# Patient Record
Sex: Female | Born: 1970
Health system: Southern US, Community
[De-identification: ages and names within clinical notes are randomized; demographics above are authoritative.]

## PROBLEM LIST (undated history)

## (undated) DIAGNOSIS — D649 Anemia, unspecified: Secondary | ICD-10-CM

## (undated) DIAGNOSIS — R51 Headache: Secondary | ICD-10-CM

## (undated) DIAGNOSIS — J309 Allergic rhinitis, unspecified: Secondary | ICD-10-CM

## (undated) DIAGNOSIS — I2699 Other pulmonary embolism without acute cor pulmonale: Secondary | ICD-10-CM

## (undated) DIAGNOSIS — L910 Hypertrophic scar: Secondary | ICD-10-CM

## (undated) DIAGNOSIS — J45909 Unspecified asthma, uncomplicated: Secondary | ICD-10-CM

## (undated) DIAGNOSIS — E538 Deficiency of other specified B group vitamins: Secondary | ICD-10-CM

## (undated) DIAGNOSIS — L259 Unspecified contact dermatitis, unspecified cause: Secondary | ICD-10-CM

## (undated) DIAGNOSIS — I82409 Acute embolism and thrombosis of unspecified deep veins of unspecified lower extremity: Secondary | ICD-10-CM

## (undated) DIAGNOSIS — E785 Hyperlipidemia, unspecified: Secondary | ICD-10-CM

## (undated) HISTORY — DX: Hyperlipidemia, unspecified: E78.5

## (undated) HISTORY — DX: Hypertrophic scar: L91.0

## (undated) HISTORY — DX: Deficiency of other specified B group vitamins: E53.8

## (undated) HISTORY — DX: Unspecified contact dermatitis, unspecified cause: L25.9

## (undated) HISTORY — DX: Allergic rhinitis, unspecified: J30.9

## (undated) HISTORY — PX: KELOID EXCISION: SHX1856

## (undated) HISTORY — DX: Unspecified asthma, uncomplicated: J45.909

## (undated) HISTORY — DX: Headache: R51

---
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
## 1998-05-06 ENCOUNTER — Inpatient Hospital Stay (HOSPITAL_COMMUNITY): Admission: AD | Admit: 1998-05-06 | Discharge: 1998-05-06 | Payer: Self-pay | Admitting: Obstetrics & Gynecology

## 1998-06-10 ENCOUNTER — Encounter: Payer: Self-pay | Admitting: Internal Medicine

## 1998-06-10 ENCOUNTER — Emergency Department (HOSPITAL_COMMUNITY): Admission: EM | Admit: 1998-06-10 | Discharge: 1998-06-10 | Payer: Self-pay | Admitting: Internal Medicine

## 1998-06-24 ENCOUNTER — Encounter: Admission: RE | Admit: 1998-06-24 | Discharge: 1998-07-20 | Payer: Self-pay | Admitting: *Deleted

## 1998-11-17 ENCOUNTER — Inpatient Hospital Stay (HOSPITAL_COMMUNITY): Admission: AD | Admit: 1998-11-17 | Discharge: 1998-11-17 | Payer: Self-pay | Admitting: Obstetrics

## 1998-11-17 ENCOUNTER — Encounter: Payer: Self-pay | Admitting: Obstetrics

## 1998-12-31 ENCOUNTER — Inpatient Hospital Stay (HOSPITAL_COMMUNITY): Admission: AD | Admit: 1998-12-31 | Discharge: 1998-12-31 | Payer: Self-pay | Admitting: Obstetrics and Gynecology

## 1999-01-03 DIAGNOSIS — R87810 Cervical high risk human papillomavirus (HPV) DNA test positive: Secondary | ICD-10-CM | POA: Insufficient documentation

## 1999-01-17 ENCOUNTER — Other Ambulatory Visit: Admission: RE | Admit: 1999-01-17 | Discharge: 1999-01-17 | Payer: Self-pay | Admitting: Obstetrics and Gynecology

## 1999-02-14 ENCOUNTER — Encounter: Payer: Self-pay | Admitting: Obstetrics and Gynecology

## 1999-02-14 ENCOUNTER — Ambulatory Visit (HOSPITAL_COMMUNITY): Admission: RE | Admit: 1999-02-14 | Discharge: 1999-02-14 | Payer: Self-pay | Admitting: Obstetrics and Gynecology

## 1999-04-25 ENCOUNTER — Other Ambulatory Visit: Admission: RE | Admit: 1999-04-25 | Discharge: 1999-04-25 | Payer: Self-pay | Admitting: Obstetrics and Gynecology

## 1999-05-13 ENCOUNTER — Inpatient Hospital Stay (HOSPITAL_COMMUNITY): Admission: AD | Admit: 1999-05-13 | Discharge: 1999-05-13 | Payer: Self-pay | Admitting: Obstetrics and Gynecology

## 1999-06-15 ENCOUNTER — Inpatient Hospital Stay (HOSPITAL_COMMUNITY): Admission: AD | Admit: 1999-06-15 | Discharge: 1999-06-15 | Payer: Self-pay | Admitting: Obstetrics and Gynecology

## 1999-06-23 ENCOUNTER — Inpatient Hospital Stay (HOSPITAL_COMMUNITY): Admission: AD | Admit: 1999-06-23 | Discharge: 1999-06-26 | Payer: Self-pay | Admitting: Obstetrics and Gynecology

## 1999-08-08 ENCOUNTER — Other Ambulatory Visit: Admission: RE | Admit: 1999-08-08 | Discharge: 1999-08-08 | Payer: Self-pay | Admitting: Obstetrics and Gynecology

## 2000-07-12 ENCOUNTER — Encounter: Payer: Self-pay | Admitting: Obstetrics & Gynecology

## 2000-07-12 ENCOUNTER — Inpatient Hospital Stay (HOSPITAL_COMMUNITY): Admission: AD | Admit: 2000-07-12 | Discharge: 2000-07-12 | Payer: Self-pay | Admitting: Obstetrics and Gynecology

## 2000-09-12 ENCOUNTER — Other Ambulatory Visit: Admission: RE | Admit: 2000-09-12 | Discharge: 2000-09-12 | Payer: Self-pay | Admitting: Obstetrics and Gynecology

## 2000-09-20 ENCOUNTER — Encounter: Payer: Self-pay | Admitting: Obstetrics and Gynecology

## 2000-09-20 ENCOUNTER — Ambulatory Visit (HOSPITAL_COMMUNITY): Admission: RE | Admit: 2000-09-20 | Discharge: 2000-09-20 | Payer: Self-pay | Admitting: Obstetrics and Gynecology

## 2001-01-25 ENCOUNTER — Inpatient Hospital Stay (HOSPITAL_COMMUNITY): Admission: AD | Admit: 2001-01-25 | Discharge: 2001-01-28 | Payer: Self-pay | Admitting: Obstetrics and Gynecology

## 2001-04-28 ENCOUNTER — Ambulatory Visit (HOSPITAL_BASED_OUTPATIENT_CLINIC_OR_DEPARTMENT_OTHER): Admission: RE | Admit: 2001-04-28 | Discharge: 2001-04-28 | Payer: Self-pay | Admitting: Internal Medicine

## 2001-10-03 ENCOUNTER — Emergency Department (HOSPITAL_COMMUNITY): Admission: EM | Admit: 2001-10-03 | Discharge: 2001-10-04 | Payer: Self-pay | Admitting: Emergency Medicine

## 2001-10-03 ENCOUNTER — Encounter: Payer: Self-pay | Admitting: Emergency Medicine

## 2002-03-08 ENCOUNTER — Emergency Department (HOSPITAL_COMMUNITY): Admission: EM | Admit: 2002-03-08 | Discharge: 2002-03-08 | Payer: Self-pay | Admitting: Emergency Medicine

## 2002-03-08 ENCOUNTER — Encounter: Payer: Self-pay | Admitting: Emergency Medicine

## 2002-03-09 ENCOUNTER — Emergency Department (HOSPITAL_COMMUNITY): Admission: EM | Admit: 2002-03-09 | Discharge: 2002-03-09 | Payer: Self-pay | Admitting: Emergency Medicine

## 2002-03-12 ENCOUNTER — Encounter (HOSPITAL_BASED_OUTPATIENT_CLINIC_OR_DEPARTMENT_OTHER): Admission: RE | Admit: 2002-03-12 | Discharge: 2002-03-28 | Payer: Self-pay | Admitting: Orthopedic Surgery

## 2002-07-27 ENCOUNTER — Emergency Department (HOSPITAL_COMMUNITY): Admission: EM | Admit: 2002-07-27 | Discharge: 2002-07-27 | Payer: Self-pay | Admitting: Emergency Medicine

## 2003-02-07 ENCOUNTER — Emergency Department (HOSPITAL_COMMUNITY): Admission: EM | Admit: 2003-02-07 | Discharge: 2003-02-07 | Payer: Self-pay | Admitting: Emergency Medicine

## 2003-07-27 ENCOUNTER — Emergency Department (HOSPITAL_COMMUNITY): Admission: EM | Admit: 2003-07-27 | Discharge: 2003-07-27 | Payer: Self-pay | Admitting: Emergency Medicine

## 2003-07-31 ENCOUNTER — Ambulatory Visit (HOSPITAL_COMMUNITY): Admission: RE | Admit: 2003-07-31 | Discharge: 2003-07-31 | Payer: Self-pay | Admitting: Internal Medicine

## 2003-11-03 ENCOUNTER — Ambulatory Visit: Payer: Self-pay | Admitting: Internal Medicine

## 2004-11-18 ENCOUNTER — Emergency Department (HOSPITAL_COMMUNITY): Admission: EM | Admit: 2004-11-18 | Discharge: 2004-11-18 | Payer: Self-pay | Admitting: Emergency Medicine

## 2004-11-22 ENCOUNTER — Ambulatory Visit: Payer: Self-pay | Admitting: Internal Medicine

## 2005-01-18 ENCOUNTER — Ambulatory Visit: Payer: Self-pay | Admitting: Internal Medicine

## 2007-09-24 ENCOUNTER — Inpatient Hospital Stay (HOSPITAL_COMMUNITY): Admission: AD | Admit: 2007-09-24 | Discharge: 2007-09-24 | Payer: Self-pay | Admitting: Obstetrics and Gynecology

## 2007-10-30 ENCOUNTER — Ambulatory Visit: Payer: Self-pay | Admitting: Obstetrics and Gynecology

## 2007-10-30 ENCOUNTER — Encounter: Payer: Self-pay | Admitting: Obstetrics and Gynecology

## 2007-10-30 LAB — CONVERTED CEMR LAB: Hep B S Ab: NEGATIVE

## 2007-11-04 ENCOUNTER — Ambulatory Visit (HOSPITAL_COMMUNITY): Admission: RE | Admit: 2007-11-04 | Discharge: 2007-11-04 | Payer: Self-pay | Admitting: Obstetrics and Gynecology

## 2008-07-11 ENCOUNTER — Inpatient Hospital Stay (HOSPITAL_COMMUNITY): Admission: AD | Admit: 2008-07-11 | Discharge: 2008-07-12 | Payer: Self-pay | Admitting: Obstetrics and Gynecology

## 2008-11-23 ENCOUNTER — Inpatient Hospital Stay (HOSPITAL_COMMUNITY): Admission: AD | Admit: 2008-11-23 | Discharge: 2008-11-25 | Payer: Self-pay | Admitting: Obstetrics and Gynecology

## 2008-12-02 ENCOUNTER — Ambulatory Visit: Payer: Self-pay | Admitting: Vascular Surgery

## 2009-03-17 ENCOUNTER — Ambulatory Visit: Payer: Self-pay | Admitting: Internal Medicine

## 2009-03-17 DIAGNOSIS — R5381 Other malaise: Secondary | ICD-10-CM | POA: Insufficient documentation

## 2009-03-17 DIAGNOSIS — L259 Unspecified contact dermatitis, unspecified cause: Secondary | ICD-10-CM

## 2009-03-17 DIAGNOSIS — M5137 Other intervertebral disc degeneration, lumbosacral region: Secondary | ICD-10-CM | POA: Insufficient documentation

## 2009-03-17 DIAGNOSIS — R5383 Other fatigue: Secondary | ICD-10-CM

## 2009-03-17 DIAGNOSIS — E785 Hyperlipidemia, unspecified: Secondary | ICD-10-CM

## 2009-03-17 DIAGNOSIS — J3089 Other allergic rhinitis: Secondary | ICD-10-CM | POA: Insufficient documentation

## 2009-03-17 DIAGNOSIS — J45909 Unspecified asthma, uncomplicated: Secondary | ICD-10-CM | POA: Insufficient documentation

## 2009-03-17 DIAGNOSIS — D649 Anemia, unspecified: Secondary | ICD-10-CM | POA: Insufficient documentation

## 2009-03-17 DIAGNOSIS — R030 Elevated blood-pressure reading, without diagnosis of hypertension: Secondary | ICD-10-CM | POA: Insufficient documentation

## 2009-03-17 DIAGNOSIS — J309 Allergic rhinitis, unspecified: Secondary | ICD-10-CM

## 2009-03-17 DIAGNOSIS — R42 Dizziness and giddiness: Secondary | ICD-10-CM | POA: Insufficient documentation

## 2009-03-17 HISTORY — DX: Hyperlipidemia, unspecified: E78.5

## 2009-03-17 HISTORY — DX: Unspecified contact dermatitis, unspecified cause: L25.9

## 2009-03-17 HISTORY — DX: Unspecified asthma, uncomplicated: J45.909

## 2009-03-17 HISTORY — DX: Allergic rhinitis, unspecified: J30.9

## 2009-03-18 DIAGNOSIS — E538 Deficiency of other specified B group vitamins: Secondary | ICD-10-CM | POA: Insufficient documentation

## 2009-03-18 HISTORY — DX: Deficiency of other specified B group vitamins: E53.8

## 2009-03-18 LAB — CONVERTED CEMR LAB
ALT: 13 units/L (ref 0–35)
Alkaline Phosphatase: 57 units/L (ref 39–117)
Basophils Absolute: 0 10*3/uL (ref 0.0–0.1)
Basophils Relative: 0.8 % (ref 0.0–3.0)
Bilirubin Urine: NEGATIVE
Chloride: 107 meq/L (ref 96–112)
Creatinine, Ser: 0.8 mg/dL (ref 0.4–1.2)
Eosinophils Absolute: 0.2 10*3/uL (ref 0.0–0.7)
GFR calc non Af Amer: 103.14 mL/min (ref >60)
HCT: 36.7 % (ref 36.0–46.0)
Hemoglobin, Urine: NEGATIVE
Hemoglobin: 11.9 g/dL — ABNORMAL LOW (ref 12.0–15.0)
Iron: 51 ug/dL (ref 42–145)
Ketones, ur: NEGATIVE mg/dL
Lymphs Abs: 2.3 10*3/uL (ref 0.7–4.0)
MCHC: 32.5 g/dL (ref 30.0–36.0)
MCV: 90.4 fL (ref 78.0–100.0)
Neutro Abs: 2.1 10*3/uL (ref 1.4–7.7)
Platelets: 177 10*3/uL (ref 150.0–400.0)
Potassium: 3.9 meq/L (ref 3.5–5.1)
RBC: 4.06 M/uL (ref 3.87–5.11)
Saturation Ratios: 17.2 % — ABNORMAL LOW (ref 20.0–50.0)
Sodium: 140 meq/L (ref 135–145)
TSH: 1.38 microintl units/mL (ref 0.35–5.50)
Transferrin: 212.4 mg/dL (ref 212.0–360.0)
Urine Glucose: NEGATIVE mg/dL

## 2009-03-22 ENCOUNTER — Ambulatory Visit: Payer: Self-pay | Admitting: Internal Medicine

## 2009-04-05 ENCOUNTER — Ambulatory Visit: Payer: Self-pay | Admitting: Internal Medicine

## 2009-04-19 ENCOUNTER — Ambulatory Visit: Payer: Self-pay | Admitting: Internal Medicine

## 2009-06-02 ENCOUNTER — Ambulatory Visit: Payer: Self-pay | Admitting: Internal Medicine

## 2009-06-16 ENCOUNTER — Ambulatory Visit: Payer: Self-pay | Admitting: Internal Medicine

## 2009-06-21 ENCOUNTER — Ambulatory Visit: Payer: Self-pay | Admitting: Internal Medicine

## 2009-06-21 DIAGNOSIS — R51 Headache: Secondary | ICD-10-CM

## 2009-06-21 DIAGNOSIS — R519 Headache, unspecified: Secondary | ICD-10-CM | POA: Insufficient documentation

## 2009-06-21 DIAGNOSIS — L989 Disorder of the skin and subcutaneous tissue, unspecified: Secondary | ICD-10-CM | POA: Insufficient documentation

## 2009-06-21 DIAGNOSIS — L910 Hypertrophic scar: Secondary | ICD-10-CM

## 2009-06-21 DIAGNOSIS — J069 Acute upper respiratory infection, unspecified: Secondary | ICD-10-CM | POA: Insufficient documentation

## 2009-06-21 HISTORY — DX: Headache: R51

## 2009-06-21 HISTORY — DX: Hypertrophic scar: L91.0

## 2009-06-24 ENCOUNTER — Encounter (INDEPENDENT_AMBULATORY_CARE_PROVIDER_SITE_OTHER): Payer: Self-pay | Admitting: *Deleted

## 2009-09-01 ENCOUNTER — Encounter: Payer: Self-pay | Admitting: Internal Medicine

## 2009-10-06 ENCOUNTER — Encounter: Payer: Self-pay | Admitting: Internal Medicine

## 2009-10-06 ENCOUNTER — Ambulatory Visit: Payer: Self-pay | Admitting: Internal Medicine

## 2009-10-18 ENCOUNTER — Ambulatory Visit: Payer: Self-pay | Admitting: Internal Medicine

## 2009-10-22 ENCOUNTER — Telehealth: Payer: Self-pay | Admitting: Internal Medicine

## 2009-11-15 ENCOUNTER — Telehealth: Payer: Self-pay | Admitting: Internal Medicine

## 2010-01-22 ENCOUNTER — Encounter: Payer: Self-pay | Admitting: Internal Medicine

## 2010-01-23 ENCOUNTER — Encounter: Payer: Self-pay | Admitting: *Deleted

## 2010-02-01 NOTE — Progress Notes (Signed)
  Phone Note Call from Patient Call back at Work Phone [phone]   Caller: Patient Call For: Corwin Levins MD Summary of Call: Pt stated that her insurance denied the MRI and she was wondering what is the next step or what else Dr. Jonny Ruiz can do about this? Please give pt a call.  Initial call taken by: Livingston Diones,  November 15, 2009 3:38 PM  Follow-up for Phone Call        I can refer to neurology if she wants? Follow-up by: Corwin Levins MD,  November 15, 2009 3:44 PM  Additional Follow-up for Phone Call Additional follow up Details #1::        Pt states that she is still experiencing head pain and would like referral to Neuro. Pt informed she will be contacted by [facility] with appt info Additional Follow-up by: Margaret Pyle, CMA,  November 16, 2009 8:35 AM

## 2010-02-01 NOTE — Assessment & Plan Note (Signed)
Summary: sharp pains in front of head-lb   Vital Signs:  Patient profile:   40 year old female Height:      67 inches Weight:      252.75 pounds BMI:     39.73 O2 Sat:      98 % on Room air Temp:     97.6 degrees F oral Pulse rate:   79 / minute BP sitting:   120 / 80  (left arm) Cuff size:   large  Vitals Entered By: Zella Ball Ewing CMA (AAMA) (October 06, 2009 10:29 AM)  O2 Flow:  Room air CC: Sharp head pain/RE   CC:  Sharp head pain/RE.  History of Present Illness: here to f/u - unfort has gained abou 16 lbs since last visit;  c/o 3 wks, mod to severe intermittent left forehead pain, happens about twice per day, lasts few seconds but really bothers her;  no bluurred vision but does have some dizziness with it,  no photophobia, n/v, phonophobia, ;  seems to not be related to exertion;  no syncope or falls , or injury. Pt denies new neuro symptoms such as other headache, facial or extremity weakness .  Pt denies CP, worsening sob, doe, wheezing, orthopnea, pnd, worsening LE edema, palps, dizziness or syncope  No ST , cough.  No fever, wt loss, night sweats, loss of appetite or other constitutional symptoms  Does also have signficant fatigue for approx 4 wks, but denies daytime somnolence.  Denies depressivve symtpoms, or suicidal  ideiaton. no panic.  Due for b12 shot today. No vert bleeding or bruising, no menorrhagia,  has IUD - should not be pregnant.  Only 2 menses since partuition - 10 mo.  Does have GYN but not seen since 6 wk checkup, except for the IUD.  Problems Prior to Update: 1)  Headache  (ICD-784.0) 2)  Skin Lesion  (ICD-709.9) 3)  Keloid  (ICD-701.4) 4)  Dizziness  (ICD-780.4) 5)  Uri  (ICD-465.9) 6)  Vitamin B12 Deficiency  (ICD-266.2) 7)  Elevated Blood Pressure Without Diagnosis of Hypertension  (ICD-796.2) 8)  Disc Disease, Lumbar  (ICD-722.52) 9)  Hyperlipidemia  (ICD-272.4) 10)  Eczema  (ICD-692.9) 11)  Asthma  (ICD-493.90) 12)  Allergic Rhinitis   (ICD-477.9) 13)  Fatigue  (ICD-780.79) 14)  Dizziness  (ICD-780.4) 15)  Anemia-nos  (ICD-285.9)  Medications Prior to Update: 1)  Cyanocobalamin 1000 Mcg/ml Soln (Cyanocobalamin) .Marland Kitchen.. 1 Cc Im Q Month 2)  Triamcinolone Acetonide 0.1 % Crea (Triamcinolone Acetonide) .... Use Asd Once Daily As Needed 3)  Azithromycin 250 Mg Tabs (Azithromycin) .... 2po Qd For 1 Day, Then 1po Qd For 4days, Then Stop 4)  Meclizine Hcl 12.5 Mg Tabs (Meclizine Hcl) .Marland Kitchen.. 1po Q 6 Hrs As Needed Dizziness  Current Medications (verified): 1)  Cyanocobalamin 1000 Mcg/ml Soln (Cyanocobalamin) .Marland Kitchen.. 1 Cc Im Q Month 2)  Fexofenadine Hcl 180 Mg Tabs (Fexofenadine Hcl) .Marland Kitchen.. 1po Once Daily As Needed Allergies  Allergies (verified): 1)  ! 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   Head:  normocephalic and atraumatic.   Eyes:  vision grossly intact, pupils equal, and pupils round.   Ears:  bilat tm's with mild erythema., non bulging, canals clear Nose:  nasal dischargemucosal pallor and mucosal edema.   Mouth:  pharyngeal erythema and fair dentition.   Neck:  supple and no masses.   Lungs:  normal respiratory effort and normal breath sounds.   Heart:  normal rate and regular rhythm.   Abdomen:  soft, non-tender, and normal bowel sounds.   Msk:  no joint tenderness and no joint swelling.   Extremities:  no edema, no erythema  Neurologic:  cranial nerves II-XII  intact, strength normal in all extremities, sensation intact to light touch, and gait normal.   Skin:  color normal and no rashes.   Psych:  ? mild dysphoric, mild anxious   Impression & Recommendations:  Problem # 1:  HEADACHE (ICD-784.0) hx not overly concerning given the very limited symptom duration, exam o/w benign, prob tension type HA/MSK  - ok for tylenol as needed   Problem # 2:  ALLERGIC RHINITIS (ICD-477.9)  Her updated medication list for this problem includes:    Fexofenadine Hcl 180 Mg Tabs (Fexofenadine hcl) .Marland Kitchen... 1po once daily as needed allergies treat as above, f/u any worsening signs or symptoms   Problem # 3:  FATIGUE (ICD-780.79)  for ecg today, labs from last visit reveiwed;  exam benign, and no symtpoms or OSA or depression  - ok to follow for now  Orders: EKG w/ Interpretation (93000)  Problem # 4:  VITAMIN B12 DEFICIENCY (ICD-266.2)  for  b12 IM today per routine  Orders: Vit B12 1000 mcg (J3420) Admin of Therapeutic Inj  intramuscular or subcutaneous (78295)  Complete Medication List: 1)  Cyanocobalamin 1000 Mcg/ml Soln (Cyanocobalamin) .Marland Kitchen.. 1 cc im q month 2)  Fexofenadine Hcl 180 Mg Tabs (Fexofenadine hcl) .Marland Kitchen.. 1po once daily as needed allergies  Patient Instructions: 1)  please tylenol for pain 2)  Please take all new medications as prescribed - the generic allegra for the allergies 3)  Your EKG was OK today 4)  You had the B12 shot today 5)  Continue all previous medications as before this visit  6)  Please schedule a follow-up appointment in 6 months, or sooner if needed Prescriptions: FEXOFENADINE HCL 180 MG TABS (FEXOFENADINE HCL) 1po once daily as needed allergies  #30 x 11   Entered and Authorized by:   Corwin Levins MD   Signed by:   Corwin Levins MD on 10/06/2009   Method used:   Print then Give to Patient   RxID:   [id number]    Medication Administration  Injection # 1:    Medication: Vit B12 1000 mcg    Diagnosis:  VITAMIN B12 DEFICIENCY (ICD-266.2)    Route: IM    Site: R deltoid    Exp Date: 07/2011    Lot #: [id number]    Mfr: American Regent    Patient tolerated injection without complications    Given by: Zella Ball Ewing CMA (AAMA) (October 06, 2009 11:02 AM)  Orders Added: 1)  EKG w/ Interpretation [93000] 2)  Vit B12 1000 mcg [J3420] 3)  Admin of Therapeutic Inj  intramuscular or subcutaneous [96372] 4)  Est. Patient Level IV [52841]

## 2010-02-01 NOTE — Consult Note (Signed)
Summary: [facility]   Imported By: Sherian Rein 09/23/2009 09:56:33  _____________________________________________________________________  External Attachment:    Type:   Image     Comment:   External Document

## 2010-02-01 NOTE — Assessment & Plan Note (Signed)
Summary: SHARP PAIN IN HEAD STILL/NWS   Vital Signs:  Patient profile:   40 year old female Height:      67 inches Weight:      249.38 pounds BMI:     39.20 O2 Sat:      98 % on Room air Temp:     98.3 degrees F oral Pulse rate:   56 / minute BP sitting:   100 / 68  (left arm) Cuff size:   large  Vitals Entered By: Zella Ball Ewing CMA Duncan Dull) (October 18, 2009 3:39 PM)  O2 Flow:  Room air CC: Sharp pain in head/RE   CC:  Sharp pain in head/RE.  History of Present Illness: here with persistent pain to left frontal head area as per last visit, but now more freq, more severe, now debilitating, makes her stop functioning well for minutes, severe episode 3 days ago, but still mod to severe last 3 days;  with some blurred vision ; now feels like a "lightening bolt" when flares hit her;  had hydrocodone 10's that did not help in the last 3 days that she had left over from a dental procedure.;  no fever, fall, injury or other new specific neuro symtpoms such as facial or extremity weakness.  No dizziness or syncope.  Pt denies CP, worsening sob, doe, wheezing, orthopnea, pnd, worsening LE edema, palps, dizziness or syncope No fever, wt loss, night sweats, loss of appetite or other constitutional symptoms .  No aggrev or alleviating facotr except ? worse to jerk the head to the left?  Problems Prior to Update: 1)  Headache  (ICD-784.0) 2)  Skin Lesion  (ICD-709.9) 3)  Keloid  (ICD-701.4) 4)  Dizziness  (ICD-780.4) 5)  Uri  (ICD-465.9) 6)  Vitamin B12 Deficiency  (ICD-266.2) 7)  Elevated Blood Pressure Without Diagnosis of Hypertension  (ICD-796.2) 8)  Disc Disease, Lumbar  (ICD-722.52) 9)  Hyperlipidemia  (ICD-272.4) 10)  Eczema  (ICD-692.9) 11)  Asthma  (ICD-493.90) 12)  Allergic Rhinitis  (ICD-477.9) 13)  Fatigue  (ICD-780.79) 14)  Dizziness  (ICD-780.4) 15)  Anemia-nos  (ICD-285.9)  Medications Prior to Update: 1)  Cyanocobalamin 1000 Mcg/ml Soln (Cyanocobalamin) .Marland Kitchen.. 1 Cc Im Q  Month 2)  Fexofenadine Hcl 180 Mg Tabs (Fexofenadine Hcl) .Marland Kitchen.. 1po Once Daily As Needed Allergies  Current Medications (verified): 1)  Cyanocobalamin 1000 Mcg/ml Soln (Cyanocobalamin) .Marland Kitchen.. 1 Cc Im Q Month 2)  Fexofenadine Hcl 180 Mg Tabs (Fexofenadine Hcl) .Marland Kitchen.. 1po Once Daily As Needed Allergies 3)  Flexeril 5 Mg Tabs (Cyclobenzaprine Hcl) .Marland Kitchen.. 1 By Mouth Three Times A Day As Needed  Allergies (verified): 1)  ! 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   Head:  normocephalic and atraumatic.  , nontender scalp, forehead and temple Eyes:  vision grossly intact, pupils equal, and pupils round.   Ears:  R ear normal and L ear normal.   Nose:  no external deformity and no nasal discharge.   Mouth:  no gingival  abnormalities and pharynx pink and moist.   Neck:  supple and no masses.   Lungs:  normal respiratory effort and normal breath sounds.   Heart:  normal rate and regular rhythm.   Extremities:  no edema, no erythema  Neurologic:  alert & oriented X3, cranial nerves II-XII intact, strength normal in all extremities, sensation intact to light touch, and gait normal.     Impression & Recommendations:  Problem # 1:  HEADACHE (ICD-784.0)  overall much more freq and severe, left head/forehead area, now debilitating when pain occurs, with some blurred vision;  different from her usual migraine and not responsive to the hydrocodone 10  high dose - ? sentinel headache with aneurysm - for MRI/MRA, also r/o tumor  Orders: Radiology Referral (Radiology)  Problem # 2:  URI (ICD-465.9)  Her updated medication list for this problem includes:    Fexofenadine Hcl 180 Mg Tabs (Fexofenadine hcl) .Marland Kitchen... 1po once daily as needed allergies resolved, reassured  Problem # 3:  ALLERGIC RHINITIS (ICD-477.9)  Her updated medication list for this problem includes:    Fexofenadine Hcl 180 Mg Tabs (Fexofenadine hcl) .Marland Kitchen... 1po once daily as needed allergies treat as above, f/u any worsening signs or symptoms , doubt related to current symptoms  Complete Medication List: 1)  Cyanocobalamin 1000 Mcg/ml Soln (Cyanocobalamin) .Marland Kitchen.. 1 cc im q month 2)  Fexofenadine Hcl 180 Mg Tabs (Fexofenadine hcl) .Marland Kitchen.. 1po once daily as needed allergies 3)  Flexeril 5 Mg Tabs (Cyclobenzaprine hcl) .Marland Kitchen.. 1 by mouth three times a day as needed  Patient Instructions: 1)  Please take all new medications as prescribed 2)  /Continue all previous medications as before this visit 3)  You will be contacted about the referral(s) to: MRI and MRA of the head/brain 4)  Please schedule a follow-up appointment as needed. Prescriptions: FLEXERIL 5 MG TABS (CYCLOBENZAPRINE HCL) 1 by mouth three times a day as needed  #60 x 1   Entered and Authorized by:   Corwin Levins MD   Signed by:   Corwin Levins MD on 10/18/2009   Method used:   Electronically to        [facility]  [address] [phone]* (retail)       [address]       [place], [place]  [id number]       Ph: [phone] or [phone]       Fax: [phone]   RxID:   [id number]    Orders Added: 1)  Radiology Referral [Radiology] 2)  Est. Patient Level IV [62376]

## 2010-02-01 NOTE — Assessment & Plan Note (Signed)
Summary: 1ST B-12 / Sammuel Cooper Natale Milch   Nurse Visit   Allergies: 1)  ! Sulfa  Medication Administration  Injection # 1:    Medication: Vit B12 1000 mcg    Diagnosis: VITAMIN B12 DEFICIENCY (ICD-266.2)    Route: IM    Site: L deltoid    Exp Date: 10/03/2010    Lot #: [id number]    Mfr: American Regent    Patient tolerated injection without complications    Given by: Lucious Groves (March 22, 2009 2:54 PM)  Orders Added: 1)  Vit B12 1000 mcg [J3420] 2)  Admin of Therapeutic Inj  intramuscular or subcutaneous [19147]

## 2010-02-01 NOTE — Assessment & Plan Note (Signed)
Summary: b12 inject-lb   Nurse Visit   Allergies: 1)  ! Sulfa  Medication Administration  Injection # 1:    Medication: Vit B12 1000 mcg    Diagnosis: VITAMIN B12 DEFICIENCY (ICD-266.2)    Route: IM    Site: R deltoid    Exp Date: 10/03/2010    Lot #: [id number]    Mfr: American Regent    Patient tolerated injection without complications    Given by: Sydell Axon, SMA (April 05, 2009 10:36 AM)  Orders Added: 1)  Admin of Therapeutic Inj  intramuscular or subcutaneous [96372] 2)  Vit B12 1000 mcg [J3420]   Medication Administration  Injection # 1:    Medication: Vit B12 1000 mcg    Diagnosis: VITAMIN B12 DEFICIENCY (ICD-266.2)    Route: IM    Site: R deltoid    Exp Date: 10/03/2010    Lot #: [id number]    Mfr: American Regent    Patient tolerated injection without complications    Given by: Sydell Axon, SMA (April 05, 2009 10:36 AM)  Orders Added: 1)  Admin of Therapeutic Inj  intramuscular or subcutaneous [96372] 2)  Vit B12 1000 mcg [J3420]  Appended Document: b12 inject-lb     Nurse Visit   Vital Signs:  Patient profile:   40 year old female BP sitting:   124 / 82  (left arm) Cuff size:   regular  Allergies: 1)  ! Sulfa  Orders Added: 1)  Est. Patient Level I [60109]

## 2010-02-01 NOTE — Assessment & Plan Note (Signed)
Summary: B12Marland Kitchen Kerri Perches Natale Milch   Nurse Visit   Allergies: 1)  ! Sulfa  Medication Administration  Injection # 1:    Medication: Vit B12 1000 mcg    Diagnosis: VITAMIN B12 DEFICIENCY (ICD-266.2)    Route: IM    Site: L deltoid    Exp Date: 11/03/2010    Lot #: [id number]    Mfr: American Regent    Patient tolerated injection without complications    Given by: Margaret Pyle, CMA (June 02, 2009 11:39 AM)  Orders Added: 1)  Admin of Therapeutic Inj  intramuscular or subcutaneous [96372] 2)  Vit B12 1000 mcg [J3420]

## 2010-02-01 NOTE — Letter (Signed)
Summary: [facility] Consult Scheduled Letter  [facility]  [address] [place], [place] [address]   Phone: [phone]  Fax: [phone]      06/24/2009 MRN: [id number]  GUADALUPE KEREKES [address] [place], [place]  [address]    Dear Ms. Andrey Campanile,      We have scheduled an appointment for you.  At the recommendation of Dr.James, we have scheduled you a consult with Dr Jonny Ruiz on 09/01/09 at 9:30am.  Their phone number is [phone].  If this appointment day and time is not convenient for you, please feel free to call the office of the doctor you are being referred to at the number listed above and reschedule the appointment.     [facility] [address] [place], [place] [address]    Thank you,  Patient Care Coordinator [facility]

## 2010-02-01 NOTE — Progress Notes (Signed)
  Phone Note From Other Clinic   Caller: Referral Coordinator [facility] [phone] Call For: Dr Jonny Ruiz Details for Reason: Pt test was denied Summary of Call: Pt Mri/MRA was deniied by insurance so [facility] will cancel her Appt . pls advise  Initial call taken by: Shelbie Proctor,  October 22, 2009 8:38 AM  Follow-up for Phone Call        noted Follow-up by: Corwin Levins MD,  October 22, 2009 9:23 AM

## 2010-02-01 NOTE — Assessment & Plan Note (Signed)
Summary: NEW TO RE-EST/ LOV 2007/ REG MEDICAID/ OK'D BY DR / ELEV ...   Vital Signs:  Patient profile:   40 year old female Height:      67 inches Weight:      236.13 pounds BMI:     37.12 O2 Sat:      99 % on Room air Temp:     98.4 degrees F oral Pulse rate:   59 / minute BP sitting:   120 / 76  (left arm) Cuff size:   large  Vitals Entered ByZella Ball Ewing (March 17, 2009 3:28 PM)  O2 Flow:  Room air CC: New pt, re-establish, BP problems/RE   CC:  New pt, re-establish, and BP problems/RE.  History of Present Illness: here post partum 10 wks , had documented elev BP several times since then,  also with recent dizziness every few days described both as vertigo and lightheadedness,  and  BP 138/103 yesterday twice at the drug store near her house; Pt denies CP, sob, doe, wheezing, orthopnea, pnd, worsening LE edema, palps, or syncope..  no obvious fever, bleeding, n/v, diarrhea.  Had IUD done 8 wks ago per GYN- BP at that time still elevated.  Had URI last wk with pain, congestion resolving.    Preventive Screening-Counseling & Management  Alcohol-Tobacco     Smoking Status: quit      Drug Use:  no.    Problems Prior to Update: 1)  Disc Disease, Lumbar  (ICD-722.52) 2)  Hyperlipidemia  (ICD-272.4) 3)  Eczema  (ICD-692.9) 4)  Asthma  (ICD-493.90) 5)  Allergic Rhinitis  (ICD-477.9) 6)  Fatigue  (ICD-780.79) 7)  Dizziness  (ICD-780.4) 8)  Anemia-nos  (ICD-285.9)  Medications Prior to Update: 1)  None  Current Medications (verified): 1)  None  Allergies (verified): 1)  ! 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 father with prostate cancer, HTN grandparent with lung cancer, heart disease, DM several both sides famly with ETOH - 3 uncles p-aunt with ovary cancer sister with allergies  Social History: Reviewed history and no changes required. 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   Head:  normocephalic and atraumatic.   Eyes:  vision grossly intact, pupils equal, and pupils round.   Ears:  bilat tm's mild erythema, sinus nontender Nose:  no external deformity and no nasal discharge.   Mouth:  no gingival abnormalities and pharynx pink and moist.   Neck:  supple and no masses.   Lungs:  normal respiratory effort and normal breath sounds.   Heart:  normal rate and regular rhythm.   Abdomen:  soft, non-tender, and normal bowel sounds.   Msk:  no joint tenderness and  no joint swelling.   Extremities:  no edema, no erythema  Neurologic:  alert & oriented X3.     Impression & Recommendations:  Problem # 1:  DIZZINESS (ICD-780.4) ? etiology; ? inner ear related with recetn URI, and/or related to persistent anemia or other;  exam o/w benign;  hold mecllziine due to breast feeding for now; can use mucinex otc as needed;  also check labs; declines ecg  Problem # 2:  ANEMIA-NOS (ICD-285.9) last hgb nov 2010 8.7 on echart review - to re-check today  Orders: TLB-IBC Pnl (Iron/FE;Transferrin) (83550-IBC) TLB-B12 + Folate Pnl (82746_82607-B12/FOL)  Problem # 3:  FATIGUE  (ICD-780.79) exam benign, to check labs below; follow with expectant management  Orders: TLB-BMP (Basic Metabolic Panel-BMET) (80048-METABOL) TLB-CBC Platelet - w/Differential (85025-CBCD) TLB-Hepatic/Liver Function Pnl (80076-HEPATIC) TLB-TSH (Thyroid Stimulating Hormone) (84443-TSH) TLB-Udip ONLY (81003-UDIP) TLB-Sedimentation Rate (ESR) (85652-ESR)  Problem # 4:  ELEVATED BLOOD PRESSURE WITHOUT DIAGNOSIS OF HYPERTENSION (ICD-796.2) although she does have significant overwt, BP today is quite normal;  suspect she has had some lessening BP as is common post partum, as well possibly mild reactive due to recent URI and/or dizziness;  ok to follow for now, to cont to monitor for evidence sustained BP elev   Patient Instructions: 1)  You can also use Mucinex OTC or it's generic for congestion  2)  Please go to the Lab in the basement for your blood and/or urine tests today  3)  please call the number on the blue card for results 4)  please return for further testing is the symtpoms keep going on

## 2010-02-01 NOTE — Assessment & Plan Note (Signed)
Summary: FU--DIZZINESS--KELOID/FACE--STC   Vital Signs:  Patient profile:   40 year old female Height:      67 inches Weight:      238 pounds BMI:     37.41 O2 Sat:      98 % on Room air Temp:     98.8 degrees F oral Pulse rate:   56 / minute BP sitting:   100 / 62  (left arm) Cuff size:   large  Vitals Entered ByMarland Kitchen Zella Ball Ewing (June 21, 2009 2:24 PM)  O2 Flow:  Room air CC: followup, dizziness/RE   CC:  followup and dizziness/RE.  History of Present Illness: here with recurrent dizzy spells for 7 mo since  post partum;  mild, not light headed but more off balance, seems random, non positional, not usually assoc with HA although has had several headaches in the past 2 wks - bifrontal, pressure like, no photophobia or phonophobia; no fever, ST, sinus pain or pressure, cough;  no Pt denies CP, sob, doe, wheezing, orthopnea, pnd, worsening LE edema, palps, or syncope .  Pt denies new neuro symptoms such as facial or extremity weakness . Mar 2011 labs reviewed with pt.  Also c/o pain to the right facial keloid ongoing and reqeusts referral.  Also a small bump area to the left upper lip is nontender and not hard but increased in size the past few wks as well.    Problems Prior to Update: 1)  Skin Lesion  (ICD-709.9) 2)  Keloid  (ICD-701.4) 3)  Dizziness  (ICD-780.4) 4)  Uri  (ICD-465.9) 5)  Vitamin B12 Deficiency  (ICD-266.2) 6)  Elevated Blood Pressure Without Diagnosis of Hypertension  (ICD-796.2) 7)  Disc Disease, Lumbar  (ICD-722.52) 8)  Hyperlipidemia  (ICD-272.4) 9)  Eczema  (ICD-692.9) 10)  Asthma  (ICD-493.90) 11)  Allergic Rhinitis  (ICD-477.9) 12)  Fatigue  (ICD-780.79) 13)  Dizziness  (ICD-780.4) 14)  Anemia-nos  (ICD-285.9)  Medications Prior to Update: 1)  Cyanocobalamin 1000 Mcg/ml Soln (Cyanocobalamin) .Marland Kitchen.. 1 Cc Im Q Month 2)  Triamcinolone Acetonide 0.1 % Crea (Triamcinolone Acetonide) .... Use Asd Once Daily As Needed  Current Medications (verified): 1)   Cyanocobalamin 1000 Mcg/ml Soln (Cyanocobalamin) .Marland Kitchen.. 1 Cc Im Q Month 2)  Triamcinolone Acetonide 0.1 % Crea (Triamcinolone Acetonide) .... Use Asd Once Daily As Needed 3)  Azithromycin 250 Mg Tabs (Azithromycin) .... 2po Qd For 1 Day, Then 1po Qd For 4days, Then Stop 4)  Meclizine Hcl 12.5 Mg Tabs (Meclizine Hcl) .Marland Kitchen.. 1po Q 6 Hrs As Needed Dizziness  Allergies (verified): 1)  ! 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 , mild ill  Head:  normocephalic and atraumatic.   Eyes:  vision grossly intact, pupils equal, and pupils round.   Ears:  bilat tm's red, r> left Nose:  nasal dischargemucosal pallor and mucosal edema.   Mouth:  pharyngeal erythema and fair dentition.   Neck:  supple and cervical lymphadenopathy.   Lungs:  normal respiratory effort  and normal breath sounds.   Heart:  normal rate and regular rhythm.   Msk:  spine nontender Extremities:  no edema, no erythema  Neurologic:  cranial nerves II-XII intact and strength normal in all extremities.   Skin:  tender keloid to right cheek;  also small nondiscrete swollen area to left area lip nontender - ? small lipoma like approx 10 mm area Psych:  not depressed appearing and slightly anxious.     Impression & Recommendations:  Problem # 1:  URI (ICD-465.9) for antibx  - f/u any worsening s/s  Problem # 2:  DIZZINESS (ICD-780.4)  Her updated medication list for this  problem includes:    Meclizine Hcl 12.5 Mg Tabs (Meclizine hcl) .Marland Kitchen... 1po q 6 hrs as needed dizziness treat as above, f/u any worsening signs or symptoms   Problem # 3:  KELOID (ICD-701.4)  right cheek - tender it seems - to derm for ? steroid tx  Orders: Dermatology Referral (Derma)  Problem # 4:  SKIN LESION (ICD-709.9) left upper lip - ? cystic - most likely it seems - benign appearing, ok to follow, although she could ask dermatology opinion as well   Problem # 5:  HEADACHE (ICD-784.0) likely relate to URI vs tension type ; for tylenl as needed   Complete Medication List: 1)  Cyanocobalamin 1000 Mcg/ml Soln (Cyanocobalamin) .Marland Kitchen.. 1 cc im q month 2)  Triamcinolone Acetonide 0.1 % Crea (Triamcinolone acetonide) .... Use asd once daily as needed 3)  Azithromycin 250 Mg Tabs (Azithromycin) .... 2po qd for 1 day, then 1po qd for 4days, then stop 4)  Meclizine Hcl 12.5 Mg Tabs (Meclizine hcl) .Marland Kitchen.. 1po q 6 hrs as needed dizziness  Patient Instructions: 1)  Please take all new medications as prescribed 2)  Continue all previous medications as before this visit  3)  you can also use tylenol as needed for pain  4)  Please schedule a follow-up appointment as needed. Prescriptions: MECLIZINE HCL 12.5 MG TABS (MECLIZINE HCL) 1po q 6 hrs as needed dizziness  #50 x 1   Entered and Authorized by:   Corwin Levins MD   Signed by:   Corwin Levins MD on 06/21/2009   Method used:   Print then Give to Patient   RxID:   [id number] AZITHROMYCIN 250 MG TABS (AZITHROMYCIN) 2po qd for 1 day, then 1po qd for 4days, then stop  #6 x 1   Entered and Authorized by:   Corwin Levins MD   Signed by:   Corwin Levins MD on 06/21/2009   Method used:   Print then Give to Patient   RxID:   [id number]

## 2010-04-06 LAB — CBC
HCT: 25.9 % — ABNORMAL LOW (ref 36.0–46.0)
Hemoglobin: 8.7 g/dL — ABNORMAL LOW (ref 12.0–15.0)
Platelets: 142 10*3/uL — ABNORMAL LOW (ref 150–400)
RBC: 2.91 MIL/uL — ABNORMAL LOW (ref 3.87–5.11)
RBC: 3.25 MIL/uL — ABNORMAL LOW (ref 3.87–5.11)
RDW: 14.6 % (ref 11.5–15.5)
WBC: 5.8 10*3/uL (ref 4.0–10.5)
WBC: 7.9 10*3/uL (ref 4.0–10.5)

## 2010-04-06 LAB — RPR: RPR Ser Ql: NONREACTIVE

## 2010-05-17 NOTE — Group Therapy Note (Signed)
NAMEMarland Kitchen  ZOELLE, MARKUS                ACCOUNT NO.:  [id number]   MEDICAL RECORD NO.:  [id number]          PATIENT TYPE:  WOC   LOCATION:  [facility]                   FACILITY:  [facility]   PHYSICIAN:  Argentina Donovan, MD        DATE OF BIRTH:  [birth year]   DATE OF SERVICE:  10/30/2007                                  CLINIC NOTE   REASON FOR VISIT:  Complete annual exam with Pap smear.   HISTORY OF PRESENT ILLNESS:  Ms. Apsey is a 40 year old gravida 5, para  2-0-3-2, who was seen in the maternity admissions unit on September 24, 2007, for lower abdominal pain.  She was found to have a right ovarian  cyst.  She had not had a complete physical and Pap smear since her last  child was born in 2003, before she was referred to this clinic for a  complete physical exam and Pap and followup of her right ovarian cyst.  Today, she states that the pain she was experiencing one month ago is  now resolved.  She states she has never had an abnormal Pap smear  before.  She states her last menstrual period was sometime in August and  she has been spacing out for every two to three months for the last  several years.  She thinks she has only had four to five periods in the  last 12 months.  She is sexually active, does not use protection or  contraception, and, while not necessarily interested in becoming  pregnant, she is not particularly interested in starting any birth  control today.  Other than the oligomenorrhea, the patient has no  complaints today.   PAST MEDICAL HISTORY:  Negative.  Patient denies any chronic medical  problems for which she is followed.   MEDICATIONS:  None.   ALLERGIES:  To SULFA and LATEX.   MENSTRUAL HISTORY:  She was 12 at the age of first menses.  She bleeds  approximately three days with each period of medium flow and her periods  have been approximately every two to three months.   OBSTETRICAL HISTORY:  She has been pregnant five times, has two vaginal  deliveries and  three terminations.  Her last Pap smear was 2003 and she  denies any abnormal Pap smear.   SURGICAL HISTORY:  She has never had any surgeries and no blood  transfusions.   FAMILY HISTORY:  Significant for diabetes in her father and maternal  grandparents, heart attack in her maternal grandmother, high blood  pressure in her father and maternal grandparents, cancer in her maternal  grandfather and father, who had prostate and lung cancer, respectively,  and blood clots in her sister.   SOCIAL HISTORY:  She does endorse social smoking and drinking, only when  she goes out, which is approximately one time a month.  She does drink  about 40 ounces of caffeinated beverages a day.   PHYSICAL EXAM:  VITAL SIGNS:  Temperature today is 99.1, pulse is 64,  blood pressure 108/77, weight is 240.6 pounds and height is 67 inches.  IN GENERAL:  This is  an alert and cooperative, obese female, in no acute  distress.  HEENT EXAM:  Reveals extraocular movements intact.  Pupils equal and  round.  Oropharynx is moist and pink.  NECK:  The neck is supple without mass or tenderness, no thyroid mass  appreciated.  CV:  Regular rate and rhythm without murmur, rub or gallop and 2+ radial  and DP pulses bilaterally.  LUNGS:  Clear to auscultation bilaterally with normal work of breathing,  no wheezes or crackles.  ABDOMEN:  Soft, nontender, nondistended, with normal bowel sounds.  GU EXAM:  She had normal external genitalia without lesion, normal  introitus.  Vagina pink and moist with normal rugae.  Cervix was  visualized, is multiparous.  A Pap smear was obtained without friability  or hemorrhage.  Bimanual exam was performed, which showed a normal-sized  uterus without tenderness.  Adnexa could not be appreciated, secondary  to body habitus.  EXTREMITIES:  Showed no cyanosis, clubbing or edema.  SKIN:  Showed no suspicious lesions.   ASSESSMENT AND PLAN:  Ms. Kleier is a 40 year old G5, P2-0-3-2, who   comes today for complete physical exam.   PLAN:  1. Complete physical exam is normal today and Pap smear was obtained      and patient will be informed of these results.  Due to unprotected      sex, patient did desire to have STD testing.  Gonorrhea and      Chlamydia will be done, as well as HIV and RPR.  2. For oligomenorrhea, the patient will come back in one month to see      if she has had her period by then.  We will also check a TSH today.      She did have an ultrasound in September, which did not show any      polycystic ovaries.  3. For her right ovarian cyst, her pain has resolved.  We will follow      up with a repeat ultrasound to ensure this is no longer present.     ______________________________  Ardeen Garland, MD    ______________________________  Argentina Donovan, MD    LM/MEDQ  D:  10/30/2007  T:  10/30/2007  Job:  [id number]

## 2010-05-17 NOTE — Consult Note (Signed)
NEW PATIENT CONSULTATION   Hannah Thompson, Hannah Thompson  DOB:  Dec 22, 1970                                       12/02/2008  ZOXWR#:[id number]   The patient presents today for her vulvar varicosities and also right  lower extremity varicosities.  She is a very pleasant 40 year old black  female who is approximately 2 weeks status post delivery of her son.  She reports that she had had vulvar varicosities with her first  pregnancy 7 years ago and that these had mostly resolved following this.  With her most recent pregnancy, she reports these have become much more  extensive.  She has had significant resolution of these following her  delivery.  She does have varicose veins on her right leg and reports  these also were much worse during her pregnancy and have improved post  delivery as well.   PAST MEDICAL HISTORY:  Negative for diabetes, hypertension, cardiac  disease or other major medical difficulties.   FAMILY HISTORY:  She reports that her maternal grandmother did have  varicosities.  She did also have arterial insufficiency, diabetes and  had eventual amputation.   SOCIAL HISTORY:  She is single.  She does have three children.  She  works as a Social worker.  She does not smoke or drink alcohol.Marland Kitchen   REVIEW OF SYSTEMS:  Significant for leg discomfort with walking,  otherwise negative for GENERAL, CARDIAC, PULMONARY, GI, GU, NEUROLOGIC,  MUSCULOSKELETAL, PSYCHIATRIC, ENT, HEMATOLOGIC OR SKIN.   PHYSICAL EXAM:  General:  Well-developed, well-nourished black female  appearing stated age of 60.  She is in no acute distress.  Vital signs:  Her blood pressure is 128/79, pulse 52, respirations 16.  HEENT:  Her  pupils are equal and reactive.  Extraocular movements are intact.  Her  conjunctivae are normal bilaterally.  Abdomen:  She has no masses or  tenderness.  Lower extremities are noted for no deformities, cyanosis or  edema.  She does have palpable dorsalis pedis pulses  bilaterally.  She  is grossly intact neurologically.  She has no ulcerations or rashes.  She does have varicosities mostly on her right vulvar region and in  these are approximately 4 mm in diameter.  Her right leg does show  varicose veins in the medial calf.   She underwent a screening duplex by myself and this did show enlarged  saphenous vein on the right with reflux.  I had a long discussion with  the patient.  I feel that she hopefully will have continued resolution  since she no longer has a gravid uterus pressing on her pelvic venous  return.  She has already had marked improvement since her delivery.  I  explained that with the size of her vulvar varicosities this may be  possible to do sclerotherapy, but may require stab phlebectomy as well.  She understands and will notify us should she continue to have  difficulty after waiting approximately 3-6 months for better resolution.  Also discussed the significance of her venous hypertension in her right  leg and explained the importance of elevation and compression for  symptomatic treatment of her venous hypertension.  She will notify us  should she develop any progression in the future.   Larina Earthly, M.D.  Electronically Signed   TFE/MEDQ  D:  12/02/2008  T:  12/03/2008  Job:  [id number]   cc:   Malachi Pro. Ambrose Mantle, M.D.

## 2010-05-20 NOTE — Discharge Summary (Signed)
[facility]  Patient:    Hannah Thompson, Hannah Thompson Visit Number: [id number] MRN: [id number]          Service Type: OBS Location: 910A 9114 [id number] Attending Physician:  Malon Kindle Dictated by:   Malachi Pro. Ambrose Mantle, M.D. Admit Date:  01/25/2001 Disc. Date: 01/28/01                             Discharge Summary  HISTORY OF PRESENT ILLNESS:   A 40 year old black single female, para 0-1-3-1, gravida 5, [facility] February 12, 2001, by ultrasound admitted in early labor. Blood group and type A positive, negative antibody, sickle cell negative, RPR nonreactive, rubella immune, hepatitis B surface antigen negative, HIV negative, GC and Chlamydia negative.  Triple screen borderline increased alpha fetoprotein, 2.4 multiples of the median.  One-hour Glucola 109, Group B Strep positive in prior pregnancy.  Abdominal ultrasound on August 23, 2000, showed an average gestational age of [redacted] weeks 2 days, [facility] February 12, 2001.  Repeat ultrasound on September 20, 2000, average gestational age [redacted] weeks 0 days, [facility] February 14, 2001.  No abnormalities were seen to explain the borderline elevated alpha fetoprotein.  Prenatal course was complicated by massive right labial varicosities, but otherwise was essentially uncomplicated.  The patient began contracting on the day of admission and came to maternity admissions unit for evaluation.  There her cervix was 3 cm, 50%, and progressed to 4 cm.  ALLERGIES:                    SULFA caused a rash.  PAST SURGICAL HISTORY:        1992 laser of the right ear for a keloid.  1997 laser of the right arm.  PAST MEDICAL HISTORY:         1991 Chlamydia, history of low grade squamous intraepithelial lesion on Pap.  Alcohol, tobacco, and drugs none.  FAMILY HISTORY:               Parents with high blood pressure.  Sister with some type of anemia.  PAST OBSTETRIC HISTORY:       In 1992 the patient had spontaneous abortion. In 1997 and  1998 she had early abortions.  In June of 2001 she delivered a 6 pound 11 ounce female vaginally after preterm premature rupture of membranes and she also had an elevated maternal serum alpha fetoprotein.  PHYSICAL EXAMINATION:         On admission her vital signs were normal. ABDOMEN: Soft.  Fundal height 37 cm.  On January 15, 2001, fetal heart tones were normal.  Contractions were every two to three minutes.  PELVIC: Cervix was 4 cm, 60 to 70%, vertex at -2 to -3 station.  IMPRESSION:                   1. Intrauterine pregnancy at 37+ weeks.                               2. Early labor.                               3. Positive Group B Strep in prior pregnancy.  HOSPITAL COURSE:              The patient was placed on IV penicillin.  By 10:35 p.m. the cervix was 4 cm, 60%, vertex at -2 to -3.  Artificial rupture of membranes produced clear fluid.  By 11:25 p.m. the cervix was 4 cm, 60%, the patient had an epidural.  At 1:15 a.m. the contractions could not be traced, but the cervix was 5 cm at 12:07 a.m. and had progressed to 7 to 8 cm. She progressed to full dilatation and pushed well.  She pushed the baby down and delivered spontaneously ROA over an intact perineum by Dr. Ambrose Mantle, a living female infant, 7 pounds 4 ounces, Apgars of 8 at one minute and 9 at five minutes.  Mild shoulder dystocia was treated with McRoberts and woodscrew maneuvers.  The placenta was intact.  The uterus was normal.  No lacerations were apparent.  The patient did have tremendously large right labial and pubic varicosities.  The baby had a supernumerary digit on the right hand. Postpartum, the patient did very well and was discharged on the second postpartum day.  Initial hemoglobin was 9.9, hematocrit 29.6, white count 7600, and platelet count 109,000.  This was repeated and the platelet count was 173,000.  On the first postpartum day, hemoglobin was 9.5, hematocrit 27.5, platelet count 145,000, white count  7200.  RPR was nonreactive.  FINAL DIAGNOSES:              1. Intrauterine pregnancy at 37+ weeks,                                  delivered right occipitoanterior.                               2. Mild shoulder dystocia.                               3. Positive Group B Strep in prior pregnancy.                               4. Large vulvar varicosities.  PROCEDURE:                    Spontaneous delivery, ROA.  CONDITION ON DISCHARGE:       Improved.  DISCHARGE INSTRUCTIONS:       Include our regular discharge instruction booklet.  The patient is advised to return to the office in six weeks for follow-up examination.  DISCHARGE MEDICATIONS:        1. Percocet 5/325 mg 16 tablets, one every four                                  to six hours as needed for pain. Dictated by:   Malachi Pro. Ambrose Mantle, M.D. Attending Physician:  Malon Kindle DD:  01/28/01 TD:  01/28/01 Job: [id number]

## 2010-05-20 NOTE — Discharge Summary (Signed)
[facility] of [facility]  Patient:    Hannah Thompson, Hannah Thompson                       MRN: [id number] Adm. Date:  [id number] Disc. Date: [id number] Attending:  Michaele Offer                           Discharge Summary  This is a 40 year old black female para 0-0-3-0, gravida 4, estimated gestational age 33+ weeks by 13-week ultrasound with [facility] of July 17, 1999, who presented complaining of spontaneous rupture of membranes at 11:30 p.m. on June 23, 1999.  She had had a few contractions, no vaginal bleeding and good fetal movement.  Blood group and type was A+ with a negative antibody, RPR nonreactive, sickle cell negative, rubella nonimmune, hepatitis B surface antigen negative, HIV negative, GC/Chlamydia negative, one-hour Glucola 106 and group B strep positive.  The patient was evaluated in the maternity admission unit with a positive fern test.  Vaginal exam showed the cervix to be a fingertip thick, vertex high. She was admitted and started on Pitocin and penicillin for positive group B strep.  Prenatal course complicated by low-grade SIL on Pap with a aceto/white epithelium at 5 to 7 oclock on colposcopy.  No biopsy done.  A repeat Pap done at 28 weeks, repeat colposcopy done at 29 weeks and ______ was CIN-1. Increased maternal serum alpha fetoprotein was present with a normal ultrasound.  Allergies were treated with Claritin.  Anemia treated with iron.  Vulvar varicosities were noted and the patient had one reactive non-stress test with decreased fetal movement.  OBSTETRIC HISTORY:  She had a spontaneous abortion x1, early abortion x2.  GYNECOLOGIC HISTORY:  Chlamydia treated in 1991.  PAST MEDICAL HISTORY:  History of frequent urinary tract infections.  PAST SURGICAL HISTORY:  Keloid removals in 1992 and 1997.  ALLERGIES:  SULFA.  MEDICATIONS:  Claritin and iron.  SOCIAL HISTORY:  Single.  No tobacco.  PHYSICAL EXAMINATION:  VITAL SIGNS:  On  admission revealed normal vital signs.  ABDOMEN:  Gravid and nontender.  Estimated fetal weight about 7 pounds.  Fetal heart tones were reactive with irregular contractions.  VAGINAL:  Deferred, since the patient had been examined in maternity admissions.  IMPRESSION:  Intrauterine pregnancy at 36+ weeks with preterm premature rupture of membranes, positive group B strep.  The patient was treated with penicillin, placed on Pitocin at 16 mu/minute. The cervix began to change and by 12:05 p.m. the cervix was 2 cm, 60% vertex at a -2 to -3.  Contractions were every two to three minutes.  There were no decelerations.  The patient progressed to 3 cm, received an epidural and then progressed to full dilatation.  She delivered a living female infant, 6 pounds 11 ounces, with Apgars of 9 at 1 and 9 at five minutes over an intact perineum by Dr. Ambrose Mantle under epidural block.  There was one tight loop of nuchal cord. The placenta was intact.  The uterus was normal.  Anterior vaginal laceration just proximal to the urethral meatus was sutured with 3-0 Dexon under local block with a loss of about 400 cc.  Postpartum the patient did well and was discharged on the second postpartum day.  The RPR was non-reactive. Hemoglobin on admission 11.0, hematocrit 34.2, white count 6400, platelet count 142,000.  Followup hemoglobin was 10.3, hematocrit 31.5, white count 8800, platelet count 154,000.  FINAL  DIAGNOSES:  Intrauterine pregnancy at 36+ weeks with preterm premature rupture of membranes, positive group B strep, history of elevated maternal serum alpha fetoprotein, history of low-grade squamous intraepithelial lesion on Pap smear.  OPERATIONS:  Spontaneous delivery LOA, repair of vaginal laceration.  FINAL CONDITION:  Improved.  INSTRUCTIONS:  Include our regular discharge instruction booklet.  The patient declined analgesics at discharge and was asked to return in six weeks for follow up  examination. DD:  06/26/99 TD:  06/27/99 Job: [id number]

## 2010-06-16 ENCOUNTER — Ambulatory Visit (INDEPENDENT_AMBULATORY_CARE_PROVIDER_SITE_OTHER): Payer: Medicaid Other | Admitting: Internal Medicine

## 2010-06-16 ENCOUNTER — Ambulatory Visit (INDEPENDENT_AMBULATORY_CARE_PROVIDER_SITE_OTHER)
Admission: RE | Admit: 2010-06-16 | Discharge: 2010-06-16 | Disposition: A | Discharge status: Home / Self Care | Payer: Medicaid Other | Source: Ambulatory Visit | Attending: Internal Medicine | Admitting: Internal Medicine

## 2010-06-16 ENCOUNTER — Encounter: Payer: Self-pay | Admitting: Internal Medicine

## 2010-06-16 ENCOUNTER — Other Ambulatory Visit (INDEPENDENT_AMBULATORY_CARE_PROVIDER_SITE_OTHER): Payer: Medicaid Other

## 2010-06-16 VITALS — BP 100/70 | HR 63 | Temp 99.3°F | Ht 67.0 in | Wt 257.2 lb

## 2010-06-16 DIAGNOSIS — R5383 Other fatigue: Secondary | ICD-10-CM

## 2010-06-16 DIAGNOSIS — M79609 Pain in unspecified limb: Secondary | ICD-10-CM

## 2010-06-16 DIAGNOSIS — R5381 Other malaise: Secondary | ICD-10-CM

## 2010-06-16 DIAGNOSIS — M79641 Pain in right hand: Secondary | ICD-10-CM

## 2010-06-16 DIAGNOSIS — E785 Hyperlipidemia, unspecified: Secondary | ICD-10-CM

## 2010-06-16 LAB — LIPID PANEL
HDL: 44.5 mg/dL (ref >39.00)
Total CHOL/HDL Ratio: 4

## 2010-06-16 LAB — CBC WITH DIFFERENTIAL/PLATELET
Basophils Absolute: 0 10*3/uL (ref 0.0–0.1)
Hemoglobin: 11.4 g/dL — ABNORMAL LOW (ref 12.0–15.0)
Lymphocytes Relative: 38.9 % (ref 12.0–46.0)
Monocytes Relative: 6.4 % (ref 3.0–12.0)
Platelets: 175 10*3/uL (ref 150.0–400.0)
RDW: 14.7 % — ABNORMAL HIGH (ref 11.5–14.6)

## 2010-06-16 LAB — TSH: TSH: 2.12 u[IU]/mL (ref 0.35–5.50)

## 2010-06-16 LAB — HEPATIC FUNCTION PANEL
AST: 15 U/L (ref 0–37)
Alkaline Phosphatase: 55 U/L (ref 39–117)
Total Bilirubin: 0.3 mg/dL (ref 0.3–1.2)

## 2010-06-16 LAB — BASIC METABOLIC PANEL
CO2: 27 mEq/L (ref 19–32)
Calcium: 8.6 mg/dL (ref 8.4–10.5)
Creatinine, Ser: 0.6 mg/dL (ref 0.4–1.2)
Glucose, Bld: 90 mg/dL (ref 70–99)

## 2010-06-16 MED ORDER — FEXOFENADINE HCL 180 MG PO TABS: 180.0000 mg | ORAL_TABLET | Freq: Every day | ORAL | Status: DC

## 2010-06-16 MED ORDER — HYDROCODONE-ACETAMINOPHEN 5-325 MG PO TABS: 1.0000 | ORAL_TABLET | Freq: Four times a day (QID) | ORAL | Status: AC | PRN

## 2010-06-16 MED ORDER — CYCLOBENZAPRINE HCL 5 MG PO TABS: 5.0000 mg | ORAL_TABLET | Freq: Three times a day (TID) | ORAL | Status: DC | PRN

## 2010-06-16 NOTE — Progress Notes (Signed)
Quick Note:  Voice message left on [employer] system - lab is negative, normal or otherwise stable, pt to continue same tx ______ 

## 2010-06-16 NOTE — Progress Notes (Signed)
  Subjective:    Patient ID: Hannah Thompson, female    DOB: 1970/12/30, 40 y.o.   MRN: [id number]  HPI Here for f/u after last seen mar 2011; overall doing ok; Pt denies chest pain, increased sob or doe, wheezing, orthopnea, PND, increased LE swelling, palpitations, dizziness or syncope.  Pt denies new neurological symptoms such as new headache, or facial or extremity weakness or numbness  Pt denies polydipsia, polyuria  Pt states overall good compliance with meds, trying to follow lower cholesterol diet, wt overall stable but little exercise however.   Overall good compliance with treatment, and good medicine tolerability.  Denies worsening depressive symptoms, suicidal ideation, or panic, though has ongoing anxiety, not increased recently.   Does have sense of ongoing fatigue, but denies signficant hypersomnolence.  Did incidnetly have traumatic injury to the right hand yesterday, details unclear but no assoc with fall;  Most pain is to medial hand and has marked soft tissue swelling today, and mild bruising, no lacerations. Past Medical History  Diagnosis Date  . Headache 06/21/2009    recurrent  . VITAMIN B12 DEFICIENCY 03/18/2009  . ASTHMA 03/17/2009  . ALLERGIC RHINITIS 03/17/2009  . ECZEMA 03/17/2009  . DISC DISEASE, LUMBAR 03/17/2009    mild facet arthropathy by MRI 2005  . HYPERLIPIDEMIA 03/17/2009  . ANEMIA-NOS 03/17/2009  . KELOID 06/21/2009   Past Surgical History  Procedure Date  . Keloid excision mid 1990's    s/p keloid removal-laser     reports that she has quit smoking. She does not have any smokeless tobacco history on file. She reports that she drinks alcohol. She reports that she does not use illicit drugs. family history includes Alcohol abuse in her other; Allergies in her sister; Cancer in her father, other, and paternal aunt; Diabetes in her other; Heart disease in her other; and Hypertension in her father. Allergies  Allergen Reactions  . Sulfonamide Derivatives    REACTION: hives   No current outpatient prescriptions on file prior to visit.   Review of Systems Review of Systems  Constitutional: Negative for diaphoresis and unexpected weight change.  HENT: Negative for drooling and tinnitus.   Eyes: Negative for photophobia and visual disturbance.  Respiratory: Negative for choking and stridor.   Gastrointestinal: Negative for vomiting and blood in stool.  Genitourinary: Negative for hematuria and decreased urine volume.  Musculoskeletal: Negative for gait problem.  Skin: Negative for color change and wound.  Neurological: Negative for tremors and numbness.  Psychiatric/Behavioral: Negative for decreased concentration. The patient is not hyperactive.        Objective:   Physical Exam BP 100/70  Pulse 63  Temp(Src) 99.3 F (37.4 C) (Oral)  Ht 5\' 7"  (1.702 m)  Wt 257 lb 4 oz (116.688 kg)  BMI 40.29 kg/m2  SpO2 97%  LMP 04/03/2010 Physical Exam  VS noted Constitutional: Pt appears well-developed and well-nourished.  HENT: Head: Normocephalic.  Right Ear: External ear normal.  Left Ear: External ear normal.  Eyes: Conjunctivae and EOM are normal. Pupils are equal, round, and reactive to light.  Neck: Normal range of motion. Neck supple.  Cardiovascular: Normal rate and regular rhythm.   Pulmonary/Chest: Effort normal and breath sounds normal.  Abd:  Soft, NT, non-distended, + BS Neurological: Pt is alert. No cranial nerve deficit.  Skin: Skin is warm. No erythema.  Psychiatric: Pt behavior is normal. Thought content normal.         Assessment & Plan:

## 2010-06-16 NOTE — Patient Instructions (Signed)
Take all new medications as prescribed (the pain medication) Continue all other medications as before (your refills were sent in) Please go to XRAY in the Basement for the x-ray test Please call the phone number [phone] (the [employer] System) for results of testing in 2-3 days;  When calling, simply dial the number, and when prompted enter the MRN number above (the Medical Record Number) and the # key, then the message should start. If there is a fracture, we will need to refer you to orthopedic Please return in 1 year for your yearly visit, or sooner if needed

## 2010-06-16 NOTE — Assessment & Plan Note (Signed)
stable overall by hx and exam, most recent data reviewed with pt, and pt to continue medical treatment as before  Lab Results  Component Value Date   WBC 4.8 03/17/2009   HGB 11.9* 03/17/2009   HCT 36.7 03/17/2009   PLT 177.0 03/17/2009   ALT 13 03/17/2009   AST 15 03/17/2009   NA 140 03/17/2009   K 3.9 03/17/2009   CL 107 03/17/2009   CREATININE 0.8 03/17/2009   BUN 5* 03/17/2009   CO2 31 03/17/2009   TSH 1.38 03/17/2009

## 2010-06-16 NOTE — Assessment & Plan Note (Signed)
Etiology unclear, Exam otherwise benign, to check labs as documented, follow with expectant management  

## 2010-06-16 NOTE — Progress Notes (Signed)
Addended by: Corwin Levins on: 06/16/2010 08:38 PM   Modules accepted: Orders

## 2010-06-16 NOTE — Assessment & Plan Note (Signed)
Mod pain with significant swelling, for film today - r/o fx,  Pain meds, and  to f/u any worsening symptoms or concerns

## 2010-06-17 ENCOUNTER — Telehealth: Payer: Self-pay | Admitting: *Deleted

## 2010-06-17 LAB — URINALYSIS, ROUTINE W REFLEX MICROSCOPIC
Bilirubin Urine: NEGATIVE
Nitrite: NEGATIVE
Total Protein, Urine: NEGATIVE
pH: 6.5 (ref 5.0–8.0)

## 2010-06-17 NOTE — Telephone Encounter (Signed)
Not sure what to tell her, except that to protect it for a couple of days by keeping the arm/shoulder in sling (OTC at the drug store) would help keep the hand better protected, but she should not wear the sling more than 2-3 days

## 2010-06-17 NOTE — Telephone Encounter (Signed)
Pt was seen at [facility] yesterday for her hand pain and is aware that hand is not fractured- she wants to know if she should cover her hand or if she needs to put brace on. She states her hand is painful to touch and she keeps hitting hand on objects around her-please advise

## 2010-06-17 NOTE — Progress Notes (Signed)
Quick Note:  Voice message left on [employer] system - lab is negative, normal or otherwise stable, pt to continue same tx ______ 

## 2010-06-17 NOTE — Telephone Encounter (Signed)
Pt informed of MD's advisement. 

## 2010-06-23 ENCOUNTER — Telehealth: Payer: Self-pay

## 2010-06-23 MED ORDER — FEXOFENADINE-PSEUDOEPHED ER 180-240 MG PO TB24: 1.0000 | ORAL_TABLET | Freq: Every day | ORAL | Status: AC

## 2010-06-23 MED ORDER — EPINEPHRINE 0.3 MG/0.3ML IJ DEVI: 0.3000 mg | Freq: Once | INTRAMUSCULAR | Status: DC

## 2010-06-23 NOTE — Telephone Encounter (Signed)
Both done per emr 

## 2010-06-23 NOTE — Telephone Encounter (Signed)
Pt called requesting Rx for Epipen, pt says she has had pen but it as since expired. Pt is also requesting to have Allegra Rx changed and re-prescribed for Allegra D. Please advise.

## 2010-10-03 LAB — URINALYSIS, ROUTINE W REFLEX MICROSCOPIC
Bilirubin Urine: NEGATIVE
Glucose, UA: NEGATIVE
Hgb urine dipstick: NEGATIVE
Ketones, ur: NEGATIVE
Protein, ur: NEGATIVE
Urobilinogen, UA: 1

## 2010-10-03 LAB — WET PREP, GENITAL
Clue Cells Wet Prep HPF POC: NONE SEEN
Trich, Wet Prep: NONE SEEN

## 2010-10-03 LAB — CBC
HCT: 36
MCHC: 32.9
MCV: 87.8
RBC: 4.1
WBC: 4.3

## 2010-10-03 LAB — DIFFERENTIAL
Basophils Relative: 1
Eosinophils Absolute: 0.1
Eosinophils Relative: 3
Lymphs Abs: 1.9
Monocytes Absolute: 0.3
Neutrophils Relative %: 44

## 2010-10-04 LAB — POCT PREGNANCY, URINE: Preg Test, Ur: NEGATIVE

## 2011-03-29 ENCOUNTER — Other Ambulatory Visit: Payer: Self-pay | Admitting: Internal Medicine

## 2011-05-11 ENCOUNTER — Ambulatory Visit (INDEPENDENT_AMBULATORY_CARE_PROVIDER_SITE_OTHER): Payer: Medicaid Other | Admitting: Internal Medicine

## 2011-05-11 ENCOUNTER — Encounter: Payer: Self-pay | Admitting: Internal Medicine

## 2011-05-11 VITALS — BP 108/70 | HR 75 | Temp 97.7°F | Ht 67.0 in | Wt 267.1 lb

## 2011-05-11 DIAGNOSIS — D649 Anemia, unspecified: Secondary | ICD-10-CM

## 2011-05-11 DIAGNOSIS — L910 Hypertrophic scar: Secondary | ICD-10-CM

## 2011-05-11 DIAGNOSIS — E669 Obesity, unspecified: Secondary | ICD-10-CM

## 2011-05-11 DIAGNOSIS — R21 Rash and other nonspecific skin eruption: Secondary | ICD-10-CM

## 2011-05-11 MED ORDER — PHENTERMINE HCL 37.5 MG PO CAPS: 37.5000 mg | ORAL_CAPSULE | ORAL | Status: DC

## 2011-05-11 MED ORDER — CLOTRIMAZOLE-BETAMETHASONE 1-0.05 % EX CREA: TOPICAL_CREAM | CUTANEOUS | Status: AC

## 2011-05-11 NOTE — Progress Notes (Signed)
Subjective:    Patient ID: Hannah Thompson, female    DOB: 25-Sep-1970, 41 y.o.   MRN: [id number]  HPI  Here to f/u with rash to area between mult toes, mild itchy, no pain or drainage , no fever, swelling or red streaks, wears socks with sweaty feet quite a bit.  Pt denies chest pain, increased sob or doe, wheezing, orthopnea, PND, increased LE swelling, palpitations, dizziness or syncope.  Pt denies new neurological symptoms such as new headache, or facial or extremity weakness or numbness   Pt denies polydipsia, polyuria  Pt states overall good compliance with meds,  wt overall stable but little exercise however, cannot seem to lose wt.  Denies worsening depressive symptoms, suicidal ideation, or panic, though has ongoing anxiety, not increased recently.    No overt bleeding or bruising.  Does have a keloid recurrent to right face after a skin lesion removal, better with prior steroid intralesional injections but now worse again, asks for derm referral. Past Medical History  Diagnosis Date  . Headache 06/21/2009    recurrent  . VITAMIN B12 DEFICIENCY 03/18/2009  . ASTHMA 03/17/2009  . ALLERGIC RHINITIS 03/17/2009  . ECZEMA 03/17/2009  . DISC DISEASE, LUMBAR 03/17/2009    mild facet arthropathy by MRI 2005  . HYPERLIPIDEMIA 03/17/2009  . ANEMIA-NOS 03/17/2009  . KELOID 06/21/2009   Past Surgical History  Procedure Date  . Keloid excision mid 1990's    s/p keloid removal-laser     reports that she has quit smoking. She does not have any smokeless tobacco history on file. She reports that she drinks alcohol. She reports that she does not use illicit drugs. family history includes Alcohol abuse in her other; Allergies in her sister; Cancer in her father, other, and paternal aunt; Diabetes in her other; Heart disease in her other; and Hypertension in her father. Allergies  Allergen Reactions  . Sulfonamide Derivatives     REACTION: hives   Current Outpatient Prescriptions on File Prior to Visit    Medication Sig Dispense Refill  . cyanocobalamin (,VITAMIN B-12,) 1000 MCG/ML injection 1 cc IM q month       . cyclobenzaprine (FLEXERIL) 5 MG tablet Take 5 mg by mouth 3 (three) times daily as needed.        . cyclobenzaprine (FLEXERIL) 5 MG tablet TAKE 1 TABLET BY MOUTH 3 TIMES DAILY AS NEEDED FOR MUSCLE SPASM  60 tablet  1  . EPINEPHrine (EPIPEN) 0.3 mg/0.3 mL DEVI Inject 0.3 mLs (0.3 mg total) into the muscle once.  1 Device  1  . fexofenadine-pseudoephedrine (ALLEGRA-D 24) 180-240 MG per 24 hr tablet Take 1 tablet by mouth daily.  30 tablet  2  . phentermine 37.5 MG capsule Take 1 capsule (37.5 mg total) by mouth every morning.  30 capsule  2   Review of Systems Review of Systems  Constitutional: Negative for diaphoresis and unexpected weight change.  Eyes: Negative for photophobia and visual disturbance. or HA Respiratory: Negative for choking and stridor.   Gastrointestinal: Negative for vomiting and blood in stool.  Genitourinary: Negative for hematuria and decreased urine volume.  Musculoskeletal: Negative for gait problem.  Skin: Negative for color change except for the above Neurological: Negative for tremors and numbness.  Psychiatric/Behavioral: Negative for decreased concentration. The patient is not hyperactive.      Objective:   Physical Exam BP 108/70  Pulse 75  Temp(Src) 97.7 F (36.5 C) (Oral)  Ht 5\' 7"  (1.702 m)  Wt 267 lb  2 oz (121.167 kg)  BMI 41.84 kg/m2  SpO2 98% Physical Exam  VS noted Constitutional: Pt appears well-developed and well-nourished.  HENT: Head: Normocephalic.  Right Ear: External ear normal.  Left Ear: External ear normal.  Eyes: Conjunctivae and EOM are normal. Pupils are equal, round, and reactive to light.  Neck: Normal range of motion. Neck supple.  Cardiovascular: Normal rate and regular rhythm.   Pulmonary/Chest: Effort normal and breath sounds normal.  Neurological: Pt is alert. Not confused Skin: Skin is warm. No erythema.  except for whitish rash between all toes, nontender, no swelling or drainage 3 cm keloid noted right jawline/face area Psychiatric: Pt behavior is normal. Thought content normal. 1+ nervous    Assessment & Plan:

## 2011-05-11 NOTE — Patient Instructions (Addendum)
Take all new medications as prescribed Continue all other medications as before You will be contacted regarding the referral for: dermatology No blood work today Please return in 1 year for a yearly visit, or sooner if needed

## 2011-05-13 ENCOUNTER — Encounter: Payer: Self-pay | Admitting: Internal Medicine

## 2011-05-13 DIAGNOSIS — E669 Obesity, unspecified: Secondary | ICD-10-CM | POA: Insufficient documentation

## 2011-05-13 NOTE — Assessment & Plan Note (Signed)
C/w prb fungal rash, for lotrisone asd, consider nizoral if not improved

## 2011-05-13 NOTE — Assessment & Plan Note (Signed)
For derm referral as requested,  to f/u any worsening symptoms or concerns

## 2011-05-13 NOTE — Assessment & Plan Note (Signed)
Ok for phenetermin for limited tx asd,  to f/u any worsening symptoms or concerns

## 2011-05-13 NOTE — Assessment & Plan Note (Signed)
stable overall by hx and exam, most recent data reviewed with pt, and pt to continue medical treatment as before, declines f/u labs today Lab Results  Component Value Date   WBC 5.3 06/16/2010   HGB 11.4* 06/16/2010   HCT 33.8* 06/16/2010   MCV 86.9 06/16/2010   PLT 175.0 06/16/2010

## 2012-07-01 ENCOUNTER — Encounter (HOSPITAL_COMMUNITY): Payer: Self-pay | Admitting: Emergency Medicine

## 2012-07-01 ENCOUNTER — Emergency Department (HOSPITAL_COMMUNITY)
Admission: EM | Admit: 2012-07-01 | Discharge: 2012-07-01 | Disposition: A | Discharge status: Home / Self Care | Payer: Medicaid Other | Attending: Emergency Medicine | Admitting: Emergency Medicine

## 2012-07-01 DIAGNOSIS — L309 Dermatitis, unspecified: Secondary | ICD-10-CM

## 2012-07-01 DIAGNOSIS — B86 Scabies: Secondary | ICD-10-CM | POA: Insufficient documentation

## 2012-07-01 DIAGNOSIS — L299 Pruritus, unspecified: Secondary | ICD-10-CM | POA: Insufficient documentation

## 2012-07-01 DIAGNOSIS — Z87891 Personal history of nicotine dependence: Secondary | ICD-10-CM | POA: Insufficient documentation

## 2012-07-01 DIAGNOSIS — Z8709 Personal history of other diseases of the respiratory system: Secondary | ICD-10-CM | POA: Insufficient documentation

## 2012-07-01 DIAGNOSIS — Z79899 Other long term (current) drug therapy: Secondary | ICD-10-CM | POA: Insufficient documentation

## 2012-07-01 DIAGNOSIS — Z8639 Personal history of other endocrine, nutritional and metabolic disease: Secondary | ICD-10-CM | POA: Insufficient documentation

## 2012-07-01 DIAGNOSIS — Z872 Personal history of diseases of the skin and subcutaneous tissue: Secondary | ICD-10-CM | POA: Insufficient documentation

## 2012-07-01 DIAGNOSIS — Z862 Personal history of diseases of the blood and blood-forming organs and certain disorders involving the immune mechanism: Secondary | ICD-10-CM | POA: Insufficient documentation

## 2012-07-01 DIAGNOSIS — L259 Unspecified contact dermatitis, unspecified cause: Secondary | ICD-10-CM | POA: Insufficient documentation

## 2012-07-01 DIAGNOSIS — Z8739 Personal history of other diseases of the musculoskeletal system and connective tissue: Secondary | ICD-10-CM | POA: Insufficient documentation

## 2012-07-01 DIAGNOSIS — J45909 Unspecified asthma, uncomplicated: Secondary | ICD-10-CM | POA: Insufficient documentation

## 2012-07-01 MED ORDER — PERMETHRIN 5 % EX CREA: TOPICAL_CREAM | CUTANEOUS | Status: DC

## 2012-07-01 NOTE — ED Notes (Signed)
Pt c/o generalized body rash x 1 week that is itchy

## 2012-07-01 NOTE — ED Provider Notes (Signed)
History    CSN: [id number] Arrival date & time 07/01/12  1113  First MD Initiated Contact with Patient 07/01/12 1353     Chief Complaint  Patient presents with  . Rash   (Consider location/radiation/quality/duration/timing/severity/associated sxs/prior Treatment) The history is provided by the patient. No language interpreter was used.  Hannah Thompson is a 42 y/o F with PMHx of asthma, eczema, HLD, keloid presenting to the ED with rash that started approximately 1 week ago - patient reported that the rash started in her hands first and then gradually went to her arms and then chest and abdomen and legs. Patient denied pain, but reported that the rash is extremely itchy - reported that the pruritis is worse at night. Stated that she has used nothing for the discomfort. Reported that nothing makes the rash better or worse. Denied fever, chills, chest pain, shortness of breath, difficulty breathing, gi symptoms, urinary symptoms, changes to soaps/detergents. Denied anyone else in home to have them.  PCP Dr. Excell Seltzer  Past Medical History  Diagnosis Date  . Headache(784.0) 06/21/2009    recurrent  . VITAMIN B12 DEFICIENCY 03/18/2009  . ASTHMA 03/17/2009  . ALLERGIC RHINITIS 03/17/2009  . ECZEMA 03/17/2009  . DISC DISEASE, LUMBAR 03/17/2009    mild facet arthropathy by MRI 2005  . HYPERLIPIDEMIA 03/17/2009  . ANEMIA-NOS 03/17/2009  . KELOID 06/21/2009   Past Surgical History  Procedure Laterality Date  . Keloid excision  mid 1990's    s/p keloid removal-laser    Family History  Problem Relation Age of Onset  . Cancer Father     Prostate Cancer  . Hypertension Father   . Allergies Sister   . Cancer Paternal Aunt     Ovarian Cancer  . Cancer Other     Grandparent-Lung Cancer  . Diabetes Other     Grandparent  . Heart disease Other     Grandparent  . Alcohol abuse Other     Several on both sides of family-3 uncles   History  Substance Use Topics  . Smoking status: Former Games developer   . Smokeless tobacco: Not on file  . Alcohol Use: Yes   OB History   Grav Para Term Preterm Abortions TAB SAB Ect Mult Living                 Review of Systems  Constitutional: Negative for fever and chills.  HENT: Negative for congestion, trouble swallowing and neck pain.   Respiratory: Negative for chest tightness and shortness of breath.   Cardiovascular: Negative for chest pain.  Gastrointestinal: Negative for nausea, vomiting, abdominal pain and diarrhea.  Skin: Positive for rash.  Neurological: Negative for dizziness, weakness, numbness and headaches.  All other systems reviewed and are negative.    Allergies  Sulfonamide derivatives  Home Medications   Current Outpatient Rx  Name  Route  Sig  Dispense  Refill  . EPINEPHrine (EPI-PEN) 0.3 mg/0.3 mL DEVI   Intramuscular   Inject 0.3 mg into the muscle daily as needed (Anaphylaxis).         . permethrin (ELIMITE) 5 % cream      Apply to body, neck to toes, once for 6 hours and remove in shower. Re-apply from neck to toes again one week later.   60 g   0    BP 108/53  Pulse 68  Temp(Src) 98 F (36.7 C) (Oral)  Wt 267 lb (121.11 kg)  BMI 41.81 kg/m2  SpO2 99% Physical Exam  Nursing note and vitals reviewed. Constitutional: She is oriented to person, place, and time. She appears well-developed and well-nourished. No distress.  HENT:  Head: Normocephalic and atraumatic.  Mouth/Throat: Oropharynx is clear and moist. No oropharyngeal exudate.  Eyes: Conjunctivae and EOM are normal. Pupils are equal, round, and reactive to light. Right eye exhibits no discharge. Left eye exhibits no discharge.  Neck: Normal range of motion. Neck supple.  Cardiovascular: Normal rate, regular rhythm and normal heart sounds.  Exam reveals no friction rub.   No murmur heard. Pulmonary/Chest: Effort normal and breath sounds normal. No respiratory distress. She has no wheezes. She has no rales.  Lymphadenopathy:    She has no  cervical adenopathy.  Neurological: She is alert and oriented to person, place, and time. No cranial nerve deficit. She exhibits normal muscle tone. Coordination normal.  Skin: Skin is warm and dry. Rash noted. She is not diaphoretic.  Small, raised papules located all over fingers, hands, webspace, chest, face. Scattered on legs. Negative erythema. Negative oozing.    Psychiatric: She has a normal mood and affect. Her behavior is normal. Thought content normal.    ED Course  Procedures (including critical care time) Labs Reviewed - No data to display No results found.  1. Scabies   2. Dermatitis     MDM  Patient presenting with rash that has been ongoing x 1 week. Located all over - started in hands and worked way up - as per patient report.  Small,raised non-erythematous papules located to the hands, arms, chest, face. Webspaces affected. Extreme pruritis. Suspicion to be scabies with dermatitis reaction associated. Patient stable, afebrile. Discharged patient with permetherin. Recommended benadryl for the itch. Referred to PCP and dermatologist. Discussed hygiene and cleaning. Discussed with patient to continue to monitor symptoms and if symptoms are to worsen or change to report back to the ED - strict return instructions given. Patient agreed to plan of care, understood, all questions answered.   Raymon Mutton, PA-C 07/01/12 1726

## 2012-07-02 NOTE — ED Provider Notes (Signed)
Medical screening examination/treatment/procedure(s) were performed by non-physician practitioner and as supervising physician I was immediately available for consultation/collaboration.  Derwood Kaplan, MD 07/02/12 [phone]

## 2012-07-05 ENCOUNTER — Emergency Department (HOSPITAL_BASED_OUTPATIENT_CLINIC_OR_DEPARTMENT_OTHER)
Admission: EM | Admit: 2012-07-05 | Discharge: 2012-07-05 | Disposition: A | Discharge status: Home / Self Care | Payer: Medicaid Other | Attending: Emergency Medicine | Admitting: Emergency Medicine

## 2012-07-05 ENCOUNTER — Encounter (HOSPITAL_BASED_OUTPATIENT_CLINIC_OR_DEPARTMENT_OTHER): Payer: Self-pay | Admitting: *Deleted

## 2012-07-05 DIAGNOSIS — Z862 Personal history of diseases of the blood and blood-forming organs and certain disorders involving the immune mechanism: Secondary | ICD-10-CM | POA: Insufficient documentation

## 2012-07-05 DIAGNOSIS — Z8639 Personal history of other endocrine, nutritional and metabolic disease: Secondary | ICD-10-CM | POA: Insufficient documentation

## 2012-07-05 DIAGNOSIS — Z87891 Personal history of nicotine dependence: Secondary | ICD-10-CM | POA: Insufficient documentation

## 2012-07-05 DIAGNOSIS — L309 Dermatitis, unspecified: Secondary | ICD-10-CM

## 2012-07-05 DIAGNOSIS — Z8739 Personal history of other diseases of the musculoskeletal system and connective tissue: Secondary | ICD-10-CM | POA: Insufficient documentation

## 2012-07-05 DIAGNOSIS — L539 Erythematous condition, unspecified: Secondary | ICD-10-CM | POA: Insufficient documentation

## 2012-07-05 DIAGNOSIS — J45909 Unspecified asthma, uncomplicated: Secondary | ICD-10-CM | POA: Insufficient documentation

## 2012-07-05 DIAGNOSIS — Z872 Personal history of diseases of the skin and subcutaneous tissue: Secondary | ICD-10-CM | POA: Insufficient documentation

## 2012-07-05 DIAGNOSIS — L259 Unspecified contact dermatitis, unspecified cause: Secondary | ICD-10-CM | POA: Insufficient documentation

## 2012-07-05 MED ORDER — TRIAMCINOLONE ACETONIDE 0.1 % EX CREA: TOPICAL_CREAM | Freq: Two times a day (BID) | CUTANEOUS | Status: DC

## 2012-07-05 MED ORDER — PREDNISONE 10 MG PO TABS: ORAL_TABLET | ORAL | Status: DC

## 2012-07-05 NOTE — ED Notes (Signed)
Rash x 2 weeks. Was seen this week for same and diagnosed with scabies. Used the medication and the rash got worse.

## 2012-07-05 NOTE — ED Provider Notes (Signed)
History    CSN: [id number] Arrival date & time 07/05/12  1600  First MD Initiated Contact with Patient 07/05/12 1642     Chief Complaint  Patient presents with  . Rash   (Consider location/radiation/quality/duration/timing/severity/associated sxs/prior Treatment) Patient is a 42 y.o. female presenting with rash. The history is provided by the patient. No language interpreter was used.  Rash Pain location:  Generalized Pain radiates to:  Does not radiate Pain severity:  Moderate Timing:  Constant Relieved by:  Nothing Worsened by:  Nothing tried Pt reports she has a severe rash.  Pt treated with elemite  Past Medical History  Diagnosis Date  . Headache(784.0) 06/21/2009    recurrent  . VITAMIN B12 DEFICIENCY 03/18/2009  . ASTHMA 03/17/2009  . ALLERGIC RHINITIS 03/17/2009  . ECZEMA 03/17/2009  . DISC DISEASE, LUMBAR 03/17/2009    mild facet arthropathy by MRI 2005  . HYPERLIPIDEMIA 03/17/2009  . ANEMIA-NOS 03/17/2009  . KELOID 06/21/2009   Past Surgical History  Procedure Laterality Date  . Keloid excision  mid 1990's    s/p keloid removal-laser    Family History  Problem Relation Age of Onset  . Cancer Father     Prostate Cancer  . Hypertension Father   . Allergies Sister   . Cancer Paternal Aunt     Ovarian Cancer  . Cancer Other     Grandparent-Lung Cancer  . Diabetes Other     Grandparent  . Heart disease Other     Grandparent  . Alcohol abuse Other     Several on both sides of family-3 uncles   History  Substance Use Topics  . Smoking status: Former Games developer  . Smokeless tobacco: Not on file  . Alcohol Use: Yes   OB History   Grav Para Term Preterm Abortions TAB SAB Ect Mult Living                 Review of Systems  Skin: Positive for rash.  All other systems reviewed and are negative.    Allergies  Sulfonamide derivatives  Home Medications   Current Outpatient Rx  Name  Route  Sig  Dispense  Refill  . EPINEPHrine (EPI-PEN) 0.3 mg/0.3 mL  DEVI   Intramuscular   Inject 0.3 mg into the muscle daily as needed (Anaphylaxis).         . permethrin (ELIMITE) 5 % cream      Apply to body, neck to toes, once for 6 hours and remove in shower. Re-apply from neck to toes again one week later.   60 g   0    BP 134/73  Pulse 78  Temp(Src) 98.4 F (36.9 C) (Oral)  Resp 18  Ht 5\' 7"  (1.702 m)  Wt 267 lb (121.11 kg)  BMI 41.81 kg/m2  SpO2 100% Physical Exam  Nursing note and vitals reviewed. Constitutional: She is oriented to person, place, and time. She appears well-developed and well-nourished.  Cardiovascular: Normal rate.   Pulmonary/Chest: Effort normal.  Musculoskeletal: Normal range of motion.  Neurological: She is alert and oriented to person, place, and time. She has normal reflexes.  Skin: Rash noted. There is erythema.  Dry scaling,  Looks like severe eczema  Psychiatric: She has a normal mood and affect.    ED Course  Procedures (including critical care time) Labs Reviewed - No data to display No results found. 1. Eczema     MDM  Triamcinalone and prednisone taper.   Pt advised to see Dr. Jonny Ruiz  for recheck next week.  Elson Areas, PA-C 07/05/12 1703  Elson Areas, PA-C 07/05/12 1704

## 2012-07-05 NOTE — ED Provider Notes (Signed)
Medical screening examination/treatment/procedure(s) were performed by non-physician practitioner and as supervising physician I was immediately available for consultation/collaboration.  Geoffery Lyons, MD 07/05/12 [phone]

## 2012-09-27 ENCOUNTER — Other Ambulatory Visit: Payer: Self-pay | Admitting: *Deleted

## 2012-09-27 DIAGNOSIS — N6320 Unspecified lump in the left breast, unspecified quadrant: Secondary | ICD-10-CM

## 2012-09-30 ENCOUNTER — Other Ambulatory Visit: Payer: Self-pay | Admitting: *Deleted

## 2012-09-30 DIAGNOSIS — N6320 Unspecified lump in the left breast, unspecified quadrant: Secondary | ICD-10-CM

## 2012-10-01 ENCOUNTER — Ambulatory Visit (HOSPITAL_COMMUNITY)
Admission: RE | Admit: 2012-10-01 | Discharge: 2012-10-01 | Disposition: A | Discharge status: Home / Self Care | Payer: Medicaid Other | Source: Ambulatory Visit | Attending: Obstetrics and Gynecology | Admitting: Obstetrics and Gynecology

## 2012-10-01 ENCOUNTER — Encounter (HOSPITAL_COMMUNITY): Payer: Self-pay

## 2012-10-01 VITALS — BP 118/84 | Temp 98.4°F | Ht 67.0 in | Wt 280.2 lb

## 2012-10-01 DIAGNOSIS — Z01419 Encounter for gynecological examination (general) (routine) without abnormal findings: Secondary | ICD-10-CM

## 2012-10-01 NOTE — Progress Notes (Signed)
Complaints of left breast lump x 2 weeks that patient states has some pain. Patient rates pain at a 2 out of 10.  Pap Smear:    Pap smear completed today. Patients last Pap smear was January 2011 and normal per patient. No history of an abnormal Pap smear per patient. No Pap smear results in EPIC.   Physical exam: Breasts Breasts symmetrical. Rash bilateral breasts and whole body with multiple lumps. Encouraged patient to follow up with physician in regards to skin. No nipple retraction bilateral breasts. No nipple discharge bilateral breasts. No lymphadenopathy. No lumps palpated bilateral breasts. No lump palpated within patients area of concern left breast. Patient complained of tenderness left inner lower breast on exam. Referred patient to the [facility] for diagnostic mammogram. Appointment scheduled for Thursday, October 10, 2012 at 1500.           Pelvic/Bimanual   Ext Genitalia No lesions, no swelling and no discharge observed on external genitalia.         Vagina Vagina pink and normal texture. No lesions or discharge observed in vagina.          Cervix Cervix is present. Cervix pink and of normal texture. Cervix friable. No discharge observed. IUD strings visualized.     Uterus Uterus is present and palpable. Uterus is retroverted and normal size.        Adnexae Bilateral ovaries present and unable to palpate. No tenderness on palpation.          Rectovaginal No rectal exam completed today since patient had no rectal complaints. No skin abnormalities observed on exam.

## 2012-10-01 NOTE — Patient Instructions (Signed)
Taught Hannah Thompson how to perform BSE and gave educational materials to take home. Let her know [facility] will cover Pap smears every 3 years unless has a history of abnormal Pap smears. Referred patient to the [facility] for diagnostic mammogram. Appointment scheduled for Thursday, October 10, 2012 at 1500. Patient aware of appointment and will be there. Let patient know will follow up with her within the next couple weeks with results by phone. [name] verbalized understanding.  , Kathaleen Maser, RN 3:27 PM

## 2012-10-03 ENCOUNTER — Telehealth (HOSPITAL_COMMUNITY): Payer: Self-pay | Admitting: *Deleted

## 2012-10-03 NOTE — Telephone Encounter (Signed)
Telephoned patient at home # and discussed results of negative pap smear. Next pap due in 3 years. Patient voiced understanding.

## 2012-10-10 ENCOUNTER — Ambulatory Visit
Admission: RE | Admit: 2012-10-10 | Discharge: 2012-10-10 | Disposition: A | Discharge status: Home / Self Care | Payer: No Typology Code available for payment source | Source: Ambulatory Visit | Attending: Obstetrics and Gynecology | Admitting: Obstetrics and Gynecology

## 2012-10-10 ENCOUNTER — Telehealth: Payer: Self-pay | Admitting: *Deleted

## 2012-10-10 DIAGNOSIS — N6320 Unspecified lump in the left breast, unspecified quadrant: Secondary | ICD-10-CM

## 2012-10-10 NOTE — Telephone Encounter (Signed)
Sorry, pt last seen here may 2013, please consider OV with me or with Rene Kocher NP

## 2012-10-10 NOTE — Telephone Encounter (Signed)
ptcalled requesting rx for Prednisone for allergic reaction.  Please advise

## 2012-10-11 NOTE — Telephone Encounter (Signed)
Patient informed. 

## 2013-03-12 ENCOUNTER — Ambulatory Visit (INDEPENDENT_AMBULATORY_CARE_PROVIDER_SITE_OTHER): Payer: No Typology Code available for payment source | Admitting: Internal Medicine

## 2013-03-12 ENCOUNTER — Encounter: Payer: Self-pay | Admitting: Internal Medicine

## 2013-03-12 VITALS — BP 118/70 | HR 72 | Temp 98.6°F | Wt 279.1 lb

## 2013-03-12 DIAGNOSIS — L309 Dermatitis, unspecified: Secondary | ICD-10-CM

## 2013-03-12 DIAGNOSIS — L259 Unspecified contact dermatitis, unspecified cause: Secondary | ICD-10-CM

## 2013-03-12 MED ORDER — RANITIDINE HCL 150 MG PO TABS: 150.0000 mg | ORAL_TABLET | Freq: Two times a day (BID) | ORAL | Status: DC

## 2013-03-12 MED ORDER — HYDROXYZINE HCL 10 MG PO TABS: 10.0000 mg | ORAL_TABLET | Freq: Three times a day (TID) | ORAL | Status: DC | PRN

## 2013-03-12 NOTE — Progress Notes (Signed)
   Subjective:    Patient ID: Hannah Thompson, female    DOB: January 14, 1970, 43 y.o.   MRN: [id number]  HPI  Her symptoms began this morning as a pruritic rash in the peri-orbital areas; this was associated with itchy, watery eyes. There was no specific trigger. She has not changed detergents or cosmetics.   She has a long history of allergies. As a child she had hives with sulfa. There's a possible topical reaction to sulfa-containing shampoo.  Last July she reacted to anti bacterial spray ([employer]) used on furniture. This resulted in persistent rash of both arms and the chest. She continues to have some itching in the area of the rash.She has seen [facility].  She does have epinephrine but has not used it recently. She has no history of asthma.  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   Review of Systems  She has had no swelling of her lips or tongue with the present episode.  She did not have shortness of breath or wheezing.     Objective:   Physical Exam General appearance:weight excess; adequately  nourished; no acute distress or increased work of breathing is present.  No  lymphadenopathy about the head, neck, or axilla noted.   Eyes: No conjunctival inflammation or lid edema is present. There is no scleral icterus. Extraocular motion and vision intact. Slightly papular, bland dermatitis facially.  Ears:  External ear exam shows no significant lesions or deformities.  Otoscopic examination reveals clear canals, tympanic membranes are intact bilaterally without bulging, retraction, inflammation or discharge.  Nose:  External nasal examination shows no deformity or inflammation. Nasal mucosa are pink and moist without lesions or exudates. No septal dislocation or deviation.No obstruction to airflow.   Oral exam: Dental hygiene is good; lips and gums are healthy appearing.There is no oropharyngeal erythema or exudate noted. No lip  edema  Neck:  No deformities,  masses, or tenderness noted.      Heart:  Normal rate and regular rhythm. S1 and S2 normal without gallop, murmur, click, rub or other extra sounds.   Lungs:Chest clear to auscultation; no wheezes, rhonchi,rales ,or rubs present.No increased work of breathing.    Extremities:  No cyanosis, edema, or clubbing  noted    Skin: Warm & dry . She has diffuse hyperpigmentation of the upper extremities and upper thorax. There is slight scattered keratotic changes as well. She has a large keloid over the left posterior shoulder area & R mandible.         Assessment & Plan:  #1 pruritic dermatitis See plan

## 2013-03-12 NOTE — Patient Instructions (Signed)
Apply Cort Aid OTC twice a day to the involved tissues. Do not get this topical steroid into eyes. Use hypoallergenic cleansing motions.  Please contact your insurance company to determine which is the center of excellence on your plan concerning treating dermatologic conditions in African Americans.

## 2013-03-12 NOTE — Progress Notes (Signed)
Pre visit review using our clinic review tool, if applicable. No additional management support is needed unless otherwise documented below in the visit note. 

## 2013-03-13 ENCOUNTER — Telehealth: Payer: Self-pay | Admitting: *Deleted

## 2013-03-13 NOTE — Telephone Encounter (Signed)
Patient is at work & couldn't get to OV prior to office closing.  She works for [employer] and stated she'd have no problem going to ED--knew anaphylaxis sxs.

## 2013-03-13 NOTE — Telephone Encounter (Signed)
   If only. face swollen she could be seen this afternoon as a work in. ER immediately  if tongue or back of throat swollen.

## 2013-03-13 NOTE — Telephone Encounter (Signed)
Patient phoned stating that rather than receiving hydroxyzine, pharmacy filled oxaprozine (per research, it is a NSAID)-after two doses, patient's face is swollen.  Phoned pharmacy prior to phoning our office and pharmacist & was immediately advised to return the bottle to the pharmacy.  When patient got to reviewing the bottle, prescriber info is not for her, nor the patient address.  Patient scheduled OV with TJ for tomorrow morning. Advised patient if breathing became difficult to report to nearest ED.

## 2013-03-14 ENCOUNTER — Other Ambulatory Visit (INDEPENDENT_AMBULATORY_CARE_PROVIDER_SITE_OTHER): Payer: No Typology Code available for payment source

## 2013-03-14 ENCOUNTER — Encounter: Payer: Self-pay | Admitting: Internal Medicine

## 2013-03-14 ENCOUNTER — Ambulatory Visit (INDEPENDENT_AMBULATORY_CARE_PROVIDER_SITE_OTHER): Payer: No Typology Code available for payment source | Admitting: Internal Medicine

## 2013-03-14 VITALS — BP 130/90 | HR 61 | Temp 97.8°F | Resp 16 | Ht 67.0 in | Wt 280.1 lb

## 2013-03-14 DIAGNOSIS — L259 Unspecified contact dermatitis, unspecified cause: Secondary | ICD-10-CM

## 2013-03-14 DIAGNOSIS — D649 Anemia, unspecified: Secondary | ICD-10-CM

## 2013-03-14 DIAGNOSIS — T783XXA Angioneurotic edema, initial encounter: Secondary | ICD-10-CM

## 2013-03-14 DIAGNOSIS — E538 Deficiency of other specified B group vitamins: Secondary | ICD-10-CM

## 2013-03-14 DIAGNOSIS — T7840XA Allergy, unspecified, initial encounter: Secondary | ICD-10-CM

## 2013-03-14 LAB — CBC WITH DIFFERENTIAL/PLATELET
BASOS ABS: 0 10*3/uL (ref 0.0–0.1)
BASOS PCT: 0.7 % (ref 0.0–3.0)
EOS ABS: 0.4 10*3/uL (ref 0.0–0.7)
Eosinophils Relative: 7.9 % — ABNORMAL HIGH (ref 0.0–5.0)
HEMATOCRIT: 35.6 % — AB (ref 36.0–46.0)
HEMOGLOBIN: 11.6 g/dL — AB (ref 12.0–15.0)
LYMPHS ABS: 2.1 10*3/uL (ref 0.7–4.0)
Lymphocytes Relative: 42 % (ref 12.0–46.0)
MCHC: 32.6 g/dL (ref 30.0–36.0)
MCV: 85.5 fl (ref 78.0–100.0)
Monocytes Absolute: 0.3 10*3/uL (ref 0.1–1.0)
Monocytes Relative: 5.8 % (ref 3.0–12.0)
NEUTROS ABS: 2.2 10*3/uL (ref 1.4–7.7)
Neutrophils Relative %: 43.6 % (ref 43.0–77.0)
Platelets: 187 10*3/uL (ref 150.0–400.0)
RBC: 4.17 Mil/uL (ref 3.87–5.11)
RDW: 14.2 % (ref 11.5–14.6)
WBC: 5.1 10*3/uL (ref 4.5–10.5)

## 2013-03-14 LAB — COMPREHENSIVE METABOLIC PANEL
ALT: 16 U/L (ref 0–35)
AST: 13 U/L (ref 0–37)
Albumin: 3.9 g/dL (ref 3.5–5.2)
Alkaline Phosphatase: 61 U/L (ref 39–117)
BILIRUBIN TOTAL: 0.4 mg/dL (ref 0.3–1.2)
BUN: 11 mg/dL (ref 6–23)
CO2: 27 meq/L (ref 19–32)
CREATININE: 0.6 mg/dL (ref 0.4–1.2)
Calcium: 9.2 mg/dL (ref 8.4–10.5)
Chloride: 107 mEq/L (ref 96–112)
GFR: 135.65 mL/min (ref >60.00)
Glucose, Bld: 102 mg/dL — ABNORMAL HIGH (ref 70–99)
Potassium: 4.3 mEq/L (ref 3.5–5.1)
Sodium: 138 mEq/L (ref 135–145)
Total Protein: 7.4 g/dL (ref 6.0–8.3)

## 2013-03-14 LAB — FOLATE: FOLATE: 6.3 ng/mL (ref >5.9)

## 2013-03-14 LAB — SEDIMENTATION RATE: SED RATE: 45 mm/h — AB (ref 0–22)

## 2013-03-14 LAB — VITAMIN B12: VITAMIN B 12: 174 pg/mL — AB (ref 211–911)

## 2013-03-14 LAB — TSH: TSH: 3.24 u[IU]/mL (ref 0.35–5.50)

## 2013-03-14 MED ORDER — CYANOCOBALAMIN 2000 MCG PO TABS: 2000.0000 ug | ORAL_TABLET | Freq: Every day | ORAL | Status: DC

## 2013-03-14 MED ORDER — DESONIDE 0.05 % EX LOTN: TOPICAL_LOTION | Freq: Two times a day (BID) | CUTANEOUS | Status: DC

## 2013-03-14 MED ORDER — METHYLPREDNISOLONE ACETATE 80 MG/ML IJ SUSP
120.0000 mg | Freq: Once | INTRAMUSCULAR | Status: AC
  Administered 2013-03-14: 120 mg via INTRAMUSCULAR

## 2013-03-14 MED ORDER — HYDROXYZINE HCL 50 MG PO TABS: 50.0000 mg | ORAL_TABLET | Freq: Three times a day (TID) | ORAL | Status: DC | PRN

## 2013-03-14 NOTE — Assessment & Plan Note (Addendum)
I will check her CBC and B12 level today  Late note - her B12 level is low , will start a supplement

## 2013-03-14 NOTE — Progress Notes (Signed)
Subjective:    Patient ID: Hannah Thompson, female    DOB: [birth year], 43 y.o.   MRN: [id number]  HPI Comments: She was seen here 2 days ago for rash on her face - she went to the pharmacy to get an antihistamine and they gave her an nsaid (oxaprozin) accidentally, she took it and developed swelling over the left side of her face. She still has the rash and itching on her face and has not treated that.  Allergic Reaction This is a new problem. The current episode started 2 days ago. The problem occurs constantly. The problem is unchanged. The problem is moderate. The patient was exposed to a prescription drug. The time of exposure was just prior to onset. Associated symptoms include itching and a rash. Pertinent negatives include no abdominal pain, chest pain, chest pressure, coughing, diarrhea, difficulty breathing, drooling, eye itching, eye redness, eye watering, globus sensation, hyperventilation, stridor, trouble swallowing, vomiting or wheezing. Swelling is present on the face. Past treatments include nothing. Her past medical history is significant for asthma, atopic dermatitis and seasonal allergies. There is no history of food allergies or medication allergies.      Review of Systems  Constitutional: Negative.  Negative for fever, chills, diaphoresis, appetite change and fatigue.  HENT: Positive for facial swelling. Negative for congestion, drooling, ear pain, nosebleeds, postnasal drip, rhinorrhea, sinus pressure, sneezing, sore throat, trouble swallowing and voice change.   Eyes: Negative.  Negative for redness and itching.  Respiratory: Negative.  Negative for cough, wheezing and stridor.   Cardiovascular: Negative.  Negative for chest pain, palpitations and leg swelling.  Gastrointestinal: Negative.  Negative for vomiting, abdominal pain and diarrhea.  Endocrine: Negative.   Genitourinary: Negative.   Musculoskeletal: Negative.  Negative for back pain.  Skin: Positive for itching  and rash. Negative for color change, pallor and wound.  Allergic/Immunologic: Negative.  Negative for food allergies.  Neurological: Negative.   Hematological: Negative.  Negative for adenopathy. Does not bruise/bleed easily.  Psychiatric/Behavioral: Negative.        Objective:   Physical Exam  Vitals reviewed. Constitutional: She is oriented to person, place, and time. Vital signs are normal. She appears well-developed and well-nourished.  Non-toxic appearance. She does not have a sickly appearance. She does not appear ill. No distress.  HENT:  Head: Normocephalic and atraumatic.    Mouth/Throat: Oropharynx is clear and moist. No oropharyngeal exudate.  Eyes: Conjunctivae are normal. Right eye exhibits no discharge. Left eye exhibits no discharge. No scleral icterus.  Neck: Normal range of motion. Neck supple. No JVD present. No tracheal deviation present. No thyromegaly present.  Cardiovascular: Normal rate, regular rhythm and intact distal pulses.  Exam reveals no gallop and no friction rub.   No murmur heard. Pulmonary/Chest: Effort normal and breath sounds normal. No stridor. No respiratory distress. She has no wheezes. She has no rales. She exhibits no tenderness.  Abdominal: Soft. Bowel sounds are normal. She exhibits no distension and no mass. There is no tenderness. There is no rebound and no guarding.  Musculoskeletal: Normal range of motion. She exhibits no edema and no tenderness.  Lymphadenopathy:    She has no cervical adenopathy.  Neurological: She is oriented to person, place, and time.  Skin: Skin is warm, dry and intact. Rash noted. No purpura noted. Rash is papular. Rash is not macular, not maculopapular, not nodular, not pustular, not vesicular and not urticarial. She is not diaphoretic. No pallor.  She has eczematous patches over her  face, hands, and arms  Psychiatric: She has a normal mood and affect. Her behavior is normal. Judgment and thought content normal.      Lab Results  Component Value Date   WBC 5.3 06/16/2010   HGB 11.4* 06/16/2010   HCT 33.8* 06/16/2010   PLT 175.0 06/16/2010   GLUCOSE 90 06/16/2010   CHOL 159 06/16/2010   TRIG 90.0 06/16/2010   HDL 44.50 06/16/2010   LDLCALC 97 06/16/2010   ALT 13 06/16/2010   AST 15 06/16/2010   NA 140 06/16/2010   K 3.8 06/16/2010   CL 108 06/16/2010   CREATININE 0.6 06/16/2010   BUN 11 06/16/2010   CO2 27 06/16/2010   TSH 2.12 06/16/2010        Assessment & Plan:

## 2013-03-14 NOTE — Assessment & Plan Note (Signed)
She will stop the nsaids She was treated today with depo-medrol IM and will start atarax

## 2013-03-14 NOTE — Assessment & Plan Note (Signed)
I will check her CBC and Vitamin levels today

## 2013-03-14 NOTE — Progress Notes (Signed)
Pre visit review using our clinic review tool, if applicable. No additional management support is needed unless otherwise documented below in the visit note. 

## 2013-03-14 NOTE — Patient Instructions (Signed)
Angioedema Angioedema is a sudden swelling of tissues, often of the skin. It can occur on the face or genitals or in the abdomen or other body parts. The swelling usually develops over a short period and gets better in 24 to 48 hours. It often begins during the night and is found when the person wakes up. The person may also get red, itchy patches of skin (hives). Angioedema can be dangerous if it involves swelling of the air passages.  Depending on the cause, episodes of angioedema may only happen once, come back in unpredictable patterns, or repeat for several years and then gradually fade away.  CAUSES  Angioedema can be caused by an allergic reaction to various triggers. It can also result from nonallergic causes, including reactions to drugs, immune system disorders, viral infections, or an abnormal gene that is passed to you from your parents (hereditary). For some people with angioedema, the cause is unknown.  Some things that can trigger angioedema include:   Foods.   Medicines, such as ACE inhibitors, ARBs, nonsteroidal anti-inflammatory agents, or estrogen.   Latex.   Animal saliva.   Insect stings.   Dyes used in X-rays.   Mild injury.   Dental work.  Surgery.  Stress.   Sudden changes in temperature.   Exercise. SIGNS AND SYMPTOMS   Swelling of the skin.  Hives. If these are present, there is also intense itching.  Redness in the affected area.   Pain in the affected area.  Swollen lips or tongue.  Breathing problems. This may happen if the air passages swell.  Wheezing. If internal organs are involved, there may be:   Nausea.   Abdominal pain.   Vomiting.   Difficulty swallowing.   Difficulty passing urine. DIAGNOSIS   Your health care provider will examine the affected area and take a medical and family history.  Various tests may be done to help determine the cause. Tests may include:  Allergy skin tests to see if the problem  is an allergic reaction.   Blood tests to check for hereditary angioedema.   Tests to check for underlying diseases that could cause the condition.   A review of your medicines, including over the counter medicines, may be done. TREATMENT  Treatment will depend on the cause of the angioedema. Possible treatments include:   Removal of anything that triggered the condition (such as stopping certain medicines).   Medicines to treat symptoms or prevent attacks. Medicines given may include:   Antihistamines.   Epinephrine injection.   Steroids.   Hospitalization may be required for severe attacks. If the air passages are affected, it can be an emergency. Tubes may need to be placed to keep the airway open. HOME CARE INSTRUCTIONS   Only take over-the-counter or prescription medicines as directed by your health care provider.  If you were given medicines for emergency allergy treatment, always carry them with you.  Wear a medical bracelet as directed by your health care provider.   Avoid known triggers. SEEK MEDICAL CARE IF:   You have repeat attacks of angioedema.   Your attacks are more frequent or more severe despite preventive measures.   You have hereditary angioedema and are considering having children. It is important to discuss the risks of passing the condition on to your children with your health care provider. SEEK IMMEDIATE MEDICAL CARE IF:   You have severe swelling of the mouth, tongue, or lips.  You have difficulty breathing.   You have difficulty swallowing.     You faint. MAKE SURE YOU:  Understand these instructions.  Will watch your condition.  Will get help right away if you are not doing well or get worse. Document Released: 02/27/2001 Document Revised: 10/09/2012 Document Reviewed: 08/12/2012 Exit[employer] Patient Information 2014 [employer].  

## 2013-03-14 NOTE — Addendum Note (Signed)
Addended by: Etta GrandchildJONES,  L on: 03/14/2013 11:58 AM   Modules accepted: Orders

## 2013-03-14 NOTE — Assessment & Plan Note (Signed)
Will treat with a dose of depo-medrol IM and atarax She will also use topical steroids

## 2013-03-18 ENCOUNTER — Encounter: Payer: Self-pay | Admitting: Internal Medicine

## 2013-03-18 ENCOUNTER — Ambulatory Visit (INDEPENDENT_AMBULATORY_CARE_PROVIDER_SITE_OTHER): Payer: No Typology Code available for payment source | Admitting: Internal Medicine

## 2013-03-18 VITALS — BP 114/84 | HR 74 | Temp 97.5°F | Ht 67.0 in | Wt 280.0 lb

## 2013-03-18 DIAGNOSIS — L259 Unspecified contact dermatitis, unspecified cause: Secondary | ICD-10-CM

## 2013-03-18 DIAGNOSIS — Z0000 Encounter for general adult medical examination without abnormal findings: Secondary | ICD-10-CM

## 2013-03-18 DIAGNOSIS — J45909 Unspecified asthma, uncomplicated: Secondary | ICD-10-CM

## 2013-03-18 DIAGNOSIS — E538 Deficiency of other specified B group vitamins: Secondary | ICD-10-CM

## 2013-03-18 DIAGNOSIS — R21 Rash and other nonspecific skin eruption: Secondary | ICD-10-CM

## 2013-03-18 MED ORDER — METHYLPREDNISOLONE ACETATE 80 MG/ML IJ SUSP
80.0000 mg | Freq: Once | INTRAMUSCULAR | Status: AC
  Administered 2013-03-18: 80 mg via INTRAMUSCULAR

## 2013-03-18 MED ORDER — CYANOCOBALAMIN 1000 MCG/ML IJ SOLN
1000.0000 ug | Freq: Once | INTRAMUSCULAR | Status: AC
  Administered 2013-03-18: 1000 ug via INTRAMUSCULAR

## 2013-03-18 MED ORDER — PREDNISONE 10 MG PO TABS: ORAL_TABLET | ORAL | Status: DC

## 2013-03-18 NOTE — Assessment & Plan Note (Signed)
For b12 IM today, then resume po

## 2013-03-18 NOTE — Assessment & Plan Note (Signed)
For depomedrol IM repeat x 1

## 2013-03-18 NOTE — Progress Notes (Signed)
Subjective:    Patient ID: Hannah Thompson, female    DOB: 03-May-1970, 43 y.o.   MRN: [id number]  HPI  Seen twice recently, apparently dispensed oxaprozin at pharmacy which led to signficant angioedeama, improved but still mild persistent with marked itching since seen per Dr Yetta Barre.  No lip/tongue swelling and Pt denies chest pain, increased sob or doe, wheezing, orthopnea, PND, increased LE swelling, palpitations, dizziness or syncope.  Pt denies new neurological symptoms such as new headache, or facial or extremity weakness or numbness  Pt denies polydipsia, polyuria,  Incidnetly b12 low as well. Past Medical History  Diagnosis Date  . Headache(784.0) 06/21/2009    recurrent  . VITAMIN B12 DEFICIENCY 03/18/2009  . ASTHMA 03/17/2009  . ALLERGIC RHINITIS 03/17/2009  . ECZEMA 03/17/2009  . DISC DISEASE, LUMBAR 03/17/2009    mild facet arthropathy by MRI 2005  . HYPERLIPIDEMIA 03/17/2009  . ANEMIA-NOS 03/17/2009  . KELOID 06/21/2009   Past Surgical History  Procedure Laterality Date  . Keloid excision  mid 1990's    s/p keloid removal-laser     reports that she has quit smoking. She has never used smokeless tobacco. She reports that she does not drink alcohol or use illicit drugs. family history includes Alcohol abuse in her other; Allergies in her sister; Cancer in her father, other, and paternal aunt; Diabetes in her mother and other; Heart disease in her other; Hypertension in her father and mother. Allergies  Allergen Reactions  . Oxaprozin     angioedema  . Sulfonamide Derivatives     REACTION: hives   Current Outpatient Prescriptions on File Prior to Visit  Medication Sig Dispense Refill  . cyanocobalamin 2000 MCG tablet Take 1 tablet (2,000 mcg total) by mouth daily.  90 tablet  3  . desonide (DESOWEN) 0.05 % lotion Apply topically 2 (two) times daily.  59 mL  2  . EPINEPHrine (EPI-PEN) 0.3 mg/0.3 mL DEVI Inject 0.3 mg into the muscle daily as needed (Anaphylaxis).      .  hydrOXYzine (ATARAX/VISTARIL) 50 MG tablet Take 1 tablet (50 mg total) by mouth 3 (three) times daily as needed.  65 tablet  1  . ranitidine (ZANTAC) 150 MG tablet Take 1 tablet (150 mg total) by mouth 2 (two) times daily.  60 tablet  0   No current facility-administered medications on file prior to visit.   Review of Systems  Constitutional: Negative for unexpected weight change, or unusual diaphoresis  HENT: Negative for tinnitus.   Eyes: Negative for photophobia and visual disturbance.  Respiratory: Negative for choking and stridor.   Gastrointestinal: Negative for vomiting and blood in stool.  Genitourinary: Negative for hematuria and decreased urine volume.  Musculoskeletal: Negative for acute joint swelling Skin: Negative for color change and wound.  Neurological: Negative for tremors and numbness other than noted  Psychiatric/Behavioral: Negative for decreased concentration or  hyperactivity.       Objective:   Physical Exam BP 114/84  Pulse 74  Temp(Src) 97.5 F (36.4 C) (Oral)  Ht 5\' 7"  (1.702 m)  Wt 280 lb (127.007 kg)  BMI 43.84 kg/m2  SpO2 95% VS noted,  Constitutional: Pt appears well-developed and well-nourished.  HENT: Head: NCAT.  Right Ear: External ear normal.  Left Ear: External ear normal.  Eyes: Conjunctivae and EOM are normal. Pupils are equal, round, and reactive to light.  Neck: Normal range of motion. Neck supple.  Cardiovascular: Normal rate and regular rhythm.   Pulmonary/Chest: Effort normal and breath  sounds normal.  Abd:  Soft, NT, non-distended, + BS Neurological: Pt is alert. Not confused  Skin: diffuse eczema with mild facial angioedema persistent Psychiatric: Pt behavior is normal. Thought content normal.       Assessment & Plan:

## 2013-03-18 NOTE — Progress Notes (Signed)
Pre visit review using our clinic review tool, if applicable. No additional management support is needed unless otherwise documented below in the visit note. 

## 2013-03-18 NOTE — Patient Instructions (Addendum)
Please take all new medication as prescribed - the prednisone  You had the steroid and B12 shots today  Please continue all other medications as before, and refills have been done if requested. Please have the pharmacy call with any other refills you may need.  Please return in 3 months, or sooner if needed, with Lab testing done 3-5 days before

## 2013-03-18 NOTE — Assessment & Plan Note (Signed)
For rash and eczema  - for short course predpack asd

## 2013-03-18 NOTE — Assessment & Plan Note (Signed)
stable overall by history and exam, recent data reviewed with pt, and pt to continue medical treatment as before,  to f/u any worsening symptoms or concerns SpO2 Readings from Last 3 Encounters:  03/18/13 95%  03/14/13 99%  03/12/13 97%

## 2013-04-10 ENCOUNTER — Telehealth: Payer: Self-pay | Admitting: Internal Medicine

## 2013-04-10 DIAGNOSIS — R21 Rash and other nonspecific skin eruption: Secondary | ICD-10-CM

## 2013-04-10 NOTE — Telephone Encounter (Signed)
Done per emr 

## 2013-04-10 NOTE — Telephone Encounter (Signed)
Patient states that she is still having an issue with her rash and is asking for a referral to see a dermatologist. Please advise.

## 2013-04-11 ENCOUNTER — Telehealth: Payer: Self-pay | Admitting: Internal Medicine

## 2013-04-11 DIAGNOSIS — L259 Unspecified contact dermatitis, unspecified cause: Secondary | ICD-10-CM

## 2013-04-11 DIAGNOSIS — T783XXA Angioneurotic edema, initial encounter: Secondary | ICD-10-CM

## 2013-04-11 MED ORDER — HYDROXYZINE HCL 50 MG PO TABS: 50.0000 mg | ORAL_TABLET | Freq: Three times a day (TID) | ORAL | Status: DC | PRN

## 2013-04-11 NOTE — Telephone Encounter (Signed)
Patient informed. 

## 2013-04-11 NOTE — Telephone Encounter (Signed)
Pt wants something called to relieve itching until she can see the dermatologist on April 15.

## 2013-04-11 NOTE — Telephone Encounter (Signed)
Re-rx atarax prn, can also use benadryl cream otc prn topical

## 2013-05-14 ENCOUNTER — Encounter: Payer: Self-pay | Admitting: Internal Medicine

## 2013-05-27 ENCOUNTER — Ambulatory Visit: Payer: No Typology Code available for payment source | Admitting: Internal Medicine

## 2013-05-30 ENCOUNTER — Ambulatory Visit (INDEPENDENT_AMBULATORY_CARE_PROVIDER_SITE_OTHER)
Admission: RE | Admit: 2013-05-30 | Discharge: 2013-05-30 | Disposition: A | Discharge status: Home / Self Care | Payer: No Typology Code available for payment source | Source: Ambulatory Visit | Attending: Internal Medicine | Admitting: Internal Medicine

## 2013-05-30 ENCOUNTER — Ambulatory Visit (INDEPENDENT_AMBULATORY_CARE_PROVIDER_SITE_OTHER): Payer: No Typology Code available for payment source | Admitting: Internal Medicine

## 2013-05-30 ENCOUNTER — Encounter: Payer: Self-pay | Admitting: Internal Medicine

## 2013-05-30 VITALS — BP 104/78 | HR 65 | Temp 98.2°F | Ht 67.0 in | Wt 278.4 lb

## 2013-05-30 DIAGNOSIS — M25559 Pain in unspecified hip: Secondary | ICD-10-CM

## 2013-05-30 DIAGNOSIS — J45909 Unspecified asthma, uncomplicated: Secondary | ICD-10-CM

## 2013-05-30 DIAGNOSIS — M79601 Pain in right arm: Secondary | ICD-10-CM | POA: Insufficient documentation

## 2013-05-30 DIAGNOSIS — M25551 Pain in right hip: Secondary | ICD-10-CM

## 2013-05-30 DIAGNOSIS — E669 Obesity, unspecified: Secondary | ICD-10-CM

## 2013-05-30 MED ORDER — NAPROXEN 500 MG PO TABS: 500.0000 mg | ORAL_TABLET | Freq: Two times a day (BID) | ORAL | Status: DC

## 2013-05-30 NOTE — Patient Instructions (Addendum)
Please take all new medication as prescribed Please continue all other medications as before, and refills have been done if requested. Please have the pharmacy call with any other refills you may need.  Please go to the XRAY Department in the Basement (go straight as you get off the elevator) for the x-ray testing  Please make appt with Dr Katrinka Blazing for next wk (soon) for right hip pain  Please continue your efforts at being more active, low cholesterol diet, and weight control.

## 2013-05-30 NOTE — Progress Notes (Signed)
Pre visit review using our clinic review tool, if applicable. No additional management support is needed unless otherwise documented below in the visit note. 

## 2013-05-30 NOTE — Progress Notes (Signed)
Subjective:    Patient ID: Hannah Thompson, female    DOB: 1970/06/28, 43 y.o.   MRN: [id number]  HPI  Here to f/u, overall doing ok except for 2 mo gradually worsening right groin pain, no mod, constant, worse to walks and limps, no falls.  Pt denies chest pain, increased sob or doe, wheezing, orthopnea, PND, increased LE swelling, palpitations, dizziness or syncope.  Pt denies new neurological symptoms such as new headache, or facial or extremity weakness or numbness  Denies worsening reflux, abd pain, dysphagia, n/v, bowel change or blood.  Denies urinary symptoms such as dysuria, frequency, urgency, flank pain, hematuria or n/v, fever, chills. Past Medical History  Diagnosis Date  . Headache(784.0) 06/21/2009    recurrent  . VITAMIN B12 DEFICIENCY 03/18/2009  . ASTHMA 03/17/2009  . ALLERGIC RHINITIS 03/17/2009  . ECZEMA 03/17/2009  . DISC DISEASE, LUMBAR 03/17/2009    mild facet arthropathy by MRI 2005  . HYPERLIPIDEMIA 03/17/2009  . ANEMIA-NOS 03/17/2009  . KELOID 06/21/2009   Past Surgical History  Procedure Laterality Date  . Keloid excision  mid 1990's    s/p keloid removal-laser     reports that she has quit smoking. She has never used smokeless tobacco. She reports that she does not drink alcohol or use illicit drugs. family history includes Alcohol abuse in her other; Allergies in her sister; Cancer in her father, other, and paternal aunt; Diabetes in her mother and other; Heart disease in her other; Hypertension in her father and mother. Allergies  Allergen Reactions  . Oxaprozin     angioedema  . Sulfonamide Derivatives     REACTION: hives   Current Outpatient Prescriptions on File Prior to Visit  Medication Sig Dispense Refill  . ranitidine (ZANTAC) 150 MG tablet Take 1 tablet (150 mg total) by mouth 2 (two) times daily.  60 tablet  0  . cyanocobalamin 2000 MCG tablet Take 1 tablet (2,000 mcg total) by mouth daily.  90 tablet  3  . desonide (DESOWEN) 0.05 % lotion Apply  topically 2 (two) times daily.  59 mL  2  . EPINEPHrine (EPI-PEN) 0.3 mg/0.3 mL DEVI Inject 0.3 mg into the muscle daily as needed (Anaphylaxis).      . hydrOXYzine (ATARAX/VISTARIL) 50 MG tablet Take 1 tablet (50 mg total) by mouth 3 (three) times daily as needed.  65 tablet  1   No current facility-administered medications on file prior to visit.   Review of Systems  Constitutional: Negative for unusual diaphoresis or other sweats  HENT: Negative for ringing in ear Eyes: Negative for double vision or worsening visual disturbance.  Respiratory: Negative for choking and stridor.   Gastrointestinal: Negative for vomiting or other signifcant bowel change Genitourinary: Negative for hematuria or decreased urine volume.  Musculoskeletal: Negative for other MSK pain or swelling Skin: Negative for color change and worsening wound.  Neurological: Negative for tremors and numbness other than noted  Psychiatric/Behavioral: Negative for decreased concentration or agitation other than above       Objective:   Physical Exam BP 104/78  Pulse 65  Temp(Src) 98.2 F (36.8 C) (Oral)  Ht 5\' 7"  (1.702 m)  Wt 278 lb 6 oz (126.27 kg)  BMI 43.59 kg/m2  SpO2 97% VS noted, not ill appearing Constitutional: Pt appears well-developed, well-nourished.  HENT: Head: NCAT.  Right Ear: External ear normal.  Left Ear: External ear normal.  Eyes: . Pupils are equal, round, and reactive to light. Conjunctivae and EOM are normal  Neck: Normal range of motion. Neck supple.  Cardiovascular: Normal rate and regular rhythm.   Pulmonary/Chest: Effort normal and breath sounds normal.  Abd:  Soft, NT, ND, + BS No groin swelling, mass, rash or skin change Right hip with FROM to flexion and ext rotation, + pain/limited ROM to int rotation Neurological: Pt is alert. Not confused , motor grossly intact Skin: Skin is warm. No rash Psychiatric: Pt behavior is normal. No agitation.     Assessment & Plan:

## 2013-05-31 NOTE — Assessment & Plan Note (Signed)
Encouraged wt loss if able, also cant r/o neuritic compression right groin as etiology of pain

## 2013-05-31 NOTE — Assessment & Plan Note (Signed)
?   Hip arthritis vs soft tissue injury, for hip film, refer Dr Smith/sport medicine,  to f/u any worsening symptoms or concerns

## 2013-05-31 NOTE — Assessment & Plan Note (Signed)
stable overall by history and exam, recent data reviewed with pt, and pt to continue medical treatment as before,  to f/u any worsening symptoms or concerns SpO2 Readings from Last 3 Encounters:  05/30/13 97%  03/18/13 95%  03/14/13 99%

## 2013-06-03 ENCOUNTER — Ambulatory Visit: Payer: No Typology Code available for payment source | Admitting: Family Medicine

## 2013-06-03 ENCOUNTER — Telehealth: Payer: Self-pay | Admitting: Internal Medicine

## 2013-06-03 NOTE — Telephone Encounter (Signed)
Called the patient informed of results 

## 2013-06-03 NOTE — Telephone Encounter (Signed)
PT CAME IN AND STATED SHE HASN'T RECEIVED CALL ABOUT XRAY RESULTS FROM PREVIOUS VISIT AND WOULD LIKE TO BE CONTACTED TO REVIEW THE RESULTS.

## 2013-06-11 ENCOUNTER — Other Ambulatory Visit (INDEPENDENT_AMBULATORY_CARE_PROVIDER_SITE_OTHER): Payer: No Typology Code available for payment source

## 2013-06-11 ENCOUNTER — Ambulatory Visit (INDEPENDENT_AMBULATORY_CARE_PROVIDER_SITE_OTHER): Payer: No Typology Code available for payment source | Admitting: Family Medicine

## 2013-06-11 ENCOUNTER — Encounter: Payer: Self-pay | Admitting: Family Medicine

## 2013-06-11 VITALS — BP 102/82 | HR 64 | Ht 67.0 in | Wt 272.0 lb

## 2013-06-11 DIAGNOSIS — M25551 Pain in right hip: Secondary | ICD-10-CM

## 2013-06-11 DIAGNOSIS — M25559 Pain in unspecified hip: Secondary | ICD-10-CM

## 2013-06-11 NOTE — Patient Instructions (Signed)
Nice to meet you We are going to treat it as a hip flexor strain but you do have fluid in the joint as well.  Ice 20 minutes 2 times a day Naproxen 2 times daily for next 10 days  Turmeric 500mg  twice daily.  Exercises 3-4 times a week.  Walking on track 3 times a week only for now and ice afterwards.  Come back in 2-3 weeks and we will see how we are doing.

## 2013-06-11 NOTE — Assessment & Plan Note (Signed)
Patient's pain may be secondary to a labral tear or hopefully just a hip flexor strain. Patient would like to try conservative therapy at first. Patient will try anti-inflammatories [place] basis, icing protocol, and patient was given home exercise program. Patient is to try this and some over-the-counter medications and come back again in 3 weeks time. The patient continues to have pain I would recommend patient having a corticosteroid injection intra-articular the hip for diagnostic and hopefully therapeutic purposes. Encourage patient to crosstraining and help her lose weight. Patient will avoid any jumping or any chronic pounding for now.

## 2013-06-11 NOTE — Progress Notes (Signed)
[name] [address] [place], [place] [address] Phone: [phone] Subjective:    I'm seeing this patient by the request  of:  Oliver Barre, MD   CC:  Right hip pain  BEE:[id number] Hannah Thompson is a 43 y.o. female coming in with complaint of right hip pain. Patient states that she has had it for several months but now seems to be increasing over the course of last couple weeks. Patient states that she has been attempting to lose weight and has lost about 12 pounds of the course last 3 weeks. Patient though has been walking more and has noted some more discomfort. Patient states that now even with standing she can have some discomfort. Patient states it seems to be mostly in the groin area without any significant radiation to the legs or any numbness or tingling. Patient feels that if she tries to lift her leg sometimes he can feel some mild weakness. Denies any back pain that is associated with it. Patient states that the pain can be so severe that it makes her want to almost fall and protected at 10 out of 10 pain.     Past medical history, social, surgical and family history all reviewed in electronic medical record.   Review of Systems: No headache, visual changes, nausea, vomiting, diarrhea, constipation, dizziness, abdominal pain, skin rash, fevers, chills, night sweats, weight loss, swollen lymph nodes, body aches, joint swelling, muscle aches, chest pain, shortness of breath, mood changes.   Objective Blood pressure 102/82, pulse 64, height 5\' 7"  (1.702 m), weight 272 lb (123.378 kg), SpO2 98.00%.  General: No apparent distress alert and oriented x3 mood and affect normal, dressed appropriately. obese HEENT: Pupils equal, extraocular movements intact  Respiratory: Patient's speak in full sentences and does not appear short of breath  Cardiovascular: No lower extremity edema, non tender, no erythema  Skin: Warm dry intact with no signs of infection  or rash on extremities or on axial skeleton.  Abdomen: Soft nontender  Neuro: Cranial nerves II through XII are intact, neurovascularly intact in all extremities with 2+ DTRs and 2+ pulses.  Lymph: No lymphadenopathy of posterior or anterior cervical chain or axillae bilaterally.  Gait normal with good balance and coordination.  MSK:  Non tender with full range of motion and good stability and symmetric strength and tone of shoulders, elbows, wrist,  knee and ankles bilaterally.  Hip: Right ROM IR: 35 Deg, ER: 35 Deg, Flexion: 120 Deg, Extension: 100 Deg, Abduction: 35 Deg, Adduction: 35 Deg Strength IR: 5/5, ER: 5/5, Flexion: 3/5, Extension: 5/5, Abduction: 4/5, Adduction: 3/5 Pelvic alignment unremarkable to inspection and palpation. Standing hip rotation and gait without trendelenburg sign / unsteadiness. Greater trochanter without tenderness to palpation. No tenderness over piriformis and greater trochanter. Positive pain with FABER and FADIR. No SI joint tenderness and normal minimal SI movement. Contralateral hip unremarkable  MSK US performed of: Right hip This study was ordered, performed, and interpreted by Terrilee Files D.O.  Hip: Trochanteric bursa without swelling or effusion. Acetabular labrum visualized and does appear to have potential tear anteriorly. There is significant hypoechoic changes and does appear to have a very small effusion of the joint. Femoral neck appears unremarkable without increased power doppler signal along Cortex. Patient's hip flexor also shows that there is some increasing Doppler flow  IMPRESSION:  Small joint effusion with questionable labral tear as well as questionable flexor tendinopathy.    Impression and Recommendations:  This case required medical decision making of moderate complexity.

## 2013-06-18 ENCOUNTER — Telehealth: Payer: Self-pay | Admitting: Internal Medicine

## 2013-06-18 ENCOUNTER — Telehealth: Payer: Self-pay

## 2013-06-18 ENCOUNTER — Encounter: Payer: No Typology Code available for payment source | Admitting: Internal Medicine

## 2013-06-18 DIAGNOSIS — Z0289 Encounter for other administrative examinations: Secondary | ICD-10-CM

## 2013-06-18 MED ORDER — TRAMADOL HCL 50 MG PO TABS: 50.0000 mg | ORAL_TABLET | Freq: Two times a day (BID) | ORAL | Status: DC | PRN

## 2013-06-18 NOTE — Telephone Encounter (Signed)
Please tell her to stop medicine. Will send in some tramadol to help but will make her sleepy.

## 2013-06-18 NOTE — Telephone Encounter (Signed)
Patient did not show up for cpe today.  Please advise.

## 2013-06-18 NOTE — Telephone Encounter (Signed)
Pt states that the naproxen 500 mg that was prescribed, she may have a possible allergy to it.  She has broken out in a rash on her face, pt would like to know if there is something else that she can take for her pain. Please advise

## 2013-06-18 NOTE — Telephone Encounter (Signed)
Discussed with pt

## 2013-06-18 NOTE — Telephone Encounter (Signed)
Ok to run a no show report

## 2013-06-19 NOTE — Telephone Encounter (Signed)
One no show only, ok to call pt to offer f/u appt

## 2013-06-19 NOTE — Telephone Encounter (Signed)
Hannah Thompson ran no show report does she need to be rescheduled if so note needs to return back to a scheduler.

## 2013-07-02 ENCOUNTER — Telehealth: Payer: Self-pay | Admitting: Family Medicine

## 2013-07-02 ENCOUNTER — Ambulatory Visit: Payer: No Typology Code available for payment source | Admitting: Family Medicine

## 2013-07-02 DIAGNOSIS — Z0289 Encounter for other administrative examinations: Secondary | ICD-10-CM

## 2013-07-02 NOTE — Telephone Encounter (Signed)
You pt did not show for the appt today. Please advise.

## 2013-07-03 NOTE — Telephone Encounter (Signed)
Noted  

## 2013-07-09 ENCOUNTER — Other Ambulatory Visit (INDEPENDENT_AMBULATORY_CARE_PROVIDER_SITE_OTHER): Payer: No Typology Code available for payment source

## 2013-07-09 ENCOUNTER — Ambulatory Visit (INDEPENDENT_AMBULATORY_CARE_PROVIDER_SITE_OTHER): Payer: No Typology Code available for payment source | Admitting: Family Medicine

## 2013-07-09 ENCOUNTER — Encounter: Payer: Self-pay | Admitting: Family Medicine

## 2013-07-09 VITALS — BP 112/80 | HR 54 | Ht 67.0 in | Wt 271.0 lb

## 2013-07-09 DIAGNOSIS — M25551 Pain in right hip: Secondary | ICD-10-CM

## 2013-07-09 DIAGNOSIS — M25559 Pain in unspecified hip: Secondary | ICD-10-CM

## 2013-07-09 NOTE — Assessment & Plan Note (Signed)
Patient was given an injection in the hip today and states that it this did give almost near complete resolution of pain. This is diagnostically that this is likely a intra-articular hip problem. Patient will also start formal physical therapy and prescription was given. We discussed continuing the icing and home exercises in the meantime as well as the over-the-counter medications. Patient then is going to come back and see me again in 4 weeks for further evaluation. Patient continues to have pain we may need to consider further imaging.  Spent greater than 25 minutes with patient face-to-face and had greater than 50% of counseling including as described above in assessment and plan.

## 2013-07-09 NOTE — Patient Instructions (Signed)
Good to se eyou We did an injection in your hip I think this will help.  Continue the exercises 3 times a week Physical therapy will be calling you.  Keep trying to lose that weight.  Come back in 4 weeks.

## 2013-07-09 NOTE — Progress Notes (Signed)
Hannah Thompson D.O. [facility] [address] [place], [place] [address] Phone: [phone] Subjective:    I'm seeing this patient by the request  of:  Oliver BarreJames John, MD   CC:  Right hip pain  NFA:[id number]:Subjective Hannah LightsWanda R Thompson is a 43 y.o. female coming in with complaint of right hip pain. Patient has been very aggressive attempting to lose weight but has noticed that with increasing locking she started having increasing pain in the groin area. Patient states it seems to be getting worse and now is giving her trouble even when she stands for long amount of time. There is a questionable concern for a labral tear previously. Patient has had x-rays that did not show any significant bony abnormality. Denies any radiation of the leg or any numbness. Patient states that it seems to be getting worse from previous visit. No nighttime awakening.    Past medical history, social, surgical and family history all reviewed in electronic medical record.   Review of Systems: No headache, visual changes, nausea, vomiting, diarrhea, constipation, dizziness, abdominal pain, skin rash, fevers, chills, night sweats, weight loss, swollen lymph nodes, body aches, joint swelling, muscle aches, chest pain, shortness of breath, mood changes.   Objective Blood pressure 112/80, pulse 54, height 5\' 7"  (1.702 m), weight 271 lb (122.925 kg), SpO2 99.00%.  General: No apparent distress alert and oriented x3 mood and affect normal, dressed appropriately. obese HEENT: Pupils equal, extraocular movements intact  Respiratory: Patient's speak in full sentences and does not appear short of breath  Cardiovascular: No lower extremity edema, non tender, no erythema  Skin: Warm dry intact with no signs of infection or rash on extremities or on axial skeleton.  Abdomen: Soft nontender  Neuro: Cranial nerves II through XII are intact, neurovascularly intact in all extremities with 2+ DTRs and 2+ pulses.  Lymph: No lymphadenopathy  of posterior or anterior cervical chain or axillae bilaterally.  Gait normal with good balance and coordination.  MSK:  Non tender with full range of motion and good stability and symmetric strength and tone of shoulders, elbows, wrist,  knee and ankles bilaterally.  Hip: Right ROM IR: 25 Deg, ER: 35 Deg, Flexion: 120 Deg, Extension: 100 Deg, Abduction: 35 Deg, Adduction: 35 Deg Strength IR: 5/5, ER: 5/5, Flexion: 3/5, Extension: 5/5, Abduction: 4/5, Adduction: 3/5 Pelvic alignment unremarkable to inspection and palpation. Standing hip rotation and gait without trendelenburg sign / unsteadiness. Greater trochanter without tenderness to palpation. No tenderness over piriformis and greater trochanter. Positive pain with FABER and FADIR. No SI joint tenderness and normal minimal SI movement. Contralateral hip unremarkable  MSK US performed of: Right hip This study was ordered, performed, and interpreted by Terrilee FilesZach Thompson D.O.  Hip: Trochanteric bursa without swelling or effusion. Acetabular labrum visualized and does appear to have potential tear anteriorly still present. There is significant hypoechoic changes and does appear to have a very small effusion of the joint. Femoral neck appears unremarkable without increased power doppler signal along Cortex. Patient's hip flexor also shows that there is some increasing Doppler flow  IMPRESSION:  Small joint effusion with questionable labral tear   Procedure: Real-time Ultrasound Guided Injection of right hip Device: GE Logiq E  Ultrasound guided injection is preferred based studies that show increased duration, increased effect, greater accuracy, decreased procedural pain, increased response rate with ultrasound guided versus blind injection.  Verbal informed consent obtained.  Time-out conducted.  Noted no overlying erythema, induration, or other signs of local infection.  Skin prepped in a sterile fashion.  Local anesthesia: Topical Ethyl  chloride.  With sterile technique and under real time ultrasound guidance:  Anterior capsule visualized, needle visualized going to the head neck junction at the anterior capsule. Pictures taken. Patient did have injection of 3 cc of 1% lidocaine, 3 cc of 0.5% Marcaine, and 1 cc of Kenalog 40 mg/dL. Completed without difficulty  Pain immediately resolved suggesting accurate placement of the medication.  Advised to call if fevers/chills, erythema, induration, drainage, or persistent bleeding.  Images permanently stored and available for review in the ultrasound unit.  Impression: Technically successful ultrasound guided injection.     Impression and Recommendations:     This case required medical decision making of moderate complexity.

## 2013-07-24 ENCOUNTER — Ambulatory Visit: Payer: No Typology Code available for payment source | Attending: Family Medicine | Admitting: Physical Therapy

## 2013-07-24 DIAGNOSIS — M25559 Pain in unspecified hip: Secondary | ICD-10-CM | POA: Insufficient documentation

## 2013-07-24 DIAGNOSIS — IMO0001 Reserved for inherently not codable concepts without codable children: Secondary | ICD-10-CM | POA: Insufficient documentation

## 2013-07-30 ENCOUNTER — Encounter: Payer: Self-pay | Admitting: Internal Medicine

## 2013-07-30 ENCOUNTER — Ambulatory Visit (INDEPENDENT_AMBULATORY_CARE_PROVIDER_SITE_OTHER): Payer: No Typology Code available for payment source | Admitting: Internal Medicine

## 2013-07-30 VITALS — BP 120/78 | HR 65 | Temp 98.3°F | Ht 67.0 in | Wt 260.0 lb

## 2013-07-30 DIAGNOSIS — Z0000 Encounter for general adult medical examination without abnormal findings: Secondary | ICD-10-CM

## 2013-07-30 DIAGNOSIS — J45909 Unspecified asthma, uncomplicated: Secondary | ICD-10-CM

## 2013-07-30 DIAGNOSIS — E538 Deficiency of other specified B group vitamins: Secondary | ICD-10-CM

## 2013-07-30 DIAGNOSIS — D649 Anemia, unspecified: Secondary | ICD-10-CM

## 2013-07-30 DIAGNOSIS — J309 Allergic rhinitis, unspecified: Secondary | ICD-10-CM

## 2013-07-30 MED ORDER — CYANOCOBALAMIN 1000 MCG/ML IJ SOLN
1000.0000 ug | Freq: Once | INTRAMUSCULAR | Status: AC
  Administered 2013-07-30: 1000 ug via INTRAMUSCULAR

## 2013-07-30 NOTE — Progress Notes (Signed)
Pre visit review using our clinic review tool, if applicable. No additional management support is needed unless otherwise documented below in the visit note. 

## 2013-07-30 NOTE — Progress Notes (Signed)
Subjective:    Patient ID: Hannah Thompson, female    DOB: 13-Oct-1970, 43 y.o.   MRN: [id number]  HPI  Here to f/u; overall doing ok,  Pt denies chest pain, increased sob or doe, wheezing, orthopnea, PND, increased LE swelling, palpitations, dizziness or syncope.  Pt denies polydipsia, polyuria, or low sugar symptoms such as weakness or confusion improved with po intake.  Pt denies new neurological symptoms such as new headache, or facial or extremity weakness or numbness.   Pt states overall good compliance with meds, has been trying to follow lower cholesterol diet, with wt overall stable,  but little exercise however. Did see Dr Katrinka Blazing earlier this mo, with injection and referral for PT.   Due for b12 shot today - low again 4 mo ago despite oral b12. Does have several wks ongoing nasal allergy symptoms with clearish congestion, itch and sneezing, without fever, pain, ST, cough, swelling or wheezing, but better with OTC allegra. Past Medical History  Diagnosis Date  . Headache(784.0) 06/21/2009    recurrent  . VITAMIN B12 DEFICIENCY 03/18/2009  . ASTHMA 03/17/2009  . ALLERGIC RHINITIS 03/17/2009  . ECZEMA 03/17/2009  . DISC DISEASE, LUMBAR 03/17/2009    mild facet arthropathy by MRI 2005  . HYPERLIPIDEMIA 03/17/2009  . ANEMIA-NOS 03/17/2009  . KELOID 06/21/2009   Past Surgical History  Procedure Laterality Date  . Keloid excision  mid 1990's    s/p keloid removal-laser     reports that she has quit smoking. She has never used smokeless tobacco. She reports that she does not drink alcohol or use illicit drugs. family history includes Alcohol abuse in her other; Allergies in her sister; Cancer in her father, other, and paternal aunt; Diabetes in her mother and other; Heart disease in her other; Hypertension in her father and mother. Allergies  Allergen Reactions  . Oxaprozin     angioedema  . Sulfonamide Derivatives     REACTION: hives  . Nsaids Rash   Current Outpatient Prescriptions on  File Prior to Visit  Medication Sig Dispense Refill  . cyanocobalamin 2000 MCG tablet Take 1 tablet (2,000 mcg total) by mouth daily.  90 tablet  3  . desonide (DESOWEN) 0.05 % lotion Apply topically 2 (two) times daily.  59 mL  2  . EPINEPHrine (EPI-PEN) 0.3 mg/0.3 mL DEVI Inject 0.3 mg into the muscle daily as needed (Anaphylaxis).      . hydrOXYzine (ATARAX/VISTARIL) 50 MG tablet Take 1 tablet (50 mg total) by mouth 3 (three) times daily as needed.  65 tablet  1  . ranitidine (ZANTAC) 150 MG tablet Take 1 tablet (150 mg total) by mouth 2 (two) times daily.  60 tablet  0   No current facility-administered medications on file prior to visit.    Review of Systems  Constitutional: Negative for unusual diaphoresis or other sweats  HENT: Negative for ringing in ear Eyes: Negative for double vision or worsening visual disturbance.  Respiratory: Negative for choking and stridor.   Gastrointestinal: Negative for vomiting or other signifcant bowel change Genitourinary: Negative for hematuria or decreased urine volume.  Musculoskeletal: Negative for other MSK pain or swelling Skin: Negative for color change and worsening wound.  Neurological: Negative for tremors and numbness other than noted  Psychiatric/Behavioral: Negative for decreased concentration or agitation other than above       Objective:   Physical Exam BP 120/78  Pulse 65  Temp(Src) 98.3 F (36.8 C) (Oral)  Ht 5\' 7"  (1.702  m)  Wt 260 lb (117.935 kg)  BMI 40.71 kg/m2  SpO2 96% VS noted,  Constitutional: Pt appears well-developed, well-nourished.  HENT: Head: NCAT.  Right Ear: External ear normal.  Left Ear: External ear normal.  Bilat tm's with mild erythema.  Max sinus areas non tender.  Pharynx with mild erythema, no exudate Eyes: . Pupils are equal, round, and reactive to light. Conjunctivae and EOM are normal Neck: Normal range of motion. Neck supple.  Cardiovascular: Normal rate and regular rhythm.     Pulmonary/Chest: Effort normal and breath sounds normal.  Neurological: Pt is alert. Not confused , motor grossly intact Skin: Skin is warm. No rash Psychiatric: Pt behavior is normal. No agitation.     Assessment & Plan:

## 2013-07-30 NOTE — Patient Instructions (Signed)
You had the B12 shot today  Please make Nurse Visit appointment for monthly B12 shots  Please continue all other medications as before, and refills have been done if requested.  Please have the pharmacy call with any other refills you may need.  Please keep your appointments with your specialists as you may have planned  Please return in 6 months, or sooner if needed, with Lab testing done 3-5 days before

## 2013-08-01 NOTE — Assessment & Plan Note (Signed)
Improved with otc allegra, to cont prn, to f/u any worsening symptoms or concerns

## 2013-08-01 NOTE — Assessment & Plan Note (Signed)
stable overall by history and exam, recent data reviewed with pt, and pt to continue medical treatment as before,  to f/u any worsening symptoms or concerns SpO2 Readings from Last 3 Encounters:  07/30/13 96%  07/09/13 99%  06/11/13 98%

## 2013-08-01 NOTE — Assessment & Plan Note (Signed)
For b12 IM monthly, start today,  to f/u any worsening symptoms or concerns

## 2013-08-01 NOTE — Assessment & Plan Note (Signed)
stable overall by history and exam, recent data reviewed with pt, and pt to continue medical treatment as before,  to f/u any worsening symptoms or concerns Lab Results  Component Value Date   WBC 5.1 03/14/2013   HGB 11.6* 03/14/2013   HCT 35.6* 03/14/2013   MCV 85.5 03/14/2013   PLT 187.0 03/14/2013

## 2013-08-04 ENCOUNTER — Ambulatory Visit: Payer: No Typology Code available for payment source | Admitting: Family Medicine

## 2013-08-04 ENCOUNTER — Ambulatory Visit: Payer: No Typology Code available for payment source | Attending: Family Medicine | Admitting: Rehabilitation

## 2013-08-04 DIAGNOSIS — IMO0001 Reserved for inherently not codable concepts without codable children: Secondary | ICD-10-CM | POA: Insufficient documentation

## 2013-08-04 DIAGNOSIS — M25559 Pain in unspecified hip: Secondary | ICD-10-CM | POA: Insufficient documentation

## 2013-08-06 ENCOUNTER — Ambulatory Visit: Payer: No Typology Code available for payment source | Admitting: Rehabilitation

## 2013-08-06 DIAGNOSIS — IMO0001 Reserved for inherently not codable concepts without codable children: Secondary | ICD-10-CM | POA: Diagnosis not present

## 2013-08-11 ENCOUNTER — Ambulatory Visit: Payer: No Typology Code available for payment source | Admitting: Rehabilitation

## 2013-08-11 DIAGNOSIS — IMO0001 Reserved for inherently not codable concepts without codable children: Secondary | ICD-10-CM | POA: Diagnosis not present

## 2013-08-13 ENCOUNTER — Ambulatory Visit: Payer: No Typology Code available for payment source | Admitting: Rehabilitation

## 2013-08-13 DIAGNOSIS — IMO0001 Reserved for inherently not codable concepts without codable children: Secondary | ICD-10-CM | POA: Diagnosis not present

## 2013-08-18 ENCOUNTER — Ambulatory Visit (INDEPENDENT_AMBULATORY_CARE_PROVIDER_SITE_OTHER): Payer: No Typology Code available for payment source | Admitting: Family Medicine

## 2013-08-18 ENCOUNTER — Encounter: Payer: Self-pay | Admitting: Family Medicine

## 2013-08-18 ENCOUNTER — Encounter: Payer: No Typology Code available for payment source | Admitting: Rehabilitation

## 2013-08-18 VITALS — BP 112/74 | HR 47 | Ht 67.0 in | Wt 252.0 lb

## 2013-08-18 DIAGNOSIS — F4321 Adjustment disorder with depressed mood: Secondary | ICD-10-CM | POA: Insufficient documentation

## 2013-08-18 DIAGNOSIS — E669 Obesity, unspecified: Secondary | ICD-10-CM

## 2013-08-18 DIAGNOSIS — M25559 Pain in unspecified hip: Secondary | ICD-10-CM

## 2013-08-18 DIAGNOSIS — M25551 Pain in right hip: Secondary | ICD-10-CM

## 2013-08-18 MED ORDER — HYDROXYZINE HCL 25 MG PO TABS: 25.0000 mg | ORAL_TABLET | Freq: Three times a day (TID) | ORAL | Status: DC | PRN

## 2013-08-18 NOTE — Assessment & Plan Note (Signed)
Has lost 20 pounds over the last month with diet alone. Encourage her to continue.

## 2013-08-18 NOTE — Assessment & Plan Note (Signed)
Patient's the pain is improving after the injection. There is a concern for patient having a potential labral tear. I do not feel that further imaging or treatment is necessary at this time. Patient will continue with physical therapy when she feels confident that she can participate fully. We discussed patient also doing home exercises as well as the icing. Patient will continue this and follow up again with me in 3 weeks. If she continues to have pain we may need to consider further imaging such as an MRI. Or an MR arthrogram.

## 2013-08-18 NOTE — Progress Notes (Signed)
  Tawana ScaleZach  D.O. [facility] [address] [place], [place] [address] Phone: [phone] Subjective:     CC:  Right hip pain followup  BJY:[id number]:Subjective Hannah Thompson is a 43 y.o. female coming in with complaint of right hip pain. Patient seen previously and did have a small effusion of the hip joint itself. Patient was given an intra-articular injection. Patient had been doing home exercises it was doing considerably better at about 60-80% better. Unfortunately patient's younger brother died recently and she has not been doing exercises. States that the pain does come back a little bit but still significantly better than higher to the injection. Denies any radiation down the leg or any numbness or any nighttime awakening. Patient has not taken any medication for this pain. Patient is found it difficult to go to physical therapy at this time secondary to not finding motivation.    Past medical history, social, surgical and family history all reviewed in electronic medical record.   Review of Systems: No headache, visual changes, nausea, vomiting, diarrhea, constipation, dizziness, abdominal pain, skin rash, fevers, chills, night sweats, weight loss, swollen lymph nodes, body aches, joint swelling, muscle aches, chest pain, shortness of breath, mood changes.   Objective Blood pressure 112/74, pulse 47, height 5\' 7"  (1.702 m), weight 252 lb (114.306 kg), SpO2 99.00%.  General: No apparent distress alert and oriented x3 mood and affect normal, dressed appropriately. Obese, Tearful HEENT: Pupils equal, extraocular movements intact  Respiratory: Patient's speak in full sentences and does not appear short of breath  Cardiovascular: No lower extremity edema, non tender, no erythema  Skin: Warm dry intact with no signs of infection or rash on extremities or on axial skeleton.  Abdomen: Soft nontender  Neuro: Cranial nerves II through XII are intact, neurovascularly intact in all  extremities with 2+ DTRs and 2+ pulses.  Lymph: No lymphadenopathy of posterior or anterior cervical chain or axillae bilaterally.  Gait normal with good balance and coordination.  MSK:  Non tender with full range of motion and good stability and symmetric strength and tone of shoulders, elbows, wrist,  knee and ankles bilaterally.  Hip: Right ROM IR: 25 Deg, ER: 45 Deg, Flexion: 120 Deg, Extension: 100 Deg, Abduction: 35 Deg, Adduction: 45 Deg Strength IR: 5/5, ER: 5/5, Flexion: 3/5, Extension: 5/5, Abduction: 4/5, Adduction: 4/5 Pelvic alignment unremarkable to inspection and palpation. Standing hip rotation and gait without trendelenburg sign / unsteadiness. Greater trochanter without tenderness to palpation. No tenderness over piriformis and greater trochanter. Negative FADIR today.  No SI joint tenderness and normal minimal SI movement. Contralateral hip unremarkable     Impression and Recommendations:     This case required medical decision making of moderate complexity.   Wt Readings from Last 3 Encounters:  08/18/13 252 lb (114.306 kg)  07/30/13 260 lb (117.935 kg)  07/09/13 271 lb (122.925 kg)

## 2013-08-18 NOTE — Patient Instructions (Addendum)
You are doing great  Keep up the weight loss! I am sorry about your brother.  Hydroxyzine up to 3 times daily. It will help with the anxiety. Ice is your friend Continue the exercises for your hip and physical therapy.  Come back in 3 weeks and we will see how you are doing.

## 2013-08-18 NOTE — Assessment & Plan Note (Signed)
Patient is grieving. Discussed with patient at great length. Patient was given some hydroxyzine for some anxiety as well as helping with her sleep. Patient denies any suicidal or homicidal ideation. Patient does have good support system. Patient will follow up in 3 weeks and we'll discuss further. Discuss the patient following up with primary care provider to be a good idea as well.

## 2013-08-20 ENCOUNTER — Encounter: Payer: No Typology Code available for payment source | Admitting: Rehabilitation

## 2013-09-04 ENCOUNTER — Encounter (HOSPITAL_COMMUNITY): Payer: Self-pay | Admitting: Emergency Medicine

## 2013-09-04 ENCOUNTER — Emergency Department (HOSPITAL_COMMUNITY): Payer: No Typology Code available for payment source

## 2013-09-04 ENCOUNTER — Inpatient Hospital Stay (HOSPITAL_COMMUNITY)
Admission: EM | Admit: 2013-09-04 | Discharge: 2013-09-08 | DRG: 176 | Disposition: A | Discharge status: Home / Self Care | Payer: No Typology Code available for payment source | Attending: Internal Medicine | Admitting: Internal Medicine

## 2013-09-04 DIAGNOSIS — Z8249 Family history of ischemic heart disease and other diseases of the circulatory system: Secondary | ICD-10-CM | POA: Diagnosis not present

## 2013-09-04 DIAGNOSIS — Z87891 Personal history of nicotine dependence: Secondary | ICD-10-CM | POA: Diagnosis not present

## 2013-09-04 DIAGNOSIS — I824Z9 Acute embolism and thrombosis of unspecified deep veins of unspecified distal lower extremity: Secondary | ICD-10-CM | POA: Diagnosis present

## 2013-09-04 DIAGNOSIS — D649 Anemia, unspecified: Secondary | ICD-10-CM | POA: Diagnosis present

## 2013-09-04 DIAGNOSIS — Z6839 Body mass index (BMI) 39.0-39.9, adult: Secondary | ICD-10-CM

## 2013-09-04 DIAGNOSIS — R55 Syncope and collapse: Secondary | ICD-10-CM

## 2013-09-04 DIAGNOSIS — Z8041 Family history of malignant neoplasm of ovary: Secondary | ICD-10-CM

## 2013-09-04 DIAGNOSIS — Z975 Presence of (intrauterine) contraceptive device: Secondary | ICD-10-CM | POA: Diagnosis not present

## 2013-09-04 DIAGNOSIS — I498 Other specified cardiac arrhythmias: Secondary | ICD-10-CM | POA: Diagnosis present

## 2013-09-04 DIAGNOSIS — Z801 Family history of malignant neoplasm of trachea, bronchus and lung: Secondary | ICD-10-CM | POA: Diagnosis not present

## 2013-09-04 DIAGNOSIS — Z833 Family history of diabetes mellitus: Secondary | ICD-10-CM | POA: Diagnosis not present

## 2013-09-04 DIAGNOSIS — E785 Hyperlipidemia, unspecified: Secondary | ICD-10-CM | POA: Diagnosis present

## 2013-09-04 DIAGNOSIS — D696 Thrombocytopenia, unspecified: Secondary | ICD-10-CM | POA: Diagnosis present

## 2013-09-04 DIAGNOSIS — E538 Deficiency of other specified B group vitamins: Secondary | ICD-10-CM | POA: Diagnosis present

## 2013-09-04 DIAGNOSIS — I2699 Other pulmonary embolism without acute cor pulmonale: Secondary | ICD-10-CM | POA: Diagnosis not present

## 2013-09-04 DIAGNOSIS — I824Y9 Acute embolism and thrombosis of unspecified deep veins of unspecified proximal lower extremity: Secondary | ICD-10-CM | POA: Diagnosis present

## 2013-09-04 DIAGNOSIS — I82491 Acute embolism and thrombosis of other specified deep vein of right lower extremity: Secondary | ICD-10-CM

## 2013-09-04 DIAGNOSIS — E669 Obesity, unspecified: Secondary | ICD-10-CM | POA: Diagnosis present

## 2013-09-04 DIAGNOSIS — R0781 Pleurodynia: Secondary | ICD-10-CM

## 2013-09-04 DIAGNOSIS — R51 Headache: Secondary | ICD-10-CM | POA: Diagnosis present

## 2013-09-04 DIAGNOSIS — J45909 Unspecified asthma, uncomplicated: Secondary | ICD-10-CM | POA: Diagnosis present

## 2013-09-04 DIAGNOSIS — M7989 Other specified soft tissue disorders: Secondary | ICD-10-CM

## 2013-09-04 DIAGNOSIS — I82409 Acute embolism and thrombosis of unspecified deep veins of unspecified lower extremity: Secondary | ICD-10-CM

## 2013-09-04 DIAGNOSIS — R519 Headache, unspecified: Secondary | ICD-10-CM | POA: Diagnosis present

## 2013-09-04 LAB — CBC
HCT: 34.7 % — ABNORMAL LOW (ref 36.0–46.0)
Hemoglobin: 11.2 g/dL — ABNORMAL LOW (ref 12.0–15.0)
MCH: 27.5 pg (ref 26.0–34.0)
MCHC: 32.3 g/dL (ref 30.0–36.0)
MCV: 85.3 fL (ref 78.0–100.0)
PLATELETS: 116 10*3/uL — AB (ref 150–400)
RBC: 4.07 MIL/uL (ref 3.87–5.11)
RDW: 14.8 % (ref 11.5–15.5)
WBC: 4.4 10*3/uL (ref 4.0–10.5)

## 2013-09-04 LAB — COMPREHENSIVE METABOLIC PANEL
ALT: 41 U/L — ABNORMAL HIGH (ref 0–35)
AST: 47 U/L — ABNORMAL HIGH (ref 0–37)
Albumin: 3.7 g/dL (ref 3.5–5.2)
Alkaline Phosphatase: 55 U/L (ref 39–117)
Anion gap: 12 (ref 5–15)
BUN: 13 mg/dL (ref 6–23)
CO2: 25 meq/L (ref 19–32)
CREATININE: 0.65 mg/dL (ref 0.50–1.10)
Calcium: 9.3 mg/dL (ref 8.4–10.5)
Chloride: 104 mEq/L (ref 96–112)
GFR calc Af Amer: >90 mL/min (ref >90)
Glucose, Bld: 103 mg/dL — ABNORMAL HIGH (ref 70–99)
Potassium: 4 mEq/L (ref 3.7–5.3)
Sodium: 141 mEq/L (ref 137–147)
Total Bilirubin: 0.3 mg/dL (ref 0.3–1.2)
Total Protein: 7 g/dL (ref 6.0–8.3)

## 2013-09-04 LAB — ANTITHROMBIN III: ANTITHROMB III FUNC: 83 % (ref 75–120)

## 2013-09-04 LAB — URINALYSIS, ROUTINE W REFLEX MICROSCOPIC
Bilirubin Urine: NEGATIVE
Glucose, UA: NEGATIVE mg/dL
Hgb urine dipstick: NEGATIVE
KETONES UR: NEGATIVE mg/dL
LEUKOCYTES UA: NEGATIVE
NITRITE: NEGATIVE
PH: 7 (ref 5.0–8.0)
Protein, ur: NEGATIVE mg/dL
Specific Gravity, Urine: 1.007 (ref 1.005–1.030)
Urobilinogen, UA: 0.2 mg/dL (ref 0.0–1.0)

## 2013-09-04 LAB — PROTIME-INR
INR: 1.07 (ref 0.00–1.49)
Prothrombin Time: 13.9 seconds (ref 11.6–15.2)

## 2013-09-04 LAB — HEPARIN LEVEL (UNFRACTIONATED): Heparin Unfractionated: 0.55 IU/mL (ref 0.30–0.70)

## 2013-09-04 LAB — HOMOCYSTEINE: Homocysteine: 8.5 umol/L (ref 4.0–15.4)

## 2013-09-04 LAB — MRSA PCR SCREENING: MRSA by PCR: NEGATIVE

## 2013-09-04 LAB — D-DIMER, QUANTITATIVE (NOT AT ARMC)

## 2013-09-04 LAB — TROPONIN I

## 2013-09-04 LAB — APTT: aPTT: 35 seconds (ref 24–37)

## 2013-09-04 MED ORDER — HEPARIN BOLUS VIA INFUSION
2500.0000 [IU] | Freq: Once | INTRAVENOUS | Status: AC
Start: 2013-09-04 — End: 2013-09-04
  Administered 2013-09-04: 2500 [IU] via INTRAVENOUS
  Filled 2013-09-04: qty 2500

## 2013-09-04 MED ORDER — TRAMADOL HCL 50 MG PO TABS
50.0000 mg | ORAL_TABLET | Freq: Once | ORAL | Status: AC
  Administered 2013-09-04: 50 mg via ORAL
  Filled 2013-09-04: qty 1

## 2013-09-04 MED ORDER — MIDAZOLAM HCL 2 MG/2ML IJ SOLN: 2.0000 mg | Freq: Once | INTRAMUSCULAR | Status: DC

## 2013-09-04 MED ORDER — FAMOTIDINE 20 MG PO TABS
20.0000 mg | ORAL_TABLET | Freq: Two times a day (BID) | ORAL | Status: DC
  Administered 2013-09-04 – 2013-09-08 (×8): 20 mg via ORAL
  Filled 2013-09-04 (×9): qty 1

## 2013-09-04 MED ORDER — ONDANSETRON HCL 4 MG/2ML IJ SOLN: 4.0000 mg | Freq: Four times a day (QID) | INTRAMUSCULAR | Status: DC | PRN

## 2013-09-04 MED ORDER — ONDANSETRON HCL 4 MG PO TABS: 4.0000 mg | ORAL_TABLET | Freq: Four times a day (QID) | ORAL | Status: DC | PRN

## 2013-09-04 MED ORDER — HYDROXYZINE HCL 25 MG PO TABS
25.0000 mg | ORAL_TABLET | Freq: Three times a day (TID) | ORAL | Status: DC | PRN
  Filled 2013-09-04: qty 1

## 2013-09-04 MED ORDER — SODIUM CHLORIDE 0.9 % IV SOLN
INTRAVENOUS | Status: DC
  Administered 2013-09-04 – 2013-09-06 (×7): via INTRAVENOUS

## 2013-09-04 MED ORDER — SODIUM CHLORIDE 0.9 % IV BOLUS (SEPSIS)
1000.0000 mL | Freq: Once | INTRAVENOUS | Status: AC
  Administered 2013-09-04: 1000 mL via INTRAVENOUS

## 2013-09-04 MED ORDER — HEPARIN (PORCINE) IN NACL 100-0.45 UNIT/ML-% IJ SOLN
1500.0000 [IU]/h | INTRAMUSCULAR | Status: DC
  Administered 2013-09-04 – 2013-09-06 (×3): 1500 [IU]/h via INTRAVENOUS
  Filled 2013-09-04 (×4): qty 250

## 2013-09-04 MED ORDER — ACETAMINOPHEN 650 MG RE SUPP: 650.0000 mg | Freq: Four times a day (QID) | RECTAL | Status: DC | PRN

## 2013-09-04 MED ORDER — ACETAMINOPHEN 325 MG PO TABS
650.0000 mg | ORAL_TABLET | Freq: Four times a day (QID) | ORAL | Status: DC | PRN
  Administered 2013-09-05 – 2013-09-07 (×3): 650 mg via ORAL
  Filled 2013-09-04 (×3): qty 2

## 2013-09-04 MED ORDER — SODIUM CHLORIDE 0.9 % IJ SOLN
3.0000 mL | Freq: Two times a day (BID) | INTRAMUSCULAR | Status: DC
  Administered 2013-09-04 – 2013-09-08 (×4): 3 mL via INTRAVENOUS

## 2013-09-04 MED ORDER — VITAMIN B-12 1000 MCG PO TABS
2000.0000 ug | ORAL_TABLET | Freq: Every day | ORAL | Status: DC
  Administered 2013-09-04 – 2013-09-08 (×5): 2000 ug via ORAL
  Filled 2013-09-04 (×6): qty 2

## 2013-09-04 MED ORDER — IOHEXOL 350 MG/ML SOLN
100.0000 mL | Freq: Once | INTRAVENOUS | Status: AC | PRN
  Administered 2013-09-04: 100 mL via INTRAVENOUS

## 2013-09-04 MED ORDER — ALBUTEROL SULFATE (2.5 MG/3ML) 0.083% IN NEBU
2.5000 mg | INHALATION_SOLUTION | RESPIRATORY_TRACT | Status: DC | PRN
Start: 2013-09-04 — End: 2013-09-08

## 2013-09-04 NOTE — ED Notes (Signed)
All belongings with patient. EKG and labels sent with patient to ICU.

## 2013-09-04 NOTE — ED Provider Notes (Signed)
CSN: [id number]     Arrival date & time 09/04/13  0906 History   First MD Initiated Contact with Patient 09/04/13 0915     Chief Complaint  Patient presents with  . Loss of Consciousness     (Consider location/radiation/quality/duration/timing/severity/associated sxs/prior Treatment) HPI Comments: Pt with no sig med hx comes in with cc of LOC. Pt states that she woke up this AM, went to the toilet, and the next thing she recalls is being slumped on the bath tub next to her. She has no chest pain, dib, palpitations. Pt has had no chest pain or dib leading upto today, but this AM, when going to the bathroom she felt like she had a dib. Pt has no hx of CAD, syncope, and she did have a brother who passed away in his 30s due to unexplained reasons just last month.  Patient is a 43 y.o. female presenting with syncope. The history is provided by the patient.  Loss of Consciousness Associated symptoms: no chest pain, no confusion, no nausea, no seizures, no shortness of breath and no vomiting     Past Medical History  Diagnosis Date  . Headache(784.0) 06/21/2009    recurrent  . VITAMIN B12 DEFICIENCY 03/18/2009  . ASTHMA 03/17/2009  . ALLERGIC RHINITIS 03/17/2009  . ECZEMA 03/17/2009  . DISC DISEASE, LUMBAR 03/17/2009    mild facet arthropathy by MRI 2005  . HYPERLIPIDEMIA 03/17/2009  . ANEMIA-NOS 03/17/2009  . KELOID 06/21/2009   Past Surgical History  Procedure Laterality Date  . Keloid excision  mid 1990's    s/p keloid removal-laser    Family History  Problem Relation Age of Onset  . Cancer Father     Prostate Cancer  . Hypertension Father   . Allergies Sister   . Cancer Paternal Aunt     Ovarian Cancer  . Cancer Other     Grandparent-Lung Cancer  . Diabetes Other     Grandparent  . Heart disease Other     Grandparent  . Alcohol abuse Other     Several on both sides of family-3 uncles  . Hypertension Mother   . Diabetes Mother    History  Substance Use Topics  . Smoking  status: Former Games developer  . Smokeless tobacco: Never Used  . Alcohol Use: No     Comment: socially   OB History   Grav Para Term Preterm Abortions TAB SAB Ect Mult Living   Review of Systems  Constitutional: Negative for activity change.  HENT: Negative for facial swelling.   Respiratory: Negative for cough, shortness of breath and wheezing.   Cardiovascular: Positive for syncope. Negative for chest pain.  Gastrointestinal: Negative for nausea, vomiting, abdominal pain, diarrhea, constipation, blood in stool and abdominal distention.  Genitourinary: Negative for hematuria and difficulty urinating.  Musculoskeletal: Negative for neck pain.  Skin: Negative for color change.  Neurological: Positive for syncope. Negative for seizures and speech difficulty.  Hematological: Does not bruise/bleed easily.  Psychiatric/Behavioral: Negative for confusion.      Allergies  Oxaprozin; Sulfonamide derivatives; and Nsaids  Home Medications   Prior to Admission medications   Medication Sig Start Date End Date Taking? Authorizing Provider  cyanocobalamin 2000 MCG tablet Take 2,000 mcg by mouth daily.   Yes Historical Provider, MD  EPINEPHrine (EPI-PEN) 0.3 mg/0.3 mL DEVI Inject 0.3 mg into the muscle daily as needed (Anaphylaxis). 06/23/10  Yes Corwin Levins, MD  hydrOXYzine (ATARAX/VISTARIL) 25 MG tablet Take 25 mg by mouth 3 (three) times daily as needed for anxiety or itching.   Yes Historical Provider, MD  ranitidine (ZANTAC) 150 MG tablet Take 150 mg by mouth 2 (two) times daily.   Yes Historical Provider, MD   BP 128/81  Pulse 54  Temp(Src) 97.6 F (36.4 C)  Resp 17  SpO2 99% Physical Exam  Nursing note and vitals reviewed. Constitutional: She is oriented to person, place, and time. She appears well-developed and well-nourished.  HENT:  Head: Normocephalic and atraumatic.  Eyes: EOM are normal. Pupils are equal, round, and reactive to light.  Neck: Neck supple.  No JVD present.  Cardiovascular: Normal rate, regular rhythm and normal heart sounds.   No murmur heard. Pulmonary/Chest: Effort normal. No respiratory distress.  Abdominal: Soft. She exhibits no distension. There is no tenderness. There is no rebound and no guarding.  Musculoskeletal: She exhibits tenderness.  RLE swelling  Neurological: She is alert and oriented to person, place, and time.  Skin: Skin is warm and dry.    ED Course  Procedures (including critical care time) Labs Review Labs Reviewed  CBC - Abnormal; Notable for the following:    Hemoglobin 11.2 (*)    HCT 34.7 (*)    Platelets 116 (*)    All other components within normal limits  COMPREHENSIVE METABOLIC PANEL - Abnormal; Notable for the following:    Glucose, Bld 103 (*)    AST 47 (*)    ALT 41 (*)    All other components within normal limits  D-DIMER, QUANTITATIVE - Abnormal; Notable for the following:    D-Dimer, Quant >20.00 (*)    All other components within normal limits  TROPONIN I  URINALYSIS, ROUTINE W REFLEX MICROSCOPIC  APTT  PROTIME-INR  ANTITHROMBIN III  PROTEIN C ACTIVITY  PROTEIN C, TOTAL  PROTEIN S ACTIVITY  PROTEIN S, TOTAL  LUPUS ANTICOAGULANT PANEL  BETA-2-GLYCOPROTEIN I ABS, IGG/M/A  HOMOCYSTEINE  FACTOR 5 LEIDEN  PROTHROMBIN GENE MUTATION  CARDIOLIPIN ANTIBODIES, IGG, IGM, IGA    Imaging Review Ct Angio Chest Pe W/cm &/or Wo Cm  09/04/2013   CLINICAL DATA:  Syncopal episode with chest pain and known history of deep venous thrombosis.  EXAM: CT ANGIOGRAPHY CHEST WITH CONTRAST  TECHNIQUE: Multidetector CT imaging of the chest was performed using the standard protocol during bolus administration of intravenous contrast. Multiplanar CT image reconstructions and MIPs were obtained to evaluate the vascular anatomy.  CONTRAST:  OMNIPAQUE IOHEXOL 350 MG/ML SOLN  COMPARISON:  None.  FINDINGS: The lungs are well-aerated without focal infiltrate or sizable effusion. No pneumothorax or  parenchymal nodule is seen. No significant hilar or mediastinal adenopathy is noted.  The pulmonary artery shows significant bilateral pulmonary emboli predominately on the right and to a lesser degree on the left. The RV the LV ratios 1.1 for which is consistent with sub massive pulmonary embolism and right heart strain. The thoracic aorta is within normal limits. No other focal vascular abnormality is seen.  Scanning into the upper abdomen reveals no acute abnormality. The osseous structures are within normal limits.  Review of the MIP images confirms the above findings.  IMPRESSION: Positive for acute PE with CT evidence of right heart strain (RV/LV Ratio = 1.14) consistent with at least submassive (intermediate risk)PE. The presence of right heart strain has been associated with an increased risk of morbidity and mortality. Consultation with Pulmonary and Critical Care Medicine is recommended.  No other focal  abnormality is noted.  Critical Value/emergent results were called by telephone at the time of interpretation on 09/04/2013 at 2:29 pm to Dr. Derwood Kaplan , who verbally acknowledged these results.   Electronically Signed   By: Alcide Clever M.D.   On: 09/04/2013 14:29     EKG Interpretation   Date/Time:  Thursday September 04 2013 09:35:33 EDT Ventricular Rate:  69 PR Interval:  158 QRS Duration: 95 QT Interval:  441 QTC Calculation: 472 R Axis:   11 Text Interpretation:  Sinus rhythm Low voltage, precordial leads Probable  left ventricular hypertrophy Nonspecific T abnormalities, anterior leads  No comparison available Confirmed by Rhunette Croft, MD, Janey Genta [phone]) on  09/04/2013 10:55:40 AM      MDM   Final diagnoses:  Syncope, unspecified syncope type  Pulmonary embolism  DVT (deep venous thrombosis), unspecified laterality    CRITICAL CARE Performed by: Derwood Kaplan   Total critical care time: 50 minutes - submassive PE, with syncope  Critical care time was exclusive of  separately billable procedures and treating other patients.  Critical care was necessary to treat or prevent imminent or life-threatening deterioration.  Critical care was time spent personally by me on the following activities: development of treatment plan with patient and/or surrogate as well as nursing, discussions with consultants, evaluation of patient's response to treatment, examination of patient, obtaining history from patient or surrogate, ordering and performing treatments and interventions, ordering and review of laboratory studies, ordering and review of radiographic studies, pulse oximetry and re-evaluation of patient's condition.   Pt comes in with cc of LOC. Pt has WELLS score of 0 and is PERC negative. She has no CAD hx. Current ekg does show s1,q3 with t wave flattening in lead 3. Otherwise no acute findings, and the intervals are normal. Pt has fam hx of unexpected deaths. In fact, i was the physician who tried to resuscitate her brother last month when he arrived post arrest. Plan was to admit patient for syncope, irrespectively. However, given the RLE swelling and hx of sudden unexpected death - PE was still on the ddx, despite the her apparent hemodynamic, and cardiopulm stability. Dimer is elevated. Pt has a DVT and submassive PE.  Heparin ordered. Hospitalist wants CT head prior to starting the heparin, as she has a headache with a fall. Pt informed.   Derwood Kaplan, MD 09/04/13 1558

## 2013-09-04 NOTE — Progress Notes (Addendum)
ANTICOAGULATION CONSULT NOTE - Initial Consult  Pharmacy Consult for:  IV heparin Indication:  RLE DVT and acute pulmonary embolism   Allergies  Allergen Reactions  . Oxaprozin     angioedema  . Sulfonamide Derivatives     REACTION: hives  . Nsaids Rash    Patient Measurements: 08/18/13 - Height 170 cm  Weight 114.3 kg Heparin Dosing Weight: 88kg  Vital Signs: Temp: 97.6 F (36.4 C) (09/03 0931) BP: 111/73 mmHg (09/03 1610) Pulse Rate: 56 (09/03 1610)  Labs:  Recent Labs  09/04/13 1017 09/04/13 1441  HGB 11.2*  --   HCT 34.7*  --   PLT 116*  --   APTT  --  35  LABPROT  --  13.9  INR  --  1.07  CREATININE 0.65  --   TROPONINI <0.30  --     Medical History: Past Medical History  Diagnosis Date  . Headache(784.0) 06/21/2009    recurrent  . VITAMIN B12 DEFICIENCY 03/18/2009  . ASTHMA 03/17/2009  . ALLERGIC RHINITIS 03/17/2009  . ECZEMA 03/17/2009  . DISC DISEASE, LUMBAR 03/17/2009    mild facet arthropathy by MRI 2005  . HYPERLIPIDEMIA 03/17/2009  . ANEMIA-NOS 03/17/2009  . KELOID 06/21/2009    Medications:  Pending  Assessment:  Asked to assist with IV heparin therapy for this 43 year-old female with extensive RLE DVT and acute submassive pulmonary embolism with right heart strain.  Baseline PT/INR and aPTT are within normal limits.    Factors which may increase the risk for bleeding:  thrombocytopenia  CT head report states no evidence of acute abnormality, including hemorrhage.  Goals of Therapy:   Heparin level 0.3-0.7 units/ml  Monitor platelets by anticoagulation protocol: Yes   Plan:   Heparin 2500 units as bolus dose, then infusion at 1500 units/hr.  Heparin level 6 hours after infusion started  Heparin level and CBC daily  [employer] R.Ph. 09/04/2013 4:39 PM

## 2013-09-04 NOTE — ED Notes (Signed)
MD at bedside to explain plan of care.

## 2013-09-04 NOTE — H&P (Addendum)
History and Physical  Hannah Thompson:[id number] DOB: 1970-09-21 DOA: 09/04/2013  Referring physician: Dr. Derwood Kaplan, EDP PCP: Hannah Barre, MD  Outpatient Specialists:  1. None  Chief Complaint: Passed out and dyspnea.  HPI: Hannah Thompson is a 43 y.o. female with history of vitamin B 12 deficiency, HLD, anemia, recent demise of a young brother in the ED from cardiac arrest of unknown etiology (referred to medical examiner), no prior history of VTE, presented to the ED with an episode of passing out today. She was in her usual state of health until 5 AM on 9/3 when she got up and was climbing stairs to use the bathroom upstairs. She experienced dyspnea on exertion, palpitations, dizziness and lightheadedness which were new to her. She then went to the toilet and sat down on the toilet and suddenly passed out. When she woke up, she found herself laying in the bathtub which is next to the toilet and she had urinated on the floor. She cannot recollect if she hit her head but has been having headache ever since her brother died 2 weeks ago. She describes the headache as frontal, mild to moderate and intermittent without radiation and no associated nausea, vomiting or visual disturbances. She felt slightly confused initially but got up and subsequently felt better. She got her kids ready for school and drop them to school and then presented to the ED. Fortunately she saw the same EDP who had seen her brother. He was concerned regarding her presentation and noticed asymmetric right lower extremity on exam. She denied noticing leg swelling or pain. She denies recent long-distance travel. She is not on oral contraceptives and does have an IUD. Further workup in the ED revealed extensive right lower extremity DVT and CT angiogram of the chest shows some massive pulmonary embolism with evidence of right heart strain. Hospitalist admission was requested.  Review of Systems: All systems reviewed and apart  from history of presenting illness, are negative.  Past Medical History  Diagnosis Date  . Headache(784.0) 06/21/2009    recurrent  . VITAMIN B12 DEFICIENCY 03/18/2009  . ASTHMA 03/17/2009  . ALLERGIC RHINITIS 03/17/2009  . ECZEMA 03/17/2009  . DISC DISEASE, LUMBAR 03/17/2009    mild facet arthropathy by MRI 2005  . HYPERLIPIDEMIA 03/17/2009  . ANEMIA-NOS 03/17/2009  . KELOID 06/21/2009   Past Surgical History  Procedure Laterality Date  . Keloid excision  mid 1990's    s/p keloid removal-laser    Social History:  reports that she has quit smoking. She has never used smokeless tobacco. She reports that she does not drink alcohol or use illicit drugs. Single. Lives with her 3 kids and is independent of activities of daily living.  Allergies  Allergen Reactions  . Oxaprozin     angioedema  . Sulfonamide Derivatives     REACTION: hives  . Nsaids Rash    Family History  Problem Relation Age of Onset  . Cancer Father     Prostate Cancer  . Hypertension Father   . Allergies Sister   . Cancer Paternal Aunt     Ovarian Cancer  . Cancer Other     Grandparent-Lung Cancer  . Diabetes Other     Grandparent  . Heart disease Other     Grandparent  . Alcohol abuse Other     Several on both sides of family-3 uncles  . Hypertension Mother   . Diabetes Mother     Prior to Admission medications  Medication Sig Start Date End Date Taking? Authorizing Provider  cyanocobalamin 2000 MCG tablet Take 2,000 mcg by mouth daily.   Yes Historical Provider, MD  EPINEPHrine (EPI-PEN) 0.3 mg/0.3 mL DEVI Inject 0.3 mg into the muscle daily as needed (Anaphylaxis). 06/23/10  Yes Corwin Levins, MD  hydrOXYzine (ATARAX/VISTARIL) 25 MG tablet Take 25 mg by mouth 3 (three) times daily as needed for anxiety or itching.   Yes Historical Provider, MD  ranitidine (ZANTAC) 150 MG tablet Take 150 mg by mouth 2 (two) times daily.   Yes Historical Provider, MD   Physical Exam: Filed Vitals:   09/04/13 1130  09/04/13 1200 09/04/13 1230 09/04/13 1300  BP: 152/138 124/76 126/78 128/81  Pulse: 66 66 67 54  Temp:      Resp: SpO2: 98% 96% 97% 99%   temperature 97.53F.   General exam: Moderately built and morbidly obese female patient, lying comfortably supine on the gurney in no obvious distress.  Head, eyes and ENT: Nontraumatic and normocephalic. Pupils equally reacting to light and accommodation. Oral mucosa moist.  Neck: Supple. No JVD, carotid bruit or thyromegaly.  Lymphatics: No lymphadenopathy.  Respiratory system: Clear to auscultation. No increased work of breathing.  Cardiovascular system: S1 and S2 heard, RRR. No JVD, murmurs, gallops, clicks or pedal edema.  Gastrointestinal system: Abdomen is nondistended, soft and nontender. Normal bowel sounds heard. No organomegaly or masses appreciated.  Central nervous system: Alert and oriented. No focal neurological deficits.  Extremities: Symmetric 5 x 5 power. Peripheral pulses symmetrically felt. Right lower extremity with diffuse mild and asymmetric swelling compared to left without any other acute findings.  Skin: No rashes or acute findings.  Musculoskeletal system: Negative exam.  Psychiatry: Pleasant and cooperative.   Labs on Admission:  Basic Metabolic Panel:  Recent Labs Lab 09/04/13 1017  NA 141  K 4.0  CL 104  CO2 25  GLUCOSE 103*  BUN 13  CREATININE 0.65  CALCIUM 9.3   Liver Function Tests:  Recent Labs Lab 09/04/13 1017  AST 47*  ALT 41*  ALKPHOS 55  BILITOT 0.3  PROT 7.0  ALBUMIN 3.7   No results found for this basename: LIPASE, AMYLASE,  in the last 168 hours No results found for this basename: AMMONIA,  in the last 168 hours CBC:  Recent Labs Lab 09/04/13 1017  WBC 4.4  HGB 11.2*  HCT 34.7*  MCV 85.3  PLT 116*   Cardiac Enzymes:  Recent Labs Lab 09/04/13 1017  TROPONINI <0.30    BNP (last 3 results) No results found for this basename: PROBNP,  in the last  8760 hours CBG: No results found for this basename: GLUCAP,  in the last 168 hours  Radiological Exams on Admission: Ct Angio Chest Pe W/cm &/or Wo Cm  09/04/2013   CLINICAL DATA:  Syncopal episode with chest pain and known history of deep venous thrombosis.  EXAM: CT ANGIOGRAPHY CHEST WITH CONTRAST  TECHNIQUE: Multidetector CT imaging of the chest was performed using the standard protocol during bolus administration of intravenous contrast. Multiplanar CT image reconstructions and MIPs were obtained to evaluate the vascular anatomy.  CONTRAST:  OMNIPAQUE IOHEXOL 350 MG/ML SOLN  COMPARISON:  None.  FINDINGS: The lungs are well-aerated without focal infiltrate or sizable effusion. No pneumothorax or parenchymal nodule is seen. No significant hilar or mediastinal adenopathy is noted.  The pulmonary artery shows significant bilateral pulmonary emboli predominately on the right and to a lesser degree on  the left. The RV the LV ratios 1.1 for which is consistent with sub massive pulmonary embolism and right heart strain. The thoracic aorta is within normal limits. No other focal vascular abnormality is seen.  Scanning into the upper abdomen reveals no acute abnormality. The osseous structures are within normal limits.  Review of the MIP images confirms the above findings.  IMPRESSION: Positive for acute PE with CT evidence of right heart strain (RV/LV Ratio = 1.14) consistent with at least submassive (intermediate risk)PE. The presence of right heart strain has been associated with an increased risk of morbidity and mortality. Consultation with Pulmonary and Critical Care Medicine is recommended.  No other focal abnormality is noted.  Critical Value/emergent results were called by telephone at the time of interpretation on 09/04/2013 at 2:29 pm to Dr. Derwood Kaplan , who verbally acknowledged these results.   Electronically Signed   By: Alcide Clever M.D.   On: 09/04/2013 14:29   EKG: Independently reviewed.  Normal sinus rhythm, normal axis, nonspecific T wave abnormalities (S1, Q3) & no acute findings.   Assessment/Plan Principal Problem:   Acute pulmonary embolism-submassive with right heart strain Active Problems:   HYPERLIPIDEMIA   ANEMIA-NOS   Headache(784.0)   Obesity   Acute deep vein thrombosis (DVT) of other specified vein of right lower extremity   Thrombocytopenia   Syncope and collapse   1. Acute submassive pulmonary embolism with right heart strain/extensive right lower extremity DVT: Hemodynamically stable. Admit to step down unit for close monitoring and management. Check 2-D echo and hypercoagulable panel. Will start on IV heparin per pharmacy (pending negative head CT) and will not start oral anticoagulants until we ensure that she remains stable and pending 2-D echo results. IV fluid hydration. Bedrest. Discussed with PCCM MD Dr. Stephanie Acre who agrees with above and wishes to be consulted if she declines in any way. Will need hematology consultation and followup. 2. Syncope and collapse: Likely secondary to problem #1. Monitor on telemetry. Bedrest. Followup 2-D echo results. IV fluid hydration. 3. Normocytic anemia: Chronic and stable. 4. Thrombocytopenia: Seems to have had it intermittently. No bleeding reported. Follow CBC while on IV heparin infusion. 5. Headache: No focal deficits. States that she has had the headache since her brother's demise 2 weeks ago. Unable to see if she hit her head during the syncopal episode. Followup CT head. When necessary pain medications. 6. History of hyperlipidemia: Not on medications at home. 7. Morbid obesity: 8. Brothers demise recently: Etiology not known. It would be interesting to know his cause of death to see if he may have had a PE. Patient states that she got a call today stating that cause of that may not be known for another 4-6 weeks.    Code Status: Full  Family Communication: None at bedside  Disposition Plan: Admit to  step down. Home when medically stable   Time spent: 70 minutes.  Hannah Scott, MD, FACP, FHM. [facility] Pager [phone]  If 7PM-7AM, please contact night-coverage [email or web address] Password [facility] 09/04/2013, 3:53 PM

## 2013-09-04 NOTE — ED Notes (Signed)
Patient states she was in the bathroom and passes out. Patient states she landed in the bath rub and does not know how long she was out. Patient states she now has a headache and felt like her heart was racing.

## 2013-09-04 NOTE — ED Notes (Signed)
Echo at bedside

## 2013-09-04 NOTE — Progress Notes (Signed)
Echocardiogram 2D Echocardiogram has been performed.  ,  09/04/2013, 3:57 PM

## 2013-09-04 NOTE — Progress Notes (Signed)
*  PRELIMINARY RESULTS* Vascular Ultrasound Lower extremity venous duplex has been completed.  Preliminary findings: DVT involving the right popliteal, posterior tibial, and peroneal veins. No DVT of LLE.  Farrel Demark, RDMS, RVT  09/04/2013, 1:45 PM

## 2013-09-04 NOTE — ED Notes (Signed)
MD Nanavati notified per [place] there is DVT in right leg.

## 2013-09-04 NOTE — ED Notes (Signed)
Per MD-hold Heparin for now.

## 2013-09-05 DIAGNOSIS — D518 Other vitamin B12 deficiency anemias: Secondary | ICD-10-CM

## 2013-09-05 DIAGNOSIS — D649 Anemia, unspecified: Secondary | ICD-10-CM

## 2013-09-05 DIAGNOSIS — I2699 Other pulmonary embolism without acute cor pulmonale: Principal | ICD-10-CM

## 2013-09-05 DIAGNOSIS — I82409 Acute embolism and thrombosis of unspecified deep veins of unspecified lower extremity: Secondary | ICD-10-CM

## 2013-09-05 DIAGNOSIS — D696 Thrombocytopenia, unspecified: Secondary | ICD-10-CM

## 2013-09-05 LAB — LUPUS ANTICOAGULANT PANEL
DRVVT: 36.5 s (ref <42.9)
LUPUS ANTICOAGULANT: NOT DETECTED
PTT LA: 38.1 s (ref 28.0–43.0)

## 2013-09-05 LAB — HEPARIN LEVEL (UNFRACTIONATED): Heparin Unfractionated: 0.69 IU/mL (ref 0.30–0.70)

## 2013-09-05 LAB — TROPONIN I
Troponin I: <0.3 ng/mL (ref <0.30)
Troponin I: <0.3 ng/mL (ref <0.30)

## 2013-09-05 LAB — CARDIOLIPIN ANTIBODIES, IGG, IGM, IGA
ANTICARDIOLIPIN IGA: 9 U/mL — AB (ref <22)
ANTICARDIOLIPIN IGG: 9 GPL U/mL — AB (ref <23)
Anticardiolipin IgM: 3 MPL U/mL — ABNORMAL LOW (ref <11)

## 2013-09-05 LAB — BETA-2-GLYCOPROTEIN I ABS, IGG/M/A
Beta-2 Glyco I IgG: 13 G Units (ref <20)
Beta-2-Glycoprotein I IgA: 4 A Units (ref <20)
Beta-2-Glycoprotein I IgM: 3 M Units (ref <20)

## 2013-09-05 LAB — CBC
HEMATOCRIT: 30.7 % — AB (ref 36.0–46.0)
Hemoglobin: 10.3 g/dL — ABNORMAL LOW (ref 12.0–15.0)
MCH: 28.3 pg (ref 26.0–34.0)
MCHC: 33.6 g/dL (ref 30.0–36.0)
MCV: 84.3 fL (ref 78.0–100.0)
Platelets: 111 10*3/uL — ABNORMAL LOW (ref 150–400)
RBC: 3.64 MIL/uL — ABNORMAL LOW (ref 3.87–5.11)
RDW: 14.9 % (ref 11.5–15.5)
WBC: 5.2 10*3/uL (ref 4.0–10.5)

## 2013-09-05 LAB — PROTEIN C ACTIVITY: PROTEIN C ACTIVITY: 152 % — AB (ref 75–133)

## 2013-09-05 LAB — MAGNESIUM: MAGNESIUM: 1.7 mg/dL (ref 1.5–2.5)

## 2013-09-05 LAB — TSH: TSH: 3.35 u[IU]/mL (ref 0.350–4.500)

## 2013-09-05 LAB — PROTEIN S ACTIVITY: PROTEIN S ACTIVITY: 76 % (ref 69–129)

## 2013-09-05 MED ORDER — MAGNESIUM SULFATE IN D5W 10-5 MG/ML-% IV SOLN
1.0000 g | Freq: Once | INTRAVENOUS | Status: AC
  Administered 2013-09-05: 1 g via INTRAVENOUS
  Filled 2013-09-05: qty 100

## 2013-09-05 NOTE — Progress Notes (Signed)
Dr. Waymon Amato notified pt c/o "tightness" in her upper left chest.  RN continuing to monitor.

## 2013-09-05 NOTE — Progress Notes (Signed)
ANTICOAGULATION CONSULT NOTE - Initial Consult  Pharmacy Consult for:  IV heparin Indication:  RLE DVT and acute pulmonary embolism   Allergies  Allergen Reactions  . Oxaprozin     angioedema  . Sulfonamide Derivatives     REACTION: hives  . Orange Juice [Orange Oil] Hives    Orange juice  . Tomato Hives    tomatoes  . Nsaids Rash    Patient Measurements: 08/18/13 - Height 170 cm  Weight 114.3 kg Heparin Dosing Weight: 88kg  Vital Signs: Temp: 98.4 F (36.9 C) (09/04 0800) Temp src: Oral (09/04 0800) BP: 146/89 mmHg (09/04 0800) Pulse Rate: 47 (09/04 0800)  Labs:  Recent Labs  09/04/13 1017 09/04/13 1441 09/04/13 2246 09/05/13 0330  HGB 11.2*  --   --  10.3*  HCT 34.7*  --   --  30.7*  PLT 116*  --   --  111*  APTT  --  35  --   --   LABPROT  --  13.9  --   --   INR  --  1.07  --   --   HEPARINUNFRC  --   --  0.55 0.69  CREATININE 0.65  --   --   --   TROPONINI <0.30  --   --   --     Medical History: Past Medical History  Diagnosis Date  . Headache(784.0) 06/21/2009    recurrent  . VITAMIN B12 DEFICIENCY 03/18/2009  . ASTHMA 03/17/2009  . ALLERGIC RHINITIS 03/17/2009  . ECZEMA 03/17/2009  . DISC DISEASE, LUMBAR 03/17/2009    mild facet arthropathy by MRI 2005  . HYPERLIPIDEMIA 03/17/2009  . ANEMIA-NOS 03/17/2009  . KELOID 06/21/2009    Assessment: 43 year-old female with extensive RLE DVT and acute submassive pulmonary embolism with right heart strain.  Baseline PT/INR and aPTT are within normal limits.    Factors which may increase the risk for bleeding:  thrombocytopenia  CT head report states no evidence of acute abnormality, including hemorrhage.  Significant Events: 9/4: Heparin 2500 units as bolus dose, then infusion at 1500 units/hr.  2246 HL = 0.55  Today 9/4: 0330 HL = 0.69 within range. Hgb, plt low but stable.   Goals of Therapy:   Heparin level 0.3-0.7 units/ml  Monitor platelets by anticoagulation protocol: Yes   Plan:    Continue Heparin infusion at 1500 units/hr  Heparin level and CBC daily  Loma Boston, PharmD Pager: [phone] 09/05/2013 9:24 AM

## 2013-09-05 NOTE — Progress Notes (Signed)
Dr. Waymon Amato notified pt's discomfort has not improved, she continues to c/o "tightness" in upper left chest.  Trop, Mg and EKG ordered.  RN continuing to monitor.

## 2013-09-05 NOTE — Progress Notes (Signed)
Rx Brief Anticoagulation note:  IV Heparin  Assessement:  HL=0.55 units/hr after 2500 bolus and drip @ 1500 units/hr  No IV infusion problems or bleeding per RN.  HL @ goal (0.3-0.7)  Plan:  Continue @ current rate  Recheck HL in am  Thanks! Lorenza Evangelist 09/05/2013 12:12 AM

## 2013-09-05 NOTE — Progress Notes (Signed)
PROGRESS NOTE    CARMINE YOUNGBERG ZOX:[id number] DOB: October 14, 1970 DOA: 09/04/2013 PCP: Oliver Barre, MD  HPI/Brief narrative 43 y.o. female with history of vitamin B 12 deficiency, HLD, anemia, recent demise of a young brother in the ED from cardiac arrest of unknown etiology (referred to medical examiner), no prior history of VTE, presented to the ED with DOE, palpitations, dizziness, lightheadedness and an episode of syncope. Further workup in the ED revealed extensive right lower extremity DVT and CT angiogram of the chest shows some massive pulmonary embolism with evidence of right heart strain. He was admitted to step down unit and started on IV heparin drip.  Assessment/Plan:  1. Acute submassive pulmonary embolism with right heart strain/extensive right lower extremity DVT: Hemodynamically stable. Admitted to step down unit for close monitoring and management. Hypercoagulable panel drawn and pending. Started on IV heparin per pharmacy. Continue IV fluid hydration to keep up preload. Consulted hematology 9/4. Continue IV heparin for now and hold off oral anticoagulants until hematology input-may be a candidate for NOAC's. 2-D echo: Mild LVH, LVEF 55-60%, systolic flattening of ventricular septum, RV dilatation and moderately reduced systolic function.? Provoked event secondary to Mirena IUD versus familial etiology. Will need anticoagulation for at least 3-6 months and longer if hypercoagulable etiology found. 2. Syncope and collapse: Likely secondary to problem #1. Monitor on telemetry. Bedrest with bathroom privileges. 2-D echo results as above. Telemetry shows sinus bradycardia in the 50s. 3. Normocytic anemia: Chronic. Slight drop in hemoglobin without reported bleeding. Follow CBCs. 4. Thrombocytopenia: Seems to have had it intermittently. No bleeding reported. Follow CBC while on IV heparin infusion. Stable. 5. Headache: No focal deficits. States that she has had the headache since her  brother's demise 2 weeks ago. Unable to say if she hit her head during the syncopal episode. CT head without acute findings. 6. History of hyperlipidemia: Not on medications at home. 7. Morbid obesity: 8. Brothers demise recently: Etiology not known. It would be interesting to know his cause of death to see if he may have had a PE. Patient states that she got a call 9/3 stating that cause of his demise may not be known for another 4-6 weeks. 9. B12 deficiency: May have to increase supplements.    Code Status: Full Family Communication: Patient declines offer to speak to family. States that she will update her mother. Disposition Plan: Continue monitoring in step down unit for additional 24 hours.   Consultants:  Hematology  Procedures:  2-D echo 09/04/13: Study Conclusions  - Left ventricle: The cavity size was normal. Wall thickness was increased in a pattern of mild LVH. Systolic function was normal. The estimated ejection fraction was in the range of 55% to 60%. Wall motion was normal; there were no regional wall motion abnormalities. - Ventricular septum: The contour showed systolic flattening. - Right ventricle: The cavity size was dilated. Wall thickness was normal. Systolic function was moderately reduced. - Right atrium: The atrium was mildly dilated. - Pulmonary arteries: No TR jet seen to measure pulmonary pressure by , so cannot estimate pulmonary artery pressures.  Bilateral lower extremity venous Doppler 09/04/13:  Lower extremity venous duplex has been completed. Preliminary findings: DVT involving the right popliteal, posterior tibial, and peroneal veins. No DVT of LLE.   Antibiotics:  None   Subjective: No dizziness, lightheadedness or dyspnea. Transient chest discomfort this morning while having BM-resolved.  Objective: Filed Vitals:   09/05/13 0400 09/05/13 0500 09/05/13 0800 09/05/13 1200  BP: 128/84  146/89   Pulse: 63  47   Temp: 98.5 F (36.9 C)   98.4 F (36.9 C) 98.1 F (36.7 C)  TempSrc: Oral  Oral Oral  Resp: 16  17   Height:      Weight:  116.4 kg (256 lb 9.9 oz)    SpO2: 99%  95%     Intake/Output Summary (Last 24 hours) at 09/05/13 1515 Last data filed at 09/05/13 1200  Gross per 24 hour  Intake 1628.67 ml  Output   1800 ml  Net -171.33 ml   Filed Weights   09/04/13 2020 09/05/13 0500  Weight: 115.2 kg (253 lb 15.5 oz) 116.4 kg (256 lb 9.9 oz)     Exam:  General exam: Pleasant young female lying comfortably in bed. Moderately built and morbidly obese. Respiratory system: Clear. No increased work of breathing. Cardiovascular system: S1 & S2 heard, RRR. No JVD, murmurs, gallops, clicks or pedal edema. Telemetry: Sinus bradycardia in the 50s (technical difficulties with monitor) Gastrointestinal system: Abdomen is nondistended, soft and nontender. Normal bowel sounds heard. Central nervous system: Alert and oriented. No focal neurological deficits. Extremities: Symmetric 5 x 5 power.   Data Reviewed: Basic Metabolic Panel:  Recent Labs Lab 09/04/13 1017  NA 141  K 4.0  CL 104  CO2 25  GLUCOSE 103*  BUN 13  CREATININE 0.65  CALCIUM 9.3   Liver Function Tests:  Recent Labs Lab 09/04/13 1017  AST 47*  ALT 41*  ALKPHOS 55  BILITOT 0.3  PROT 7.0  ALBUMIN 3.7   No results found for this basename: LIPASE, AMYLASE,  in the last 168 hours No results found for this basename: AMMONIA,  in the last 168 hours CBC:  Recent Labs Lab 09/04/13 1017 09/05/13 0330  WBC 4.4 5.2  HGB 11.2* 10.3*  HCT 34.7* 30.7*  MCV 85.3 84.3  PLT 116* 111*   Cardiac Enzymes:  Recent Labs Lab 09/04/13 1017  TROPONINI <0.30   BNP (last 3 results) No results found for this basename: PROBNP,  in the last 8760 hours CBG: No results found for this basename: GLUCAP,  in the last 168 hours  Recent Results (from the past 240 hour(s))  MRSA PCR SCREENING     Status: None   Collection Time    09/04/13  7:13 PM       Result Value Ref Range Status   MRSA by PCR NEGATIVE  NEGATIVE Final   Comment:            The GeneXpert MRSA Assay (FDA     approved for NASAL specimens     only), is one component of a     comprehensive MRSA colonization     surveillance program. It is not     intended to diagnose MRSA     infection nor to guide or     monitor treatment for     MRSA infections.      Additional labs: 1. Vitamin B12: 174, folate: 6.3, homocysteine: 8.5, antithrombin 3:83%, protein C activity: 152, protein S. activity: 76     Studies: Ct Head Wo Contrast  09/04/2013   CLINICAL DATA:  Syncopal episode  EXAM: CT HEAD WITHOUT CONTRAST  TECHNIQUE: Contiguous axial images were obtained from the base of the skull through the vertex without intravenous contrast.  COMPARISON:  None.  FINDINGS: Skull and Sinuses:Negative for fracture or destructive process. The mastoids, middle ears, and imaged paranasal sinuses are clear.  Orbits: No acute abnormality.  Brain: No evidence of acute abnormality, such as acute infarction, hemorrhage, hydrocephalus, or mass lesion/mass effect. Mild cerebellar tonsillar ectopia. No foramen magnum crowding. Question small white matter low-attenuation focus subinsular on the right. This usually represents a remote small vessel insult, traumatic injury, or demyelination focus. Cavum septum pellucidum et vergae.  IMPRESSION: No acute intracranial findings.   Electronically Signed   By: Tiburcio Pea M.D.   On: 09/04/2013 16:30   Ct Angio Chest Pe W/cm &/or Wo Cm  09/04/2013   CLINICAL DATA:  Syncopal episode with chest pain and known history of deep venous thrombosis.  EXAM: CT ANGIOGRAPHY CHEST WITH CONTRAST  TECHNIQUE: Multidetector CT imaging of the chest was performed using the standard protocol during bolus administration of intravenous contrast. Multiplanar CT image reconstructions and MIPs were obtained to evaluate the vascular anatomy.  CONTRAST:  OMNIPAQUE IOHEXOL 350 MG/ML  SOLN  COMPARISON:  None.  FINDINGS: The lungs are well-aerated without focal infiltrate or sizable effusion. No pneumothorax or parenchymal nodule is seen. No significant hilar or mediastinal adenopathy is noted.  The pulmonary artery shows significant bilateral pulmonary emboli predominately on the right and to a lesser degree on the left. The RV the LV ratios 1.1 for which is consistent with sub massive pulmonary embolism and right heart strain. The thoracic aorta is within normal limits. No other focal vascular abnormality is seen.  Scanning into the upper abdomen reveals no acute abnormality. The osseous structures are within normal limits.  Review of the MIP images confirms the above findings.  IMPRESSION: Positive for acute PE with CT evidence of right heart strain (RV/LV Ratio = 1.14) consistent with at least submassive (intermediate risk)PE. The presence of right heart strain has been associated with an increased risk of morbidity and mortality. Consultation with Pulmonary and Critical Care Medicine is recommended.  No other focal abnormality is noted.  Critical Value/emergent results were called by telephone at the time of interpretation on 09/04/2013 at 2:29 pm to Dr. Derwood Kaplan , who verbally acknowledged these results.   Electronically Signed   By: Alcide Clever M.D.   On: 09/04/2013 14:29        Scheduled Meds: . famotidine  20 mg Oral BID  . sodium chloride  3 mL Intravenous Q12H  . cyanocobalamin  2,000 mcg Oral Daily   Continuous Infusions: . sodium chloride 125 mL/hr at 09/05/13 1403  . heparin 1,500 Units/hr (09/05/13 0558)    Principal Problem:   Acute pulmonary embolism-submassive with right heart strain Active Problems:   HYPERLIPIDEMIA   ANEMIA-NOS   Headache(784.0)   Obesity   Acute deep vein thrombosis (DVT) of other specified vein of right lower extremity   Thrombocytopenia   Syncope and collapse    Time spent: 40 minutes.    Marcellus Scott, MD, FACP,  FHM. [facility] Pager [phone]  If 7PM-7AM, please contact night-coverage [email or web address] Password [email or web address] 09/05/2013, 3:15 PM    LOS: 1 day

## 2013-09-05 NOTE — Consult Note (Signed)
INPATIENT HEMATOLOGY- ONCOLOGY CONSULTATION  Patient Care Team: Corwin Levins, MD as PCP - General  REASON FOR CONSULT:  PE/ Deep Venous Thrombosis (DVT)  Consulting Physician: Arlan Organ, MD  HISTORY OF PRESENTING ILLNESS:  Hannah Thompson 43 y.o. female admitted on 09/04/13 after suffering a syncopal episode.The patient presented herself to the hospital, after regaining consciousness.these event follows recent loss in the family, as her brother died with cardiac arrest of unknown etiology, 3 weeks ago.on physical exam, her right lower extremity was noted to be larger than the left, which prompted her to undergo Doppler examination. These revealed extensive right lower extremity DVT. A CT angiogram of the chest on 09/04/2013, showed massive pulmonary embolism, with evidence of right heart strain. That's, she was admitted for further workup, and management of symptoms. She was placed on heparin per pharmacy for anticoagulation.EKG was unremarkable.CT of the head without contrast is pending. Hypercoagulable panel is pending. Currently, the patient is less short of breath at rest, but she does have some exertional chest pain.She denies any lower extremity pain. She does have a sedentary lifestyle.She spends most of the day standing, do to her jaw as a hairstylist .Other risk factors include IUD placement in 2011, mother with a history of clot, hyperlipidemia, and obesity. she was on no aspirating prior to admission.No recent trips. She had no recent history of cigarette use, or recreational drug use. In the past, she had 2 miscarriages, but she has 3 children in good health. No  history of peripartum thromboembolic event. No recent surgeries. No recent PICC line placement.Denies andy  prior history or diagnosis of cancer. His screening programs are up-to-date.   MEDICAL HISTORY:  Past Medical History  Diagnosis Date  . Headache(784.0) 06/21/2009    recurrent  . VITAMIN B12 DEFICIENCY 03/18/2009  .  ASTHMA 03/17/2009  . ALLERGIC RHINITIS 03/17/2009  . ECZEMA 03/17/2009  . DISC DISEASE, LUMBAR 03/17/2009    mild facet arthropathy by MRI 2005  . HYPERLIPIDEMIA 03/17/2009  . ANEMIA-NOS 03/17/2009  . KELOID 06/21/2009    SURGICAL HISTORY: Past Surgical History  Procedure Laterality Date  . Keloid excision  mid 1990's    s/p keloid removal-laser     SOCIAL HISTORY: History   Social History  . Marital Status: Single    Spouse Name: N/A    Number of Children: 3  . Years of Education: N/A   Occupational History  . Hair stylist    Social History Main Topics  . Smoking status: Former Games developer  . Smokeless tobacco: Never Used  . Alcohol Use: No     Comment: socially  . Drug Use: No  . Sexual Activity: Not Currently    Birth Control/ Protection: IUD   Other Topics Concern  . Not on file   Social History Narrative  . No narrative on file    FAMILY HISTORY: Family History  Problem Relation Age of Onset  . Cancer Father     Prostate Cancer  . Hypertension Father   . Allergies Sister   . Cancer Paternal Aunt     Ovarian Cancer  . Cancer Other     Grandparent-Lung Cancer  . Diabetes Other     Grandparent  . Heart disease Other     Grandparent  . Alcohol abuse Other     Several on both sides of family-3 uncles  . Hypertension Mother   . Diabetes Mother   . Deep vein thrombosis Sister     ALLERGIES:  is allergic  to oxaprozin; sulfonamide derivatives; orange juice; tomato; and nsaids.  Scheduled Meds: . famotidine  20 mg Oral BID  . sodium chloride  3 mL Intravenous Q12H  . cyanocobalamin  2,000 mcg Oral Daily   Continuous Infusions: . sodium chloride 125 mL/hr at 09/05/13 0558  . heparin 1,500 Units/hr (09/05/13 0558)   PRN Meds:.acetaminophen, acetaminophen, albuterol, hydrOXYzine, ondansetron (ZOFRAN) IV, ondansetron  Constitutional: Denies fevers, chills or abnormal night sweats.she has migraine headaches Eyes: Denies blurriness of vision, double vision or  watery eyes Ears, nose, mouth, throat, and face: Denies mucositis or sore throat Respiratory: Denies cough, she does have exertional dyspnea,no  wheezes Cardiovascular: Denies palpitation, chest discomfort is present during exertion ;lower extremity swelling as in history of present illness Gastrointestinal:  Denies nausea, heartburn or change in bowel habits Skin: Denies abnormal skin rashes. she does have a history of eczema, which has flares intermittently. She also has a history of keloids Lymphatics: Denies new lymphadenopathy or easy bruising Neurological:Denies numbness, tingling or new weaknesses Behavioral/Psych: Mood is stable, no new changes  All other systems were reviewed with the patient and are negative.   Family History:    Family History  Problem Relation Age of Onset  . Cancer Father     Prostate Cancer  . Hypertension Father   . Allergies Sister   . Cancer Paternal Aunt     Ovarian Cancer  . Cancer Other     Grandparent-Lung Cancer  . Diabetes Other     Grandparent  . Heart disease Other     Grandparent  . Alcohol abuse Other     Several on both sides of family-3 uncles  . Hypertension Mother   . Diabetes Mother   . Deep vein thrombosis Sister     Social History: the patient is single, she has 3 children in good health, lives in [place]. 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 She recently lost her brother to acute cardiac arrest 3 weeks ago. she does not currently smoke, does not partake recreational drugs, but she does drink socially.   Physical Exam   ECOG PERFORMANCE STATUS:1 Symptomatic but completely ambulatory (Restricted in physically strenuous activity but ambulatory and able to carry out work of a light or sedentary nature.   Filed Vitals:   09/05/13 0800  BP: 146/89  Pulse: 47  Temp: 98.4 F (36.9 C)  Resp: 17   Filed Weights   09/04/13 2020 09/05/13 0500  Weight: 253 lb 15.5 oz (115.2 kg) 256 lb 9.9 oz (116.4 kg)     GENERAL:alert, no distress and comfortable SKIN: skin color, texture, turgor are normal, no rashes or significant lesions EYES: normal, conjunctiva are pink and non-injected, sclera clear OROPHARYNX:no exudate, no erythema and lips, buccal mucosa, and tongue normal  NECK: supple, thyroid normal size, non-tender, without nodularity LYMPH:  no palpable lymphadenopathy in the cervical, axillary or inguinal LUNGS: clear to auscultation and percussion with normal breathing effort HEART: regular rate & rhythm and no murmurs and no lower extremity edema.right lower extremity has mild asymmetric swelling compared to the left, but no frank peaking edema.no cords appreciated ABDOMEN:abdomen morbidly obese, soft, non-tender and normal bowel sounds Musculoskeletal:no cyanosis of digits and no clubbing  PSYCH: alert & oriented x 3 with fluent speech NEURO: no focal motor/sensory deficits   LABORATORY DATA:    Recent Labs Lab 09/04/13 1017 09/05/13 0330  WBC 4.4 5.2  HGB 11.2* 10.3*  HCT 34.7* 30.7*  PLT 116* 111*  MCV 85.3 84.3  MCH 27.5 28.3  MCHC 32.3 33.6  RDW 14.8 14.9    Chemistries   Recent Labs Lab 09/04/13 1017  NA 141  K 4.0  CL 104  CO2 25  GLUCOSE 103*  BUN 13  CREATININE 0.65  CALCIUM 9.3    Anemia panel:  No results found for this basename: VITAMINB12, FOLATE, FERRITIN, TIBC, IRON, RETICCTPCT,  in the last 72 hours  Coagulation profile  Recent Labs Lab 09/04/13 1441  INR 1.07    Radiology Studies:      Ct Head Wo Contrast  09/04/2013   CLINICAL DATA:  Syncopal episode  EXAM: CT HEAD WITHOUT CONTRAST  TECHNIQUE: Contiguous axial images were obtained from the base of the skull through the vertex without intravenous contrast.  COMPARISON:  None.  FINDINGS: Skull and Sinuses:Negative for fracture or destructive process. The mastoids, middle ears, and imaged paranasal sinuses are clear.  Orbits: No acute abnormality.  Brain: No evidence of acute abnormality,  such as acute infarction, hemorrhage, hydrocephalus, or mass lesion/mass effect. Mild cerebellar tonsillar ectopia. No foramen magnum crowding. Question small white matter low-attenuation focus subinsular on the right. This usually represents a remote small vessel insult, traumatic injury, or demyelination focus. Cavum septum pellucidum et vergae.  IMPRESSION: No acute intracranial findings.   Electronically Signed   By: Tiburcio Pea M.D.   On: 09/04/2013 16:30   Ct Angio Chest Pe W/cm &/or Wo Cm  09/04/2013   CLINICAL DATA:  Syncopal episode with chest pain and known history of deep venous thrombosis.  EXAM: CT ANGIOGRAPHY CHEST WITH CONTRAST  TECHNIQUE: Multidetector CT imaging of the chest was performed using the standard protocol during bolus administration of intravenous contrast. Multiplanar CT image reconstructions and MIPs were obtained to evaluate the vascular anatomy.  CONTRAST:  OMNIPAQUE IOHEXOL 350 MG/ML SOLN  COMPARISON:  None.  FINDINGS: The lungs are well-aerated without focal infiltrate or sizable effusion. No pneumothorax or parenchymal nodule is seen. No significant hilar or mediastinal adenopathy is noted.  The pulmonary artery shows significant bilateral pulmonary emboli predominately on the right and to a lesser degree on the left. The RV the LV ratios 1.1 for which is consistent with sub massive pulmonary embolism and right heart strain. The thoracic aorta is within normal limits. No other focal vascular abnormality is seen.  Scanning into the upper abdomen reveals no acute abnormality. The osseous structures are within normal limits.  Review of the MIP images confirms the above findings.  IMPRESSION: Positive for acute PE with CT evidence of right heart strain (RV/LV Ratio = 1.14) consistent with at least submassive (intermediate risk)PE. The presence of right heart strain has been associated with an increased risk of morbidity and mortality. Consultation with Pulmonary and  Critical Care Medicine is recommended.  No other focal abnormality is noted.  Critical Value/emergent results were called by telephone at the time of interpretation on 09/04/2013 at 2:29 pm to Dr. Derwood Kaplan , who verbally acknowledged these results.   Electronically Signed   By: Alcide Clever M.D.   On: 09/04/2013 14:29     ASSESSMENT/PLAN:    1. DVT/ PE:  Appears to be provoked by birth control IUD (Mirena), decreased mobility, morbid obesity, hypercholesterolemia, and family history of blood in her mother's side hypercoagulable panel has been drawn, with results pending. This was taken prior to initiation of anticoagulation. She is currently on [facility] pharmacy. Anticoagulation therapies would include  warfarin, low molecular weight heparin such as Lovenox or ne wer  agents such as Rivaroxaban.  Elastic compression stockings at 20-30 mmHg to reduce risks of chronic thrombophlebitis recommended while these options being entertained. Preventive measures such as avoiding hormonal supplement,in her case removing IUD by her GYN, avoiding cigarette smoking, keeping up-to-date with screening programs for early cancer detection, frequent ambulation for long distance travel and aggressive DVT prophylaxis in all surgical settings is recommended.weight loss is recommended as well. An appointment will be arranged upon discharge at the office of Dr. Myna Hidalgo.  If needed preoperativily, she need any interruption of her anticoagulation therapy for elective procedures.  2. B12 Deficiency/ Anemia On B12 supplement.  no transfusion is indicated at this time, has no bleeding issues are apparent  3. Thrombocytopenia Present prior to admission, perhaps exacerbated by heparinization the patient has a history of alcohol consumption, which cessation has been recommended. We'll continue to followup as an outpatient, as no bleeding issues are present at the time, and no transfusion of platelets is indicated  4. Full  Code  other medical issues as they're admitting team. Thank you very much for allowing [place] the opportunity to participate in the care of this nice patient.  **Disclaimer: This note was dictated with voice recognition software. Similar sounding words can inadvertently be transcribed and this note may contain transcription errors which may not have been corrected upon publication of note.**    WERTMAN,SARA E, PA-C @ 11:57 AM    ADDENDUM:  I saw and examined the patient today. She has a large pulmonary embolism. She has a clot in the right leg. On the Doppler, thrombus in the right leg really is from the knee down. There is nothing in her femoral vein.  On her CT angiogram, she has a fairly large clot burden. She has some right heart strain. She was hemodynamically stable. She currently is on heparin.  She has had thrombophilic studies done. Nothing is back yet.  Of note, her brother, passed away suddenly. He was 71 years old. This was 2 weeks ago. Apparently, her sister probably had a blood clot  about 12 years ago.  Her exam is pretty much unremarkable.  She does have some skin issues. I wonder if she needs to be checked for lupus.  Her platelet count was a little on the low side when she came in. She is mildly anemic. There is no history of sickle cell. She is having her monthly cycles. She has a Mirena IUD.  She is not diabetic. She does not smoke. She's been active. She works as a Social worker.  As far as anticoagulation is concerned, I would seriously keep her on heparin for 4 days. I really think this is important. She'll be monitored carefully. Her heparin levels can be adjusted. Pharmacy is doing a very good job with this. I then 4 days of heparin would be beneficial for her. I would then get her on oral anticoagulation. I will put her on Xarelto.  I don't see that any additional tests need to be done right now.  Her platelet count will have to be watched. It was pretty much  normal 6 months ago. A low platelet count with a blood clot might indicate anti-cardiolipin antibodies. Again we will have to see what the thrombophilic panel shows.  She's very nice. I enjoyed talking with her. She does have a strong faith. I will certainly pray for her.  Pete E.  Hebrews 12:12

## 2013-09-05 NOTE — Progress Notes (Signed)
CARE MANAGEMENT NOTE 09/05/2013  Patient:  Hannah Thompson, Hannah Thompson   Account Number:  [id number]  Date Initiated:  09/05/2013  Documentation initiated by:  DAVIS,RHONDA  Subjective/Objective Assessment:   patient passed out in the br at [facility] awoke in the tub and had urinated on the floor. ct scan confirmed:Positive for acute PE with CT evidence of right heart strain (RV/LV  Ratio = 1.14) consistent with at least submassive (intermediate  risk     Action/Plan:   tbd, home   Anticipated DC Date:  09/08/2013   Anticipated DC Plan:  HOME/SELF CARE  In-house referral  NA      DC Planning Services  CM consult      PAC Choice  NA   Choice offered to / List presented to:  NA   DME arranged  NA      DME agency  NA     HH arranged  NA      HH agency  NA   Status of service:  In process, will continue to follow Medicare Important Message given?   (If response is "NO", the following Medicare IM given date fields will be blank) Date Medicare IM given:   Medicare IM given by:   Date Additional Medicare IM given:   Additional Medicare IM given by:    Discharge Disposition:    Per UR Regulation:  Reviewed for med. necessity/level of care/duration of stay  If discussed at Long Length of Stay Meetings, dates discussed:    Comments:  Bjorn Loser Davis,RN,BSN,CCM

## 2013-09-06 LAB — HEPARIN LEVEL (UNFRACTIONATED)
HEPARIN UNFRACTIONATED: 1.1 [IU]/mL — AB (ref 0.30–0.70)
HEPARIN UNFRACTIONATED: 0.62 [IU]/mL (ref 0.30–0.70)
Heparin Unfractionated: 1.1 IU/mL — ABNORMAL HIGH (ref 0.30–0.70)
Heparin Unfractionated: 0.57 IU/mL (ref 0.30–0.70)

## 2013-09-06 LAB — CBC
HEMATOCRIT: 31.7 % — AB (ref 36.0–46.0)
Hemoglobin: 10.6 g/dL — ABNORMAL LOW (ref 12.0–15.0)
MCH: 28.2 pg (ref 26.0–34.0)
MCHC: 33.4 g/dL (ref 30.0–36.0)
MCV: 84.3 fL (ref 78.0–100.0)
Platelets: 122 10*3/uL — ABNORMAL LOW (ref 150–400)
RBC: 3.76 MIL/uL — ABNORMAL LOW (ref 3.87–5.11)
RDW: 14.8 % (ref 11.5–15.5)
WBC: 5 10*3/uL (ref 4.0–10.5)

## 2013-09-06 LAB — TROPONIN I: Troponin I: <0.3 ng/mL (ref <0.30)

## 2013-09-06 MED ORDER — HEPARIN (PORCINE) IN NACL 100-0.45 UNIT/ML-% IJ SOLN
1250.0000 [IU]/h | INTRAMUSCULAR | Status: AC
  Administered 2013-09-06 – 2013-09-07 (×2): 1250 [IU]/h via INTRAVENOUS
  Filled 2013-09-06 (×5): qty 250

## 2013-09-06 NOTE — Progress Notes (Signed)
ANTICOAGULATION CONSULT NOTE - Follow-up Consult  Pharmacy Consult for:  IV heparin Indication:  RLE DVT and acute pulmonary embolism   Allergies  Allergen Reactions  . Oxaprozin     angioedema  . Sulfonamide Derivatives     REACTION: hives  . Orange Juice [Orange Oil] Hives    Orange juice  . Tomato Hives    tomatoes  . Nsaids Rash    Patient Measurements: 08/18/13 - Height 170 cm  Weight 114.3 kg Heparin Dosing Weight: 88kg  Vital Signs: Temp: 98.4 F (36.9 C) (09/05 1445) Temp src: Oral (09/05 1445) BP: 127/84 mmHg (09/05 1445) Pulse Rate: 50 (09/05 1445)  Labs:  Recent Labs  09/04/13 1017 09/04/13 1441  09/05/13 0330 09/05/13 1724 09/05/13 2305 09/06/13 0507 09/06/13 0803 09/06/13 1545  HGB 11.2*  --   --  10.3*  --   --  10.6*  --   --   HCT 34.7*  --   --  30.7*  --   --  31.7*  --   --   PLT 116*  --   --  111*  --   --  122*  --   --   APTT  --  35  --   --   --   --   --   --   --   LABPROT  --  13.9  --   --   --   --   --   --   --   INR  --  1.07  --   --   --   --   --   --   --   HEPARINUNFRC  --   --   < > 0.69  --   --  1.10* 1.10* 0.62  CREATININE 0.65  --   --   --   --   --   --   --   --   TROPONINI <0.30  --   --   --  <0.30 <0.30 <0.30  --   --   < > = values in this interval not displayed.  Medical History: Past Medical History  Diagnosis Date  . Headache(784.0) 06/21/2009    recurrent  . VITAMIN B12 DEFICIENCY 03/18/2009  . ASTHMA 03/17/2009  . ALLERGIC RHINITIS 03/17/2009  . ECZEMA 03/17/2009  . DISC DISEASE, LUMBAR 03/17/2009    mild facet arthropathy by MRI 2005  . HYPERLIPIDEMIA 03/17/2009  . ANEMIA-NOS 03/17/2009  . KELOID 06/21/2009    Assessment: 43 year-old female with extensive RLE DVT and acute submassive pulmonary embolism with right heart strain.  Baseline PT/INR and aPTT are within normal limits.    Factors which may increase the risk for bleeding:  thrombocytopenia  CT head report states no evidence of acute  abnormality, including hemorrhage.  Significant Events: 9/4: Heparin 2500 units as bolus dose, then infusion at 1500 units/hr.  2246 HL = 0.55 9/5: HL = 0.69, continue infusion @ 1500 units/hr  Today 9/5: 0800 HL = 1.1 SUPRAtherapeutic on Heparin IV @ 1500 units/hr Hgb, plt low but stable. Infusion held x 1 hour, then restarted @ 1250 units/hr. 1545 HL = 0.62 Therapeutic on Heparin IV @ 1250 units/hr   Goals of Therapy:   Heparin level 0.3-0.7 units/ml  Monitor platelets by anticoagulation protocol: Yes   Plan:   Continue Heparin infusion at 1,250 units/hr  Obtain Heparin level in 6 hours  Heparin level and CBC daily  Loma Boston, PharmD Pager: [phone] 09/06/2013  4:43 PM

## 2013-09-06 NOTE — Progress Notes (Addendum)
ANTICOAGULATION CONSULT NOTE - Follow-up Consult  Pharmacy Consult for:  IV heparin Indication:  RLE DVT and acute pulmonary embolism   Allergies  Allergen Reactions  . Oxaprozin     angioedema  . Sulfonamide Derivatives     REACTION: hives  . Orange Juice [Orange Oil] Hives    Orange juice  . Tomato Hives    tomatoes  . Nsaids Rash    Patient Measurements: 08/18/13 - Height 170 cm  Weight 114.3 kg Heparin Dosing Weight: 88kg  Vital Signs: Temp: 98.2 F (36.8 C) (09/05 0800) Temp src: Oral (09/05 0800) BP: 162/103 mmHg (09/05 0800) Pulse Rate: 66 (09/05 0800)  Labs:  Recent Labs  09/04/13 1017 09/04/13 1441  09/05/13 0330 09/05/13 1724 09/05/13 2305 09/06/13 0507 09/06/13 0803  HGB 11.2*  --   --  10.3*  --   --  10.6*  --   HCT 34.7*  --   --  30.7*  --   --  31.7*  --   PLT 116*  --   --  111*  --   --  122*  --   APTT  --  35  --   --   --   --   --   --   LABPROT  --  13.9  --   --   --   --   --   --   INR  --  1.07  --   --   --   --   --   --   HEPARINUNFRC  --   --   < > 0.69  --   --  1.10* 1.10*  CREATININE 0.65  --   --   --   --   --   --   --   TROPONINI <0.30  --   --   --  <0.30 <0.30 <0.30  --   < > = values in this interval not displayed.  Medical History: Past Medical History  Diagnosis Date  . Headache(784.0) 06/21/2009    recurrent  . VITAMIN B12 DEFICIENCY 03/18/2009  . ASTHMA 03/17/2009  . ALLERGIC RHINITIS 03/17/2009  . ECZEMA 03/17/2009  . DISC DISEASE, LUMBAR 03/17/2009    mild facet arthropathy by MRI 2005  . HYPERLIPIDEMIA 03/17/2009  . ANEMIA-NOS 03/17/2009  . KELOID 06/21/2009    Assessment: 43 year-old female with extensive RLE DVT and acute submassive pulmonary embolism with right heart strain.  Baseline PT/INR and aPTT are within normal limits.    Factors which may increase the risk for bleeding:  thrombocytopenia  CT head report states no evidence of acute abnormality, including hemorrhage.  Significant Events: 9/4:  Heparin 2500 units as bolus dose, then infusion at 1500 units/hr.  2246 HL = 0.55 9/5: HL = 0.69, continue infusion @ 1500 units/hr  Today 9/5: 0800 HL = 1.1 SUPRAtherapeutic. Hgb, plt low but stable.   Goals of Therapy:   Heparin level 0.3-0.7 units/ml  Monitor platelets by anticoagulation protocol: Yes   Plan:   Hold Heparin infusion for 1 hour  Restart Heparin infusion at 1,250 units/hr  Obtain Heparin level in 6 hours  Heparin level and CBC daily  Loma Boston, PharmD Pager: [phone] 09/06/2013 9:07 AM

## 2013-09-06 NOTE — Progress Notes (Signed)
Report called to Marissa, RN and pt transferred to [id number]

## 2013-09-06 NOTE — Progress Notes (Signed)
ANTICOAGULATION CONSULT NOTE - Follow-up Consult  Pharmacy Consult for:  IV heparin Indication:  RLE DVT and acute pulmonary embolism   Allergies  Allergen Reactions  . Oxaprozin     angioedema  . Sulfonamide Derivatives     REACTION: hives  . Orange Juice [Orange Oil] Hives    Orange juice  . Tomato Hives    tomatoes  . Nsaids Rash    Patient Measurements: 08/18/13 - Height 170 cm  Weight 114.3 kg Heparin Dosing Weight: 88kg  Vital Signs: Temp: 97.7 F (36.5 C) (09/05 2223) Temp src: Oral (09/05 2223) BP: 155/92 mmHg (09/05 2223) Pulse Rate: 48 (09/05 2223)  Labs:  Recent Labs  09/04/13 1017 09/04/13 1441  09/05/13 0330 09/05/13 1724 09/05/13 2305 09/06/13 0507 09/06/13 0803 09/06/13 1545 09/06/13 2150  HGB 11.2*  --   --  10.3*  --   --  10.6*  --   --   --   HCT 34.7*  --   --  30.7*  --   --  31.7*  --   --   --   PLT 116*  --   --  111*  --   --  122*  --   --   --   APTT  --  35  --   --   --   --   --   --   --   --   LABPROT  --  13.9  --   --   --   --   --   --   --   --   INR  --  1.07  --   --   --   --   --   --   --   --   HEPARINUNFRC  --   --   < > 0.69  --   --  1.10* 1.10* 0.62 0.57  CREATININE 0.65  --   --   --   --   --   --   --   --   --   TROPONINI <0.30  --   --   --  <0.30 <0.30 <0.30  --   --   --   < > = values in this interval not displayed.  Medical History: Past Medical History  Diagnosis Date  . Headache(784.0) 06/21/2009    recurrent  . VITAMIN B12 DEFICIENCY 03/18/2009  . ASTHMA 03/17/2009  . ALLERGIC RHINITIS 03/17/2009  . ECZEMA 03/17/2009  . DISC DISEASE, LUMBAR 03/17/2009    mild facet arthropathy by MRI 2005  . HYPERLIPIDEMIA 03/17/2009  . ANEMIA-NOS 03/17/2009  . KELOID 06/21/2009    Assessment: 43 year-old female with extensive RLE DVT and acute submassive pulmonary embolism with right heart strain.  Baseline PT/INR and aPTT are within normal limits.    Factors which may increase the risk for bleeding:   thrombocytopenia  CT head report states no evidence of acute abnormality, including hemorrhage.  Significant Events: 9/4: Heparin 2500 units as bolus dose, then infusion at 1500 units/hr.  2246 HL = 0.55 9/5: HL = 0.69, continue infusion @ 1500 units/hr      -2150 HL= 0.57, no problems reported  Today 9/5: 0800 HL = 1.1 SUPRAtherapeutic on Heparin IV @ 1500 units/hr Hgb, plt low but stable. Infusion held x 1 hour, then restarted @ 1250 units/hr. 1545 HL = 0.62 Therapeutic on Heparin IV @ 1250 units/hr 2150 HL=0.57- continue @ 1250 units/hr   Goals of Therapy:  Heparin level 0.3-0.7 units/ml  Monitor platelets by anticoagulation protocol: Yes   Plan:   Continue Heparin infusion at 1,250 units/hr  Heparin level and CBC daily   Lorenza Evangelist 09/06/2013 10:24 PM

## 2013-09-06 NOTE — Progress Notes (Signed)
PROGRESS NOTE    Hannah Thompson ZOX:[id number] DOB: 07-08-1970 DOA: 09/04/2013 PCP: Oliver Barre, MD  HPI/Brief narrative 43 y.o. female with history of vitamin B 12 deficiency, HLD, anemia, recent demise of a young brother in the ED from cardiac arrest of unknown etiology (referred to medical examiner), no prior history of VTE, presented to the ED with DOE, palpitations, dizziness, lightheadedness and an episode of syncope. Further workup in the ED revealed extensive right lower extremity DVT and CT angiogram of the chest shows some massive pulmonary embolism with evidence of right heart strain. He was admitted to step down unit and started on IV heparin drip.  Assessment/Plan:  1. Acute submassive pulmonary embolism with right heart strain/extensive right lower extremity DVT: Hemodynamically stable. Admitted to step down unit for close monitoring and management. Hypercoagulable panel drawn-of results that her back-unremarkable . Started on IV heparin per pharmacy. Continue IV fluid hydration to keep up preload. 2-D echo: Mild LVH, LVEF 55-60%, systolic flattening of ventricular septum, RV dilatation and moderately reduced systolic function.? Provoked event secondary to Mirena IUD versus familial etiology. Will need anticoagulation for at least 3-6 months and longer if hypercoagulable etiology found. Hematology input appreciated-recommend continued IV heparin for total 4 days (3/4 days today) and then transition to PO Xarelto. Intermittent fleeting chest discomfort seems muscular but could be from PE. Although EKG had new changes, troponin x3 negative. 2. Syncope and collapse: Likely secondary to problem #1. Monitor on telemetry. Bedrest with bathroom privileges. 2-D echo results as above. Telemetry shows sinus bradycardia in the 40-50s-likely physiological and asymptomatic. TSH normal. 3. Normocytic anemia: Chronic. Slight drop in hemoglobin without reported bleeding. Stable 4. Thrombocytopenia:  Seems to have had it intermittently. No bleeding reported. Follow CBC while on IV heparin infusion. Stable. 5. Headache: No focal deficits. States that she has had the headache since her brother's demise 2 weeks ago. Unable to say if she hit her head during the syncopal episode. CT head without acute findings. No further headaches reported. 6. History of hyperlipidemia: Not on medications at home. 7. Morbid obesity: 8. Brothers demise recently: Etiology not known. It would be interesting to know his cause of death to see if he may have had a PE. Patient states that she got a call 9/3 stating that cause of his demise may not be known for another 4-6 weeks. 9. B12 deficiency: May have to increase supplements.    Code Status: Full Family Communication: Patient declines offer to speak to family. States that she will update her mother. Disposition Plan: Transfer to telemetry. Possible discharge home 09/08/13   Consultants:  Hematology  Procedures:  2-D echo 09/04/13: Study Conclusions  - Left ventricle: The cavity size was normal. Wall thickness was increased in a pattern of mild LVH. Systolic function was normal. The estimated ejection fraction was in the range of 55% to 60%. Wall motion was normal; there were no regional wall motion abnormalities. - Ventricular septum: The contour showed systolic flattening. - Right ventricle: The cavity size was dilated. Wall thickness was normal. Systolic function was moderately reduced. - Right atrium: The atrium was mildly dilated. - Pulmonary arteries: No TR jet seen to measure pulmonary pressure by , so cannot estimate pulmonary artery pressures.  Bilateral lower extremity venous Doppler 09/04/13:  Lower extremity venous duplex has been completed. Preliminary findings: DVT involving the right popliteal, posterior tibial, and peroneal veins. No DVT of LLE.   Antibiotics:  None   Subjective: Intermittent fleeting midsternal discomfort without  worsening dyspnea-at times worse with chest wall movements. Resolves spontaneously.  Objective: Filed Vitals:   09/06/13 0800 09/06/13 0939 09/06/13 0948 09/06/13 0951  BP: 162/103 125/80    Pulse: 66 68    Temp: 98.2 F (36.8 C) 97.9 F (36.6 C)    TempSrc: Oral Oral    Resp: 23 16    Height:     (1.702 m)  Weight:   115.078 kg (253 lb 11.2 oz) 115.078 kg (253 lb 11.2 oz)  SpO2: 100% 100%      Intake/Output Summary (Last 24 hours) at 09/06/13 1153 Last data filed at 09/06/13 0855  Gross per 24 hour  Intake   2800 ml  Output   4050 ml  Net  -1250 ml   Filed Weights   09/06/13 0400 09/06/13 0948 09/06/13 0951  Weight: 116.7 kg (257 lb 4.4 oz) 115.078 kg (253 lb 11.2 oz) 115.078 kg (253 lb 11.2 oz)     Exam:  General exam: Pleasant young female sitting up comfortably in bed. Moderately built and morbidly obese. Respiratory system: Clear. No increased work of breathing. Cardiovascular system: S1 & S2 heard, RRR. No JVD, murmurs, gallops, clicks or pedal edema. Telemetry: Sinus bradycardia in the 40s-50s. Gastrointestinal system: Abdomen is nondistended, soft and nontender. Normal bowel sounds heard. Central nervous system: Alert and oriented. No focal neurological deficits. Extremities: Symmetric 5 x 5 power.   Data Reviewed: Basic Metabolic Panel:  Recent Labs Lab 09/04/13 1017 09/05/13 1733  NA 141  --   K 4.0  --   CL 104  --   CO2 25  --   GLUCOSE 103*  --   BUN 13  --   CREATININE 0.65  --   CALCIUM 9.3  --   MG  --  1.7   Liver Function Tests:  Recent Labs Lab 09/04/13 1017  AST 47*  ALT 41*  ALKPHOS 55  BILITOT 0.3  PROT 7.0  ALBUMIN 3.7   No results found for this basename: LIPASE, AMYLASE,  in the last 168 hours No results found for this basename: AMMONIA,  in the last 168 hours CBC:  Recent Labs Lab 09/04/13 1017 09/05/13 0330 09/06/13 0507  WBC 4.4 5.2 5.0  HGB 11.2* 10.3* 10.6*  HCT 34.7* 30.7* 31.7*  MCV 85.3 84.3 84.3    PLT 116* 111* 122*   Cardiac Enzymes:  Recent Labs Lab 09/04/13 1017 09/05/13 1724 09/05/13 2305 09/06/13 0507  TROPONINI <0.30 <0.30 <0.30 <0.30   BNP (last 3 results) No results found for this basename: PROBNP,  in the last 8760 hours CBG: No results found for this basename: GLUCAP,  in the last 168 hours  Recent Results (from the past 240 hour(s))  MRSA PCR SCREENING     Status: None   Collection Time    09/04/13  7:13 PM      Result Value Ref Range Status   MRSA by PCR NEGATIVE  NEGATIVE Final   Comment:            The GeneXpert MRSA Assay (FDA     approved for NASAL specimens     only), is one component of a     comprehensive MRSA colonization     surveillance program. It is not     intended to diagnose MRSA     infection nor to guide or     monitor treatment for     MRSA infections.      Additional labs: 1. Vitamin  B12: 174, folate: 6.3, homocysteine: 8.5, antithrombin 3:83%, protein C activity: 152, protein S. activity: 76     Studies: Ct Head Wo Contrast  09/04/2013   CLINICAL DATA:  Syncopal episode  EXAM: CT HEAD WITHOUT CONTRAST  TECHNIQUE: Contiguous axial images were obtained from the base of the skull through the vertex without intravenous contrast.  COMPARISON:  None.  FINDINGS: Skull and Sinuses:Negative for fracture or destructive process. The mastoids, middle ears, and imaged paranasal sinuses are clear.  Orbits: No acute abnormality.  Brain: No evidence of acute abnormality, such as acute infarction, hemorrhage, hydrocephalus, or mass lesion/mass effect. Mild cerebellar tonsillar ectopia. No foramen magnum crowding. Question small white matter low-attenuation focus subinsular on the right. This usually represents a remote small vessel insult, traumatic injury, or demyelination focus. Cavum septum pellucidum et vergae.  IMPRESSION: No acute intracranial findings.   Electronically Signed   By: Tiburcio Pea M.D.   On: 09/04/2013 16:30   Ct Angio Chest  Pe W/cm &/or Wo Cm  09/04/2013   CLINICAL DATA:  Syncopal episode with chest pain and known history of deep venous thrombosis.  EXAM: CT ANGIOGRAPHY CHEST WITH CONTRAST  TECHNIQUE: Multidetector CT imaging of the chest was performed using the standard protocol during bolus administration of intravenous contrast. Multiplanar CT image reconstructions and MIPs were obtained to evaluate the vascular anatomy.  CONTRAST:  OMNIPAQUE IOHEXOL 350 MG/ML SOLN  COMPARISON:  None.  FINDINGS: The lungs are well-aerated without focal infiltrate or sizable effusion. No pneumothorax or parenchymal nodule is seen. No significant hilar or mediastinal adenopathy is noted.  The pulmonary artery shows significant bilateral pulmonary emboli predominately on the right and to a lesser degree on the left. The RV the LV ratios 1.1 for which is consistent with sub massive pulmonary embolism and right heart strain. The thoracic aorta is within normal limits. No other focal vascular abnormality is seen.  Scanning into the upper abdomen reveals no acute abnormality. The osseous structures are within normal limits.  Review of the MIP images confirms the above findings.  IMPRESSION: Positive for acute PE with CT evidence of right heart strain (RV/LV Ratio = 1.14) consistent with at least submassive (intermediate risk)PE. The presence of right heart strain has been associated with an increased risk of morbidity and mortality. Consultation with Pulmonary and Critical Care Medicine is recommended.  No other focal abnormality is noted.  Critical Value/emergent results were called by telephone at the time of interpretation on 09/04/2013 at 2:29 pm to Dr. Derwood Kaplan , who verbally acknowledged these results.   Electronically Signed   By: Alcide Clever M.D.   On: 09/04/2013 14:29        Scheduled Meds: . famotidine  20 mg Oral BID  . sodium chloride  3 mL Intravenous Q12H  . cyanocobalamin  2,000 mcg Oral Daily   Continuous  Infusions: . sodium chloride 50 mL/hr at 09/06/13 0917  . heparin 1,250 Units/hr (09/06/13 1007)    Principal Problem:   Acute pulmonary embolism-submassive with right heart strain Active Problems:   HYPERLIPIDEMIA   ANEMIA-NOS   Headache(784.0)   Obesity   Acute deep vein thrombosis (DVT) of other specified vein of right lower extremity   Thrombocytopenia   Syncope and collapse    Time spent: 30 minutes.    Marcellus Scott, MD, FACP, FHM. [facility] Pager [phone]  If 7PM-7AM, please contact night-coverage [email or web address] Password [email or web address] 09/06/2013, 11:53 AM    LOS: 2 days

## 2013-09-07 ENCOUNTER — Inpatient Hospital Stay (HOSPITAL_COMMUNITY): Payer: No Typology Code available for payment source

## 2013-09-07 DIAGNOSIS — R071 Chest pain on breathing: Secondary | ICD-10-CM

## 2013-09-07 LAB — CBC
HCT: 36.3 % (ref 36.0–46.0)
Hemoglobin: 11.8 g/dL — ABNORMAL LOW (ref 12.0–15.0)
MCH: 27.4 pg (ref 26.0–34.0)
MCHC: 32.5 g/dL (ref 30.0–36.0)
MCV: 84.4 fL (ref 78.0–100.0)
PLATELETS: 147 10*3/uL — AB (ref 150–400)
RBC: 4.3 MIL/uL (ref 3.87–5.11)
RDW: 14.9 % (ref 11.5–15.5)
WBC: 5.1 10*3/uL (ref 4.0–10.5)

## 2013-09-07 LAB — PROTHROMBIN GENE MUTATION

## 2013-09-07 LAB — FACTOR 5 LEIDEN

## 2013-09-07 LAB — HEPARIN LEVEL (UNFRACTIONATED): HEPARIN UNFRACTIONATED: 0.57 [IU]/mL (ref 0.30–0.70)

## 2013-09-07 MED ORDER — DIPHENHYDRAMINE HCL 25 MG PO CAPS
25.0000 mg | ORAL_CAPSULE | Freq: Four times a day (QID) | ORAL | Status: DC | PRN
  Administered 2013-09-07: 25 mg via ORAL
  Filled 2013-09-07: qty 1

## 2013-09-07 NOTE — Progress Notes (Signed)
CARE MANAGEMENT NOTE 09/07/2013  Patient:  Hannah Thompson, Hannah Thompson   Account Number:  [id number]  Date Initiated:  09/05/2013  Documentation initiated by:  DAVIS,RHONDA  Subjective/Objective Assessment:   patient passed out in the br at [facility] awoke in the tub and had urinated on the floor. ct scan confirmed:Positive for acute PE with CT evidence of right heart strain (RV/LV  Ratio = 1.14) consistent with at least submassive (intermediate  risk     Action/Plan:   tbd, home   Anticipated DC Date:  09/08/2013   Anticipated DC Plan:  HOME/SELF CARE  In-house referral  NA      DC Planning Services  CM consult  Medication Assistance      PAC Choice  NA   Choice offered to / List presented to:  NA   DME arranged  NA      DME agency  NA     HH arranged  NA      HH agency  NA   Status of service:  In process, will continue to follow Medicare Important Message given?   (If response is "NO", the following Medicare IM given date fields will be blank) Date Medicare IM given:   Medicare IM given by:   Date Additional Medicare IM given:   Additional Medicare IM given by:    Discharge Disposition:    Per UR Regulation:  Reviewed for med. necessity/level of care/duration of stay  If discussed at Long Length of Stay Meetings, dates discussed:    Comments:   09/07/2013 1145 Referral for Xarelto coverage post dc and if prior auth needed. Will check benefits on 09/09/2013. Xarelto has a 30 day free trial card and $ 0- 10 copay card with medication for commercial payors. NCM will continue to follow up until for dc needs. Isidoro Donning RN CCM Case Mgmt phone [phone]  [facility]

## 2013-09-07 NOTE — Progress Notes (Signed)
Pt complaining of swelling in right forearm, no numbness or tingling, radial pulse palpated. Applied heat pack. Paged NP on call. NP on call came and assessed pt and gave new orders for benadryl. Benadryl given, will continue to monitor.

## 2013-09-07 NOTE — Progress Notes (Addendum)
PROGRESS NOTE    Hannah Thompson WRU:[id number] DOB: March 29, 1970 DOA: 09/04/2013 PCP: Oliver Barre, MD  HPI/Brief narrative 43 y.o. female with history of vitamin B 12 deficiency, HLD, anemia, recent demise of a young brother in the ED from cardiac arrest of unknown etiology (referred to medical examiner), no prior history of VTE, presented to the ED with DOE, palpitations, dizziness, lightheadedness and an episode of syncope. Further workup in the ED revealed extensive right lower extremity DVT and CT angiogram of the chest shows some massive pulmonary embolism with evidence of right heart strain. He was admitted to step down unit and started on IV heparin drip.  Assessment/Plan:  1. Acute submassive pulmonary embolism with right heart strain/extensive right lower extremity DVT: Hemodynamically stable. Admitted to step down unit for close monitoring and management. Hypercoagulable panel drawn-of results that her back-unremarkable . Started on IV heparin per pharmacy. Continue IV fluid hydration to keep up preload. 2-D echo: Mild LVH, LVEF 55-60%, systolic flattening of ventricular septum, RV dilatation and moderately reduced systolic function.? Provoked event secondary to Mirena IUD versus familial etiology. Will need anticoagulation for at least 3-6 months and longer if hypercoagulable etiology found. Hematology input appreciated-recommend continued IV heparin for total 4 days (4/4 days today) and then transition to PO Xarelto on 9/7. Intermittent fleeting chest discomfort seems muscular/pleuritic that could be from PE. Although EKG had new changes, troponin x3 negative. Will get repeat chest x-ray. 2. Syncope and collapse: Likely secondary to problem #1. Monitor on telemetry. Bedrest with bathroom privileges. 2-D echo results as above. Telemetry shows sinus bradycardia in the 40-50s-likely physiological and asymptomatic. TSH normal. 3. Normocytic anemia: Chronic. Slight drop in hemoglobin without  reported bleeding. Stable 4. Thrombocytopenia: Seems to have had it intermittently. No bleeding reported. Follow CBC while on IV heparin infusion. Stable. 5. Headache: No focal deficits. States that she has had the headache since her brother's demise 2 weeks ago. Unable to say if she hit her head during the syncopal episode. CT head without acute findings. No further headaches reported. 6. History of hyperlipidemia: Not on medications at home. 7. Morbid obesity: 8. Brothers demise recently: Etiology not known. It would be interesting to know his cause of death to see if he may have had a PE. Patient states that she got a call 9/3 stating that cause of his demise may not be known for another 4-6 weeks. 9. B12 deficiency: May have to increase supplements.    Code Status: Full Family Communication: Patient declines offer to speak to family. States that she will update her mother. Disposition Plan: Transfer to telemetry. Possible discharge home 09/08/13 if we can figure out whether her insurance will cover Xarelto (Labor Day weekend), otherwise DC 9/8   Consultants:  Hematology  Procedures:  2-D echo 09/04/13: Study Conclusions  - Left ventricle: The cavity size was normal. Wall thickness was increased in a pattern of mild LVH. Systolic function was normal. The estimated ejection fraction was in the range of 55% to 60%. Wall motion was normal; there were no regional wall motion abnormalities. - Ventricular septum: The contour showed systolic flattening. - Right ventricle: The cavity size was dilated. Wall thickness was normal. Systolic function was moderately reduced. - Right atrium: The atrium was mildly dilated. - Pulmonary arteries: No TR jet seen to measure pulmonary pressure by , so cannot estimate pulmonary artery pressures.  Bilateral lower extremity venous Doppler 09/04/13:  Lower extremity venous duplex has been completed. Preliminary findings: DVT involving the  right popliteal,  posterior tibial, and peroneal veins. No DVT of LLE.   Antibiotics:  None   Subjective: Intermittent lower substernal and pain underneath left breast, made worse with deep inspiration. DOE.  Objective: Filed Vitals:   09/06/13 0951 09/06/13 1445 09/06/13 2223 09/07/13 0501  BP:  127/84 155/92 127/97  Pulse:  50 48 52  Temp:  98.4 F (36.9 C) 97.7 F (36.5 C) 98.1 F (36.7 C)  TempSrc:  Oral Oral Oral  Resp:  Height:  (1.702 m)     Weight: 115.078 kg (253 lb 11.2 oz)     SpO2:  100% 100% 100%    Intake/Output Summary (Last 24 hours) at 09/07/13 0909 Last data filed at 09/07/13 0700  Gross per 24 hour  Intake 1722.5 ml  Output      0 ml  Net 1722.5 ml   Filed Weights   09/06/13 0400 09/06/13 0948 09/06/13 0951  Weight: 116.7 kg (257 lb 4.4 oz) 115.078 kg (253 lb 11.2 oz) 115.078 kg (253 lb 11.2 oz)     Exam:  General exam: Pleasant young female sitting up comfortably in bed. Moderately built and morbidly obese. Respiratory system: Clear. No increased work of breathing. No pleural rub appreciated. No focal tenderness. Cardiovascular system: S1 & S2 heard, RRR. No JVD, murmurs, gallops, clicks or pedal edema. Telemetry: Sinus bradycardia in the 40s-50s. Gastrointestinal system: Abdomen is nondistended, soft and nontender. Normal bowel sounds heard. Central nervous system: Alert and oriented. No focal neurological deficits. Extremities: Symmetric 5 x 5 power.   Data Reviewed: Basic Metabolic Panel:  Recent Labs Lab 09/04/13 1017 09/05/13 1733  NA 141  --   K 4.0  --   CL 104  --   CO2 25  --   GLUCOSE 103*  --   BUN 13  --   CREATININE 0.65  --   CALCIUM 9.3  --   MG  --  1.7   Liver Function Tests:  Recent Labs Lab 09/04/13 1017  AST 47*  ALT 41*  ALKPHOS 55  BILITOT 0.3  PROT 7.0  ALBUMIN 3.7   No results found for this basename: LIPASE, AMYLASE,  in the last 168 hours No results found for this basename: AMMONIA,  in the last  168 hours CBC:  Recent Labs Lab 09/04/13 1017 09/05/13 0330 09/06/13 0507 09/07/13 0516  WBC 4.4 5.2 5.0 5.1  HGB 11.2* 10.3* 10.6* 11.8*  HCT 34.7* 30.7* 31.7* 36.3  MCV 85.3 84.3 84.3 84.4  PLT 116* 111* 122* 147*   Cardiac Enzymes:  Recent Labs Lab 09/04/13 1017 09/05/13 1724 09/05/13 2305 09/06/13 0507  TROPONINI <0.30 <0.30 <0.30 <0.30   BNP (last 3 results) No results found for this basename: PROBNP,  in the last 8760 hours CBG: No results found for this basename: GLUCAP,  in the last 168 hours  Recent Results (from the past 240 hour(s))  MRSA PCR SCREENING     Status: None   Collection Time    09/04/13  7:13 PM      Result Value Ref Range Status   MRSA by PCR NEGATIVE  NEGATIVE Final   Comment:            The GeneXpert MRSA Assay (FDA     approved for NASAL specimens     only), is one component of a     comprehensive MRSA colonization     surveillance program. It is not  intended to diagnose MRSA     infection nor to guide or     monitor treatment for     MRSA infections.      Additional labs: 1. Vitamin B12: 174, folate: 6.3, homocysteine: 8.5, antithrombin 3:83%, protein C activity: 152, protein S. activity: 76     Studies: No results found.      Scheduled Meds: . famotidine  20 mg Oral BID  . sodium chloride  3 mL Intravenous Q12H  . cyanocobalamin  2,000 mcg Oral Daily   Continuous Infusions: . sodium chloride 50 mL/hr at 09/06/13 2222  . heparin 1,250 Units/hr (09/06/13 2056)    Principal Problem:   Acute pulmonary embolism-submassive with right heart strain Active Problems:   HYPERLIPIDEMIA   ANEMIA-NOS   Headache(784.0)   Obesity   Acute deep vein thrombosis (DVT) of other specified vein of right lower extremity   Thrombocytopenia   Syncope and collapse    Time spent: 30 minutes.    Marcellus Scott, MD, FACP, FHM. [facility] Pager [phone]  If 7PM-7AM, please contact  night-coverage [email or web address] Password [email or web address] 09/07/2013, 9:09 AM    LOS: 3 days

## 2013-09-07 NOTE — Progress Notes (Signed)
ANTICOAGULATION CONSULT NOTE - Follow Up Consult  Pharmacy Consult for IV Heparin Indication: RLE DVT and acute pulmonary embolism  Allergies  Allergen Reactions  . Oxaprozin     angioedema  . Sulfonamide Derivatives     REACTION: hives  . Orange Juice [Orange Oil] Hives    Orange juice  . Tomato Hives    tomatoes  . Nsaids Rash    Patient Measurements: Height:  (170.2 cm) Weight: 253 lb 11.2 oz (115.078 kg) IBW/kg (Calculated) : 61.6 Heparin Dosing Weight: 88 kg  Vital Signs: Temp: 98.1 F (36.7 C) (09/06 0501) Temp src: Oral (09/06 0501) BP: 127/97 mmHg (09/06 0501) Pulse Rate: 52 (09/06 0501)  Labs:  Recent Labs  09/04/13 1017 09/04/13 1441  09/05/13 0330 09/05/13 1724 09/05/13 2305 09/06/13 0507  09/06/13 1545 09/06/13 2150 09/07/13 0516  HGB 11.2*  --   --  10.3*  --   --  10.6*  --   --   --  11.8*  HCT 34.7*  --   --  30.7*  --   --  31.7*  --   --   --  36.3  PLT 116*  --   --  111*  --   --  122*  --   --   --  147*  APTT  --  35  --   --   --   --   --   --   --   --   --   LABPROT  --  13.9  --   --   --   --   --   --   --   --   --   INR  --  1.07  --   --   --   --   --   --   --   --   --   HEPARINUNFRC  --   --   < > 0.69  --   --  1.10*  < > 0.62 0.57 0.57  CREATININE 0.65  --   --   --   --   --   --   --   --   --   --   TROPONINI <0.30  --   --   --  <0.30 <0.30 <0.30  --   --   --   --   < > = values in this interval not displayed.  Estimated Creatinine Clearance: 120 ml/min (by C-G formula based on Cr of 0.65).   Medications:  Scheduled:  . famotidine  20 mg Oral BID  . sodium chloride  3 mL Intravenous Q12H  . cyanocobalamin  2,000 mcg Oral Daily   Infusions:  . sodium chloride 50 mL/hr at 09/06/13 2222  . heparin 1,250 Units/hr (09/06/13 2056)   PRN: acetaminophen, acetaminophen, albuterol, hydrOXYzine, ondansetron  Assessment: 43 year-old female with extensive RLE DVT and acute submassive pulmonary embolism with  right heart strain. Provoked event secondary to Mirena IUD versus familial etiology. Will need anticoagulation for at least 3-6 months and longer if hypercoagulable etiology found. Baseline PT/INR and aPTT are within normal limits.  Factors which may increase the risk for bleeding: thrombocytopenia  CT head report states no evidence of acute abnormality, including hemorrhage.  Significant Events:  9/3: Heparin 2500 units as bolus dose, then infusion at 1500 units/hr. 2246 HL = 0.55  9/4: HL = 0.69, continue infusion @ 1500 units/hr  9/5: 0800 HL = 1.1 SUPRAtherapeutic on  Heparin IV @ 1500 units/hr Hgb, plt low but stable. Infusion held x 1 hour, then restarted @ 1250 units/hr. 1545 HL = 0.62 Therapeutic on Heparin IV @ 1250 units/hr  Today: 9/6 Level remains therapeutic at 0.57 on 1250 units/hr. H/H stable, Plts low but improved. No bleeding reported. MD plans noted to transition to Xarelto, likely today.   Goal of Therapy:  Heparin level 0.3-0.7 units/ml Monitor platelets by anticoagulation protocol: Yes   Plan:   Continue heparin at 1250 units/hr  Daily heparin level and CBC if not already changed to Xarelto  Loralee Pacas, PharmD, BCPS Pager: [phone] 09/07/2013,7:49 AM

## 2013-09-08 LAB — CBC
HCT: 36.1 % (ref 36.0–46.0)
HEMOGLOBIN: 11.7 g/dL — AB (ref 12.0–15.0)
MCH: 27.5 pg (ref 26.0–34.0)
MCHC: 32.4 g/dL (ref 30.0–36.0)
MCV: 84.7 fL (ref 78.0–100.0)
PLATELETS: 158 10*3/uL (ref 150–400)
RBC: 4.26 MIL/uL (ref 3.87–5.11)
RDW: 14.8 % (ref 11.5–15.5)
WBC: 5.1 10*3/uL (ref 4.0–10.5)

## 2013-09-08 LAB — HEPARIN LEVEL (UNFRACTIONATED): HEPARIN UNFRACTIONATED: 0.53 [IU]/mL (ref 0.30–0.70)

## 2013-09-08 MED ORDER — RIVAROXABAN (XARELTO) VTE STARTER PACK (15 & 20 MG): ORAL_TABLET | ORAL | Status: DC

## 2013-09-08 MED ORDER — ACETAMINOPHEN 325 MG PO TABS: 650.0000 mg | ORAL_TABLET | Freq: Four times a day (QID) | ORAL | Status: DC | PRN

## 2013-09-08 MED ORDER — RIVAROXABAN 20 MG PO TABS: 20.0000 mg | ORAL_TABLET | Freq: Every day | ORAL | Status: DC

## 2013-09-08 MED ORDER — RIVAROXABAN 15 MG PO TABS
15.0000 mg | ORAL_TABLET | Freq: Two times a day (BID) | ORAL | Status: DC
  Administered 2013-09-08 (×2): 15 mg via ORAL
  Filled 2013-09-08 (×3): qty 1

## 2013-09-08 NOTE — Progress Notes (Signed)
Pt leaving at this time with her "friends" at her side. Alert, oriented, and without c/o. Discharge instructions given/received with pt verbalizing understanding. Followup appointments noted. Evening Xarelto dose given before d/c. 30 day free Xarelto card given and pt knows to p/u Xarelto at [facility]. Thigh-high TED hose placed.

## 2013-09-08 NOTE — Progress Notes (Signed)
ANTICOAGULATION CONSULT NOTE - Follow Up Consult  Pharmacy Consult for IV Heparin Indication: RLE DVT and acute pulmonary embolism  Allergies  Allergen Reactions  . Oxaprozin     angioedema  . Sulfonamide Derivatives     REACTION: hives  . Orange Juice [Orange Oil] Hives    Orange juice  . Tomato Hives    tomatoes  . Nsaids Rash    Patient Measurements: Height:  (170.2 cm) Weight: 253 lb 11.2 oz (115.078 kg) IBW/kg (Calculated) : 61.6 Heparin Dosing Weight: 88 kg  Vital Signs: Temp: 98 F (36.7 C) (09/07 0451) Temp src: Oral (09/07 0451) BP: 109/66 mmHg (09/07 0451) Pulse Rate: 45 (09/07 0451)  Labs:  Recent Labs  09/05/13 1724 09/05/13 2305  09/06/13 0507  09/06/13 2150 09/07/13 0516 09/08/13 0425  HGB  --   --   < > 10.6*  --   --  11.8* 11.7*  HCT  --   --   --  31.7*  --   --  36.3 36.1  PLT  --   --   --  122*  --   --  147* 158  HEPARINUNFRC  --   --   --  1.10*  < > 0.57 0.57 0.53  TROPONINI <0.30 <0.30  --  <0.30  --   --   --   --   < > = values in this interval not displayed.  Estimated Creatinine Clearance: 120 ml/min (by C-G formula based on Cr of 0.65).   Medications:  Scheduled:  . famotidine  20 mg Oral BID  . sodium chloride  3 mL Intravenous Q12H  . cyanocobalamin  2,000 mcg Oral Daily   Infusions:  . heparin 1,250 Units/hr (09/07/13 1848)   PRN: acetaminophen, acetaminophen, albuterol, diphenhydrAMINE, ondansetron  Assessment: 43 year-old female with extensive RLE DVT and acute submassive pulmonary embolism with right heart strain. Provoked event secondary to Mirena IUD versus familial etiology. Will need anticoagulation for at least 3-6 months and longer if hypercoagulable etiology found. Baseline PT/INR and aPTT are within normal limits.  Factors which may increase the risk for bleeding: thrombocytopenia  CT head report states no evidence of acute abnormality, including hemorrhage.  Significant Events:  9/3: Heparin 2500  units as bolus dose, then infusion at 1500 units/hr. 2246 HL = 0.55  9/4: HL = 0.69, continue infusion @ 1500 units/hr  9/5: 0800 HL = 1.1 SUPRAtherapeutic on Heparin IV @ 1500 units/hr Hgb, plt low but stable. Infusion held x 1 hour, then restarted @ 1250 units/hr. 1545 HL = 0.62 Therapeutic on Heparin IV @ 1250 units/hr  Today: 9/7 Level remains therapeutic at 0.53 on 1250 units/hr. H/H stable, Plts low but improved. No bleeding reported. MD plans noted to transition to Xarelto, likely today.   Goal of Therapy:  Heparin level 0.3-0.7 units/ml Monitor platelets by anticoagulation protocol: Yes   Plan:   Continue heparin at 1250 units/hr  Daily heparin level and CBC if not already changed to Xarelto  Clance Boll, PharmD, BCPS Pager: [phone] 09/08/2013 7:07 AM

## 2013-09-08 NOTE — Discharge Summary (Signed)
Physician Discharge Summary  Hannah Thompson:[id number] DOB: 09-14-1970 DOA: 09/04/2013  PCP: Oliver Barre, MD  Admit date: 09/04/2013 Discharge date: 09/08/2013  Time spent: Greater than 30 minutes  Recommendations for Outpatient Follow-up:  1. Dr. Oliver Barre, PCP in 3 days. Please follow outstanding results of hypercoagulable panel sent from the hospital. 2. Dr. Arlan Organ, Hematology re work up for hypercoagulable state. 3. Dr. Tracey Harries, GYN re consideration for removal of IUD and timing of same. 4. Hematology recommends repeating CTA chest and right lower extremity venous Doppler in about 2 months. 5. Recommend repeating 2-D echo in one month to reassess right ventricular function.  Discharge Diagnoses:  Principal Problem:   Acute pulmonary embolism-submassive with right heart strain Active Problems:   HYPERLIPIDEMIA   ANEMIA-NOS   Headache(784.0)   Obesity   Acute deep vein thrombosis (DVT) of other specified vein of right lower extremity   Thrombocytopenia   Syncope and collapse   Discharge Condition: Improved & Stable  Diet recommendation: Heart healthy diet.  Filed Weights   09/06/13 0400 09/06/13 0948 09/06/13 0951  Weight: 116.7 kg (257 lb 4.4 oz) 115.078 kg (253 lb 11.2 oz) 115.078 kg (253 lb 11.2 oz)    History of present illness:  43 y.o. female with history of vitamin B 12 deficiency, HLD, anemia, recent demise of a young brother in the ED from cardiac arrest of unknown etiology (referred to medical examiner), no prior history of VTE, presented to the ED with DOE, palpitations, dizziness, lightheadedness and an episode of syncope. Further workup in the ED revealed right lower extremity DVT and CT angiogram of the chest showed Submassive pulmonary embolism with evidence of right heart strain. He was admitted to step down unit and started on IV heparin drip.  Hospital Course:   1. Acute submassive pulmonary embolism with right heart strain/right lower  extremity DVT: Hemodynamically stable. Admitted to step down unit for close monitoring and management. Hypercoagulable panel drawn-of results that are back-unremarkable . Started on IV heparin per pharmacy. IV fluid hydration to keep up preload. 2-D echo: Mild LVH, LVEF 55-60%, systolic flattening of ventricular septum, RV dilatation and moderately reduced systolic function.? Provoked event secondary to Mirena IUD versus familial etiology. Will need anticoagulation for at least 3-6 months and longer if hypercoagulable etiology found. Hematology input appreciated-recommend continued IV heparin for total 4 days (completed) and transitioned to PO Xarelto on 9/7. Intermittent fleeting chest discomfort seems muscular/pleuritic that could be from PE -resolved CP. Although EKG had new changes, troponin x3 negative. CXR without acute findings. Patient was provided with resources to get first month supply of Xarelto free and first dose was initiated in the hospital. Advised her to followup with OB/GYN outpatient regarding removal of IUD. 2. Syncope and collapse: Likely secondary to problem #1. 2-D echo results as above. Telemetry shows sinus bradycardia in the 40-50s-likely physiological and asymptomatic. TSH normal. No reported dizziness or lightheadedness with activity. 3. Normocytic anemia: Chronic. Slight drop in hemoglobin without reported bleeding. Stable 4. Thrombocytopenia: Seems to have had it intermittently. No bleeding reported. Resolved. 5. Headache: No focal deficits. States that she had the headache since her brother's demise 2 weeks ago. Unable to say if she hit her head during the syncopal episode. CT head without acute findings. No further headaches reported. 6. History of hyperlipidemia: Not on medications at home. 7. Morbid obesity: 8. Brothers demise recently: Etiology not known. It would be interesting to know his cause of death to see if he  may have had a PE. Patient states that she got a call  9/3 stating that cause of his demise may not be known for another 4-6 weeks. 9. B12 deficiency: B12 level 174 in March 2015. May consider repeating and adjusting dose as outpatient. 10. Asymptomatic sinus bradycardia: Patient noted to have sinus bradycardia mostly in the 50s but at times drops to high 30s and 40s especially during sleep. These are asymptomatic. TSH normal. May consider outpatient sleep study.   Consultations:  Hematology  Procedures:  2 D Echo 09/04/2013: Study Conclusions  - Left ventricle: The cavity size was normal. Wall thickness was increased in a pattern of mild LVH. Systolic function was normal. The estimated ejection fraction was in the range of 55% to 60%. Wall motion was normal; there were no regional wall motion abnormalities. - Ventricular septum: The contour showed systolic flattening. - Right ventricle: The cavity size was dilated. Wall thickness was normal. Systolic function was moderately reduced. - Right atrium: The atrium was mildly dilated. - Pulmonary arteries: No TR jet seen to measure pulmonary pressure by , so cannot estimate pulmonary artery pressures.    Bilateral lower extremity venous Dopplers 09/04/13: Summary:  - No evidence of deep vein thrombosis involving the left lower extremity. - Findings consistent with deep vein thrombosis involving the right popliteal vein, right posterial tibial veins, and right peroneal veins.     Discharge Exam:  Complaints: Denies chest pain or dyspnea. Area of swelling and soreness over her right forearm consistent with IV infiltration. States that she is being seen outpatient for chronic right groin pain but no acute worsening. No dizziness or lightheadedness.  Filed Vitals:   09/07/13 0501 09/07/13 1355 09/07/13 2148 09/08/13 0451  BP: 127/97 119/75 129/91 109/66  Pulse: 52 47 52 45  Temp: 98.1 F (36.7 C) 98.1 F (36.7 C) 97.6 F (36.4 C) 98 F (36.7 C)  TempSrc: Oral Oral Oral Oral  Resp:  Height:      Weight:      SpO2: 100% 99% 99% 98%   General exam: Pleasant young female sitting up comfortably in bed. Moderately built and morbidly obese.  Respiratory system: Clear. No increased work of breathing. No pleural rub appreciated. No focal tenderness.  Cardiovascular system: S1 & S2 heard, RRR. No JVD, murmurs, gallops, clicks or pedal edema. Telemetry: sinus bradycardia mostly in the 50s but at times drops to high 30s and 40s especially during sleep. Gastrointestinal system: Abdomen is nondistended, soft and nontender. Normal bowel sounds heard.  Central nervous system: Alert and oriented. No focal neurological deficits.  Extremities: Symmetric 5 x 5 power.   Discharge Instructions      Discharge Instructions   Call MD for:  difficulty breathing, headache or visual disturbances    Complete by:  As directed      Call MD for:  severe uncontrolled pain    Complete by:  As directed      Call MD for:    Complete by:  As directed   Passing out.     Diet - low sodium heart healthy    Complete by:  As directed      Increase activity slowly    Complete by:  As directed             Medication List         acetaminophen 325 MG tablet  Commonly known as:  TYLENOL  Take 2 tablets (650 mg total) by mouth  every 6 (six) hours as needed for mild pain, moderate pain or headache (or Fever >/= 101).     cyanocobalamin 2000 MCG tablet  Take 2,000 mcg by mouth daily.     EPINEPHrine 0.3 mg/0.3 mL Devi  Commonly known as:  EPI-PEN  Inject 0.3 mg into the muscle daily as needed (Anaphylaxis).     hydrOXYzine 25 MG tablet  Commonly known as:  ATARAX/VISTARIL  Take 25 mg by mouth 3 (three) times daily as needed for anxiety or itching.     ranitidine 150 MG tablet  Commonly known as:  ZANTAC  Take 150 mg by mouth 2 (two) times daily.     Rivaroxaban 15 & 20 MG Tbpk  Commonly known as:  XARELTO STARTER PACK  Take as directed on package: Start with one   tablet by mouth twice a day with food. On Day 22, switch to one  tablet once a day with food.       Follow-up Information   Follow up with Oliver Barre, MD. Schedule an appointment as soon as possible for a visit in 3 days.   Specialties:  Internal Medicine, Radiology   Contact information:   [address] Maggie Schwalbe [facility] [place] [address] [phone]       Schedule an appointment as soon as possible for a visit with Josph Macho, MD.   Specialty:  Oncology   Contact information:   [address] [place] Stalls [place] [address] [phone]       Schedule an appointment as soon as possible for a visit with Bing Plume, MD. (re consideration of removal of IUD and timing of same.)    Specialty:  Obstetrics and Gynecology   Contact information:   [address], [address] [place] [address] [phone]       The results of significant diagnostics from this hospitalization (including imaging, microbiology, ancillary and laboratory) are listed below for reference.    Significant Diagnostic Studies: Dg Chest 2 View  09/07/2013   CLINICAL DATA:  Acute pulmonary embolism.  EXAM: CHEST  2 VIEW  COMPARISON:  CT of 10/01/2013  FINDINGS: Midline trachea. Mild cardiomegaly. Mediastinal contours otherwise within normal limits. No pleural effusion or pneumothorax. Clear lungs.  IMPRESSION: Cardiomegaly, without acute disease.   Electronically Signed   By: Jeronimo Greaves M.D.   On: 09/07/2013 09:51   Ct Head Wo Contrast  09/04/2013   CLINICAL DATA:  Syncopal episode  EXAM: CT HEAD WITHOUT CONTRAST  TECHNIQUE: Contiguous axial images were obtained from the base of the skull through the vertex without intravenous contrast.  COMPARISON:  None.  FINDINGS: Skull and Sinuses:Negative for fracture or destructive process. The mastoids, middle ears, and imaged paranasal sinuses are clear.  Orbits: No acute abnormality.  Brain: No evidence of acute abnormality, such as acute infarction,  hemorrhage, hydrocephalus, or mass lesion/mass effect. Mild cerebellar tonsillar ectopia. No foramen magnum crowding. Question small white matter low-attenuation focus subinsular on the right. This usually represents a remote small vessel insult, traumatic injury, or demyelination focus. Cavum septum pellucidum et vergae.  IMPRESSION: No acute intracranial findings.   Electronically Signed   By: Tiburcio Pea M.D.   On: 09/04/2013 16:30   Ct Angio Chest Pe W/cm &/or Wo Cm  09/04/2013   CLINICAL DATA:  Syncopal episode with chest pain and known history of deep venous thrombosis.  EXAM: CT ANGIOGRAPHY CHEST WITH CONTRAST  TECHNIQUE: Multidetector CT imaging of the chest was performed using the  standard protocol during bolus administration of intravenous contrast. Multiplanar CT image reconstructions and MIPs were obtained to evaluate the vascular anatomy.  CONTRAST:  OMNIPAQUE IOHEXOL 350 MG/ML SOLN  COMPARISON:  None.  FINDINGS: The lungs are well-aerated without focal infiltrate or sizable effusion. No pneumothorax or parenchymal nodule is seen. No significant hilar or mediastinal adenopathy is noted.  The pulmonary artery shows significant bilateral pulmonary emboli predominately on the right and to a lesser degree on the left. The RV the LV ratios 1.1 for which is consistent with sub massive pulmonary embolism and right heart strain. The thoracic aorta is within normal limits. No other focal vascular abnormality is seen.  Scanning into the upper abdomen reveals no acute abnormality. The osseous structures are within normal limits.  Review of the MIP images confirms the above findings.  IMPRESSION: Positive for acute PE with CT evidence of right heart strain (RV/LV Ratio = 1.14) consistent with at least submassive (intermediate risk)PE. The presence of right heart strain has been associated with an increased risk of morbidity and mortality. Consultation with Pulmonary and Critical Care Medicine is  recommended.  No other focal abnormality is noted.  Critical Value/emergent results were called by telephone at the time of interpretation on 09/04/2013 at 2:29 pm to Dr. Derwood Kaplan , who verbally acknowledged these results.   Electronically Signed   By: Alcide Clever M.D.   On: 09/04/2013 14:29    Microbiology: Recent Results (from the past 240 hour(s))  MRSA PCR SCREENING     Status: None   Collection Time    09/04/13  7:13 PM      Result Value Ref Range Status   MRSA by PCR NEGATIVE  NEGATIVE Final   Comment:            The GeneXpert MRSA Assay (FDA     approved for NASAL specimens     only), is one component of a     comprehensive MRSA colonization     surveillance program. It is not     intended to diagnose MRSA     infection nor to guide or     monitor treatment for     MRSA infections.     Labs: Basic Metabolic Panel:  Recent Labs Lab 09/04/13 1017 09/05/13 1733  NA 141  --   K 4.0  --   CL 104  --   CO2 25  --   GLUCOSE 103*  --   BUN 13  --   CREATININE 0.65  --   CALCIUM 9.3  --   MG  --  1.7   Liver Function Tests:  Recent Labs Lab 09/04/13 1017  AST 47*  ALT 41*  ALKPHOS 55  BILITOT 0.3  PROT 7.0  ALBUMIN 3.7   No results found for this basename: LIPASE, AMYLASE,  in the last 168 hours No results found for this basename: AMMONIA,  in the last 168 hours CBC:  Recent Labs Lab 09/04/13 1017 09/05/13 0330 09/06/13 0507 09/07/13 0516 09/08/13 0425  WBC 4.4 5.2 5.0 5.1 5.1  HGB 11.2* 10.3* 10.6* 11.8* 11.7*  HCT 34.7* 30.7* 31.7* 36.3 36.1  MCV 85.3 84.3 84.3 84.4 84.7  PLT 116* 111* 122* 147* 158   Cardiac Enzymes:  Recent Labs Lab 09/04/13 1017 09/05/13 1724 09/05/13 2305 09/06/13 0507  TROPONINI <0.30 <0.30 <0.30 <0.30   BNP: BNP (last 3 results) No results found for this basename: PROBNP,  in the last 8760 hours CBG: No  results found for this basename: GLUCAP,  in the last 168 hours  Additional labs:  Homocysteine: 8.5,  antithrombin 3:83%, protein C activity: 152, protein S. activity: 76%  TSH: 3.350  Lupus anticoagulant: Not detected  Beta 2 glycoprotein antibodies: Negative  Factor V Leiden mutation: Not detected  Prothrombin gene mutation: Not detected  Cardiolipin antibodies: Negative    Signed:  ,, MD, FACP, FHM. [facility] Pager [phone]  If 7PM-7AM, please contact night-coverage [email or web address] Password [email or web address] 09/08/2013, 11:52 AM

## 2013-09-08 NOTE — Progress Notes (Signed)
Mrs. Kaylor is doing okay. She continues on heparin. She has been on for the weekend.  She can probably go on to oral therapy now. Xarelto would be fine. Hopefully, her insurance will cover it. Patient is a little bit of pain in the right leg. I would go ahead and put a compression stocking on that leg. I don't think there is any contraindication to have compression stocking.  She's had no bleeding. She is ambulating a little bit.  Her blood counts look pretty stable. Her heparin level has been therapeutic.  She had a chest x-ray yesterday. Everything looked okay. She has some slight cardiomegaly.  On her physical exam, all of her vital signs are stable. Her blood pressure is 109/66. Heart rate is 45. Temperature 98 degrees. Her lungs are clear. Cardiac exam regular in rhythm with no murmurs rubs or bruits. Abdomen is soft. She has good bowel sounds. There is no fluid wave. Extremities shows some mild nonpitting edema of the right thigh. No tenderness noted to palpation. She has good pulses.  Again, she is looking pretty good. She can go on to oral therapy now.  All of her thrombophilic studies are negative.  I probably would repeat a CT angiogram and Doppler of her right leg in about 2 months.  Hewitt Shorts

## 2013-09-08 NOTE — Care Management Note (Signed)
    Page 1 of 2   09/08/2013     11:44:28 AM CARE MANAGEMENT NOTE 09/08/2013  Patient:  CYRIL, RAILEY   Account Number:  [id number]  Date Initiated:  09/05/2013  Documentation initiated by:  DAVIS,RHONDA  Subjective/Objective Assessment:   patient passed out in the br at [facility] awoke in the tub and had urinated on the floor. ct scan confirmed:Positive for acute PE with CT evidence of right heart strain (RV/LV  Ratio = 1.14) consistent with at least submassive (intermediate  risk     Action/Plan:   tbd, home   Anticipated DC Date:  09/08/2013   Anticipated DC Plan:  HOME/SELF CARE  In-house referral  NA      DC Planning Services  CM consult  Medication Assistance      PAC Choice  NA   Choice offered to / List presented to:  NA   DME arranged  NA      DME agency  NA     [place] arranged  NA      [place] agency  NA   Status of service:  Completed, signed off Medicare Important Message given?   (If response is "NO", the following Medicare IM given date fields will be blank) Date Medicare IM given:   Medicare IM given by:   Date Additional Medicare IM given:   Additional Medicare IM given by:    Discharge Disposition:  HOME/SELF CARE  Per UR Regulation:  Reviewed for med. necessity/level of care/duration of stay  If discussed at [place] of Stay Meetings, dates discussed:    Comments:  09/08/13   RN,BSN NCM [phone] SPOKE TO PATIENT IN RM ABOUT D/C PLANS.HER PCP-DR. JAMES JOHN-SHE WILL F/U FOR APPT,& FURTHER MGMNT OF XARELTO & FURTHER ASSISTANCE WITH MEDS.[facility] [facility] MED,OPEN TODAY 10A-6P;CLOSE FOR LUNCH 1:30P-2:15P.PATIENT HAS [facility] ADDRESS &TEL#, & TIMES THEY ARE OPEN.PHARMACY PROVIDED PATIENT W/XARELTO STARTER KIT,& XARELTO 30DAY FREE TRIAL CARD.PATIENT VOICED UNDERSTANDING TO TAKE SCRIPT,& FREE TRIAL OFFER CARD TO PHARMACY @ D/C.NO FURTHER D/C NEEDS.MD/NURSE UPDATED.  09/07/2013 1145 Referral for Xarelto coverage post dc and if prior auth needed.  Will check benefits on 09/09/2013. Xarelto has a 30 day free trial card and $ 0- 10 copay card with medication for commercial payors. NCM will continue to follow up until for dc needs. Jonnie Finner RN CCM Case Mgmt phone [phone]  [facility]

## 2013-09-08 NOTE — Discharge Instructions (Signed)
Information on my medicine - XARELTO (rivaroxaban)  This medication education was reviewed with me or my healthcare representative as part of my discharge preparation.  The pharmacist that spoke with me during my hospital stay was:  Clance Boll, [facility]  WHY WAS XARELTO PRESCRIBED FOR YOU? Xarelto was prescribed to treat blood clots that may have been found in the veins of your legs (deep vein thrombosis) or in your lungs (pulmonary embolism) and to reduce the risk of them occurring again.  What do you need to know about Xarelto? The starting dose is one 15 mg tablet taken TWICE daily with food for the FIRST 21 DAYS then on (enter date)  09/29/2013  the dose is changed to one 20 mg tablet taken ONCE A DAY with your evening meal.  DO NOT stop taking Xarelto without talking to the health care provider who prescribed the medication.  Refill your prescription for 20 mg tablets before you run out.  After discharge, you should have regular check-up appointments with your healthcare provider that is prescribing your Xarelto.  In the future your dose may need to be changed if your kidney function changes by a significant amount.  What do you do if you miss a dose? If you are taking Xarelto TWICE DAILY and you miss a dose, take it as soon as you remember. You may take two 15 mg tablets (total 30 mg) at the same time then resume your regularly scheduled 15 mg twice daily the next day.  If you are taking Xarelto ONCE DAILY and you miss a dose, take it as soon as you remember on the same day then continue your regularly scheduled once daily regimen the next day. Do not take two doses of Xarelto at the same time.   Important Safety Information Xarelto is a blood thinner medicine that can cause bleeding. You should call your healthcare provider right away if you experience any of the following:   Bleeding from an injury or your nose that does not stop.   Unusual colored urine (red or dark brown) or  unusual colored stools (red or black).   Unusual bruising for unknown reasons.   A serious fall or if you hit your head (even if there is no bleeding).  Some medicines may interact with Xarelto and might increase your risk of bleeding while on Xarelto. To help avoid this, consult your healthcare provider or pharmacist prior to using any new prescription or non-prescription medications, including herbals, vitamins, non-steroidal anti-inflammatory drugs (NSAIDs) and supplements.  This website has more information on Xarelto: VisitDestination.com.br.

## 2013-09-08 NOTE — Progress Notes (Signed)
ANTICOAGULATION CONSULT NOTE - Initial Consult  Pharmacy Consult for Xarelto Indication: VTE treatment (Transition from Heparin)  Allergies  Allergen Reactions  . Oxaprozin     angioedema  . Sulfonamide Derivatives     REACTION: hives  . Orange Juice [Orange Oil] Hives    Orange juice  . Tomato Hives    tomatoes  . Nsaids Rash    Patient Measurements: Height:  (170.2 cm) Weight: 253 lb 11.2 oz (115.078 kg) IBW/kg (Calculated) : 61.6 Heparin Dosing Weight:   Vital Signs: Temp: 98 F (36.7 C) (09/07 0451) Temp src: Oral (09/07 0451) BP: 109/66 mmHg (09/07 0451) Pulse Rate: 45 (09/07 0451)  Labs:  Recent Labs  09/05/13 1724 09/05/13 2305  09/06/13 0507  09/06/13 2150 09/07/13 0516 09/08/13 0425  HGB  --   --   < > 10.6*  --   --  11.8* 11.7*  HCT  --   --   --  31.7*  --   --  36.3 36.1  PLT  --   --   --  122*  --   --  147* 158  HEPARINUNFRC  --   --   --  1.10*  < > 0.57 0.57 0.53  TROPONINI <0.30 <0.30  --  <0.30  --   --   --   --   < > = values in this interval not displayed.  Estimated Creatinine Clearance: 120 ml/min (by C-G formula based on Cr of 0.65).   Medical History: Past Medical History  Diagnosis Date  . Headache(784.0) 06/21/2009    recurrent  . VITAMIN B12 DEFICIENCY 03/18/2009  . ASTHMA 03/17/2009  . ALLERGIC RHINITIS 03/17/2009  . ECZEMA 03/17/2009  . DISC DISEASE, LUMBAR 03/17/2009    mild facet arthropathy by MRI 2005  . HYPERLIPIDEMIA 03/17/2009  . ANEMIA-NOS 03/17/2009  . KELOID 06/21/2009   Assessment: 43 year-old female with extensive RLE DVT and acute submassive pulmonary embolism with right heart strain. Provoked event secondary to Mirena IUD versus familial etiology. Will need anticoagulation for at least 3-6 months and longer if hypercoagulable etiology found.  Baseline PT/INR and aPTT are within normal limits.  Factors which may increase the risk for bleeding: thrombocytopenia  CT head report states no evidence of acute  abnormality, including hemorrhage.  Significant Events:  9/3: Heparin 2500 units as bolus dose, then infusion at 1500 units/hr. 2246 HL = 0.55  9/4: HL = 0.69, continue infusion @ 1500 units/hr  9/5: 0800 HL = 1.1 SUPRAtherapeutic on Heparin IV @ 1500 units/hr Hgb, plt low but stable. Infusion held x 1 hour, then restarted @ 1250 units/hr. 1545 HL = 0.62 Therapeutic on Heparin IV @ 1250 units/hr 9/7: Anticoagulation to transition to PO xarelto  Today: 9/7  Pharmacy consulted to begin xarelto treatment.  Daily heparin level was therapeutic.  Renal function WNL.  No issues with CBC noted.  No issues per RN.     Goal of Therapy:  Monitor platelets by anticoagulation protocol: Yes   Plan:  Stop heparin and begin xarelto  PO BID x 21 days, then  PO daily thereafter.  Pharmacy will provide xarelto teaching education.  F/u for s/sxs bleeding, CBC.    Haynes Hoehn, PharmD, BCPS 09/08/2013, 10:19 AM  Pager: [phone]

## 2013-09-09 ENCOUNTER — Encounter: Payer: Self-pay | Admitting: Internal Medicine

## 2013-09-09 ENCOUNTER — Ambulatory Visit (INDEPENDENT_AMBULATORY_CARE_PROVIDER_SITE_OTHER): Payer: No Typology Code available for payment source | Admitting: Internal Medicine

## 2013-09-09 VITALS — BP 120/68 | HR 55 | Temp 98.3°F | Wt 249.4 lb

## 2013-09-09 DIAGNOSIS — J45909 Unspecified asthma, uncomplicated: Secondary | ICD-10-CM

## 2013-09-09 DIAGNOSIS — I82409 Acute embolism and thrombosis of unspecified deep veins of unspecified lower extremity: Secondary | ICD-10-CM

## 2013-09-09 DIAGNOSIS — I82491 Acute embolism and thrombosis of other specified deep vein of right lower extremity: Secondary | ICD-10-CM

## 2013-09-09 DIAGNOSIS — I2699 Other pulmonary embolism without acute cor pulmonale: Secondary | ICD-10-CM

## 2013-09-09 LAB — PROTEIN C, TOTAL: Protein C, Total: 95 % (ref 72–160)

## 2013-09-09 LAB — PROTEIN S, TOTAL: Protein S Ag, Total: 81 % (ref 60–150)

## 2013-09-09 NOTE — Progress Notes (Signed)
Subjective:    Patient ID: Hannah Thompson, female    DOB: [birth year], 43 y.o.   MRN: [id number]  HPI  Here to f/u recent hospn for acute PE with right heart strain, non TPA, after syncope, DOE/palps/dizzy.  + for RLE DVT as well, tx with IV heparin, echo with right heart strain, now on xarelto plan for 3-6 mo.  Hypercoag eval begun.  Advised f/u with echo at one mo, CTA at 2 mo as well as Heme and GYN.  Needs back to work note.  Pt denies chest pain, increased sob or doe, wheezing, orthopnea, PND, increased LE swelling, palpitations, dizziness or syncope. Pt denies new neurological symptoms such as new headache, or facial or extremity weakness or numbness   Pt denies polydipsia, polyuria,  Past Medical History  Diagnosis Date  . Headache(784.0) 06/21/2009    recurrent  . VITAMIN B12 DEFICIENCY 03/18/2009  . ASTHMA 03/17/2009  . ALLERGIC RHINITIS 03/17/2009  . ECZEMA 03/17/2009  . DISC DISEASE, LUMBAR 03/17/2009    mild facet arthropathy by MRI 2005  . HYPERLIPIDEMIA 03/17/2009  . ANEMIA-NOS 03/17/2009  . KELOID 06/21/2009   Past Surgical History  Procedure Laterality Date  . Keloid excision  mid 1990's    s/p keloid removal-laser     reports that she has quit smoking. She has never used smokeless tobacco. She reports that she does not drink alcohol or use illicit drugs. family history includes Alcohol abuse in her other; Allergies in her sister; Cancer in her father, other, and paternal aunt; Deep vein thrombosis in her sister; Diabetes in her mother and other; Heart disease in her other; Hypertension in her father and mother. Allergies  Allergen Reactions  . Oxaprozin     angioedema  . Sulfonamide Derivatives     REACTION: hives  . Orange Juice [Orange Oil] Hives    Orange juice  . Tomato Hives    tomatoes  . Nsaids Rash   No current facility-administered medications on file prior to visit.   Current Outpatient Prescriptions on File Prior to Visit  Medication Sig Dispense Refill    . acetaminophen (TYLENOL) 325 MG tablet Take 2 tablets (650 mg total) by mouth every 6 (six) hours as needed for mild pain, moderate pain or headache (or Fever >/= 101).      . cyanocobalamin 2000 MCG tablet Take 2,000 mcg by mouth daily.      Thompson Kitchen EPINEPHrine (EPI-PEN) 0.3 mg/0.3 mL DEVI Inject 0.3 mg into the muscle daily as needed (Anaphylaxis).      . hydrOXYzine (ATARAX/VISTARIL) 25 MG tablet Take 25 mg by mouth 3 (three) times daily as needed for anxiety or itching.      . ranitidine (ZANTAC) 150 MG tablet Take 150 mg by mouth 2 (two) times daily.       Review of Systems  Constitutional: Negative for unusual diaphoresis or other sweats  HENT: Negative for ringing in ear Eyes: Negative for double vision or worsening visual disturbance.  Respiratory: Negative for choking and stridor.   Gastrointestinal: Negative for vomiting or other signifcant bowel change Genitourinary: Negative for hematuria or decreased urine volume.  Musculoskeletal: Negative for other MSK pain or swelling Skin: Negative for color change and worsening wound.  Neurological: Negative for tremors and numbness other than noted  Psychiatric/Behavioral: Negative for decreased concentration or agitation other than above       Objective:   Physical Exam BP 120/68  Pulse 55  Temp(Src) 98.3 F (36.8 C) (Oral)  Wt 249 lb 6 oz (113.116 kg)  SpO2 99% VS noted,  Constitutional: Pt appears well-developed, well-nourished.  HENT: Head: NCAT.  Right Ear: External ear normal.  Left Ear: External ear normal.  Eyes: . Pupils are equal, round, and reactive to light. Conjunctivae and EOM are normal Neck: Normal range of motion. Neck supple.  Cardiovascular: Normal rate and regular rhythm.   Pulmonary/Chest: Effort normal and breath sounds normal.  Neurological: Pt is alert. Not confused , motor grossly intact Skin: Skin is warm. No rash Psychiatric: Pt behavior is normal. No agitation.     Assessment & Plan:

## 2013-09-09 NOTE — Patient Instructions (Signed)
Please continue all other medications as before, and refills have been done if requested.  Please have the pharmacy call with any other refills you may need.  Please continue your efforts at being more active, low cholesterol diet, and weight control.  Please keep your appointments with your specialists as you may have planned  You will be contacted regarding the referral for: echocardiogram for approximately Oct 06, 2013  You are given the work note today

## 2013-09-09 NOTE — Progress Notes (Signed)
Pre visit review using our clinic review tool, if applicable. No additional management support is needed unless otherwise documented below in the visit note. 

## 2013-09-10 ENCOUNTER — Other Ambulatory Visit: Payer: Self-pay | Admitting: Hematology & Oncology

## 2013-09-10 DIAGNOSIS — I2699 Other pulmonary embolism without acute cor pulmonale: Secondary | ICD-10-CM

## 2013-09-11 ENCOUNTER — Telehealth: Payer: Self-pay | Admitting: *Deleted

## 2013-09-11 DIAGNOSIS — IMO0002 Reserved for concepts with insufficient information to code with codable children: Secondary | ICD-10-CM | POA: Insufficient documentation

## 2013-09-11 DIAGNOSIS — L299 Pruritus, unspecified: Secondary | ICD-10-CM | POA: Insufficient documentation

## 2013-09-11 DIAGNOSIS — T148XXS Other injury of unspecified body region, sequela: Secondary | ICD-10-CM | POA: Insufficient documentation

## 2013-09-11 NOTE — Telephone Encounter (Signed)
No, this would not normally be a concern. thanks

## 2013-09-11 NOTE — Telephone Encounter (Signed)
Pt states she saw md on Tues wanting to ask md is their a possibility that the NSAID that she was allergic too caused pulmonary embolism...Raechel Chute

## 2013-09-12 NOTE — Telephone Encounter (Signed)
Called pt no LMOM with md response...Raechel Chute

## 2013-09-14 ENCOUNTER — Emergency Department (HOSPITAL_COMMUNITY): Payer: No Typology Code available for payment source

## 2013-09-14 ENCOUNTER — Encounter (HOSPITAL_COMMUNITY): Payer: Self-pay | Admitting: Emergency Medicine

## 2013-09-14 ENCOUNTER — Emergency Department (HOSPITAL_COMMUNITY)
Admission: EM | Admit: 2013-09-14 | Discharge: 2013-09-14 | Disposition: A | Discharge status: Home / Self Care | Payer: No Typology Code available for payment source | Attending: Emergency Medicine | Admitting: Emergency Medicine

## 2013-09-14 DIAGNOSIS — Z3202 Encounter for pregnancy test, result negative: Secondary | ICD-10-CM | POA: Diagnosis not present

## 2013-09-14 DIAGNOSIS — R51 Headache: Secondary | ICD-10-CM | POA: Diagnosis not present

## 2013-09-14 DIAGNOSIS — IMO0002 Reserved for concepts with insufficient information to code with codable children: Secondary | ICD-10-CM | POA: Diagnosis not present

## 2013-09-14 DIAGNOSIS — Z7901 Long term (current) use of anticoagulants: Secondary | ICD-10-CM | POA: Diagnosis not present

## 2013-09-14 DIAGNOSIS — D649 Anemia, unspecified: Secondary | ICD-10-CM | POA: Insufficient documentation

## 2013-09-14 DIAGNOSIS — E538 Deficiency of other specified B group vitamins: Secondary | ICD-10-CM | POA: Diagnosis not present

## 2013-09-14 DIAGNOSIS — R0789 Other chest pain: Secondary | ICD-10-CM | POA: Diagnosis not present

## 2013-09-14 DIAGNOSIS — R42 Dizziness and giddiness: Secondary | ICD-10-CM | POA: Insufficient documentation

## 2013-09-14 DIAGNOSIS — Z8739 Personal history of other diseases of the musculoskeletal system and connective tissue: Secondary | ICD-10-CM | POA: Insufficient documentation

## 2013-09-14 DIAGNOSIS — R519 Headache, unspecified: Secondary | ICD-10-CM

## 2013-09-14 DIAGNOSIS — Z872 Personal history of diseases of the skin and subcutaneous tissue: Secondary | ICD-10-CM | POA: Diagnosis not present

## 2013-09-14 DIAGNOSIS — Z87891 Personal history of nicotine dependence: Secondary | ICD-10-CM | POA: Diagnosis not present

## 2013-09-14 DIAGNOSIS — J45909 Unspecified asthma, uncomplicated: Secondary | ICD-10-CM | POA: Diagnosis not present

## 2013-09-14 DIAGNOSIS — Z79899 Other long term (current) drug therapy: Secondary | ICD-10-CM | POA: Insufficient documentation

## 2013-09-14 LAB — COMPREHENSIVE METABOLIC PANEL
ALK PHOS: 61 U/L (ref 39–117)
ALT: 38 U/L — ABNORMAL HIGH (ref 0–35)
ANION GAP: 13 (ref 5–15)
AST: 20 U/L (ref 0–37)
Albumin: 3.5 g/dL (ref 3.5–5.2)
BUN: 13 mg/dL (ref 6–23)
CHLORIDE: 102 meq/L (ref 96–112)
CO2: 25 mEq/L (ref 19–32)
Calcium: 9.2 mg/dL (ref 8.4–10.5)
Creatinine, Ser: 0.71 mg/dL (ref 0.50–1.10)
GLUCOSE: 80 mg/dL (ref 70–99)
Potassium: 3.9 mEq/L (ref 3.7–5.3)
Sodium: 140 mEq/L (ref 137–147)
Total Protein: 7.1 g/dL (ref 6.0–8.3)

## 2013-09-14 LAB — CBC WITH DIFFERENTIAL/PLATELET
Basophils Absolute: 0 10*3/uL (ref 0.0–0.1)
Basophils Relative: 0 % (ref 0–1)
Eosinophils Absolute: 0.2 10*3/uL (ref 0.0–0.7)
Eosinophils Relative: 4 % (ref 0–5)
HEMATOCRIT: 34.9 % — AB (ref 36.0–46.0)
HEMOGLOBIN: 11.4 g/dL — AB (ref 12.0–15.0)
LYMPHS ABS: 2.3 10*3/uL (ref 0.7–4.0)
Lymphocytes Relative: 40 % (ref 12–46)
MCH: 28.1 pg (ref 26.0–34.0)
MCHC: 32.7 g/dL (ref 30.0–36.0)
MCV: 86.2 fL (ref 78.0–100.0)
MONOS PCT: 6 % (ref 3–12)
Monocytes Absolute: 0.4 10*3/uL (ref 0.1–1.0)
NEUTROS ABS: 2.8 10*3/uL (ref 1.7–7.7)
Neutrophils Relative %: 50 % (ref 43–77)
Platelets: 179 10*3/uL (ref 150–400)
RBC: 4.05 MIL/uL (ref 3.87–5.11)
RDW: 14.9 % (ref 11.5–15.5)
WBC: 5.6 10*3/uL (ref 4.0–10.5)

## 2013-09-14 MED ORDER — DIPHENHYDRAMINE HCL 50 MG/ML IJ SOLN
25.0000 mg | Freq: Once | INTRAMUSCULAR | Status: AC
  Administered 2013-09-14: 25 mg via INTRAVENOUS
  Filled 2013-09-14: qty 1

## 2013-09-14 MED ORDER — METOCLOPRAMIDE HCL 5 MG/ML IJ SOLN
10.0000 mg | Freq: Once | INTRAMUSCULAR | Status: AC
  Administered 2013-09-14: 10 mg via INTRAVENOUS
  Filled 2013-09-14: qty 2

## 2013-09-14 MED ORDER — SODIUM CHLORIDE 0.9 % IV BOLUS (SEPSIS)
1000.0000 mL | Freq: Once | INTRAVENOUS | Status: AC
  Administered 2013-09-14: 1000 mL via INTRAVENOUS

## 2013-09-14 NOTE — Discharge Instructions (Signed)

## 2013-09-14 NOTE — Assessment & Plan Note (Signed)
Cont xarelto, for f/u echo at 1 mo

## 2013-09-14 NOTE — Assessment & Plan Note (Signed)
Also for f/u cta chest 2 mo,  to f/u any worsening symptoms or concerns

## 2013-09-14 NOTE — ED Notes (Addendum)
Patient presents for mid sternal CP with dizziness and nausea x1 day. Patient was recently admitted and d/c with PE and DVT. Patient described pain as dull with intermittent sharpness, non radiating. Patient denies SOB at this time.

## 2013-09-14 NOTE — Assessment & Plan Note (Signed)
stable overall by history and exam, recent data reviewed with pt, and pt to continue medical treatment as before,  to f/u any worsening symptoms or concerns SpO2 Readings from Last 3 Encounters:  09/14/13 100%  09/09/13 99%  09/08/13 98%

## 2013-09-14 NOTE — ED Provider Notes (Signed)
CSN: [id number]     Arrival date & time 09/14/13  1728 History   First MD Initiated Contact with Patient 09/14/13 1738     Chief Complaint  Patient presents with  . Chest Pain  . Nausea  . Dizziness     (Consider location/radiation/quality/duration/timing/severity/associated sxs/prior Treatment) Patient is a 43 y.o. female presenting with dizziness.  Dizziness Quality:  Head spinning and lightheadedness Severity:  Moderate Onset quality:  Gradual Duration:  1 day Timing:  Constant Progression:  Worsening Chronicity:  New Context: head movement and standing up   Context: not with bowel movement and not with loss of consciousness   Relieved by:  Nothing Worsened by:  Nothing tried Ineffective treatments:  None tried Associated symptoms: no blood in stool, no diarrhea, no nausea, no shortness of breath and no vomiting   Risk factors comment:  Recently dx with PE and DVT on xarelto   Past Medical History  Diagnosis Date  . Headache(784.0) 06/21/2009    recurrent  . VITAMIN B12 DEFICIENCY 03/18/2009  . ASTHMA 03/17/2009  . ALLERGIC RHINITIS 03/17/2009  . ECZEMA 03/17/2009  . DISC DISEASE, LUMBAR 03/17/2009    mild facet arthropathy by MRI 2005  . HYPERLIPIDEMIA 03/17/2009  . ANEMIA-NOS 03/17/2009  . KELOID 06/21/2009   Past Surgical History  Procedure Laterality Date  . Keloid excision  mid 1990's    s/p keloid removal-laser    Family History  Problem Relation Age of Onset  . Cancer Father     Prostate Cancer  . Hypertension Father   . Allergies Sister   . Cancer Paternal Aunt     Ovarian Cancer  . Cancer Other     Grandparent-Lung Cancer  . Diabetes Other     Grandparent  . Heart disease Other     Grandparent  . Alcohol abuse Other     Several on both sides of family-3 uncles  . Hypertension Mother   . Diabetes Mother   . Deep vein thrombosis Sister    History  Substance Use Topics  . Smoking status: Former Games developer  . Smokeless tobacco: Never Used  . Alcohol  Use: No     Comment: socially   OB History   Grav Para Term Preterm Abortions TAB SAB Ect Mult Living   Review of Systems  Respiratory: Negative for shortness of breath.   Gastrointestinal: Negative for nausea, vomiting, diarrhea and blood in stool.  Neurological: Positive for dizziness.  All other systems reviewed and are negative.     Allergies  Oxaprozin; Sulfonamide derivatives; Orange juice; Tomato; and Nsaids  Home Medications   Prior to Admission medications   Medication Sig Start Date End Date Taking? Authorizing Provider  acetaminophen (TYLENOL) 325 MG tablet Take 2 tablets (650 mg total) by mouth every 6 (six) hours as needed for mild pain, moderate pain or headache (or Fever >/= 101). 09/08/13  Yes Elease Etienne, MD  cyanocobalamin 2000 MCG tablet Take 2,000 mcg by mouth daily.   Yes Historical Provider, MD  desonide (DESOWEN) 0.05 % ointment Apply 1 application topically 2 (two) times daily.  08/11/13  Yes Historical Provider, MD  EPINEPHrine (EPI-PEN) 0.3 mg/0.3 mL DEVI Inject 0.3 mg into the muscle daily as needed (Anaphylaxis). 06/23/10  Yes Corwin Levins, MD  hydrOXYzine (ATARAX/VISTARIL) 25 MG tablet Take 25 mg by mouth 3 (three) times daily as needed for anxiety or itching.   Yes Historical  Provider, MD  ranitidine (ZANTAC) 150 MG tablet Take 150 mg by mouth 2 (two) times daily.   Yes Historical Provider, MD  Rivaroxaban 15 & 20 MG TBPK Take 15-20 mg by mouth daily. Take as directed on package: Start with one  tablet by mouth twice a day with food. On Day 22, switch to one  tablet once a day with food. 09/08/13  Yes Elease Etienne, MD  triamcinolone cream (KENALOG) 0.1 % Apply 1 application topically 2 (two) times daily.  08/11/13  Yes Historical Provider, MD   BP 112/64  Pulse 47  Temp(Src) 98.2 F (36.8 C) (Oral)  Resp 20  SpO2 100%  LMP 09/14/2013 Physical Exam  Vitals reviewed. Constitutional: She is oriented to person, place,  and time. She appears well-developed and well-nourished.  HENT:  Head: Normocephalic and atraumatic.  Right Ear: External ear normal.  Left Ear: External ear normal.  Eyes: Conjunctivae and EOM are normal. Pupils are equal, round, and reactive to light.  Neck: Normal range of motion. Neck supple.  Cardiovascular: Normal rate, regular rhythm, normal heart sounds and intact distal pulses.   Pulmonary/Chest: Effort normal and breath sounds normal.  Abdominal: Soft. Bowel sounds are normal. There is no tenderness.  Musculoskeletal: Normal range of motion.  Neurological: She is alert and oriented to person, place, and time. She has normal strength. No cranial nerve deficit or sensory deficit. Gait normal. GCS eye subscore is 4. GCS verbal subscore is 5. GCS motor subscore is 6.  Skin: Skin is warm and dry.    ED Course  Procedures (including critical care time) Labs Review Labs Reviewed  CBC WITH DIFFERENTIAL - Abnormal; Notable for the following:    Hemoglobin 11.4 (*)    HCT 34.9 (*)    All other components within normal limits  COMPREHENSIVE METABOLIC PANEL - Abnormal; Notable for the following:    ALT 38 (*)    Total Bilirubin <0.2 (*)    All other components within normal limits    Imaging Review Dg Chest 2 View  09/14/2013   CLINICAL DATA:  Chest pain.  Headache.  Dizziness.  EXAM: CHEST  2 VIEW  COMPARISON:  None.  FINDINGS: Cardiopericardial silhouette within normal limits. Mediastinal contours normal. Trachea midline. No airspace disease or effusion. Prominent vessel is present in the retrosternal clear space on the lateral view.  IMPRESSION: No active cardiopulmonary disease.   Electronically Signed   By: Andreas Newport M.D.   On: 09/14/2013 18:44   Ct Head Wo Contrast  09/14/2013   CLINICAL DATA:  Anticoagulated patient.  Headache.  EXAM: CT HEAD WITHOUT CONTRAST  TECHNIQUE: Contiguous axial images were obtained from the base of the skull through the vertex without  intravenous contrast.  COMPARISON:  None.  FINDINGS: No mass lesion, mass effect, midline shift, hydrocephalus, hemorrhage. No territorial ischemia or acute infarction.  IMPRESSION: Negative CT head.   Electronically Signed   By: Andreas Newport M.D.   On: 09/14/2013 19:08     EKG Interpretation   Date/Time:  Sunday September 14 2013 17:35:27 EDT Ventricular Rate:  54 PR Interval:  148 QRS Duration: 92 QT Interval:  529 QTC Calculation: 501 R Axis:   23 Text Interpretation:  Sinus rhythm Nonspecific T abnormalities, inferior  leads Borderline prolonged QT interval No significant change was found  Confirmed by Mirian Mo [phone]) on 09/14/2013 5:41:12 PM      MDM   Final diagnoses:  Dizziness  Acute nonintractable headache, unspecified headache type  43 y.o. female with pertinent PMH of recent PE/DVT on xarelto presents with headache, lightheadedness, dizziness beginning earlier today in setting of verbal argument with family.  No focal neuro deficits on history or exam.  No fevers, and pt has had no increase in chest pain or dyspnea.    CXR and head CT unremarkable.  History and exam not consistent with CVA, TIA, or SAH.  Although pt does have PE, at this time there is no focal neurologic problem or ataxia to suggest occult neurologic damage.  Given that she had an argument with her family and a gradual onset ha which was relieved here with reglan and benadryl, consider this likely a stress reaction, rather than acute thromboembolic process.  Similarly, without increase in dyspnea or chest pain (pt frankly denies any change in chest pain since diagnosis of PE), consider increased clot burden less likely.  Will have pt fu with PCP.  She was given strict return precautions for neuro deficit or other emergent pathology.  1. Dizziness   2. Acute nonintractable headache, unspecified headache type         Mirian Mo, MD 09/14/13 2328

## 2013-09-15 ENCOUNTER — Telehealth: Payer: Self-pay | Admitting: *Deleted

## 2013-09-15 LAB — POC URINE PREG, ED: Preg Test, Ur: NEGATIVE

## 2013-09-15 NOTE — Telephone Encounter (Signed)
[facility] Triage Call Report Triage Record Num: [id number] Operator: Albertine Grates Patient Name: Hannah Thompson Call Date & Time: 09/14/2013 4:05:27PM Patient Phone: [phone] PCP: Oliver Barre Patient Gender: Female PCP Fax : [phone] Patient DOB: 26-Dec-1970 Practice Name: [facility] Reason for Call: Caller: Mathis Fare; PCP: Oliver Barre (Adults only); CB#: [phone]; Has dizziness, nausea and headache since 9-14. Began taking Xarelto for pulmonary embolus 9-6 and thinks is cause. Dizziness is severe and states feels unsteady when walking. Per Headache protocol, advised ED due to severe dizziness. Protocol(s) Used: Headache Recommended Outcome per Protocol: See ED Immediately Reason for Outcome: New onset of severe dizziness/vertigo Care Advice: ~ 09/

## 2013-09-16 ENCOUNTER — Ambulatory Visit: Payer: No Typology Code available for payment source | Admitting: Internal Medicine

## 2013-09-16 ENCOUNTER — Encounter: Payer: Self-pay | Admitting: Family Medicine

## 2013-09-16 ENCOUNTER — Ambulatory Visit (INDEPENDENT_AMBULATORY_CARE_PROVIDER_SITE_OTHER): Payer: No Typology Code available for payment source | Admitting: Family Medicine

## 2013-09-16 VITALS — BP 120/84 | HR 72 | Ht 67.0 in | Wt 254.0 lb

## 2013-09-16 DIAGNOSIS — M25551 Pain in right hip: Secondary | ICD-10-CM

## 2013-09-16 DIAGNOSIS — M25559 Pain in unspecified hip: Secondary | ICD-10-CM

## 2013-09-16 NOTE — Progress Notes (Signed)
  [name] [address] [place], [place] [address] Phone: [phone] Subjective:     CC:  Right hip pain followup  Hannah Thompson is a 43 y.o. female coming in with complaint of right hip pain. Patient seen previously and did have a small effusion of the hip joint itself. Patient was given an intra-articular injection. Patient had been doing home exercises it was doing considerably better at about 60-80% better. Unfortunately patient's younger brother died recently and she has not been doing exercises. In addition this patient unfortunately also had a recent diagnosis of pulmonary embolism. Patient was in the ICU for multiple days. Patient states that she is feeling better and is regaining her strength. Patient has not been doing the exercises, did not do any the over-the-counter medications, patient states that the hip pain is still there but with her decreasing her activity overall it may be getting better slowly she states.    Past medical history, social, surgical and family history all reviewed in electronic medical record.   Review of Systems: No headache, visual changes, nausea, vomiting, diarrhea, constipation, dizziness, abdominal pain, skin rash, fevers, chills, night sweats, weight loss, swollen lymph nodes, body aches, joint swelling, muscle aches, chest pain, shortness of breath, mood changes.   Objective Blood pressure 120/84, pulse 72, height  (1.702 m), weight 254 lb (115.214 kg), last menstrual period 09/14/2013, SpO2 99.00%.  General: No apparent distress alert and oriented x3 mood and affect normal, dressed appropriately. Obese, Tearful HEENT: Pupils equal, extraocular movements intact  Respiratory: Patient's speak in full sentences and does not appear short of breath  Cardiovascular: No lower extremity edema, non tender, no erythema  Skin: Warm dry intact with no signs of infection or rash on extremities or on axial  skeleton.  Abdomen: Soft nontender  Neuro: Cranial nerves II through XII are intact, neurovascularly intact in all extremities with 2+ DTRs and 2+ pulses.  Lymph: No lymphadenopathy of posterior or anterior cervical chain or axillae bilaterally.  Gait normal with good balance and coordination.  MSK:  Non tender with full range of motion and good stability and symmetric strength and tone of shoulders, elbows, wrist,  knee and ankles bilaterally.  Hip: Right ROM IR: 25 Deg, ER: 45 Deg, Flexion: 120 Deg, Extension: 100 Deg, Abduction: 35 Deg, Adduction: 45 Deg Strength IR: 5/5, ER: 5/5, Flexion: 3/5, Extension: 5/5, Abduction: 4/5, Adduction: 4/5 Pelvic alignment unremarkable to inspection and palpation. Standing hip rotation and gait without trendelenburg sign / unsteadiness. Greater trochanter without tenderness to palpation. No tenderness over piriformis and greater trochanter. Negative FADIR today.  No SI joint tenderness and normal minimal SI movement. Contralateral hip unremarkable No Significant change from previous exam.    Impression and Recommendations:     This case required medical decision making of moderate complexity.   Wt Readings from Last 3 Encounters:  09/16/13 254 lb (115.214 kg)  09/09/13 249 lb 6 oz (113.116 kg)  09/06/13 253 lb 11.2 oz (115.078 kg)

## 2013-09-16 NOTE — Assessment & Plan Note (Signed)
The patient's right hip pain has been relatively nondescript. Patient did have some swelling noted of the capsule previously and did respond well to an intra-articular injection. Patient being newly on a blood thinner unlike avoid another injection. Patient having interesting medical problems recently as well as the spontaneous death of her brother in patient having a sister who had a deep venous thrombosis at a young age I think further workup may be necessary. Patient declined at this time. I would work patient up for autoimmune diseases that could be increasing patient's hypercoagulopathy. We'll discuss this further at followup. At this point patient will start to slowly increase her activity with walking over the course of the next 3 weeks. Then patient will start returning to the gym. At that point I would like to get patient into physical therapy for her hip pain. Patient then will increase the activity and come back and see me again in 5-6 weeks. Is continuing to have pain we need to consider further workup including lab results as well as potentially an MRI. Patient did have a significant number of questions and we did answer them in great detail.  Spent greater than 25 minutes with patient face-to-face and had greater than 50% of counseling including as described above in assessment and plan.

## 2013-09-16 NOTE — Patient Instructions (Signed)
Good to see you and I am so sorry to hear about everything.  We can consider getting labs at some point but lets stop the poking at this time. Call me if you want to get them sooner then later.  Ice is your friend.  Continue the medicines you are on.  Take 3 weeks to increase your strength and then start physical therapy Come back in 5-6 weeks to see how you doing.

## 2013-09-17 ENCOUNTER — Encounter: Payer: Self-pay | Admitting: Family

## 2013-09-17 ENCOUNTER — Ambulatory Visit (HOSPITAL_BASED_OUTPATIENT_CLINIC_OR_DEPARTMENT_OTHER): Payer: No Typology Code available for payment source | Admitting: Family

## 2013-09-17 ENCOUNTER — Other Ambulatory Visit (HOSPITAL_BASED_OUTPATIENT_CLINIC_OR_DEPARTMENT_OTHER): Payer: No Typology Code available for payment source | Admitting: Lab

## 2013-09-17 ENCOUNTER — Telehealth: Payer: Self-pay | Admitting: *Deleted

## 2013-09-17 VITALS — BP 109/56 | HR 76 | Temp 98.1°F | Resp 14 | Ht 67.0 in | Wt 252.0 lb

## 2013-09-17 DIAGNOSIS — I82409 Acute embolism and thrombosis of unspecified deep veins of unspecified lower extremity: Secondary | ICD-10-CM

## 2013-09-17 DIAGNOSIS — I2699 Other pulmonary embolism without acute cor pulmonale: Secondary | ICD-10-CM

## 2013-09-17 DIAGNOSIS — I82509 Chronic embolism and thrombosis of unspecified deep veins of unspecified lower extremity: Secondary | ICD-10-CM

## 2013-09-17 DIAGNOSIS — I82491 Acute embolism and thrombosis of other specified deep vein of right lower extremity: Secondary | ICD-10-CM

## 2013-09-17 LAB — CBC WITH DIFFERENTIAL (CANCER CENTER ONLY)
BASO#: 0 10*3/uL (ref 0.0–0.2)
BASO%: 0.2 % (ref 0.0–2.0)
EOS%: 2.7 % (ref 0.0–7.0)
Eosinophils Absolute: 0.1 10*3/uL (ref 0.0–0.5)
HCT: 36.4 % (ref 34.8–46.6)
HGB: 12 g/dL (ref 11.6–15.9)
LYMPH#: 1.9 10*3/uL (ref 0.9–3.3)
LYMPH%: 37.2 % (ref 14.0–48.0)
MCH: 28.2 pg (ref 26.0–34.0)
MCHC: 33 g/dL (ref 32.0–36.0)
MCV: 86 fL (ref 81–101)
MONO#: 0.3 10*3/uL (ref 0.1–0.9)
MONO%: 6.6 % (ref 0.0–13.0)
NEUT#: 2.8 10*3/uL (ref 1.5–6.5)
NEUT%: 53.3 % (ref 39.6–80.0)
PLATELETS: 213 10*3/uL (ref 145–400)
RBC: 4.25 10*6/uL (ref 3.70–5.32)
RDW: 14.7 % (ref 11.1–15.7)
WBC: 5.2 10*3/uL (ref 3.9–10.0)

## 2013-09-17 LAB — BASIC METABOLIC PANEL
BUN: 10 mg/dL (ref 6–23)
CO2: 23 mEq/L (ref 19–32)
Calcium: 9.3 mg/dL (ref 8.4–10.5)
Chloride: 103 mEq/L (ref 96–112)
Creatinine, Ser: 0.77 mg/dL (ref 0.50–1.10)
GLUCOSE: 101 mg/dL — AB (ref 70–99)
Potassium: 3.9 mEq/L (ref 3.5–5.3)
SODIUM: 137 meq/L (ref 135–145)

## 2013-09-17 NOTE — Telephone Encounter (Signed)
Pt left msg on triage requesting temporary handicap form. She stted the pain is not better at all...Raechel Chute

## 2013-09-17 NOTE — Telephone Encounter (Signed)
No Need to see me again in next week or so then

## 2013-09-18 LAB — D-DIMER, QUANTITATIVE: D-Dimer, Quant: 0.48 ug/mL-FEU (ref 0.00–0.48)

## 2013-09-18 NOTE — Telephone Encounter (Signed)
Notified pt with md response. Made f/u appt for next week 09/23/13...Raechel Chute

## 2013-09-18 NOTE — Progress Notes (Signed)
[facility]  [phone] [phone]  ID: Hannah Thompson OB: 04/17/1970 MR#: [id number] JYN#:[id number] Patient Care Team: Corwin Levins, MD as PCP - General  DIAGNOSIS: Acute PE DVT in right lower extremity Thrombocytopenia  INTERVAL HISTORY: Hannah Thompson is a very pleasant 43 yo female with thrombocytopenia. Was recently hospitalized with a PE and right lower extremity DVT. She is on Xarelto at this time and seems to be doing well with it. She is still having some issues with headaches and SOB but these are improving. She denies bleeding or pain. She states that her leg feels much better. She is needing some better fitting compression stockings. The ones she has now are too large. She needs to be fitted. She denies fever, chills, n/v, cough, rash, dizziness, chest pain, palpitations, abdominal pain, constipation, diarrhea, blood in urine or stool. Her swelling in her right leg has gone down. She denies swelling, tenderness, numbness and tingling in any of her other extremities. Her appetite is good and she is drinking plenty of fluids. Her weight is stable. Overall she seems to be feeling much better.    CURRENT TREATMENT: Xarelto   REVIEW OF SYSTEMS: All other 10 point review of systems is negative.   PAST MEDICAL HISTORY: Past Medical History  Diagnosis Date  . Headache(784.0) 06/21/2009    recurrent  . VITAMIN B12 DEFICIENCY 03/18/2009  . ASTHMA 03/17/2009  . ALLERGIC RHINITIS 03/17/2009  . ECZEMA 03/17/2009  . DISC DISEASE, LUMBAR 03/17/2009    mild facet arthropathy by MRI 2005  . HYPERLIPIDEMIA 03/17/2009  . ANEMIA-NOS 03/17/2009  . KELOID 06/21/2009    PAST SURGICAL HISTORY: Past Surgical History  Procedure Laterality Date  . Keloid excision  mid 1990's    s/p keloid removal-laser     FAMILY HISTORY Family History  Problem Relation Age of Onset  . Cancer Father     Prostate Cancer  . Hypertension Father   . Allergies Sister   . Cancer  Paternal Aunt     Ovarian Cancer  . Cancer Other     Grandparent-Lung Cancer  . Diabetes Other     Grandparent  . Heart disease Other     Grandparent  . Alcohol abuse Other     Several on both sides of family-3 uncles  . Hypertension Mother   . Diabetes Mother   . Deep vein thrombosis Sister     GYNECOLOGIC HISTORY:  Patient's last menstrual period was 09/14/2013.   SOCIAL HISTORY:  History   Social History Narrative  . No narrative on file   ADVANCED DIRECTIVES: <no information>  HEALTH MAINTENANCE: History  Substance Use Topics  . Smoking status: Former Smoker -- 0.25 packs/day for 4 years    Types: Cigarettes    Start date: 03/17/2004    Quit date: 04/17/2008  . Smokeless tobacco: Never Used     Comment: quit 5 years ago  . Alcohol Use: No     Comment: socially   Colonoscopy: PAP: Bone density: Lipid panel:  Allergies  Allergen Reactions  . Oxaprozin     angioedema  . Sulfonamide Derivatives     REACTION: hives  . Orange Juice [Orange Oil] Hives    Orange juice  . Tomato Hives    tomatoes  . Nsaids Rash    Current Outpatient Prescriptions  Medication Sig Dispense Refill  . acetaminophen (TYLENOL) 325 MG tablet Take 2 tablets (650 mg total) by mouth every 6 (six) hours as needed for mild pain,  moderate pain or headache (or Fever >/= 101).      . cyanocobalamin 2000 MCG tablet Take 2,000 mcg by mouth daily.      Marland Kitchen desonide (DESOWEN) 0.05 % ointment Apply 1 application topically 2 (two) times daily.       Marland Kitchen EPINEPHrine (EPI-PEN) 0.3 mg/0.3 mL DEVI Inject 0.3 mg into the muscle daily as needed (Anaphylaxis).      . hydrOXYzine (ATARAX/VISTARIL) 25 MG tablet Take 25 mg by mouth 3 (three) times daily as needed for anxiety or itching.      . ranitidine (ZANTAC) 150 MG tablet Take 150 mg by mouth 2 (two) times daily.      . rivaroxaban (XARELTO) 20 MG TABS tablet Take 15 mg by mouth as directed. Start 09-09-13  15 mg. Bid for 21 days  Then 20 mg daily      .  triamcinolone cream (KENALOG) 0.1 % Apply 1 application topically 2 (two) times daily.        No current facility-administered medications for this visit.    OBJECTIVE: Filed Vitals:   09/17/13 1445  BP: 109/56  Pulse: 76  Temp: 98.1 F (36.7 C)  Resp: 14   Body mass index is 39.46 kg/(m^2). ECOG FS:1 - Symptomatic but completely ambulatory Ocular: Sclerae unicteric, pupils equal, round and reactive to light Ear-nose-throat: Oropharynx clear, dentition fair Lymphatic: No cervical or supraclavicular adenopathy Lungs no rales or rhonchi, good excursion bilaterally Heart regular rate and rhythm, no murmur appreciated Abd soft, nontender, positive bowel sounds MSK no focal spinal tenderness, no joint edema Neuro: non-focal, well-oriented, appropriate affect Breasts: Deferred  LAB RESULTS: CMP     Component Value Date/Time   NA 137 09/17/2013 1353   K 3.9 09/17/2013 1353   CL 103 09/17/2013 1353   CO2 23 09/17/2013 1353   GLUCOSE 101* 09/17/2013 1353   BUN 10 09/17/2013 1353   CREATININE 0.77 09/17/2013 1353   CALCIUM 9.3 09/17/2013 1353   PROT 7.1 09/14/2013 1904   ALBUMIN 3.5 09/14/2013 1904   AST 20 09/14/2013 1904   ALT 38* 09/14/2013 1904   ALKPHOS 61 09/14/2013 1904   BILITOT <0.2* 09/14/2013 1904   GFRNONAA >90 09/14/2013 1904   GFRAA >90 09/14/2013 1904   I No results found for this basename: SPEP, UPEP,  kappa and lambda light chains   Lab Results  Component Value Date   WBC 5.2 09/17/2013   NEUTROABS 2.8 09/17/2013   HGB 12.0 09/17/2013   HCT 36.4 09/17/2013   MCV 86 09/17/2013   PLT 213 09/17/2013   No results found for this basename: LABCA2   No components found with this basename: [id number]   No results found for this basename: INR,  in the last 168 hours  STUDIES: None  ASSESSMENT/PLAN: Hannah Thompson is a very pleasant 43 yo female with thrombocytopenia. Was recently hospitalized with a PE and right lower extremity DVT. She is on Xarelto at this time and seems to be  doing well with it. She is still symptomatic at times with OB and the occasional headache.  We will continue with the Xarelto for now. We will see her back in 2 months for follow-up and labs.  We will also schedule her for repeat CT angio of chest and Doppler study of her right leg the same day as her follow-up.  I will also get her a prescription so she can go be fitted for a compression stocking.  She knows to call here with any questions  or concerns and to go the ED in the event of an emergency.   Verdie Mosher, NP 09/18/2013 9:51 AM

## 2013-09-23 ENCOUNTER — Encounter: Payer: Self-pay | Admitting: Family Medicine

## 2013-09-23 ENCOUNTER — Ambulatory Visit (INDEPENDENT_AMBULATORY_CARE_PROVIDER_SITE_OTHER): Payer: No Typology Code available for payment source

## 2013-09-23 ENCOUNTER — Ambulatory Visit (INDEPENDENT_AMBULATORY_CARE_PROVIDER_SITE_OTHER): Payer: No Typology Code available for payment source | Admitting: Family Medicine

## 2013-09-23 VITALS — BP 108/72 | HR 59 | Ht 67.0 in | Wt 255.0 lb

## 2013-09-23 DIAGNOSIS — R768 Other specified abnormal immunological findings in serum: Secondary | ICD-10-CM

## 2013-09-23 DIAGNOSIS — M2550 Pain in unspecified joint: Secondary | ICD-10-CM

## 2013-09-23 DIAGNOSIS — M25559 Pain in unspecified hip: Secondary | ICD-10-CM

## 2013-09-23 DIAGNOSIS — M25551 Pain in right hip: Secondary | ICD-10-CM

## 2013-09-23 LAB — SEDIMENTATION RATE: Sed Rate: 37 mm/hr — ABNORMAL HIGH (ref 0–22)

## 2013-09-23 LAB — C-REACTIVE PROTEIN: CRP: 0.8 mg/dL (ref 0.5–20.0)

## 2013-09-23 MED ORDER — TRAMADOL HCL 50 MG PO TABS: 50.0000 mg | ORAL_TABLET | Freq: Every evening | ORAL | Status: DC | PRN

## 2013-09-23 NOTE — Patient Instructions (Addendum)
Good to see you We will get MRI and will get it open.  We will get labs today as well.  Ice is your friend.  Tramadol at night.  Come back 1-2 days after the MRI and we will discuss findings.

## 2013-09-23 NOTE — Assessment & Plan Note (Addendum)
Patient's right hip pain is still difficult to assess completely. Patient is not the best historian. Patient has not responded well to conservative therapy at this time. Continues to have groin pain and does have positive pain with internal rotation. We discussed different treatment options I feel that further imaging is necessary. Patient's x-rays previously did show some mild osteophytic changes and I do think that further imaging may be helpful. MRI of the right hip is ordered. We are ruling out any early avascular necrosis with patient having a recent blood clot as well as some labral pathology. Patient does have some anxiety so will do an open MRI if possible. Patient will come back with a 2 days afterwards for further evaluation.  With patient other comorbidities I feel that further workup is necessary for these different joint pains. Patient will have lab work done today. Patient is also had a brother and a sister who recently has had significant coagulopathies. We'll rule out autoimmune disorders.  Spent greater than 25 minutes with patient face-to-face and had greater than 50% of counseling including as described above in assessment and plan.

## 2013-09-23 NOTE — Progress Notes (Signed)
  [name] [address] [place], [place] [address] Phone: [phone] Subjective:     CC:  Right hip pain followup  QMV:[id number] NYONNA HARGROVE is a 43 y.o. female coming in with complaint of right hip pain. Patient seen previously and did have a small effusion of the hip joint itself. Patient unfortunately got sick with pulmonary embolism and was unable to do the exercises. Patient was started on the exercises again slowly. In addition this patient was sent to formal physical therapy. Patient states she has not made any significant improvement. Continues to have recurrent pain. Patient states that if she walks more than 200 feet she has significant pain in the groin and is continuing to be tired. Patient has tried to increase her activity but is finding it difficult. Patient continues on blood thinner at this time.  Past medical history, social, surgical and family history all reviewed in electronic medical record.   Review of Systems: No headache, visual changes, nausea, vomiting, diarrhea, constipation, dizziness, abdominal pain, skin rash, fevers, chills, night sweats, weight loss, swollen lymph nodes, body aches, joint swelling, muscle aches, chest pain, shortness of breath, mood changes.   Objective Blood pressure 108/72, pulse 59, height  (1.702 m), weight 255 lb (115.667 kg), last menstrual period 09/14/2013, SpO2 98.00%.  General: No apparent distress alert and oriented x3 mood and affect normal, dressed appropriately. Obese, Tearful HEENT: Pupils equal, extraocular movements intact  Respiratory: Patient's speak in full sentences and does not appear short of breath  Cardiovascular: No lower extremity edema, non tender, no erythema  Skin: Warm dry intact with no signs of infection or rash on extremities or on axial skeleton.  Abdomen: Soft nontender  Neuro: Cranial nerves II through XII are intact, neurovascularly intact in all extremities with 2+  DTRs and 2+ pulses.  Lymph: No lymphadenopathy of posterior or anterior cervical chain or axillae bilaterally.  Gait normal with good balance and coordination.  MSK:  Non tender with full range of motion and good stability and symmetric strength and tone of shoulders, elbows, wrist,  knee and ankles bilaterally.  Hip: Right ROM IR: 25 Deg with groin discomfort, ER: 45 Deg, Flexion: 120 Deg, Extension: 100 Deg, Abduction: 35 Deg, Adduction: 45 Deg Strength IR: 5/5, ER: 5/5, Flexion: 3/5, Extension: 5/5, Abduction: 4/5, Adduction: 4/5 Pelvic alignment unremarkable to inspection and palpation. Standing hip rotation and gait without trendelenburg sign / unsteadiness. Greater trochanter without tenderness to palpation. No tenderness over piriformis and greater trochanter. Positive FADIR today. This is worse than previous exam No SI joint tenderness and normal minimal SI movement. Contralateral hip unremarkable No Significant change from previous exam.    Impression and Recommendations:     This case required medical decision making of moderate complexity.

## 2013-09-24 LAB — SJOGRENS SYNDROME-B EXTRACTABLE NUCLEAR ANTIBODY: SSB (La) (ENA) Antibody, IgG: <1

## 2013-09-24 LAB — SJOGRENS SYNDROME-A EXTRACTABLE NUCLEAR ANTIBODY: SSA (Ro) (ENA) Antibody, IgG: <1

## 2013-09-24 LAB — RHEUMATOID FACTOR

## 2013-09-24 LAB — ANTI-NUCLEAR AB-TITER (ANA TITER): ANA Titer 1: 1:40 {titer} — ABNORMAL HIGH

## 2013-09-24 LAB — ANA: Anti Nuclear Antibody(ANA): POSITIVE — AB

## 2013-09-25 NOTE — Addendum Note (Signed)
Addended by: Edwena Felty T on: 09/25/2013 02:10 PM   Modules accepted: Orders

## 2013-09-29 ENCOUNTER — Telehealth: Payer: Self-pay | Admitting: Internal Medicine

## 2013-09-29 NOTE — Telephone Encounter (Signed)
Patient Information:  Caller Name: [name]  Phone: [phone]  Patient: Hannah Thompson, Hannah Thompson  Gender: Female  DOB: 09/07/1970  Age: 43 Years  PCP: Oliver Barre (Adults only)  Pregnant: No  Office Follow Up:  Does the office need to follow up with this patient?: No  Instructions For The Office: N/A  RN Note:  Pt does have a f.u scheduled in 2 weeks.  Symptoms  Reason For Call & Symptoms: Pt has bilateral hose ordered from the hospital at the time of the DVT diagnosis of right lower leg. Can u please tell her how long she is to wear these? (Rn checked the chart and did not see an order for that). Pt wears them all day during the day and takes them off at night. She is back to work and today after removing them found that her ankles were swollen/bilaterally.  Reviewed Health History In EMR: Yes  Reviewed Medications In EMR: Yes  Reviewed Allergies In EMR: Yes  Reviewed Surgeries / Procedures: Yes  Date of Onset of Symptoms: 09/29/2013 OB / GYN:  LMP: 09/15/2013  Guideline(s) Used:  Leg Swelling and Edema  Disposition Per Guideline:   See Within 2 Weeks in Office  Reason For Disposition Reached:   Mild swelling of both ankles and chronic (unchanged)  Advice Given:  N/A  Patient Will Follow Care Advice:  YES

## 2013-10-07 ENCOUNTER — Inpatient Hospital Stay (HOSPITAL_COMMUNITY): Admission: RE | Admit: 2013-10-07 | Payer: No Typology Code available for payment source | Source: Ambulatory Visit

## 2013-10-08 ENCOUNTER — Encounter: Payer: Self-pay | Admitting: Internal Medicine

## 2013-10-08 ENCOUNTER — Ambulatory Visit (HOSPITAL_COMMUNITY)
Admission: RE | Admit: 2013-10-08 | Discharge: 2013-10-08 | Disposition: A | Discharge status: Home / Self Care | Payer: No Typology Code available for payment source | Source: Ambulatory Visit | Attending: Cardiovascular Disease | Admitting: Cardiovascular Disease

## 2013-10-08 ENCOUNTER — Telehealth: Payer: Self-pay | Admitting: Internal Medicine

## 2013-10-08 DIAGNOSIS — I2699 Other pulmonary embolism without acute cor pulmonale: Secondary | ICD-10-CM | POA: Diagnosis present

## 2013-10-08 DIAGNOSIS — I82491 Acute embolism and thrombosis of other specified deep vein of right lower extremity: Secondary | ICD-10-CM

## 2013-10-08 MED ORDER — RIVAROXABAN 20 MG PO TABS: 20.0000 mg | ORAL_TABLET | Freq: Every day | ORAL | Status: DC

## 2013-10-08 NOTE — Telephone Encounter (Signed)
xarelto refill done per pt request

## 2013-10-08 NOTE — Progress Notes (Signed)
2D Echo Performed 10/08/2013     , RCS  

## 2013-10-10 ENCOUNTER — Telehealth: Payer: Self-pay | Admitting: Internal Medicine

## 2013-10-10 NOTE — Telephone Encounter (Signed)
Medication has been filled 

## 2013-10-10 NOTE — Telephone Encounter (Signed)
Patient need authorization for meds Xarelto 20 mg

## 2013-10-14 ENCOUNTER — Telehealth: Payer: Self-pay | Admitting: Internal Medicine

## 2013-10-14 ENCOUNTER — Telehealth: Payer: Self-pay | Admitting: *Deleted

## 2013-10-14 DIAGNOSIS — I2699 Other pulmonary embolism without acute cor pulmonale: Secondary | ICD-10-CM

## 2013-10-14 DIAGNOSIS — Z7901 Long term (current) use of anticoagulants: Secondary | ICD-10-CM

## 2013-10-14 MED ORDER — RIVAROXABAN 20 MG PO TABS: 20.0000 mg | ORAL_TABLET | Freq: Every day | ORAL | Status: DC

## 2013-10-14 MED ORDER — APIXABAN 5 MG PO TABS: 5.0000 mg | ORAL_TABLET | Freq: Two times a day (BID) | ORAL | Status: DC

## 2013-10-14 NOTE — Telephone Encounter (Signed)
Caller: Hannah Thompson/Patient; Phone: [phone]; Reason for Call: Calling because she has not had Xarelto for 2 days because it hasn't been cleared by insurance company.  It costs $12 a pill and she can't afford that.  Pt wants to know if there is anything that the office can do and if it is ok to not be taking med.  PLEASE ADVISE.

## 2013-10-14 NOTE — Telephone Encounter (Signed)
[facility] Triage Call Report Triage Record Num: [id number] Operator: Benson SettingHelen Yousef Patient Name: Karel JarvisWanda Galentine Call Date & Time: 10/12/2013 4:40:50PM Patient Phone: [phone](336) [phone] PCP: Oliver BarreJames John Patient Gender: Female PCP Fax : [phone] Patient DOB: 1970/06/07 Practice Name: [facility] Reason for Call: Caller: [name]; PCP: Oliver BarreJohn, James (Adults only); CB#: [phone]; Call regarding Medication Issue; Medication(s): Xarelto ; Pt states that the pharmacy sent a request to office for a pre authorization on the medication last week but has still not gotten anything back. Pt advised to call office in the morning to inform them the pharmacy has not received the prior authorization. States verbal understanding and agreement. Protocol(s) Used: Medication Questions - Adult Recommended Outcome per Protocol: Speak with Provider or Pharmacist within 24 hours Reason for Outcome: Requests refill of prescribed medication with valid refills; lack of medications does not put patient at clinical risk Care Advice: ~

## 2013-10-14 NOTE — Telephone Encounter (Signed)
Unfortunately I have nothing to offer to belay the cost of xarelto, since this is between her and her insurance.  I have changed her rx to eliquis at 5 mg twice per day, in the hopes that this is more affordable.  If not, she should make ROV immediately to start bridge lovenox, then coumadin

## 2013-10-14 NOTE — Telephone Encounter (Signed)
Pt called in said that [facility] went to [facility] and they still dont have the meds that were called in on 10/9.

## 2013-10-14 NOTE — Telephone Encounter (Signed)
Medication sent in as requested.

## 2013-10-15 ENCOUNTER — Telehealth: Payer: Self-pay | Admitting: *Deleted

## 2013-10-15 DIAGNOSIS — I2699 Other pulmonary embolism without acute cor pulmonale: Secondary | ICD-10-CM | POA: Insufficient documentation

## 2013-10-15 MED ORDER — WARFARIN SODIUM 5 MG PO TABS: 5.0000 mg | ORAL_TABLET | Freq: Every day | ORAL | Status: DC

## 2013-10-15 MED ORDER — ENOXAPARIN SODIUM 100 MG/ML ~~LOC~~ SOLN: 100.0000 mg | SUBCUTANEOUS | Status: DC

## 2013-10-15 NOTE — Telephone Encounter (Signed)
Called the patient informed of all MD instructions on medication.  The patient did agree to go to the pharmacy tonight for her lovenox and do one admin.tonight.  Informed the patient that Marcelino DusterMichelle coumadin RN at our office will call her tomorrow to schedule/discuss coumadin for her. The patient did verbalize understanding of all instructions.

## 2013-10-15 NOTE — Telephone Encounter (Signed)
I was unaware of any attempts at Prior auth as well.  Please call pt back later regarding outcome of her pharmacy visit

## 2013-10-15 NOTE — Telephone Encounter (Signed)
Pt will need start lovenox, as well as coumadin, and coumadin clinic referral  I have done these  Please have pt bring lovenox here , or have pharmacist show pt how to admin - to hopefully start lovenox tonight, then coumadin tomorrow  Corrie DandyMary to refer pt to coumadin clinic -  Marcelino DusterMichelle to note above  For some reason the computer will not let me close/sigh the order for the coumadin clinic

## 2013-10-15 NOTE — Telephone Encounter (Signed)
Called the patient as you asked first thing this morning.  This patient does not want to change and is confused as to the authorization on xarelto.  We have received nothing regarding a PA on this medication and tried to explain to this patient but unable to do so.  She did state she would go to the pharmacy this morning to discuss situation as well as check on the new medication PCP sent in last night.

## 2013-10-15 NOTE — Telephone Encounter (Signed)
[facility] Triage Call Report Triage Record Num: [id number] Operator: Macarthur CritchleyKeri Otto Patient Name: Hannah JarvisWanda Thompson Call Date & Time: 10/14/2013 5:14:20PM Patient Phone: [phone]) [phone] PCP: Oliver BarreJames John Patient Gender: Female PCP Fax : [phone]) [phone] Patient DOB: 27-Apr-1970 Practice Name: [facility] Reason for Call: Caller: [name]; PCP: Oliver BarreJohn, James (Adults only); CB#: [phone]; Patient called stating she has not taken her Xarelto x2 days d/t prior authorization not on file & out of pocket cost is $12/pill which is too expensive for her. Is concerned whether or not this is life threatening for her to be without it. Reviewed EMR & noted that patient given 30 day free trial upon d/c from hospital on 09/08/13. Patient's understanding was that Xarelto was initially pre-authorized prior to dispensing 30 day free trial to ensure ability to continue medication. Advised patient that if she can afford to at least get a few days supply she needs to continue taking to prevent long term sequelae while this issue gets resolved.[name] Contacted [facility] & disucssed situation with pharmacy staff. PA sent to office on 10/08/13 & insurance is still indicatating PA due. Advised patient of findings & advised to continue to follow up with her insurance company daily to ensure it is done in a timely manner. PLEASE ENSURE PA FOR XARELTO SUBMITTED BY [facility] ON 10/08/13 HAS BEEN SUBMITTED TO [facility] & FOLLOW UP AS NEEDED. PATIENT HAS NOT BEEN TAKING HER XARELTO AS SHE CANNOT AFFORD THE DAILY OUT OF POCKET COST. Protocol(s) Used: Medication Questions - Adult Recommended Outcome per Protocol: Call Dispensing Pharmacy or Provider Immediately Reason for Outcome: Unable to obtain prescribed medication related to available resources AND situation poses immediate clinical risk Care Advice: ~ 10/

## 2013-10-15 NOTE — Telephone Encounter (Signed)
Called Hannah Thompson to get PA for Xarelto at [phone] and was denied as patient has to have been on warfarin and failed in order to approve Xarelto.  Advise please.

## 2013-10-15 NOTE — Telephone Encounter (Signed)
Pt request phone call from the assistant concern the PA for Xarelto. Drug store told pt that we are not calling the insurance to get the PA start. Please call pt, she need to speak to someone about this [phone].

## 2013-10-16 NOTE — Telephone Encounter (Signed)
Spoke to the patient and she will continue on xarelto and keep her appointment Tuesday 10/21/13 with Marcelino DusterMichelle coumadin RN to discuss starting coumadin.

## 2013-10-16 NOTE — Telephone Encounter (Signed)
Message copied by Pincus SanesEWING, ROBIN B on Thu Oct 16, 2013  4:06 PM ------      Message from: Elberta LeatherwoodWARNER, MICHELLE L      Created: Thu Oct 16, 2013  8:58 AM       Dr. Jonny RuizJohn-            This pt is not able to afford the lovenox injections that your prescribed. Pt is continuing to take the remainder of the xarelto tablets she has left. Pt is able to afford coumadin prescription. Please advise.            Thanks -      Marcelino DusterMichelle  ------

## 2013-10-17 ENCOUNTER — Ambulatory Visit
Admission: RE | Admit: 2013-10-17 | Discharge: 2013-10-17 | Disposition: A | Discharge status: Home / Self Care | Payer: No Typology Code available for payment source | Source: Ambulatory Visit | Attending: Family Medicine | Admitting: Family Medicine

## 2013-10-17 DIAGNOSIS — M25551 Pain in right hip: Secondary | ICD-10-CM

## 2013-10-21 ENCOUNTER — Encounter: Payer: Self-pay | Admitting: Internal Medicine

## 2013-10-21 ENCOUNTER — Ambulatory Visit (INDEPENDENT_AMBULATORY_CARE_PROVIDER_SITE_OTHER): Payer: No Typology Code available for payment source | Admitting: Internal Medicine

## 2013-10-21 ENCOUNTER — Telehealth: Payer: Self-pay

## 2013-10-21 ENCOUNTER — Ambulatory Visit: Payer: No Typology Code available for payment source

## 2013-10-21 VITALS — BP 112/72 | HR 67 | Temp 98.0°F | Wt 259.0 lb

## 2013-10-21 DIAGNOSIS — E785 Hyperlipidemia, unspecified: Secondary | ICD-10-CM

## 2013-10-21 DIAGNOSIS — I2699 Other pulmonary embolism without acute cor pulmonale: Secondary | ICD-10-CM

## 2013-10-21 DIAGNOSIS — Z7901 Long term (current) use of anticoagulants: Secondary | ICD-10-CM

## 2013-10-21 NOTE — Assessment & Plan Note (Signed)
stable overall by history and exam, recent data reviewed with pt, and pt to continue medical treatment as before,  to f/u any worsening symptoms or concerns Lab Results  Component Value Date   LDLCALC 97 06/16/2010

## 2013-10-21 NOTE — Telephone Encounter (Signed)
OK to let pt know , we tried twice but were unable to get a PA approval for her xarelto  She would need to pay cash, or start coumadin - I have already set this up with Serena ColonelMichelle Warner/coumadin clinic

## 2013-10-21 NOTE — Patient Instructions (Signed)
Please continue all other medications as before, and refills have been done if requested.  Please have the pharmacy call with any other refills you may need.  Please continue your efforts at being more active, low cholesterol diet, and weight control.  You are otherwise up to date with prevention measures today.  Please keep your appointments with your specialists as you may have planned  Please return in 6 months, or sooner if needed 

## 2013-10-21 NOTE — Assessment & Plan Note (Signed)
Will try PA for xarelto, has already been denied once, tried to d/w pt but not really accepting this, will try again, but may eventually need to start coumadin if this is most affordable

## 2013-10-21 NOTE — Progress Notes (Signed)
Pre visit review using our clinic review tool, if applicable. No additional management support is needed unless otherwise documented below in the visit note. 

## 2013-10-21 NOTE — Assessment & Plan Note (Signed)
Improved with recent echo, d/w pt, cont xarelto for now

## 2013-10-21 NOTE — Telephone Encounter (Signed)
Completed PA for Xarelto 20 mg.  Did received denial per [facility], Xarelto is covered after documentation of a step through warfarin and in this case would be approved.   Please advise on instructions regarding denial.

## 2013-10-21 NOTE — Progress Notes (Signed)
Subjective:    Patient ID: Hannah Thompson, female    DOB: [birth year], 43 y.o.   MRN: [id number]  HPI  Here to f/u; overall doing ok,  Pt denies chest pain, increased sob or doe, wheezing, orthopnea, PND, increased LE swelling, palpitations, dizziness or syncope.  Pt denies polydipsia, polyuria, or low sugar symptoms such as weakness or confusion improved with po intake.  Pt denies new neurological symptoms such as new headache, or facial or extremity weakness or numbness.   Pt states overall good compliance with meds, has been trying to follow lower cholesterol diet, with wt overall stable,  but little exercise however. No overt bleeding or bruising on xarelto. Has about 2 wks left.  Wants us to try the PA again, so she can try this, instead of coumadin Recent oct 2015 echo essentially normalized, no evidence for right heart strain.  Past Medical History  Diagnosis Date  . Headache(784.0) 06/21/2009    recurrent  . VITAMIN B12 DEFICIENCY 03/18/2009  . ASTHMA 03/17/2009  . ALLERGIC RHINITIS 03/17/2009  . ECZEMA 03/17/2009  . DISC DISEASE, LUMBAR 03/17/2009    mild facet arthropathy by MRI 2005  . HYPERLIPIDEMIA 03/17/2009  . ANEMIA-NOS 03/17/2009  . KELOID 06/21/2009   Past Surgical History  Procedure Laterality Date  . Keloid excision  mid 1990's    s/p keloid removal-laser     reports that she quit smoking about 5 years ago. Her smoking use included Cigarettes. She started smoking about 9 years ago. She has a 1 pack-year smoking history. She has never used smokeless tobacco. She reports that she does not drink alcohol or use illicit drugs. family history includes Alcohol abuse in her other; Allergies in her sister; Cancer in her father, other, and paternal aunt; Deep vein thrombosis in her sister; Diabetes in her mother and other; Heart disease in her other; Hypertension in her father and mother. Allergies  Allergen Reactions  . Oxaprozin     angioedema  . Sulfonamide Derivatives    REACTION: hives  . Orange Juice [Orange Oil] Hives    Orange juice  . Tomato Hives    tomatoes  . Nsaids Rash   Current Outpatient Prescriptions on File Prior to Visit  Medication Sig Dispense Refill  . acetaminophen (TYLENOL) 325 MG tablet Take 2 tablets (650 mg total) by mouth every 6 (six) hours as needed for mild pain, moderate pain or headache (or Fever >/= 101).      . cyanocobalamin 2000 MCG tablet Take 2,000 mcg by mouth daily.      Marland Kitchen. desonide (DESOWEN) 0.05 % ointment Apply 1 application topically 2 (two) times daily.       . ranitidine (ZANTAC) 150 MG tablet Take 150 mg by mouth 2 (two) times daily.      . traMADol (ULTRAM) 50 MG tablet Take 1 tablet (50 mg total) by mouth at bedtime as needed.  30 tablet  0  . enoxaparin (LOVENOX) 100 MG/ML injection Inject 1 mL (100 mg total) into the skin daily.  10 mL  0  . EPINEPHrine (EPI-PEN) 0.3 mg/0.3 mL DEVI Inject 0.3 mg into the muscle daily as needed (Anaphylaxis).      . hydrOXYzine (ATARAX/VISTARIL) 25 MG tablet Take 25 mg by mouth 3 (three) times daily as needed for anxiety or itching.      . triamcinolone cream (KENALOG) 0.1 % Apply 1 application topically 2 (two) times daily.       Marland Kitchen. warfarin (COUMADIN) 5 MG tablet  Take 1 tablet (5 mg total) by mouth daily.  90 tablet  0   No current facility-administered medications on file prior to visit.   Review of Systems  Constitutional: Negative for unusual diaphoresis or other sweats  HENT: Negative for ringing in ear Eyes: Negative for double vision or worsening visual disturbance.  Respiratory: Negative for choking and stridor.   Gastrointestinal: Negative for vomiting or other signifcant bowel change Genitourinary: Negative for hematuria or decreased urine volume.  Musculoskeletal: Negative for other MSK pain or swelling Skin: Negative for color change and worsening wound.  Neurological: Negative for tremors and numbness other than noted  Psychiatric/Behavioral: Negative for  decreased concentration or agitation other than above       Objective:   Physical Exam BP 112/72  Pulse 67  Temp(Src) 98 F (36.7 C) (Oral)  Wt 259 lb (117.482 kg)  SpO2 99% VS noted,  Constitutional: Pt appears well-developed, well-nourished.  HENT: Head: NCAT.  Right Ear: External ear normal.  Left Ear: External ear normal.  Eyes: . Pupils are equal, round, and reactive to light. Conjunctivae and EOM are normal Neck: Normal range of motion. Neck supple.  Cardiovascular: Normal rate and regular rhythm.   Pulmonary/Chest: Effort normal and breath sounds normal.  Abd:  Soft, NT, ND, + BS Neurological: Pt is alert. Not confused , motor grossly intact Skin: Skin is warm. No rash Psychiatric: Pt behavior is normal. No agitation.     Assessment & Plan:

## 2013-10-22 NOTE — Telephone Encounter (Signed)
Called the patient informed of MD instructions.  She did verbalize understanding of results of PA.

## 2013-11-03 ENCOUNTER — Encounter: Payer: Self-pay | Admitting: Internal Medicine

## 2013-11-06 ENCOUNTER — Telehealth: Payer: Self-pay | Admitting: Internal Medicine

## 2013-11-06 DIAGNOSIS — Z7901 Long term (current) use of anticoagulants: Secondary | ICD-10-CM

## 2013-11-06 NOTE — Telephone Encounter (Signed)
Called the patient and she is referring to Xarelto.  This has been addressed twice now and both times the PA has been denied for Xarelto and patient informed of result. See telephone note 10/21/13.

## 2013-11-06 NOTE — Telephone Encounter (Signed)
Pt called and said that is is about to run out of her meds and needs to know which one dr Jonny Ruizjohn was going to switch her to?  She is requesting a return call

## 2013-11-06 NOTE — Telephone Encounter (Signed)
Patient informed of instructions

## 2013-11-06 NOTE — Telephone Encounter (Signed)
To take rx as already done; [facility]

## 2013-11-06 NOTE — Telephone Encounter (Signed)
I have nothing else to offer.  I have already sent rx for coumadin to pharmacy oct 14, and will need f/ju INR at one wk with me if coumadin clinic is not currently active

## 2013-11-06 NOTE — Telephone Encounter (Signed)
She is ok to do coumadin.  Advise on how to transition over.  Does she need to plan to come to the lab in one week for INR, not sure if coumadin clinic will be open or not?

## 2013-11-21 ENCOUNTER — Other Ambulatory Visit (INDEPENDENT_AMBULATORY_CARE_PROVIDER_SITE_OTHER): Payer: No Typology Code available for payment source

## 2013-11-21 ENCOUNTER — Ambulatory Visit (INDEPENDENT_AMBULATORY_CARE_PROVIDER_SITE_OTHER): Payer: No Typology Code available for payment source | Admitting: Internal Medicine

## 2013-11-21 ENCOUNTER — Encounter: Payer: Self-pay | Admitting: Internal Medicine

## 2013-11-21 ENCOUNTER — Telehealth: Payer: Self-pay | Admitting: Family

## 2013-11-21 VITALS — BP 110/68 | HR 77 | Temp 97.6°F | Ht 67.0 in | Wt 265.5 lb

## 2013-11-21 DIAGNOSIS — Z5181 Encounter for therapeutic drug level monitoring: Secondary | ICD-10-CM

## 2013-11-21 DIAGNOSIS — L0390 Cellulitis, unspecified: Secondary | ICD-10-CM | POA: Insufficient documentation

## 2013-11-21 DIAGNOSIS — L03012 Cellulitis of left finger: Secondary | ICD-10-CM

## 2013-11-21 LAB — PROTIME-INR
INR: 2.1 ratio — AB (ref 0.8–1.0)
Prothrombin Time: 23.1 s — ABNORMAL HIGH (ref 9.6–13.1)

## 2013-11-21 MED ORDER — DOXYCYCLINE HYCLATE 100 MG PO TABS: 100.0000 mg | ORAL_TABLET | Freq: Two times a day (BID) | ORAL | Status: DC

## 2013-11-21 NOTE — Assessment & Plan Note (Signed)
For INR today, will need eventual r/u with coumadin clinic when active again

## 2013-11-21 NOTE — Telephone Encounter (Signed)
Please call patient and have her continue her current dose of coumadin as prescribed and follow up in 2 weeks.

## 2013-11-21 NOTE — Progress Notes (Signed)
Subjective:    Patient ID: Hannah Thompson, female    DOB: [birth year], 43 y.o.   MRN: [id number]  HPI Here to f/u, with tender painful mild swelling blistery with d/c rash to left hand distal 2nd,3rd, 4th fingers x 4-5 days, + hx of MRSA previously. No fever, red streaks or drainage.  Pt denies chest pain, increased sob or doe, wheezing, orthopnea, PND, increased LE swelling, palpitations, dizziness or syncope.   Pt denies polydipsia, polyuria, Tolerating coumadin well, no overt bleeding or bruising.  Due for INR but the office coumadiin clinic is not active at this time. Past Medical History  Diagnosis Date  . Headache(784.0) 06/21/2009    recurrent  . VITAMIN B12 DEFICIENCY 03/18/2009  . ASTHMA 03/17/2009  . ALLERGIC RHINITIS 03/17/2009  . ECZEMA 03/17/2009  . DISC DISEASE, LUMBAR 03/17/2009    mild facet arthropathy by MRI 2005  . HYPERLIPIDEMIA 03/17/2009  . ANEMIA-NOS 03/17/2009  . KELOID 06/21/2009   Past Surgical History  Procedure Laterality Date  . Keloid excision  mid 1990's    s/p keloid removal-laser     reports that she quit smoking about 5 years ago. Her smoking use included Cigarettes. She started smoking about 9 years ago. She has a 1 pack-year smoking history. She has never used smokeless tobacco. She reports that she does not drink alcohol or use illicit drugs. family history includes Alcohol abuse in her other; Allergies in her sister; Cancer in her father, other, and paternal aunt; Deep vein thrombosis in her sister; Diabetes in her mother and other; Heart disease in her other; Hypertension in her father and mother. Allergies  Allergen Reactions  . Oxaprozin     angioedema  . Sulfonamide Derivatives     REACTION: hives  . Orange Juice [Orange Oil] Hives    Orange juice  . Tomato Hives    tomatoes  . Nsaids Rash   Current Outpatient Prescriptions on File Prior to Visit  Medication Sig Dispense Refill  . acetaminophen (TYLENOL) 325 MG tablet Take 2 tablets (650 mg  total) by mouth every 6 (six) hours as needed for mild pain, moderate pain or headache (or Fever >/= 101).    . cyanocobalamin 2000 MCG tablet Take 2,000 mcg by mouth daily.    Marland Kitchen. desonide (DESOWEN) 0.05 % ointment Apply 1 application topically 2 (two) times daily.     Marland Kitchen. enoxaparin (LOVENOX) 100 MG/ML injection Inject 1 mL (100 mg total) into the skin daily. 10 mL 0  . EPINEPHrine (EPI-PEN) 0.3 mg/0.3 mL DEVI Inject 0.3 mg into the muscle daily as needed (Anaphylaxis).    . hydrOXYzine (ATARAX/VISTARIL) 25 MG tablet Take 25 mg by mouth 3 (three) times daily as needed for anxiety or itching.    . ranitidine (ZANTAC) 150 MG tablet Take 150 mg by mouth 2 (two) times daily.    . traMADol (ULTRAM) 50 MG tablet Take 1 tablet (50 mg total) by mouth at bedtime as needed. 30 tablet 0  . triamcinolone cream (KENALOG) 0.1 % Apply 1 application topically 2 (two) times daily.     Marland Kitchen. warfarin (COUMADIN) 5 MG tablet Take 1 tablet (5 mg total) by mouth daily. 90 tablet 0   No current facility-administered medications on file prior to visit.   Review of Systems  Constitutional: Negative for unusual diaphoresis or other sweats  HENT: Negative for ringing in ear Eyes: Negative for double vision or worsening visual disturbance.  Respiratory: Negative for choking and stridor.   Gastrointestinal:  Negative for vomiting or other signifcant bowel change Genitourinary: Negative for hematuria or decreased urine volume.  Musculoskeletal: Negative for other MSK pain or swelling Skin: Negative for color change and worsening wound.  Neurological: Negative for tremors and numbness other than noted  Psychiatric/Behavioral: Negative for decreased concentration or agitation other than above       Objective:   Physical Exam BP 110/68 mmHg  Pulse 77  Temp(Src) 97.6 F (36.4 C) (Oral)  Ht 5\' 7"  (1.702 m)  Wt 265 lb 8 oz (120.43 kg)  BMI 41.57 kg/m2  SpO2 99% VS noted,  Constitutional: Pt appears well-developed,  well-nourished.  HENT: Head: NCAT.  Right Ear: External ear normal.  Left Ear: External ear normal.  Eyes: . Pupils are equal, round, and reactive to light. Conjunctivae and EOM are normal Neck: Normal range of motion. Neck supple.  Cardiovascular: Normal rate and regular rhythm.   Pulmonary/Chest: Effort normal and breath sounds normal.  Abd:  Soft, NT, ND, + BS Neurological: Pt is alert. Not confused , motor grossly intact Skin: Skin is warm. + tender swelling pustular rash with mild erythema/tender/swelling to distal 2nd/3rd/4th fingers mostly medial aspects/kissing lesions , hand o/w neurovasc intact; Psychiatric: Pt behavior is normal. No agitation.     Assessment & Plan:

## 2013-11-21 NOTE — Progress Notes (Signed)
Pre visit review using our clinic review tool, if applicable. No additional management support is needed unless otherwise documented below in the visit note. 

## 2013-11-21 NOTE — Assessment & Plan Note (Addendum)
Higher suspicion recurrent staph infxn vs atypical eczema, has hx of MRSA, on coumadin currently - for doxy course,  to f/u any worsening symptoms or concerns

## 2013-11-21 NOTE — Patient Instructions (Signed)
Please take all new medication as prescribed - the antibiotic  Please continue all other medications as before, and refills have been done if requested.  Please have the pharmacy call with any other refills you may need.  Please continue your efforts at being more active, low cholesterol diet, and weight control.  Please keep your appointments with your specialists as you may have planned  Please go to the LAB in the Basement (turn left off the elevator) for the tests to be done today - the INR  You will be contacted by phone if any changes need to be made immediately.  Otherwise, you will receive a letter about your results with an explanation, but please check with MyChart first.  Please remember to sign up for MyChart if you have not done so, as this will be important to you in the future with finding out test results, communicating by private email, and scheduling acute appointments online when needed.

## 2013-11-21 NOTE — Telephone Encounter (Signed)
Called pt to let her know. She understood to take coumadin as prescribed and follow back up in 2 weeks.

## 2013-11-24 ENCOUNTER — Ambulatory Visit: Payer: No Typology Code available for payment source | Admitting: Family

## 2013-11-24 ENCOUNTER — Other Ambulatory Visit: Payer: No Typology Code available for payment source | Admitting: Lab

## 2013-11-25 ENCOUNTER — Ambulatory Visit (HOSPITAL_BASED_OUTPATIENT_CLINIC_OR_DEPARTMENT_OTHER): Payer: No Typology Code available for payment source | Admitting: Lab

## 2013-11-25 ENCOUNTER — Encounter: Payer: Self-pay | Admitting: Lab

## 2013-11-25 ENCOUNTER — Ambulatory Visit (HOSPITAL_BASED_OUTPATIENT_CLINIC_OR_DEPARTMENT_OTHER): Payer: No Typology Code available for payment source | Admitting: Family

## 2013-11-25 DIAGNOSIS — Z7901 Long term (current) use of anticoagulants: Secondary | ICD-10-CM

## 2013-11-25 DIAGNOSIS — I82491 Acute embolism and thrombosis of other specified deep vein of right lower extremity: Secondary | ICD-10-CM

## 2013-11-25 DIAGNOSIS — D696 Thrombocytopenia, unspecified: Secondary | ICD-10-CM

## 2013-11-25 LAB — CBC WITH DIFFERENTIAL (CANCER CENTER ONLY)
BASO#: 0 10*3/uL (ref 0.0–0.2)
BASO%: 0.3 % (ref 0.0–2.0)
EOS%: 4.5 % (ref 0.0–7.0)
Eosinophils Absolute: 0.2 10*3/uL (ref 0.0–0.5)
HEMATOCRIT: 35 % (ref 34.8–46.6)
HGB: 11.5 g/dL — ABNORMAL LOW (ref 11.6–15.9)
LYMPH#: 1.5 10*3/uL (ref 0.9–3.3)
LYMPH%: 37.9 % (ref 14.0–48.0)
MCH: 28.3 pg (ref 26.0–34.0)
MCHC: 32.9 g/dL (ref 32.0–36.0)
MCV: 86 fL (ref 81–101)
MONO#: 0.3 10*3/uL (ref 0.1–0.9)
MONO%: 7.5 % (ref 0.0–13.0)
NEUT#: 2 10*3/uL (ref 1.5–6.5)
NEUT%: 49.8 % (ref 39.6–80.0)
PLATELETS: 198 10*3/uL (ref 145–400)
RBC: 4.06 10*6/uL (ref 3.70–5.32)
RDW: 13.8 % (ref 11.1–15.7)
WBC: 4 10*3/uL (ref 3.9–10.0)

## 2013-11-25 NOTE — Progress Notes (Signed)
[facility]  [phone]) [phone] [phone]  ID: Hannah Thompson OB: 06-16-1970 MR#: [id number] ION#:[id number]#:[id number] Patient Care Team: Corwin LevinsJames W John, MD as PCP - General  DIAGNOSIS: Acute PE DVT in right lower extremity Thrombocytopenia  INTERVAL HISTORY: Hannah Thompson is here today for a follow-up. She is feeling much better. She is wearing her compression stocking most of the time and has no c/o pain or swelling.  She denies bleeding or pain. She denies fever, chills, n/v, cough, rash, headache, dizziness, SOB, chest pain, palpitations, abdominal pain, constipation, diarrhea, blood in urine or stool.  She denies swelling, tenderness, numbness and tingling in her extremities.  Her appetite is good and she is drinking plenty of fluids. Her weight is stable.  She had to switch to Coumadin because her insurance wouldn't cover Xarelto. She is doint well on the Coumadin and is being seen by the Coumadin clinic. Her INR on Friday was 2.1.   Overall she seems to be feeling much better.    CURRENT TREATMENT: Coumadin   REVIEW OF SYSTEMS: All other 10 point review of systems is negative.   PAST MEDICAL HISTORY: Past Medical History  Diagnosis Date  . Headache(784.0) 06/21/2009    recurrent  . VITAMIN B12 DEFICIENCY 03/18/2009  . ASTHMA 03/17/2009  . ALLERGIC RHINITIS 03/17/2009  . ECZEMA 03/17/2009  . DISC DISEASE, LUMBAR 03/17/2009    mild facet arthropathy by MRI 2005  . HYPERLIPIDEMIA 03/17/2009  . ANEMIA-NOS 03/17/2009  . KELOID 06/21/2009    PAST SURGICAL HISTORY: Past Surgical History  Procedure Laterality Date  . Keloid excision  mid 1990's    s/p keloid removal-laser     FAMILY HISTORY Family History  Problem Relation Age of Onset  . Cancer Father     Prostate Cancer  . Hypertension Father   . Allergies Sister   . Cancer Paternal Aunt     Ovarian Cancer  . Cancer Other     Grandparent-Lung Cancer  . Diabetes Other     Grandparent  . Heart disease Other      Grandparent  . Alcohol abuse Other     Several on both sides of family-3 uncles  . Hypertension Mother   . Diabetes Mother   . Deep vein thrombosis Sister     GYNECOLOGIC HISTORY:  No LMP recorded. Patient is not currently having periods (Reason: IUD).   SOCIAL HISTORY:  History   Social History Narrative   ADVANCED DIRECTIVES: <no information>  HEALTH MAINTENANCE: History  Substance Use Topics  . Smoking status: Former Smoker -- 0.25 packs/day for 4 years    Types: Cigarettes    Start date: 03/17/2004    Quit date: 04/17/2008  . Smokeless tobacco: Never Used     Comment: quit 5 years ago  . Alcohol Use: No     Comment: socially   Colonoscopy: PAP: Bone density: Lipid panel:  Allergies  Allergen Reactions  . Oxaprozin     angioedema  . Sulfonamide Derivatives     REACTION: hives  . Orange Juice [Orange Oil] Hives    Orange juice  . Tomato Hives    tomatoes  . Nsaids Rash    Current Outpatient Prescriptions  Medication Sig Dispense Refill  . acetaminophen (TYLENOL) 325 MG tablet Take 2 tablets (650 mg total) by mouth every 6 (six) hours as needed for mild pain, moderate pain or headache (or Fever >/= 101).    . cyanocobalamin 2000 MCG tablet Take 2,000 mcg by mouth daily.    .Marland Kitchen  desonide (DESOWEN) 0.05 % ointment Apply 1 application topically 2 (two) times daily.     Marland Kitchen. doxycycline (VIBRA-TABS) 100 MG tablet Take 1 tablet (100 mg total) by mouth 2 (two) times daily. (Patient taking differently: Take 100 mg by mouth 2 (two) times daily. D/C 12-01-13) 20 tablet 0  . enoxaparin (LOVENOX) 100 MG/ML injection Inject 1 mL (100 mg total) into the skin daily. 10 mL 0  . EPINEPHrine (EPI-PEN) 0.3 mg/0.3 mL DEVI Inject 0.3 mg into the muscle daily as needed (Anaphylaxis).    . hydrOXYzine (ATARAX/VISTARIL) 25 MG tablet Take 25 mg by mouth 3 (three) times daily as needed for anxiety or itching.    . ranitidine (ZANTAC) 150 MG tablet Take 150 mg by mouth 2 (two) times  daily.    . traMADol (ULTRAM) 50 MG tablet Take 1 tablet (50 mg total) by mouth at bedtime as needed. 30 tablet 0  . triamcinolone cream (KENALOG) 0.1 % Apply 1 application topically 2 (two) times daily.     Marland Kitchen. warfarin (COUMADIN) 5 MG tablet Take 1 tablet (5 mg total) by mouth daily. 90 tablet 0   No current facility-administered medications for this visit.    OBJECTIVE: There were no vitals filed for this visit. There is no weight on file to calculate BMI. ECOG FS:0 - Asymptomatic Ocular: Sclerae unicteric, pupils equal, round and reactive to light Ear-nose-throat: Oropharynx clear, dentition fair Lymphatic: No cervical or supraclavicular adenopathy Lungs no rales or rhonchi, good excursion bilaterally Heart regular rate and rhythm, no murmur appreciated Abd soft, nontender, positive bowel sounds MSK no focal spinal tenderness, no joint edema Neuro: non-focal, well-oriented, appropriate affect Breasts: Deferred  LAB RESULTS: CMP     Component Value Date/Time   NA 137 09/17/2013 1353   K 3.9 09/17/2013 1353   CL 103 09/17/2013 1353   CO2 23 09/17/2013 1353   GLUCOSE 101* 09/17/2013 1353   BUN 10 09/17/2013 1353   CREATININE 0.77 09/17/2013 1353   CALCIUM 9.3 09/17/2013 1353   PROT 7.1 09/14/2013 1904   ALBUMIN 3.5 09/14/2013 1904   AST 20 09/14/2013 1904   ALT 38* 09/14/2013 1904   ALKPHOS 61 09/14/2013 1904   BILITOT <0.2* 09/14/2013 1904   GFRNONAA >90 09/14/2013 1904   GFRAA >90 09/14/2013 1904   I No results found for: SPEP Lab Results  Component Value Date   WBC 4.0 11/25/2013   NEUTROABS 2.0 11/25/2013   HGB 11.5* 11/25/2013   HCT 35.0 11/25/2013   MCV 86 11/25/2013   PLT 198 11/25/2013   No results found for: LABCA2 No components found for: [id number]  Recent Labs Lab 11/21/13 1233  INR 2.1*    STUDIES: None  ASSESSMENT/PLAN: Hannah Thompson is a very pleasant 43 yo female with thrombocytopenia. Was recently hospitalized with a PE and right lower  extremity DVT. She is now on Coumadin because her insurance won't cover Xarelto and seems to be doing well with it. She is asymptomatic at this time. She is feeling much better.  We will continue with the Coumadin for now. We will see her back in 3 months for follow-up and labs.  We will also schedule her for repeat CT angio of chest and Doppler study of her right leg after Christmas.  She knows to call here with any questions or concerns and to go the ED in the event of an emergency. We can certainly see her sooner if need be.  Verdie MosherINCINNATI,SARAH M, NP 11/25/2013 10:27 AM

## 2013-11-26 ENCOUNTER — Telehealth: Payer: Self-pay | Admitting: *Deleted

## 2013-11-26 LAB — COMPREHENSIVE METABOLIC PANEL
ALBUMIN: 4 g/dL (ref 3.5–5.2)
ALT: 16 U/L (ref 0–35)
AST: 15 U/L (ref 0–37)
Alkaline Phosphatase: 69 U/L (ref 39–117)
BUN: 10 mg/dL (ref 6–23)
CALCIUM: 9 mg/dL (ref 8.4–10.5)
CHLORIDE: 104 meq/L (ref 96–112)
CO2: 27 meq/L (ref 19–32)
Creatinine, Ser: 0.6 mg/dL (ref 0.50–1.10)
Glucose, Bld: 62 mg/dL — ABNORMAL LOW (ref 70–99)
Potassium: 4.1 mEq/L (ref 3.5–5.3)
SODIUM: 138 meq/L (ref 135–145)
TOTAL PROTEIN: 7 g/dL (ref 6.0–8.3)
Total Bilirubin: 0.3 mg/dL (ref 0.2–1.2)

## 2013-11-26 LAB — D-DIMER, QUANTITATIVE (NOT AT ARMC): D DIMER QUANT: 2.29 ug{FEU}/mL — AB (ref 0.00–0.48)

## 2013-11-26 NOTE — Telephone Encounter (Signed)
Informed NP that pt's D-Dimer is 2.29. She is aware and she stated she will inform Dr Myna HidalgoEnnever.

## 2013-12-19 ENCOUNTER — Encounter: Payer: Self-pay | Admitting: Nurse Practitioner

## 2013-12-19 ENCOUNTER — Ambulatory Visit (INDEPENDENT_AMBULATORY_CARE_PROVIDER_SITE_OTHER): Payer: No Typology Code available for payment source | Admitting: Nurse Practitioner

## 2013-12-19 ENCOUNTER — Telehealth: Payer: Self-pay | Admitting: *Deleted

## 2013-12-19 VITALS — BP 133/82 | HR 64 | Temp 98.1°F | Resp 18 | Ht 67.0 in | Wt 265.0 lb

## 2013-12-19 DIAGNOSIS — T25021A Burn of unspecified degree of right foot, initial encounter: Secondary | ICD-10-CM

## 2013-12-19 DIAGNOSIS — Z23 Encounter for immunization: Secondary | ICD-10-CM

## 2013-12-19 NOTE — Progress Notes (Signed)
Pre visit review using our clinic review tool, if applicable. No additional management support is needed unless otherwise documented below in the visit note. 
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
## 2013-12-19 NOTE — Patient Instructions (Signed)
Keep wound clean: wash daily w/mild soap-Dove bar soap. Apply thin layer vaseline or antibiotic ointment. Keep covered w/loose gauze. Do not pop blister, if able.  If it pops, clean twice daily & keep covered.

## 2013-12-30 ENCOUNTER — Ambulatory Visit (HOSPITAL_BASED_OUTPATIENT_CLINIC_OR_DEPARTMENT_OTHER)
Admission: RE | Admit: 2013-12-30 | Discharge: 2013-12-30 | Disposition: A | Discharge status: Home / Self Care | Payer: No Typology Code available for payment source | Source: Ambulatory Visit | Attending: Family | Admitting: Family

## 2013-12-30 ENCOUNTER — Encounter (HOSPITAL_BASED_OUTPATIENT_CLINIC_OR_DEPARTMENT_OTHER): Payer: Self-pay

## 2013-12-30 DIAGNOSIS — I82491 Acute embolism and thrombosis of other specified deep vein of right lower extremity: Secondary | ICD-10-CM

## 2013-12-30 DIAGNOSIS — I2699 Other pulmonary embolism without acute cor pulmonale: Secondary | ICD-10-CM

## 2013-12-30 DIAGNOSIS — Z86711 Personal history of pulmonary embolism: Secondary | ICD-10-CM | POA: Diagnosis not present

## 2013-12-30 DIAGNOSIS — I82441 Acute embolism and thrombosis of right tibial vein: Secondary | ICD-10-CM | POA: Insufficient documentation

## 2013-12-30 MED ORDER — IOHEXOL 350 MG/ML SOLN
100.0000 mL | Freq: Once | INTRAVENOUS | Status: AC | PRN
  Administered 2013-12-30: 100 mL via INTRAVENOUS

## 2013-12-30 MED ORDER — IOHEXOL 300 MG/ML  SOLN: 100.0000 mL | Freq: Once | INTRAMUSCULAR | Status: AC | PRN

## 2014-02-03 ENCOUNTER — Telehealth: Payer: Self-pay | Admitting: Family

## 2014-02-03 ENCOUNTER — Encounter: Payer: Self-pay | Admitting: Internal Medicine

## 2014-02-03 ENCOUNTER — Ambulatory Visit (INDEPENDENT_AMBULATORY_CARE_PROVIDER_SITE_OTHER): Payer: 59 | Admitting: General Practice

## 2014-02-03 ENCOUNTER — Ambulatory Visit (INDEPENDENT_AMBULATORY_CARE_PROVIDER_SITE_OTHER): Payer: 59 | Admitting: Internal Medicine

## 2014-02-03 VITALS — BP 128/78 | HR 79 | Temp 98.0°F | Ht 67.0 in | Wt 270.0 lb

## 2014-02-03 DIAGNOSIS — I2699 Other pulmonary embolism without acute cor pulmonale: Secondary | ICD-10-CM

## 2014-02-03 DIAGNOSIS — Z0000 Encounter for general adult medical examination without abnormal findings: Secondary | ICD-10-CM

## 2014-02-03 DIAGNOSIS — M545 Low back pain, unspecified: Secondary | ICD-10-CM | POA: Insufficient documentation

## 2014-02-03 DIAGNOSIS — G8929 Other chronic pain: Secondary | ICD-10-CM

## 2014-02-03 DIAGNOSIS — Z7901 Long term (current) use of anticoagulants: Secondary | ICD-10-CM

## 2014-02-03 LAB — POCT INR: INR: 1.6

## 2014-02-03 MED ORDER — TRAMADOL HCL 50 MG PO TABS
50.0000 mg | ORAL_TABLET | Freq: Every evening | ORAL | Status: DC | PRN
Start: 2014-02-03 — End: 2015-01-13

## 2014-02-03 NOTE — Assessment & Plan Note (Signed)

## 2014-02-03 NOTE — Assessment & Plan Note (Signed)
To see coumadin clinic today for f/u INR

## 2014-02-03 NOTE — Telephone Encounter (Signed)
Agree with plan 

## 2014-02-03 NOTE — Patient Instructions (Signed)
Please continue all other medications as before, and refills have been done if requested - the tramadol  Please see the Coumadin clinic before leaving today  OK to try Benadryl cream as needed for the itching fingers, and see dermatology if not better  Please have the pharmacy call with any other refills you may need.  Please continue your efforts at being more active, low cholesterol diet, and weight control.  You are otherwise up to date with prevention measures today.  Please keep your appointments with your specialists as you may have planned  Please go to the LAB in the Basement (turn left off the elevator) for the tests to be done today  You will be contacted by phone if any changes need to be made immediately.  Otherwise, you will receive a letter about your results with an explanation, but please check with MyChart first.  Please remember to sign up for MyChart if you have not done so, as this will be important to you in the future with finding out test results, communicating by private email, and scheduling acute appointments online when needed.  Please return in 6 months, or sooner if needed

## 2014-02-03 NOTE — Progress Notes (Signed)
Subjective:    Patient ID: Hannah LightsWanda R Counts, female    DOB: 12-May-1970, 44 y.o.   MRN: [id number]  HPI  Here for wellness and f/u;  Overall doing ok;  Pt denies CP, worsening SOB, DOE, wheezing, orthopnea, PND, worsening LE edema, palpitations, dizziness or syncope.  Pt denies neurological change such as new headache, facial or extremity weakness.  Pt denies polydipsia, polyuria, or low sugar symptoms. Pt states overall good compliance with treatment and medications, good tolerability, and has been trying to follow lower cholesterol diet.  Pt denies worsening depressive symptoms, suicidal ideation or panic. No fever, night sweats, wt loss, loss of appetite, or other constitutional symptoms.  Pt states good ability with ADL's, has low fall risk, home safety reviewed and adequate, no other significant changes in hearing or vision, and only occasionally active with exercise.  Kenalog cr did not help for finger dermatitis.  Has also seen derm., has not tried benadryl cream.  Due for INR - has not had for several months, No overt bleeding. Pt continues to have recurring LBP without change in severity, bowel or bladder change, fever, wt loss,  worsening LE pain/numbness/weakness, gait change or falls. Wt Readings from Last 3 Encounters:  02/03/14 270 lb (122.471 kg)  12/19/13 265 lb (120.203 kg)  11/25/13 262 lb (118.842 kg)   Past Medical History  Diagnosis Date  . Headache(784.0) 06/21/2009    recurrent  . VITAMIN B12 DEFICIENCY 03/18/2009  . ASTHMA 03/17/2009  . ALLERGIC RHINITIS 03/17/2009  . ECZEMA 03/17/2009  . DISC DISEASE, LUMBAR 03/17/2009    mild facet arthropathy by MRI 2005  . HYPERLIPIDEMIA 03/17/2009  . ANEMIA-NOS 03/17/2009  . KELOID 06/21/2009   Past Surgical History  Procedure Laterality Date  . Keloid excision  mid 1990's    s/p keloid removal-laser     reports that she quit smoking about 5 years ago. Her smoking use included Cigarettes. She started smoking about 9 years ago. She has  a 1 pack-year smoking history. She has never used smokeless tobacco. She reports that she does not drink alcohol or use illicit drugs. family history includes Alcohol abuse in her other; Allergies in her sister; Cancer in her father, other, and paternal aunt; Deep vein thrombosis in her sister; Diabetes in her mother and other; Heart disease in her other; Hypertension in her father and mother. Allergies  Allergen Reactions  . Oxaprozin     angioedema  . Sulfonamide Derivatives     REACTION: hives  . Orange Juice [Orange Oil] Hives    Orange juice  . Tomato Hives    tomatoes  . Nsaids Rash   Current Outpatient Prescriptions on File Prior to Visit  Medication Sig Dispense Refill  . acetaminophen (TYLENOL) 325 MG tablet Take 2 tablets (650 mg total) by mouth every 6 (six) hours as needed for mild pain, moderate pain or headache (or Fever >/= 101).    . cyanocobalamin 2000 MCG tablet Take 2,000 mcg by mouth daily.    Marland Kitchen. desonide (DESOWEN) 0.05 % ointment Apply 1 application topically 2 (two) times daily.     Marland Kitchen. doxycycline (VIBRA-TABS) 100 MG tablet Take 1 tablet (100 mg total) by mouth 2 (two) times daily. 20 tablet 0  . enoxaparin (LOVENOX) 100 MG/ML injection Inject 1 mL (100 mg total) into the skin daily. 10 mL 0  . EPINEPHrine (EPI-PEN) 0.3 mg/0.3 mL DEVI Inject 0.3 mg into the muscle daily as needed (Anaphylaxis).    . hydrOXYzine (ATARAX/VISTARIL) 25  MG tablet Take 25 mg by mouth 3 (three) times daily as needed for anxiety or itching.    . ranitidine (ZANTAC) 150 MG tablet Take 150 mg by mouth 2 (two) times daily.    . traMADol (ULTRAM) 50 MG tablet Take 1 tablet (50 mg total) by mouth at bedtime as needed. 30 tablet 0  . triamcinolone cream (KENALOG) 0.1 % Apply 1 application topically 2 (two) times daily.     Marland Kitchen warfarin (COUMADIN) 5 MG tablet Take 1 tablet (5 mg total) by mouth daily. 90 tablet 0   No current facility-administered medications on file prior to visit.   Review of  Systems Constitutional: Negative for increased diaphoresis, other activity, appetite or other siginficant weight change  HENT: Negative for worsening hearing loss, ear pain, facial swelling, mouth sores and neck stiffness.   Eyes: Negative for other worsening pain, redness or visual disturbance.  Respiratory: Negative for shortness of breath and wheezing.   Cardiovascular: Negative for chest pain and palpitations.  Gastrointestinal: Negative for diarrhea, blood in stool, abdominal distention or other pain Genitourinary: Negative for hematuria, flank pain or change in urine volume.  Musculoskeletal: Negative for myalgias or other joint complaints.  Skin: Negative for color change and wound.  Neurological: Negative for syncope and numbness. other than noted Hematological: Negative for adenopathy. or other swelling Psychiatric/Behavioral: Negative for hallucinations, self-injury, decreased concentration or other worsening agitation.      Objective:   Physical Exam BP 128/78 mmHg  Pulse 79  Temp(Src) 98 F (36.7 C) (Oral)  Ht  (1.702 m)  Wt 270 lb (122.471 kg)  BMI 42.28 kg/m2  SpO2 99% VS noted,  Constitutional: Pt is oriented to person, place, and time. Appears well-developed and well-nourished.  Head: Normocephalic and atraumatic.  Right Ear: External ear normal.  Left Ear: External ear normal.  Nose: Nose normal.  Mouth/Throat: Oropharynx is clear and moist.  Eyes: Conjunctivae and EOM are normal. Pupils are equal, round, and reactive to light.  Neck: Normal range of motion. Neck supple. No JVD present. No tracheal deviation present.  Cardiovascular: Normal rate, regular rhythm, normal heart sounds and intact distal pulses.   Pulmonary/Chest: Effort normal and breath sounds without rales or wheezing  Abdominal: Soft. Bowel sounds are normal. NT. No HSM  Musculoskeletal: Normal range of motion. Exhibits no edema.  Lymphadenopathy:  Has no cervical adenopathy.  Neurological:  Pt is alert and oriented to person, place, and time. Pt has normal reflexes. No cranial nerve deficit. Motor grossly intact Skin: Skin is warm and dry. No rash noted.  Psychiatric:  Has normal mood and affect. Behavior is normal.      Assessment & Plan:

## 2014-02-03 NOTE — Progress Notes (Signed)
Pre visit review using our clinic review tool, if applicable. No additional management support is needed unless otherwise documented below in the visit note. 

## 2014-02-18 ENCOUNTER — Other Ambulatory Visit: Payer: No Typology Code available for payment source | Admitting: Lab

## 2014-02-18 ENCOUNTER — Ambulatory Visit: Payer: No Typology Code available for payment source | Admitting: Family

## 2014-02-24 ENCOUNTER — Ambulatory Visit (HOSPITAL_BASED_OUTPATIENT_CLINIC_OR_DEPARTMENT_OTHER): Payer: 59 | Admitting: Hematology & Oncology

## 2014-02-24 ENCOUNTER — Ambulatory Visit (INDEPENDENT_AMBULATORY_CARE_PROVIDER_SITE_OTHER): Payer: 59 | Admitting: General Practice

## 2014-02-24 ENCOUNTER — Ambulatory Visit: Payer: 59

## 2014-02-24 ENCOUNTER — Other Ambulatory Visit (HOSPITAL_BASED_OUTPATIENT_CLINIC_OR_DEPARTMENT_OTHER): Payer: 59 | Admitting: Lab

## 2014-02-24 ENCOUNTER — Encounter: Payer: Self-pay | Admitting: Hematology & Oncology

## 2014-02-24 VITALS — BP 115/69 | HR 54 | Temp 98.4°F | Resp 16 | Ht 67.0 in | Wt 275.0 lb

## 2014-02-24 DIAGNOSIS — I82491 Acute embolism and thrombosis of other specified deep vein of right lower extremity: Secondary | ICD-10-CM

## 2014-02-24 DIAGNOSIS — Z86718 Personal history of other venous thrombosis and embolism: Secondary | ICD-10-CM | POA: Diagnosis not present

## 2014-02-24 DIAGNOSIS — I2699 Other pulmonary embolism without acute cor pulmonale: Secondary | ICD-10-CM | POA: Diagnosis not present

## 2014-02-24 LAB — CBC WITH DIFFERENTIAL (CANCER CENTER ONLY)
BASO#: 0 10*3/uL (ref 0.0–0.2)
BASO%: 0.3 % (ref 0.0–2.0)
EOS ABS: 0.2 10*3/uL (ref 0.0–0.5)
EOS%: 5 % (ref 0.0–7.0)
HCT: 33.1 % — ABNORMAL LOW (ref 34.8–46.6)
HEMOGLOBIN: 11 g/dL — AB (ref 11.6–15.9)
LYMPH#: 1.6 10*3/uL (ref 0.9–3.3)
LYMPH%: 45.2 % (ref 14.0–48.0)
MCH: 28.1 pg (ref 26.0–34.0)
MCHC: 33.2 g/dL (ref 32.0–36.0)
MCV: 85 fL (ref 81–101)
MONO#: 0.3 10*3/uL (ref 0.1–0.9)
MONO%: 7.2 % (ref 0.0–13.0)
NEUT#: 1.5 10*3/uL (ref 1.5–6.5)
NEUT%: 42.3 % (ref 39.6–80.0)
Platelets: 201 10*3/uL (ref 145–400)
RBC: 3.91 10*6/uL (ref 3.70–5.32)
RDW: 14.5 % (ref 11.1–15.7)
WBC: 3.6 10*3/uL — ABNORMAL LOW (ref 3.9–10.0)

## 2014-02-24 LAB — CMP (CANCER CENTER ONLY)
ALK PHOS: 56 U/L (ref 26–84)
ALT: 19 U/L (ref 10–47)
AST: 21 U/L (ref 11–38)
Albumin: 3.3 g/dL (ref 3.3–5.5)
BUN: 9 mg/dL (ref 7–22)
CHLORIDE: 104 meq/L (ref 98–108)
CO2: 26 mEq/L (ref 18–33)
CREATININE: 0.6 mg/dL (ref 0.6–1.2)
Calcium: 8.8 mg/dL (ref 8.0–10.3)
Glucose, Bld: 98 mg/dL (ref 73–118)
Potassium: 4.2 mEq/L (ref 3.3–4.7)
Sodium: 144 mEq/L (ref 128–145)
Total Bilirubin: 0.5 mg/dl (ref 0.20–1.60)
Total Protein: 7.1 g/dL (ref 6.4–8.1)

## 2014-02-24 LAB — POCT INR: INR: 1.5

## 2014-02-24 NOTE — Progress Notes (Signed)
[facility]  [phone] [phone]  ID: Hannah Thompson OB: 1970-07-12 MR#: [id number] OZH#:[id number]#:[id number] Patient Care Team: Corwin LevinsJames W John, MD as PCP - General  DIAGNOSIS: Acute PE DVT in right lower extremity Thrombocytopenia  INTERVAL HISTORY: Hannah Thompson is here today for a follow-up. She is feeling much better. She is doing pretty well. She's not having much in way of leg pain. There's no cough or shortness of breath. She had a CT angiogram done at the end of December. There is no evidence of residual pulmonary embolism. She did have a Doppler of the right leg. This did show a residual popliteal vein thrombus.  Her right leg does swell up on occasion. 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  She's had no bleeding from the Coumadin.  She does have little bit of a rash. She wonders if this is from the Coumadin. I told her that I would not think so. She does have a history of eczema. She does take doxycycline for this.  CURRENT TREATMENT: Coumadin - 2 years of therapy. To be completed in September 2017  REVIEW OF SYSTEMS: All other 10 point review of systems is negative.   PAST MEDICAL HISTORY: Past Medical History  Diagnosis Date  . Headache(784.0) 06/21/2009    recurrent  . VITAMIN B12 DEFICIENCY 03/18/2009  . ASTHMA 03/17/2009  . ALLERGIC RHINITIS 03/17/2009  . ECZEMA 03/17/2009  . DISC DISEASE, LUMBAR 03/17/2009    mild facet arthropathy by MRI 2005  . HYPERLIPIDEMIA 03/17/2009  . ANEMIA-NOS 03/17/2009  . KELOID 06/21/2009    PAST SURGICAL HISTORY: Past Surgical History  Procedure Laterality Date  . Keloid excision  mid 1990's    s/p keloid removal-laser     FAMILY HISTORY Family History  Problem Relation Age of Onset  . Cancer Father     Prostate Cancer  . Hypertension Father   . Allergies Sister   . Cancer Paternal Aunt     Ovarian Cancer  . Cancer Other     Grandparent-Lung Cancer  . Diabetes Other     Grandparent  .  Heart disease Other     Grandparent  . Alcohol abuse Other     Several on both sides of family-3 uncles  . Hypertension Mother   . Diabetes Mother   . Deep vein thrombosis Sister     GYNECOLOGIC HISTORY:  No LMP recorded.   SOCIAL HISTORY:  History   Social History Narrative   ADVANCED DIRECTIVES: <no information>  HEALTH MAINTENANCE: History  Substance Use Topics  . Smoking status: Former Smoker -- 0.25 packs/day for 4 years    Types: Cigarettes    Start date: 03/17/2004    Quit date: 04/17/2008  . Smokeless tobacco: Never Used     Comment: quit 5 years ago  . Alcohol Use: No     Comment: socially   Colonoscopy: PAP: Bone density: Lipid panel:  Allergies  Allergen Reactions  . Oxaprozin     angioedema  . Sulfonamide Derivatives     REACTION: hives  . Orange Juice [Orange Oil] Hives    Orange juice  . Tomato Hives    tomatoes  . Nsaids Rash    Current Outpatient Prescriptions  Medication Sig Dispense Refill  . acetaminophen (TYLENOL) 325 MG tablet Take 2 tablets (650 mg total) by mouth every 6 (six) hours as needed for mild pain, moderate pain or headache (or Fever >/= 101).    .Marland Kitchen  cyanocobalamin 2000 MCG tablet Take 2,000 mcg by mouth daily.    Marland Kitchen desonide (DESOWEN) 0.05 % ointment Apply 1 application topically 2 (two) times daily.     Marland Kitchen EPINEPHrine (EPI-PEN) 0.3 mg/0.3 mL DEVI Inject 0.3 mg into the muscle daily as needed (Anaphylaxis).    . hydrOXYzine (ATARAX/VISTARIL) 25 MG tablet Take 25 mg by mouth 3 (three) times daily as needed for anxiety or itching.    . ranitidine (ZANTAC) 150 MG tablet Take 150 mg by mouth 2 (two) times daily.    . traMADol (ULTRAM) 50 MG tablet Take 1 tablet (50 mg total) by mouth at bedtime as needed. 90 tablet 1  . triamcinolone cream (KENALOG) 0.1 % Apply 1 application topically 2 (two) times daily.     Marland Kitchen warfarin (COUMADIN) 5 MG tablet Take 1 tablet (5 mg total) by mouth daily. 90 tablet 0   No current facility-administered  medications for this visit.    OBJECTIVE: Filed Vitals:   02/24/14 1009  BP: 115/69  Pulse: 54  Temp: 98.4 F (36.9 C)  Resp: 16   Body mass index is 43.06 kg/(m^2). ECOG FS:0 - Asymptomatic Ocular: Sclerae unicteric, pupils equal, round and reactive to light Ear-nose-throat: Oropharynx clear, dentition fair Lymphatic: No cervical or supraclavicular adenopathy Lungs no rales or rhonchi, good excursion bilaterally Heart regular rate and rhythm, no murmur appreciated Abd soft, nontender, positive bowel sounds MSK no focal spinal tenderness, no joint edema Neuro: non-focal, well-oriented, appropriate affect Breasts: Deferred  LAB RESULTS: CMP     Component Value Date/Time   NA 144 02/24/2014 0922   NA 138 11/25/2013 0959   K 4.2 02/24/2014 0922   K 4.1 11/25/2013 0959   CL 104 02/24/2014 0922   CL 104 11/25/2013 0959   CO2 26 02/24/2014 0922   CO2 27 11/25/2013 0959   GLUCOSE 98 02/24/2014 0922   GLUCOSE 62* 11/25/2013 0959   BUN 9 02/24/2014 0922   BUN 10 11/25/2013 0959   CREATININE 0.6 02/24/2014 0922   CREATININE 0.60 11/25/2013 0959   CALCIUM 8.8 02/24/2014 0922   CALCIUM 9.0 11/25/2013 0959   PROT 7.1 02/24/2014 0922   PROT 7.0 11/25/2013 0959   ALBUMIN 4.0 11/25/2013 0959   AST 21 02/24/2014 0922   AST 15 11/25/2013 0959   ALT 19 02/24/2014 0922   ALT 16 11/25/2013 0959   ALKPHOS 56 02/24/2014 0922   ALKPHOS 69 11/25/2013 0959   BILITOT 0.50 02/24/2014 0922   BILITOT 0.3 11/25/2013 0959   GFRNONAA >90 09/14/2013 1904   GFRAA >90 09/14/2013 1904   I No results found for: SPEP Lab Results  Component Value Date   WBC 3.6* 02/24/2014   NEUTROABS 1.5 02/24/2014   HGB 11.0* 02/24/2014   HCT 33.1* 02/24/2014   MCV 85 02/24/2014   PLT 201 02/24/2014   No results found for: LABCA2 No components found for: LABCA125 No results for input(s): INR in the last 168 hours.  STUDIES: None  ASSESSMENT/PLAN: Hannah Thompson is a very pleasant 44 year old  African-American female. She has a normal hypercoagulable panel. However, there is a strong family history of thromboembolic disease.  She presented with a pulmonary embolus and a right leg thrombus back in September.  I believe that she will need to years of anticoagulation.  I probably would repeat a Doppler of her right leg in September.  I think we probably see her back in 4 months to see how she is doing.   Josph Macho, MD 02/24/2014  11:49 AM

## 2014-02-24 NOTE — Progress Notes (Signed)
Pre visit review using our clinic review tool, if applicable. No additional management support is needed unless otherwise documented below in the visit note. 

## 2014-02-25 LAB — D-DIMER, QUANTITATIVE (NOT AT ARMC): D DIMER QUANT: 0.27 ug{FEU}/mL (ref 0.00–0.48)

## 2014-03-10 ENCOUNTER — Other Ambulatory Visit: Payer: Self-pay | Admitting: General Practice

## 2014-03-10 ENCOUNTER — Ambulatory Visit (INDEPENDENT_AMBULATORY_CARE_PROVIDER_SITE_OTHER): Payer: 59 | Admitting: General Practice

## 2014-03-10 DIAGNOSIS — I2699 Other pulmonary embolism without acute cor pulmonale: Secondary | ICD-10-CM

## 2014-03-10 DIAGNOSIS — Z7901 Long term (current) use of anticoagulants: Secondary | ICD-10-CM

## 2014-03-10 LAB — POCT INR: INR: 1.8

## 2014-03-10 MED ORDER — WARFARIN SODIUM 5 MG PO TABS: ORAL_TABLET | ORAL | Status: DC

## 2014-03-10 NOTE — Progress Notes (Signed)
Pre visit review using our clinic review tool, if applicable. No additional management support is needed unless otherwise documented below in the visit note. 

## 2014-03-10 NOTE — Progress Notes (Signed)
Agree with plan 

## 2014-03-31 ENCOUNTER — Ambulatory Visit (INDEPENDENT_AMBULATORY_CARE_PROVIDER_SITE_OTHER): Payer: 59 | Admitting: General Practice

## 2014-03-31 ENCOUNTER — Ambulatory Visit: Payer: 59

## 2014-03-31 DIAGNOSIS — I2699 Other pulmonary embolism without acute cor pulmonale: Secondary | ICD-10-CM

## 2014-03-31 DIAGNOSIS — Z7901 Long term (current) use of anticoagulants: Secondary | ICD-10-CM | POA: Diagnosis not present

## 2014-03-31 LAB — POCT INR: INR: 2.4

## 2014-03-31 NOTE — Progress Notes (Signed)
Agree with plan 

## 2014-03-31 NOTE — Progress Notes (Signed)
Pre visit review using our clinic review tool, if applicable. No additional management support is needed unless otherwise documented below in the visit note. 

## 2014-04-02 ENCOUNTER — Telehealth: Payer: Self-pay | Admitting: Internal Medicine

## 2014-04-02 ENCOUNTER — Other Ambulatory Visit: Payer: Self-pay

## 2014-04-02 ENCOUNTER — Other Ambulatory Visit: Payer: Self-pay | Admitting: General Practice

## 2014-04-02 MED ORDER — WARFARIN SODIUM 5 MG PO TABS: ORAL_TABLET | ORAL | Status: DC

## 2014-04-02 NOTE — Telephone Encounter (Signed)
Coumadin refills are normally done by coumadin clinic -

## 2014-04-02 NOTE — Telephone Encounter (Signed)
Patient is out of town. Left warfarin (COUMADIN) 5 MG tablet [[id number]][[id number]] at home. Asks that we send an RX [facility] [place], [place], [place]

## 2014-04-02 NOTE — Telephone Encounter (Signed)
She currently has a local RX for 105 tablets which is a 90 day supply, which does not add up. I was going to send a 30 day supply but her instructions are not clear.

## 2014-04-02 NOTE — Telephone Encounter (Signed)
Pt. Is out of town and needs a 4 day supply until she can return home. I will send a 7 day supply just in case.

## 2014-04-22 ENCOUNTER — Ambulatory Visit: Payer: No Typology Code available for payment source | Admitting: Internal Medicine

## 2014-04-28 ENCOUNTER — Ambulatory Visit (INDEPENDENT_AMBULATORY_CARE_PROVIDER_SITE_OTHER): Payer: 59 | Admitting: General Practice

## 2014-04-28 ENCOUNTER — Encounter: Payer: Self-pay | Admitting: Internal Medicine

## 2014-04-28 ENCOUNTER — Ambulatory Visit (INDEPENDENT_AMBULATORY_CARE_PROVIDER_SITE_OTHER): Payer: 59 | Admitting: Internal Medicine

## 2014-04-28 VITALS — BP 118/70 | HR 77 | Temp 98.3°F | Resp 20 | Ht 67.0 in | Wt 275.1 lb

## 2014-04-28 DIAGNOSIS — M79601 Pain in right arm: Secondary | ICD-10-CM

## 2014-04-28 DIAGNOSIS — J309 Allergic rhinitis, unspecified: Secondary | ICD-10-CM | POA: Diagnosis not present

## 2014-04-28 DIAGNOSIS — J4520 Mild intermittent asthma, uncomplicated: Secondary | ICD-10-CM

## 2014-04-28 DIAGNOSIS — R600 Localized edema: Secondary | ICD-10-CM | POA: Insufficient documentation

## 2014-04-28 DIAGNOSIS — I2699 Other pulmonary embolism without acute cor pulmonale: Secondary | ICD-10-CM

## 2014-04-28 DIAGNOSIS — Z7901 Long term (current) use of anticoagulants: Secondary | ICD-10-CM | POA: Diagnosis not present

## 2014-04-28 LAB — POCT INR: INR: 1.2

## 2014-04-28 MED ORDER — TRIAMCINOLONE ACETONIDE 55 MCG/ACT NA AERO: 2.0000 | INHALATION_SPRAY | Freq: Every day | NASAL | Status: DC

## 2014-04-28 MED ORDER — CETIRIZINE HCL 10 MG PO TABS: 10.0000 mg | ORAL_TABLET | Freq: Every day | ORAL | Status: DC

## 2014-04-28 MED ORDER — HYDROCHLOROTHIAZIDE 12.5 MG PO CAPS: 12.5000 mg | ORAL_CAPSULE | Freq: Every day | ORAL | Status: DC

## 2014-04-28 MED ORDER — METHYLPREDNISOLONE ACETATE 80 MG/ML IJ SUSP
80.0000 mg | Freq: Once | INTRAMUSCULAR | Status: AC
Start: 2014-04-28 — End: 2014-04-28
  Administered 2014-04-28: 80 mg via INTRAMUSCULAR

## 2014-04-28 NOTE — Assessment & Plan Note (Signed)
For hct 12.5 qd prn,  to f/u any worsening symptoms or concerns

## 2014-04-28 NOTE — Progress Notes (Signed)
Pre visit review using our clinic review tool, if applicable. No additional management support is needed unless otherwise documented below in the visit note. 

## 2014-04-28 NOTE — Progress Notes (Signed)
Agree with plan 

## 2014-04-28 NOTE — Progress Notes (Signed)
Subjective:    Patient ID: Hannah Thompson, female    DOB: 12-27-1970, 44 y.o.   MRN: [id number]  HPI  Here to f/u; overall doing ok,  Pt denies chest pain, increasing sob or doe, wheezing, orthopnea, PND, increased LE swelling, palpitations, dizziness or syncope, except for bipedal edema intermittent.  Pt denies new neurological symptoms such as new headache, or facial or extremity weakness or numbness.  Pt denies polydipsia, polyuria, or low sugar episode.   Pt denies new neurological symptoms such as new headache, or facial or extremity weakness or numbness.   Pt states overall good compliance with meds, mostly trying to follow appropriate diet, with wt overall stable,  but little exercise however. Does have several wks ongoing nasal allergy symptoms with clearish congestion, itch and sneezing, without fever, pain, ST, cough, swelling or wheezing. Past Medical History  Diagnosis Date  . Headache(784.0) 06/21/2009    recurrent  . VITAMIN B12 DEFICIENCY 03/18/2009  . ASTHMA 03/17/2009  . ALLERGIC RHINITIS 03/17/2009  . ECZEMA 03/17/2009  . DISC DISEASE, LUMBAR 03/17/2009    mild facet arthropathy by MRI 2005  . HYPERLIPIDEMIA 03/17/2009  . ANEMIA-NOS 03/17/2009  . KELOID 06/21/2009   Past Surgical History  Procedure Laterality Date  . Keloid excision  mid 1990's    s/p keloid removal-laser     reports that she quit smoking about 6 years ago. Her smoking use included Cigarettes. She started smoking about 10 years ago. She has a 1 pack-year smoking history. She has never used smokeless tobacco. She reports that she does not drink alcohol or use illicit drugs. family history includes Alcohol abuse in her other; Allergies in her sister; Cancer in her father, other, and paternal aunt; Deep vein thrombosis in her sister; Diabetes in her mother and other; Heart disease in her other; Hypertension in her father and mother. Allergies  Allergen Reactions  . Oxaprozin     angioedema  . Sulfonamide  Derivatives     REACTION: hives  . Orange Juice [Orange Oil] Hives    Orange juice  . Tomato Hives    tomatoes  . Nsaids Rash   Current Outpatient Prescriptions on File Prior to Visit  Medication Sig Dispense Refill  . acetaminophen (TYLENOL) 325 MG tablet Take 2 tablets (650 mg total) by mouth every 6 (six) hours as needed for mild pain, moderate pain or headache (or Fever >/= 101).    . cyanocobalamin 2000 MCG tablet Take 2,000 mcg by mouth daily.    Marland Kitchen desonide (DESOWEN) 0.05 % ointment Apply 1 application topically 2 (two) times daily.     Marland Kitchen EPINEPHrine (EPI-PEN) 0.3 mg/0.3 mL DEVI Inject 0.3 mg into the muscle daily as needed (Anaphylaxis).    . hydrOXYzine (ATARAX/VISTARIL) 25 MG tablet Take 25 mg by mouth 3 (three) times daily as needed for anxiety or itching.    . ranitidine (ZANTAC) 150 MG tablet Take 150 mg by mouth 2 (two) times daily.    . traMADol (ULTRAM) 50 MG tablet Take 1 tablet (50 mg total) by mouth at bedtime as needed. 90 tablet 1  . triamcinolone cream (KENALOG) 0.1 % Apply 1 application topically 2 (two) times daily.     Marland Kitchen warfarin (COUMADIN) 5 MG tablet Take as directed by anticoagulation clinic 35 tablet 0   No current facility-administered medications on file prior to visit.   Review of Systems  Constitutional: Negative for unusual diaphoresis or night sweats HENT: Negative for ringing in ear or discharge Eyes:  Negative for double vision or worsening visual disturbance.  Respiratory: Negative for choking and stridor.   Gastrointestinal: Negative for vomiting or other signifcant bowel change Genitourinary: Negative for hematuria or change in urine volume.  Musculoskeletal: Negative for other MSK pain or swelling Skin: Negative for color change and worsening wound.  Neurological: Negative for tremors and numbness other than noted  Psychiatric/Behavioral: Negative for decreased concentration or agitation other than above       Objective:   Physical Exam BP  118/70 mmHg  Pulse 77  Temp(Src) 98.3 F (36.8 C) (Oral)  Resp 20  Ht 5\' 7"  (1.702 m)  Wt 275 lb 1.3 oz (124.775 kg)  BMI 43.07 kg/m2  SpO2 97% BP 118/70 mmHg  Pulse 77  Temp(Src) 98.3 F (36.8 C) (Oral)  Resp 20  Ht 5\' 7"  (1.702 m)  Wt 275 lb 1.3 oz (124.775 kg)  BMI 43.07 kg/m2  SpO2 97% VS noted,  Constitutional: Pt appears in no significant distress HENT: Head: NCAT.  Right Ear: External ear normal.  Left Ear: External ear normal.  Eyes: . Pupils are equal, round, and reactive to light. Conjunctivae and EOM are normal Neck: Normal range of motion. Neck supple.  Cardiovascular: Normal rate and regular rhythm.   Pulmonary/Chest: Effort normal and breath sounds without rales or wheezing.  Abd:  Soft, NT, ND, + BS Neurological: Pt is alert. Not confused , motor grossly intact Skin: Skin is warm. No rash, Psychiatric: Pt behavior is normal. No agitation.  Trace to 1+ bilat pedal edema Tender right lateral epidondylar - mild, with mild puffiness    Assessment & Plan:

## 2014-04-28 NOTE — Patient Instructions (Signed)
You had the steroid shot today  Please take all new medication as prescribed - the zyrtec and nasacort for allergies, and the mild fluid pill for swelling if needed  Please consider see Dr Katrinka BlazingSmith in this office if the right arm problem comes back  Please continue all other medications as before, and refills have been done if requested.  Please have the pharmacy call with any other refills you may need.  Please keep your appointments with your specialists as you may have planned

## 2014-04-28 NOTE — Assessment & Plan Note (Signed)
stable overall by history and exam, recent data reviewed with pt, and pt to continue medical treatment as before,  to f/u any worsening symptoms or concerns SpO2 Readings from Last 3 Encounters:  04/28/14 97%  02/03/14 99%  12/19/13 99%

## 2014-04-28 NOTE — Assessment & Plan Note (Signed)
Mild to mod, for depomedrol IM, zyrtec/nasacort,  to f/u any worsening symptoms or concerns

## 2014-04-28 NOTE — Assessment & Plan Note (Signed)
C/w mild lateral epicondylitis, to ovoid overuse, tylenol prn, consider seeing sport medicine for worsening

## 2014-05-05 ENCOUNTER — Ambulatory Visit: Payer: 59

## 2014-05-12 ENCOUNTER — Ambulatory Visit (INDEPENDENT_AMBULATORY_CARE_PROVIDER_SITE_OTHER): Payer: 59 | Admitting: General Practice

## 2014-05-12 DIAGNOSIS — I2699 Other pulmonary embolism without acute cor pulmonale: Secondary | ICD-10-CM

## 2014-05-12 DIAGNOSIS — Z7901 Long term (current) use of anticoagulants: Secondary | ICD-10-CM | POA: Diagnosis not present

## 2014-05-12 LAB — POCT INR: INR: 3.5

## 2014-05-12 NOTE — Progress Notes (Signed)
Pre visit review using our clinic review tool, if applicable. No additional management support is needed unless otherwise documented below in the visit note. 

## 2014-05-13 NOTE — Progress Notes (Signed)
Agree with plan 

## 2014-06-09 ENCOUNTER — Ambulatory Visit (INDEPENDENT_AMBULATORY_CARE_PROVIDER_SITE_OTHER): Payer: 59 | Admitting: *Deleted

## 2014-06-09 DIAGNOSIS — I2699 Other pulmonary embolism without acute cor pulmonale: Secondary | ICD-10-CM

## 2014-06-09 DIAGNOSIS — Z7901 Long term (current) use of anticoagulants: Secondary | ICD-10-CM

## 2014-06-09 LAB — POCT INR: INR: 1.4

## 2014-06-09 NOTE — Progress Notes (Signed)
I have reviewed and agree with the plan. 

## 2014-06-09 NOTE — Progress Notes (Signed)
Pre visit review using our clinic review tool, if applicable. No additional management support is needed unless otherwise documented below in the visit note. 

## 2014-06-23 ENCOUNTER — Ambulatory Visit: Payer: 59 | Admitting: Family

## 2014-06-23 ENCOUNTER — Other Ambulatory Visit: Payer: 59

## 2014-06-30 ENCOUNTER — Other Ambulatory Visit (HOSPITAL_BASED_OUTPATIENT_CLINIC_OR_DEPARTMENT_OTHER): Payer: 59

## 2014-06-30 ENCOUNTER — Ambulatory Visit (HOSPITAL_BASED_OUTPATIENT_CLINIC_OR_DEPARTMENT_OTHER): Payer: 59 | Admitting: Family

## 2014-06-30 ENCOUNTER — Other Ambulatory Visit: Payer: Self-pay | Admitting: *Deleted

## 2014-06-30 DIAGNOSIS — J4520 Mild intermittent asthma, uncomplicated: Secondary | ICD-10-CM

## 2014-06-30 DIAGNOSIS — Z7901 Long term (current) use of anticoagulants: Secondary | ICD-10-CM

## 2014-06-30 DIAGNOSIS — I2699 Other pulmonary embolism without acute cor pulmonale: Secondary | ICD-10-CM

## 2014-06-30 DIAGNOSIS — I82491 Acute embolism and thrombosis of other specified deep vein of right lower extremity: Secondary | ICD-10-CM

## 2014-06-30 LAB — CBC WITH DIFFERENTIAL (CANCER CENTER ONLY)
BASO#: 0 10*3/uL (ref 0.0–0.2)
BASO%: 0.2 % (ref 0.0–2.0)
EOS%: 3.8 % (ref 0.0–7.0)
Eosinophils Absolute: 0.2 10*3/uL (ref 0.0–0.5)
HEMATOCRIT: 34.2 % — AB (ref 34.8–46.6)
HGB: 11.5 g/dL — ABNORMAL LOW (ref 11.6–15.9)
LYMPH#: 2 10*3/uL (ref 0.9–3.3)
LYMPH%: 46.8 % (ref 14.0–48.0)
MCH: 28.5 pg (ref 26.0–34.0)
MCHC: 33.6 g/dL (ref 32.0–36.0)
MCV: 85 fL (ref 81–101)
MONO#: 0.3 10*3/uL (ref 0.1–0.9)
MONO%: 6.2 % (ref 0.0–13.0)
NEUT#: 1.8 10*3/uL (ref 1.5–6.5)
NEUT%: 43 % (ref 39.6–80.0)
Platelets: 182 10*3/uL (ref 145–400)
RBC: 4.04 10*6/uL (ref 3.70–5.32)
RDW: 14.6 % (ref 11.1–15.7)
WBC: 4.2 10*3/uL (ref 3.9–10.0)

## 2014-06-30 LAB — D-DIMER, QUANTITATIVE: D-Dimer, Quant: 0.3 ug/mL-FEU (ref 0.00–0.48)

## 2014-06-30 MED ORDER — EPINEPHRINE 0.3 MG/0.3ML IJ SOAJ: 0.3000 mg | Freq: Once | INTRAMUSCULAR | Status: DC

## 2014-06-30 NOTE — Progress Notes (Signed)
Hematology and Oncology Follow Up Visit  Hannah Thompson [id number] 1970/02/22 44 y.o. 06/30/2014   Principle Diagnosis:  Acute PE DVT in right lower extremity Thrombocytopenia  Current Therapy:   Coumadin - 2 years of therapy - will complete in September 2017    Interim History:  Hannah Thompson is here today for a follow-up. She is doing well on Coumadin and has had no episodes of bleeding or bruising. She has her INR checked regularly at the Coumadin clinic. She will finish 2 years of anticoagulation in September 2017.  A CT angio in December showed resolution of pulmonary emboli. An US that same day showed a DVT of the right posterior tibial vein.  She has had no SOB or cough. She denies swelling or pain in the right leg. She has a compression stocking but does not wear it often.  She denies fever, chills, n/v, rash, dizziness, blurred vision, chest pain, palpitations, abdominal pain, constipation, diarrhea, blood in urine or stool.  No numbness or tingling in her extremities. No new aches or pains.  She is eating well and staying hydrated. Her weight is stable.  She plans to start eating healthier and exercising to lose weight soon.   Medications:    Medication List       This list is accurate as of: 06/30/14 11:42 AM.  Always use your most recent med list.               acetaminophen 325 MG tablet  Commonly known as:  TYLENOL  Take 2 tablets (650 mg total) by mouth every 6 (six) hours as needed for mild pain, moderate pain or headache (or Fever >/= 101).     cetirizine 10 MG tablet  Commonly known as:  ZYRTEC  Take 1 tablet (10 mg total) by mouth daily.     cyanocobalamin 2000 MCG tablet  Take 2,000 mcg by mouth daily.     desonide 0.05 % ointment  Commonly known as:  DESOWEN  Apply 1 application topically 2 (two) times daily.     EPINEPHrine 0.3 mg/0.3 mL Devi  Commonly known as:  EPI-PEN  Inject 0.3 mg into the muscle daily as needed (Anaphylaxis).     hydrochlorothiazide 12.5 MG capsule  Commonly known as:  MICROZIDE  Take 1 capsule (12.5 mg total) by mouth daily.     hydrOXYzine 25 MG tablet  Commonly known as:  ATARAX/VISTARIL  Take 25 mg by mouth 3 (three) times daily as needed for anxiety or itching.     ranitidine 150 MG tablet  Commonly known as:  ZANTAC  Take 150 mg by mouth 2 (two) times daily.     traMADol 50 MG tablet  Commonly known as:  ULTRAM  Take 1 tablet (50 mg total) by mouth at bedtime as needed.     triamcinolone 55 MCG/ACT Aero nasal inhaler  Commonly known as:  NASACORT AQ  Place 2 sprays into the nose daily.     triamcinolone cream 0.1 %  Commonly known as:  KENALOG  Apply 1 application topically 2 (two) times daily.     warfarin 5 MG tablet  Commonly known as:  COUMADIN  Take as directed by anticoagulation clinic        Allergies:  Allergies  Allergen Reactions  . Oxaprozin     angioedema  . Sulfonamide Derivatives     REACTION: hives  . Orange Juice [Orange Oil] Hives    Orange juice  . Tomato Hives  tomatoes  . Nsaids Rash    Past Medical History, Surgical history, Social history, and Family History were reviewed and updated.  Review of Systems: All other 10 point review of systems is negative.   Physical Exam:  vitals were not taken for this visit.  Wt Readings from Last 3 Encounters:  04/28/14 275 lb 1.3 oz (124.775 kg)  02/24/14 275 lb (124.739 kg)  02/03/14 270 lb (122.471 kg)    Ocular: Sclerae unicteric, pupils equal, round and reactive to light Ear-nose-throat: Oropharynx clear, dentition fair Lymphatic: No cervical or supraclavicular adenopathy Lungs no rales or rhonchi, good excursion bilaterally Heart regular rate and rhythm, no murmur appreciated Abd soft, nontender, positive bowel sounds MSK no focal spinal tenderness, no joint edema Neuro: non-focal, well-oriented, appropriate affect Breasts: Deferred  Lab Results  Component Value Date   WBC 4.2  06/30/2014   HGB 11.5* 06/30/2014   HCT 34.2* 06/30/2014   MCV 85 06/30/2014   PLT 182 06/30/2014   Lab Results  Component Value Date   IRON 51 03/17/2009   IRONPCTSAT 17.2* 03/17/2009   Lab Results  Component Value Date   RBC 4.04 06/30/2014   No results found for: KPAFRELGTCHN, LAMBDASER, KAPLAMBRATIO No results found for: IGGSERUM, IGA, IGMSERUM No results found for: Marda Stalker, SPEI   Chemistry      Component Value Date/Time   NA 144 02/24/2014 0922   NA 138 11/25/2013 0959   K 4.2 02/24/2014 0922   K 4.1 11/25/2013 0959   CL 104 02/24/2014 0922   CL 104 11/25/2013 0959   CO2 26 02/24/2014 0922   CO2 27 11/25/2013 0959   BUN 9 02/24/2014 0922   BUN 10 11/25/2013 0959   CREATININE 0.6 02/24/2014 0922   CREATININE 0.60 11/25/2013 0959      Component Value Date/Time   CALCIUM 8.8 02/24/2014 0922   CALCIUM 9.0 11/25/2013 0959   ALKPHOS 56 02/24/2014 0922   ALKPHOS 69 11/25/2013 0959   AST 21 02/24/2014 0922   AST 15 11/25/2013 0959   ALT 19 02/24/2014 0922   ALT 16 11/25/2013 0959   BILITOT 0.50 02/24/2014 0922   BILITOT 0.3 11/25/2013 0959     Impression and Plan: Hannah Thompson is a very pleasant 44 year old African-American female pulmonary embolus and a right leg thrombus diagnosed in September 2015. She is currently on Coumadin and doing well. The PE has resolved but she still had a residual clot in the right tibial vein.  She will on anticoagulation for 2 years.  We will repeat her [place] at her next visit.   We will see her back in 4 months for labs and follow-up.  She knows to contact us with any questions or concerns. We can certainly see her sooner if need be.   Verdie Mosher, NP 6/28/201611:42 AM

## 2014-07-07 ENCOUNTER — Ambulatory Visit: Payer: 59

## 2014-07-07 ENCOUNTER — Ambulatory Visit (INDEPENDENT_AMBULATORY_CARE_PROVIDER_SITE_OTHER): Payer: 59 | Admitting: General Practice

## 2014-07-07 DIAGNOSIS — Z5181 Encounter for therapeutic drug level monitoring: Secondary | ICD-10-CM | POA: Diagnosis not present

## 2014-07-07 DIAGNOSIS — Z7901 Long term (current) use of anticoagulants: Secondary | ICD-10-CM | POA: Diagnosis not present

## 2014-07-07 DIAGNOSIS — I2699 Other pulmonary embolism without acute cor pulmonale: Secondary | ICD-10-CM | POA: Diagnosis not present

## 2014-07-07 LAB — POCT INR: INR: 2.9

## 2014-07-07 NOTE — Progress Notes (Signed)
I have reviewed and agree with the plan. 

## 2014-07-07 NOTE — Progress Notes (Signed)
Pre visit review using our clinic review tool, if applicable. No additional management support is needed unless otherwise documented below in the visit note. 

## 2014-07-20 ENCOUNTER — Telehealth: Payer: Self-pay | Admitting: Internal Medicine

## 2014-08-04 ENCOUNTER — Other Ambulatory Visit: Payer: Self-pay | Admitting: General Practice

## 2014-08-04 ENCOUNTER — Ambulatory Visit (INDEPENDENT_AMBULATORY_CARE_PROVIDER_SITE_OTHER): Payer: 59 | Admitting: General Practice

## 2014-08-04 DIAGNOSIS — I2699 Other pulmonary embolism without acute cor pulmonale: Secondary | ICD-10-CM | POA: Diagnosis not present

## 2014-08-04 DIAGNOSIS — Z7901 Long term (current) use of anticoagulants: Secondary | ICD-10-CM

## 2014-08-04 DIAGNOSIS — Z5181 Encounter for therapeutic drug level monitoring: Secondary | ICD-10-CM

## 2014-08-04 LAB — POCT INR: INR: 2.8

## 2014-08-04 MED ORDER — WARFARIN SODIUM 5 MG PO TABS: ORAL_TABLET | ORAL | Status: DC

## 2014-08-04 NOTE — Progress Notes (Signed)
Pre visit review using our clinic review tool, if applicable. No additional management support is needed unless otherwise documented below in the visit note. 

## 2014-08-04 NOTE — Progress Notes (Signed)
I have reviewed and agree with the plan. 

## 2014-09-02 ENCOUNTER — Ambulatory Visit (INDEPENDENT_AMBULATORY_CARE_PROVIDER_SITE_OTHER): Payer: 59 | Admitting: General Practice

## 2014-09-02 DIAGNOSIS — I2699 Other pulmonary embolism without acute cor pulmonale: Secondary | ICD-10-CM

## 2014-09-02 DIAGNOSIS — Z5181 Encounter for therapeutic drug level monitoring: Secondary | ICD-10-CM

## 2014-09-02 DIAGNOSIS — Z7901 Long term (current) use of anticoagulants: Secondary | ICD-10-CM

## 2014-09-02 LAB — POCT INR: INR: 4.7

## 2014-09-02 NOTE — Progress Notes (Signed)
Pre visit review using our clinic review tool, if applicable. No additional management support is needed unless otherwise documented below in the visit note. 

## 2014-09-03 NOTE — Progress Notes (Signed)
I have reviewed and agree with the plan. 

## 2014-09-30 ENCOUNTER — Ambulatory Visit (INDEPENDENT_AMBULATORY_CARE_PROVIDER_SITE_OTHER): Payer: 59 | Admitting: *Deleted

## 2014-09-30 DIAGNOSIS — I2699 Other pulmonary embolism without acute cor pulmonale: Secondary | ICD-10-CM | POA: Diagnosis not present

## 2014-09-30 DIAGNOSIS — Z7901 Long term (current) use of anticoagulants: Secondary | ICD-10-CM

## 2014-09-30 DIAGNOSIS — Z5181 Encounter for therapeutic drug level monitoring: Secondary | ICD-10-CM

## 2014-09-30 LAB — POCT INR: INR: 1.6

## 2014-09-30 NOTE — Progress Notes (Signed)
I have reviewed and agree with the plan. 

## 2014-09-30 NOTE — Progress Notes (Signed)
Pre visit review using our clinic review tool, if applicable. No additional management support is needed unless otherwise documented below in the visit note. 

## 2014-10-14 ENCOUNTER — Ambulatory Visit (INDEPENDENT_AMBULATORY_CARE_PROVIDER_SITE_OTHER): Payer: 59 | Admitting: General Practice

## 2014-10-14 DIAGNOSIS — Z7901 Long term (current) use of anticoagulants: Secondary | ICD-10-CM

## 2014-10-14 DIAGNOSIS — I2699 Other pulmonary embolism without acute cor pulmonale: Secondary | ICD-10-CM

## 2014-10-14 DIAGNOSIS — Z5181 Encounter for therapeutic drug level monitoring: Secondary | ICD-10-CM

## 2014-10-14 LAB — POCT INR: INR: 1.9

## 2014-10-14 NOTE — Progress Notes (Signed)
I have reviewed and agree with the plan. 

## 2014-10-14 NOTE — Progress Notes (Signed)
Pre visit review using our clinic review tool, if applicable. No additional management support is needed unless otherwise documented below in the visit note. 

## 2014-10-26 ENCOUNTER — Ambulatory Visit (HOSPITAL_BASED_OUTPATIENT_CLINIC_OR_DEPARTMENT_OTHER): Payer: 59 | Admitting: Family

## 2014-10-26 ENCOUNTER — Other Ambulatory Visit: Payer: 59

## 2014-10-26 ENCOUNTER — Encounter: Payer: Self-pay | Admitting: Family

## 2014-10-26 ENCOUNTER — Ambulatory Visit (HOSPITAL_BASED_OUTPATIENT_CLINIC_OR_DEPARTMENT_OTHER)
Admission: RE | Admit: 2014-10-26 | Discharge: 2014-10-26 | Disposition: A | Discharge status: Home / Self Care | Payer: 59 | Source: Ambulatory Visit | Attending: Family | Admitting: Family

## 2014-10-26 ENCOUNTER — Other Ambulatory Visit (HOSPITAL_BASED_OUTPATIENT_CLINIC_OR_DEPARTMENT_OTHER): Payer: 59

## 2014-10-26 ENCOUNTER — Ambulatory Visit: Payer: 59 | Admitting: Hematology & Oncology

## 2014-10-26 VITALS — BP 131/86 | HR 60 | Temp 98.1°F | Resp 14 | Ht 67.0 in

## 2014-10-26 DIAGNOSIS — D696 Thrombocytopenia, unspecified: Secondary | ICD-10-CM

## 2014-10-26 DIAGNOSIS — M79661 Pain in right lower leg: Secondary | ICD-10-CM | POA: Diagnosis present

## 2014-10-26 DIAGNOSIS — Z86718 Personal history of other venous thrombosis and embolism: Secondary | ICD-10-CM

## 2014-10-26 DIAGNOSIS — I2699 Other pulmonary embolism without acute cor pulmonale: Secondary | ICD-10-CM

## 2014-10-26 DIAGNOSIS — I82491 Acute embolism and thrombosis of other specified deep vein of right lower extremity: Secondary | ICD-10-CM

## 2014-10-26 LAB — CBC WITH DIFFERENTIAL (CANCER CENTER ONLY)
BASO#: 0 10*3/uL (ref 0.0–0.2)
BASO%: 0.2 % (ref 0.0–2.0)
EOS ABS: 0.2 10*3/uL (ref 0.0–0.5)
EOS%: 5.6 % (ref 0.0–7.0)
HCT: 34.3 % — ABNORMAL LOW (ref 34.8–46.6)
HEMOGLOBIN: 11.2 g/dL — AB (ref 11.6–15.9)
LYMPH#: 1.7 10*3/uL (ref 0.9–3.3)
LYMPH%: 40 % (ref 14.0–48.0)
MCH: 28.1 pg (ref 26.0–34.0)
MCHC: 32.7 g/dL (ref 32.0–36.0)
MCV: 86 fL (ref 81–101)
MONO#: 0.3 10*3/uL (ref 0.1–0.9)
MONO%: 7.4 % (ref 0.0–13.0)
NEUT%: 46.8 % (ref 39.6–80.0)
NEUTROS ABS: 2 10*3/uL (ref 1.5–6.5)
Platelets: 178 10*3/uL (ref 145–400)
RBC: 3.99 10*6/uL (ref 3.70–5.32)
RDW: 14.6 % (ref 11.1–15.7)
WBC: 4.3 10*3/uL (ref 3.9–10.0)

## 2014-10-26 NOTE — Progress Notes (Signed)
Hematology and Oncology Follow Up Visit  Hannah Thompson [id number] Feb 16, 1970 44 y.o. 10/26/2014   Principle Diagnosis:  Acute PE DVT in right lower extremity Thrombocytopenia  Current Therapy:   Coumadin - 2 years of therapy - will complete in September 2017    Interim History:  Hannah Thompson is here today for a follow-up.  She is doing well and has not had any discomfort in her right leg. [place] today showed complete resolution of DVT.  She has had no episodes of bleeding or bruising while on Coumadin. She continue to her her PT/INR checked regularly at the coumadin clinic. She will complete 2 years of anticoagulation in September 2017.  No fever, chills, n/v, cough, rash, dizziness, blurred vision, SOB, chest pain, palpitations, abdominal pain, constipation, diarrhea, blood in urine or stool.  No numbness or tingling in her extremities. She had some swelling in the right leg that would come and go. After starting on hydrochlorothiazide this has resolved.  She is eating healthy and staying well hydrated. She is concerned with her weight gain and would like to lose a few lbs.   Medications:    Medication List       This list is accurate as of: 10/26/14  2:37 PM.  Always use your most recent med list.               acetaminophen 325 MG tablet  Commonly known as:  TYLENOL  Take 2 tablets (650 mg total) by mouth every 6 (six) hours as needed for mild pain, moderate pain or headache (or Fever >/= 101).     cetirizine 10 MG tablet  Commonly known as:  ZYRTEC  Take 1 tablet (10 mg total) by mouth daily.     cyanocobalamin 2000 MCG tablet  Take 2,000 mcg by mouth daily.     desonide 0.05 % ointment  Commonly known as:  DESOWEN  Apply 1 application topically 2 (two) times daily.     EPINEPHrine 0.3 mg/0.3 mL Devi  Commonly known as:  EPI-PEN  Inject 0.3 mg into the muscle daily as needed (Anaphylaxis).     EPINEPHrine 0.3 mg/0.3 mL Soaj injection  Commonly known as:  EPI-PEN    Inject 0.3 mLs (0.3 mg total) into the muscle once.     hydrochlorothiazide 12.5 MG capsule  Commonly known as:  MICROZIDE  Take 1 capsule (12.5 mg total) by mouth daily.     hydrochlorothiazide 12.5 MG capsule  Commonly known as:  MICROZIDE     hydrOXYzine 25 MG tablet  Commonly known as:  ATARAX/VISTARIL  Take 25 mg by mouth 3 (three) times daily as needed for anxiety or itching.     ranitidine 150 MG tablet  Commonly known as:  ZANTAC  Take 150 mg by mouth 2 (two) times daily.     traMADol 50 MG tablet  Commonly known as:  ULTRAM  Take 1 tablet (50 mg total) by mouth at bedtime as needed.     triamcinolone 55 MCG/ACT Aero nasal inhaler  Commonly known as:  NASACORT AQ  Place 2 sprays into the nose daily.     triamcinolone cream 0.1 %  Commonly known as:  KENALOG  Apply 1 application topically 2 (two) times daily.     warfarin 5 MG tablet  Commonly known as:  COUMADIN  Take as directed by anticoagulation clinic        Allergies:  Allergies  Allergen Reactions  . Oxaprozin     angioedema  .  Sulfonamide Derivatives     REACTION: hives  . Orange Juice [Orange Oil] Hives    Orange juice  . Tomato Hives    tomatoes  . Nsaids Rash    Past Medical History, Surgical history, Social history, and Family History were reviewed and updated.  Review of Systems: All other 10 point review of systems is negative.   Physical Exam:  height is 5\' 7"  (1.702 m). Her oral temperature is 98.1 F (36.7 C). Her blood pressure is 131/86 and her pulse is 60. Her respiration is 14.   Wt Readings from Last 3 Encounters:  04/28/14 275 lb 1.3 oz (124.775 kg)  02/24/14 275 lb (124.739 kg)  02/03/14 270 lb (122.471 kg)    Ocular: Sclerae unicteric, pupils equal, round and reactive to light Ear-nose-throat: Oropharynx clear, dentition fair Lymphatic: No cervical or supraclavicular adenopathy Lungs no rales or rhonchi, good excursion bilaterally Heart regular rate and rhythm, no  murmur appreciated Abd soft, nontender, positive bowel sounds MSK no focal spinal tenderness, no joint edema Neuro: non-focal, well-oriented, appropriate affect Breasts: Deferred  Lab Results  Component Value Date   WBC 4.3 10/26/2014   HGB 11.2* 10/26/2014   HCT 34.3* 10/26/2014   MCV 86 10/26/2014   PLT 178 10/26/2014   Lab Results  Component Value Date   IRON 51 03/17/2009   IRONPCTSAT 17.2* 03/17/2009   Lab Results  Component Value Date   RBC 3.99 10/26/2014   No results found for: KPAFRELGTCHN, LAMBDASER, KAPLAMBRATIO No results found for: IGGSERUM, IGA, IGMSERUM No results found for: Marda StalkerOTALPROTELP, ALBUMINELP, A1GS, A2GS, BETS, BETA2SER, GAMS, MSPIKE, SPEI   Chemistry      Component Value Date/Time   NA 144 02/24/2014 0922   NA 138 11/25/2013 0959   K 4.2 02/24/2014 0922   K 4.1 11/25/2013 0959   CL 104 02/24/2014 0922   CL 104 11/25/2013 0959   CO2 26 02/24/2014 0922   CO2 27 11/25/2013 0959   BUN 9 02/24/2014 0922   BUN 10 11/25/2013 0959   CREATININE 0.6 02/24/2014 0922   CREATININE 0.60 11/25/2013 0959      Component Value Date/Time   CALCIUM 8.8 02/24/2014 0922   CALCIUM 9.0 11/25/2013 0959   ALKPHOS 56 02/24/2014 0922   ALKPHOS 69 11/25/2013 0959   AST 21 02/24/2014 0922   AST 15 11/25/2013 0959   ALT 19 02/24/2014 0922   ALT 16 11/25/2013 0959   BILITOT 0.50 02/24/2014 0922   BILITOT 0.3 11/25/2013 0959     Impression and Plan: Hannah Thompson is a very pleasant 44 year old African-American female pulmonary embolus and a right leg thrombus diagnosed in September 2015. She has responded nicely to Coumadin. Both her PEs and right lower extremity DVT have resolved completely.  She will continue on anticoagulation for a full 2 years finishing in September 2017.  She has her PT/INR checked regularly at the Coumadin clinic.  We will plan to see her back in 4 months for labs and follow-up.  She knows to contact us with any questions or concerns. We can  certainly see her sooner if need be.   Verdie Mosher[place]IN[place], M, NP 10/24/20162:37 PM

## 2014-10-27 LAB — D-DIMER, QUANTITATIVE (NOT AT ARMC): D DIMER QUANT: 0.33 ug{FEU}/mL (ref 0.00–0.48)

## 2014-11-03 ENCOUNTER — Ambulatory Visit: Payer: 59 | Admitting: Hematology & Oncology

## 2014-11-03 ENCOUNTER — Other Ambulatory Visit: Payer: 59

## 2014-11-11 ENCOUNTER — Ambulatory Visit: Payer: 59

## 2015-01-13 ENCOUNTER — Ambulatory Visit (INDEPENDENT_AMBULATORY_CARE_PROVIDER_SITE_OTHER): Payer: BLUE CROSS/BLUE SHIELD | Admitting: General Practice

## 2015-01-13 ENCOUNTER — Encounter: Payer: Self-pay | Admitting: Family Medicine

## 2015-01-13 ENCOUNTER — Other Ambulatory Visit (INDEPENDENT_AMBULATORY_CARE_PROVIDER_SITE_OTHER): Payer: BLUE CROSS/BLUE SHIELD

## 2015-01-13 ENCOUNTER — Ambulatory Visit (INDEPENDENT_AMBULATORY_CARE_PROVIDER_SITE_OTHER): Payer: BLUE CROSS/BLUE SHIELD | Admitting: Family Medicine

## 2015-01-13 ENCOUNTER — Ambulatory Visit (INDEPENDENT_AMBULATORY_CARE_PROVIDER_SITE_OTHER)
Admission: RE | Admit: 2015-01-13 | Discharge: 2015-01-13 | Disposition: A | Discharge status: Home / Self Care | Payer: BLUE CROSS/BLUE SHIELD | Source: Ambulatory Visit | Attending: Family Medicine | Admitting: Family Medicine

## 2015-01-13 VITALS — BP 132/80 | HR 68 | Wt 279.0 lb

## 2015-01-13 DIAGNOSIS — Z7901 Long term (current) use of anticoagulants: Secondary | ICD-10-CM | POA: Diagnosis not present

## 2015-01-13 DIAGNOSIS — S83242A Other tear of medial meniscus, current injury, left knee, initial encounter: Secondary | ICD-10-CM | POA: Diagnosis not present

## 2015-01-13 DIAGNOSIS — M25562 Pain in left knee: Secondary | ICD-10-CM

## 2015-01-13 DIAGNOSIS — S83249A Other tear of medial meniscus, current injury, unspecified knee, initial encounter: Secondary | ICD-10-CM | POA: Insufficient documentation

## 2015-01-13 DIAGNOSIS — Z5181 Encounter for therapeutic drug level monitoring: Secondary | ICD-10-CM

## 2015-01-13 DIAGNOSIS — M25462 Effusion, left knee: Secondary | ICD-10-CM | POA: Diagnosis not present

## 2015-01-13 LAB — POCT INR: INR: 1.1

## 2015-01-13 MED ORDER — TRAMADOL HCL 50 MG PO TABS: 50.0000 mg | ORAL_TABLET | Freq: Every evening | ORAL | Status: DC | PRN

## 2015-01-13 NOTE — Assessment & Plan Note (Signed)
Patient had aspiration today. 20 mL removed. Patient did have near complete resolution of pain. Still had positive McMurray's. We discussed that x-rays are needed for further evaluation. These were ordered today. Work with Event organiserathletic trainer. Given a brace. Given tramadol prescription. We discussed icing regimen. Discussed the possibility of formal physical therapy which patient will avoid at this moment. Patient will come back in 3 weeks. If continuing have pain we'll consider formal physical therapy versus any internal derangement we may need to consider advanced imaging.

## 2015-01-13 NOTE — Progress Notes (Addendum)
Pre visit review using our clinic review tool, if applicable. No additional management support is needed unless otherwise documented below in the visit note. Patient has a history of non-compliance.  INR is extremely low today, 1.1.  Patient states that she has not been taking medication as prescribed and says that she will do better.  RN stressed the importance of taking medication every day with monthly checks to avoid unwanted side effects.  Patient verbalized understanding and thanked me for my concern.

## 2015-01-13 NOTE — Patient Instructions (Addendum)
Good to see you Ice 20 minutes 2 times daily. Usually after activity and before bed. Try Vitamin D 2000 IU daily Ibuprofen if you tolerate it for next 2-3 days Tramadol for break through pain Xray downstairs Exercises 3 times a week.  Brace with activity  See me again in 3-4 weeks to make sure you are improving.

## 2015-01-13 NOTE — Progress Notes (Signed)
[facility] [address] [place], [place] [address] Phone: [phone] Subjective:     CC:  Left Knee pain  [id number] Hannah Thompson is a 45 y.o. female coming in with complaint of knee pain. Patient states she fell approximately 1 month ago when she was working. Does not rib or exactly how she fell. Had swelling of the knee approximately one day afterwards. Since then when she is standing she has increasing discomfort. Seems to be more on the anterior medial aspect of the knee. Denies any giving out on her but sometimes feels it is unstable. No radiation down the leg. No numbness. Rates the severity of pain though is 8 out of 10 when she does some twisting motions. Has to sit occasionally at work which she never had to do previously. No nighttime awakening. Has not responded well to over-the-counter medications but has tried only ibuprofen. Patient is on a blood thinner and does have an allergy to anti-inflammatories.  Past Medical History  Diagnosis Date  . Headache(784.0) 06/21/2009    recurrent  . VITAMIN B12 DEFICIENCY 03/18/2009  . ASTHMA 03/17/2009  . ALLERGIC RHINITIS 03/17/2009  . ECZEMA 03/17/2009  . DISC DISEASE, LUMBAR 03/17/2009    mild facet arthropathy by MRI 2005  . HYPERLIPIDEMIA 03/17/2009  . ANEMIA-NOS 03/17/2009  . KELOID 06/21/2009   Past Surgical History  Procedure Laterality Date  . Keloid excision  mid 1990's    s/p keloid removal-laser    Social History  Substance Use Topics  . Smoking status: Former Smoker -- 0.25 packs/day for 4 years    Types: Cigarettes    Start date: 03/17/2004    Quit date: 04/17/2008  . Smokeless tobacco: Never Used     Comment: quit 5 years ago  . Alcohol Use: No     Comment: socially   Allergies  Allergen Reactions  . Oxaprozin     angioedema  . Sulfonamide Derivatives     REACTION: hives  . Orange Juice [Orange Oil] Hives    Orange juice  . Tomato Hives    tomatoes  . Nsaids Rash    Family History  Problem Relation Age of Onset  . Cancer Father     Prostate Cancer  . Hypertension Father   . Allergies Sister   . Cancer Paternal Aunt     Ovarian Cancer  . Cancer Other     Grandparent-Lung Cancer  . Diabetes Other     Grandparent  . Heart disease Other     Grandparent  . Alcohol abuse Other     Several on both sides of family-3 uncles  . Hypertension Mother   . Diabetes Mother   . Deep vein thrombosis Sister      Past medical history, social, surgical and family history all reviewed in electronic medical record.   Review of Systems: No headache, visual changes, nausea, vomiting, diarrhea, constipation, dizziness, abdominal pain, skin rash, fevers, chills, night sweats, weight loss, swollen lymph nodes, body aches, joint swelling, muscle aches, chest pain, shortness of breath, mood changes.   Objective Blood pressure 132/80, pulse 68, weight 279 lb (126.554 kg), last menstrual period 12/23/2014, SpO2 99 %.  General: No apparent distress alert and oriented x3 mood and affect normal, dressed appropriately. Obese, Tearful HEENT: Pupils equal, extraocular movements intact  Respiratory: Patient's speak in full sentences and does not appear short of breath  Cardiovascular: No lower extremity edema, non tender, no erythema  Skin: Warm dry intact  with no signs of infection or rash on extremities or on axial skeleton.  Abdomen: Soft nontender  Neuro: Cranial nerves II through XII are intact, neurovascularly intact in all extremities with 2+ DTRs and 2+ pulses.  Lymph: No lymphadenopathy of posterior or anterior cervical chain or axillae bilaterally.  Gait normal with good balance and coordination.  MSK:  Non tender with full range of motion and good stability and symmetric strength and tone of shoulders, elbows, wrist,  and ankles bilaterally.  Knee: left  Effusion noted Mild diffuse tenderness Range of motion was last 5 of flexion Ligaments with solid  consistent endpoints including ACL, PCL, LCL, MCL. Positive Mcmurray's, Apley's, and Thessalonian tests. Mild painful patellar compression. Patellar glide without crepitus. Patellar and quadriceps tendons unremarkable. Hamstring and quadriceps strength is normal.  Contralateral knee unremarkable  MSK US performed of: Left This study was ordered, performed, and interpreted by Terrilee FilesZach Smith D.O.  Knee: All structures visualized. Patient does have a knee effusion with mild synovitis Patient does have a acute anterior medial meniscal tear that seems to be an acute on chronic. Hypoechoic changes noted. No significant displacement. Patellar Tendon unremarkable on long and transverse views without effusion. No abnormality of prepatellar bursa. LCL and MCL unremarkable on long and transverse views. No abnormality of origin of medial or lateral head of the gastrocnemius.  IMPRESSION:  Acute on chronic meniscal injury with the effusion   Procedure: Real-time Ultrasound Guided Injection of left knee Device: GE Logiq E  Ultrasound guided injection is preferred based studies that show increased duration, increased effect, greater accuracy, decreased procedural pain, increased response rate, and decreased cost with ultrasound guided versus blind injection.  Verbal informed consent obtained.  Time-out conducted.  Noted no overlying erythema, induration, or other signs of local infection.  Skin prepped in a sterile fashion.  Local anesthesia: Topical Ethyl chloride.  With sterile technique and under real time ultrasound guidance: With a 22-gauge 2 inch needle patient was injected with 4 cc of 0.5% Marcaine and aspirated 25 mL of strawlike fluid then injected 1 cc of Kenalog 40 mg/dL. This was from a superior lateral approach.  Completed without difficulty  Pain immediately resolved suggesting accurate placement of the medication.  Advised to call if fevers/chills, erythema, induration, drainage, or  persistent bleeding.  Images permanently stored and available for review in the ultrasound unit.  Impression: Technically successful ultrasound guided injection.  Procedure note 97110; 15 minutes spent for Therapeutic exercises as stated in above notes.  This included exercises focusing on stretching, strengthening, with significant focus on eccentric aspects.  Flexion and section exercises given focusing on vastus medialis oblique strengthening as well as hip abductor strengthening and hamstring stretching Proper technique shown and discussed handout in great detail with ATC.  All questions were discussed and answered.     Impression and Recommendations:     This case required medical decision making of moderate complexity.

## 2015-01-13 NOTE — Progress Notes (Signed)
Pre visit review using our clinic review tool, if applicable. No additional management support is needed unless otherwise documented below in the visit note. 

## 2015-01-13 NOTE — Progress Notes (Signed)
I have reviewed and agree with the plan. 

## 2015-01-18 NOTE — Progress Notes (Signed)
I have reviewed and agree with the plan. 

## 2015-01-20 ENCOUNTER — Telehealth: Payer: Self-pay | Admitting: General Practice

## 2015-01-20 ENCOUNTER — Ambulatory Visit: Payer: BLUE CROSS/BLUE SHIELD

## 2015-01-20 NOTE — Telephone Encounter (Signed)
Patient rescheduled appt today. States that she got new insurance and was not able to get her medication in time. She wanted me to make you aware iof this .

## 2015-01-27 ENCOUNTER — Ambulatory Visit (INDEPENDENT_AMBULATORY_CARE_PROVIDER_SITE_OTHER): Payer: BLUE CROSS/BLUE SHIELD | Admitting: General Practice

## 2015-01-27 DIAGNOSIS — Z5181 Encounter for therapeutic drug level monitoring: Secondary | ICD-10-CM | POA: Diagnosis not present

## 2015-01-27 DIAGNOSIS — I2699 Other pulmonary embolism without acute cor pulmonale: Secondary | ICD-10-CM

## 2015-01-27 DIAGNOSIS — Z7901 Long term (current) use of anticoagulants: Secondary | ICD-10-CM | POA: Diagnosis not present

## 2015-01-27 LAB — POCT INR: INR: 1.6

## 2015-01-27 NOTE — Progress Notes (Signed)
Pre visit review using our clinic review tool, if applicable. No additional management support is needed unless otherwise documented below in the visit note. INR is low today.  Patient ran out of medication and had to wait until new insurance policy took effect.  Encouraged patient to have medication refilled before she runs out.  Reiterated that a low INR can lead to unwanted side effects such as clots.  Boosted patient for 2 days and asked patient to resume current dosage.  Will re-check in 3 to 4 weeks.  Patient verbalized understanding and thanked me for my concern.

## 2015-02-04 ENCOUNTER — Encounter: Payer: Self-pay | Admitting: Family Medicine

## 2015-02-04 ENCOUNTER — Ambulatory Visit (INDEPENDENT_AMBULATORY_CARE_PROVIDER_SITE_OTHER): Payer: BLUE CROSS/BLUE SHIELD | Admitting: Family Medicine

## 2015-02-04 VITALS — BP 106/78 | HR 66 | Ht 67.0 in | Wt 273.0 lb

## 2015-02-04 DIAGNOSIS — S83242D Other tear of medial meniscus, current injury, left knee, subsequent encounter: Secondary | ICD-10-CM

## 2015-02-04 NOTE — Assessment & Plan Note (Signed)
Patient is doing significant better at this time. We discussed icing regimen, home exercises, which activities to do an which was to potentially avoid. Patient is going to do very well and will follow-up with me again in 6 weeks to make sure she is completely pain-free at that time. We'll call if any worsening symptoms soon.

## 2015-02-04 NOTE — Progress Notes (Signed)
[name] [address] [place], [place] [address] Phone: [phone] Subjective:     CC:  Left Knee pain  [id number] Hannah Thompson is a 45 y.o. female coming in with complaint of knee pain. Patient was found to have an acute on chronic tear of the medial meniscus. Patient did have a knee effusion. States that after the injection she is feeling 90% better. Has been doing home exercises, been wearing the brace regularly. States that it does not affect any of her daily activities. Denies any new symptoms.  Past Medical History  Diagnosis Date  . Headache(784.0) 06/21/2009    recurrent  . VITAMIN B12 DEFICIENCY 03/18/2009  . ASTHMA 03/17/2009  . ALLERGIC RHINITIS 03/17/2009  . ECZEMA 03/17/2009  . DISC DISEASE, LUMBAR 03/17/2009    mild facet arthropathy by MRI 2005  . HYPERLIPIDEMIA 03/17/2009  . ANEMIA-NOS 03/17/2009  . KELOID 06/21/2009   Past Surgical History  Procedure Laterality Date  . Keloid excision  mid 1990's    s/p keloid removal-laser    Social History  Substance Use Topics  . Smoking status: Former Smoker -- 0.25 packs/day for 4 years    Types: Cigarettes    Start date: 03/17/2004    Quit date: 04/17/2008  . Smokeless tobacco: Never Used     Comment: quit 5 years ago  . Alcohol Use: No     Comment: socially   Allergies  Allergen Reactions  . Oxaprozin     angioedema  . Sulfonamide Derivatives     REACTION: hives  . Orange Juice [Orange Oil] Hives    Orange juice  . Tomato Hives    tomatoes  . Nsaids Rash   Family History  Problem Relation Age of Onset  . Cancer Father     Prostate Cancer  . Hypertension Father   . Allergies Sister   . Cancer Paternal Aunt     Ovarian Cancer  . Cancer Other     Grandparent-Lung Cancer  . Diabetes Other     Grandparent  . Heart disease Other     Grandparent  . Alcohol abuse Other     Several on both sides of family-3 uncles  . Hypertension Mother   . Diabetes Mother   . Deep  vein thrombosis Sister      Past medical history, social, surgical and family history all reviewed in electronic medical record.   Review of Systems: No headache, visual changes, nausea, vomiting, diarrhea, constipation, dizziness, abdominal pain, skin rash, fevers, chills, night sweats, weight loss, swollen lymph nodes, body aches, joint swelling, muscle aches, chest pain, shortness of breath, mood changes.   Objective Blood pressure 106/78, pulse 66, height  (1.702 m), weight 273 lb (123.832 kg), last menstrual period 12/23/2014, SpO2 98 %.  General: No apparent distress alert and oriented x3 mood and affect normal, dressed appropriately. Obese, Tearful HEENT: Pupils equal, extraocular movements intact  Respiratory: Patient's speak in full sentences and does not appear short of breath  Cardiovascular: No lower extremity edema, non tender, no erythema  Skin: Warm dry intact with no signs of infection or rash on extremities or on axial skeleton.  Abdomen: Soft nontender  Neuro: Cranial nerves II through XII are intact, neurovascularly intact in all extremities with 2+ DTRs and 2+ pulses.  Lymph: No lymphadenopathy of posterior or anterior cervical chain or axillae bilaterally.  Gait normal with good balance and coordination.  MSK:  Non tender with full range of motion  and good stability and symmetric strength and tone of shoulders, elbows, wrist,  and ankles bilaterally.  Knee: left  No effusion noted Mild tenderness to palpation over the medial joint line Range of motion was last 5 of flexion Ligaments with solid consistent endpoints including ACL, PCL, LCL, MCL. Positive Mcmurray's, Apley's, and Thessalonian tests still present. Mild painful patellar compression. Patellar glide without crepitus. Patellar and quadriceps tendons unremarkable. Hamstring and quadriceps strength is normal.  Contralateral knee unremarkable      Impression and Recommendations:     This case  required medical decision making of moderate complexity.

## 2015-02-04 NOTE — Patient Instructions (Signed)
Good to see you  Ice is your friend Wear brace when you need it.  Avoid twisting motions for next 3 weeks.  Otherwise see me in 6 weeks if not perfect You are doing great!!!!

## 2015-02-04 NOTE — Progress Notes (Signed)
I have reviewed and agree with the plan. 

## 2015-02-04 NOTE — Progress Notes (Signed)
Pre visit review using our clinic review tool, if applicable. No additional management support is needed unless otherwise documented below in the visit note. 

## 2015-02-23 ENCOUNTER — Ambulatory Visit (HOSPITAL_BASED_OUTPATIENT_CLINIC_OR_DEPARTMENT_OTHER): Payer: BLUE CROSS/BLUE SHIELD | Admitting: Hematology & Oncology

## 2015-02-23 ENCOUNTER — Encounter: Payer: Self-pay | Admitting: Hematology & Oncology

## 2015-02-23 ENCOUNTER — Other Ambulatory Visit (HOSPITAL_BASED_OUTPATIENT_CLINIC_OR_DEPARTMENT_OTHER): Payer: BLUE CROSS/BLUE SHIELD

## 2015-02-23 VITALS — BP 112/65 | HR 60 | Temp 97.7°F | Resp 16 | Ht 67.0 in | Wt 280.0 lb

## 2015-02-23 DIAGNOSIS — I2692 Saddle embolus of pulmonary artery without acute cor pulmonale: Secondary | ICD-10-CM

## 2015-02-23 DIAGNOSIS — D72819 Decreased white blood cell count, unspecified: Secondary | ICD-10-CM | POA: Diagnosis not present

## 2015-02-23 DIAGNOSIS — I82401 Acute embolism and thrombosis of unspecified deep veins of right lower extremity: Secondary | ICD-10-CM

## 2015-02-23 DIAGNOSIS — I2699 Other pulmonary embolism without acute cor pulmonale: Secondary | ICD-10-CM

## 2015-02-23 DIAGNOSIS — D696 Thrombocytopenia, unspecified: Secondary | ICD-10-CM

## 2015-02-23 DIAGNOSIS — I82491 Acute embolism and thrombosis of other specified deep vein of right lower extremity: Secondary | ICD-10-CM

## 2015-02-23 LAB — CBC WITH DIFFERENTIAL (CANCER CENTER ONLY)
BASO#: 0 10*3/uL (ref 0.0–0.2)
BASO%: 0.3 % (ref 0.0–2.0)
EOS%: 6.3 % (ref 0.0–7.0)
Eosinophils Absolute: 0.2 10*3/uL (ref 0.0–0.5)
HEMATOCRIT: 34.5 % — AB (ref 34.8–46.6)
HGB: 11.3 g/dL — ABNORMAL LOW (ref 11.6–15.9)
LYMPH#: 1.4 10*3/uL (ref 0.9–3.3)
LYMPH%: 44.4 % (ref 14.0–48.0)
MCH: 27.2 pg (ref 26.0–34.0)
MCHC: 32.8 g/dL (ref 32.0–36.0)
MCV: 83 fL (ref 81–101)
MONO#: 0.4 10*3/uL (ref 0.1–0.9)
MONO%: 11.8 % (ref 0.0–13.0)
NEUT%: 37.2 % — AB (ref 39.6–80.0)
NEUTROS ABS: 1.1 10*3/uL — AB (ref 1.5–6.5)
PLATELETS: 163 10*3/uL (ref 145–400)
RBC: 4.15 10*6/uL (ref 3.70–5.32)
RDW: 14.8 % (ref 11.1–15.7)
WBC: 3 10*3/uL — ABNORMAL LOW (ref 3.9–10.0)

## 2015-02-23 NOTE — Progress Notes (Signed)
[facility]  Telephone:(336) [phone] Fax:(336) [phone]  ID: [name] OB: 10-16-1970 MR#: [id number] ZHY#:[id number] Patient Care Team: Corwin Levins, MD as PCP - General  DIAGNOSIS: Acute PE DVT in right lower extremity Thrombocytopenia - transient Leukopenia - EAL  INTERVAL HISTORY: Ms. Sakuma is here today for a follow-up. She is doing pretty well. She is having some problems with her left knee. She has knee brace on. She does see a orthopedist for this.  She's doing well with the Coumadin. She has her Coumadin level checked tomorrow.  She's had no problems with nausea or vomiting. She's had no problems with fever. She's had no bleeding.  She's had no leg pain. She's had no leg swelling.  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   Overall, she has a good performance status. Performance status is ECOG 0.    CURRENT TREATMENT: Coumadin - 2 years of therapy. To be completed in September 2017  REVIEW OF SYSTEMS: All other 10 point review of systems is negative.   PAST MEDICAL HISTORY: Past Medical History  Diagnosis Date  . Headache(784.0) 06/21/2009    recurrent  . VITAMIN B12 DEFICIENCY 03/18/2009  . ASTHMA 03/17/2009  . ALLERGIC RHINITIS 03/17/2009  . ECZEMA 03/17/2009  . DISC DISEASE, LUMBAR 03/17/2009    mild facet arthropathy by MRI 2005  . HYPERLIPIDEMIA 03/17/2009  . ANEMIA-NOS 03/17/2009  . KELOID 06/21/2009    PAST SURGICAL HISTORY: Past Surgical History  Procedure Laterality Date  . Keloid excision  mid 1990's    s/p keloid removal-laser     FAMILY HISTORY Family History  Problem Relation Age of Onset  . Cancer Father     Prostate Cancer  . Hypertension Father   . Allergies Sister   . Cancer Paternal Aunt     Ovarian Cancer  . Cancer Other     Grandparent-Lung Cancer  . Diabetes Other     Grandparent  . Heart disease Other     Grandparent  . Alcohol abuse Other     Several on both sides of family-3 uncles  .  Hypertension Mother   . Diabetes Mother   . Deep vein thrombosis Sister     GYNECOLOGIC HISTORY:  No LMP recorded.   SOCIAL HISTORY:  Social History   Social History Narrative   ADVANCED DIRECTIVES: <no information>  HEALTH MAINTENANCE: Social History  Substance Use Topics  . Smoking status: Former Smoker -- 0.25 packs/day for 4 years    Types: Cigarettes    Start date: 03/17/2004    Quit date: 04/17/2008  . Smokeless tobacco: Never Used     Comment: quit 5 years ago  . Alcohol Use: No     Comment: socially   Colonoscopy: PAP: Bone density: Lipid panel:  Allergies  Allergen Reactions  . Oxaprozin     angioedema  . Sulfonamide Derivatives     REACTION: hives  . Orange Juice [Orange Oil] Hives    Orange juice  . Tomato Hives    tomatoes  . Nsaids Rash    Current Outpatient Prescriptions  Medication Sig Dispense Refill  . acetaminophen (TYLENOL) 325 MG tablet Take 2 tablets (650 mg total) by mouth every 6 (six) hours as needed for mild pain, moderate pain or headache (or Fever >/= 101).    . cetirizine (ZYRTEC) 10 MG tablet Take 1 tablet (10 mg total) by mouth daily. 30 tablet 11  . cyanocobalamin 2000 MCG tablet Take 2,000 mcg by  mouth daily.    Marland Kitchen desonide (DESOWEN) 0.05 % ointment Apply 1 application topically 2 (two) times daily.     Marland Kitchen EPINEPHrine (EPI-PEN) 0.3 mg/0.3 mL DEVI Inject 0.3 mg into the muscle daily as needed (Anaphylaxis).    Marland Kitchen EPINEPHrine 0.3 mg/0.3 mL IJ SOAJ injection Inject 0.3 mLs (0.3 mg total) into the muscle once. 1 Device 1  . hydrochlorothiazide (MICROZIDE) 12.5 MG capsule Take 1 capsule (12.5 mg total) by mouth daily. 30 capsule 11  . hydrochlorothiazide (MICROZIDE) 12.5 MG capsule     . hydrOXYzine (ATARAX/VISTARIL) 25 MG tablet Take 25 mg by mouth 3 (three) times daily as needed for anxiety or itching.    . ranitidine (ZANTAC) 150 MG tablet Take 150 mg by mouth 2 (two) times daily.    . traMADol (ULTRAM) 50 MG tablet Take 1 tablet (50  mg total) by mouth at bedtime as needed. 30 tablet 0  . triamcinolone (NASACORT AQ) 55 MCG/ACT AERO nasal inhaler Place 2 sprays into the nose daily. 1 Inhaler 12  . triamcinolone cream (KENALOG) 0.1 % Apply 1 application topically 2 (two) times daily.     Marland Kitchen warfarin (COUMADIN) 5 MG tablet Take as directed by anticoagulation clinic 105 tablet 0   No current facility-administered medications for this visit.    OBJECTIVE: Filed Vitals:   02/23/15 0956  BP: 112/65  Pulse: 60  Temp: 97.7 F (36.5 C)  Resp: 16   Body mass index is 43.84 kg/(m^2).   Obese African American female in no obvious distress. Head and neck exam shows no ocular or oral lesions. There are no palpable cervical or supraclavicular lymph nodes. Lungs are clear. Cardiac exam regular rate and rhythm with no murmurs, rubs or bruits. Abdomen is soft. She is obese patients good bowel sounds. There is no fluid wave. There is no palpable liver or spleen tip. Back exam shows no tenderness over the spine, ribs or hips. Extremities shows no clubbing, cyanosis or edema. No venous cord is noted in her legs. She is a negative Homans sign in her legs bilaterally. She has a knee brace on her left knee. Skin exam shows no rashes, ecchymoses or petechia. Neurological exam shows no focal neurological deficits.  LAB RESULTS: CMP     Component Value Date/Time   NA 144 02/24/2014 0922   NA 138 11/25/2013 0959   K 4.2 02/24/2014 0922   K 4.1 11/25/2013 0959   CL 104 02/24/2014 0922   CL 104 11/25/2013 0959   CO2 26 02/24/2014 0922   CO2 27 11/25/2013 0959   GLUCOSE 98 02/24/2014 0922   GLUCOSE 62* 11/25/2013 0959   BUN 9 02/24/2014 0922   BUN 10 11/25/2013 0959   CREATININE 0.6 02/24/2014 0922   CREATININE 0.60 11/25/2013 0959   CALCIUM 8.8 02/24/2014 0922   CALCIUM 9.0 11/25/2013 0959   PROT 7.1 02/24/2014 0922   PROT 7.0 11/25/2013 0959   ALBUMIN 3.3 02/24/2014 0922   ALBUMIN 4.0 11/25/2013 0959   AST 21 02/24/2014 0922   AST  15 11/25/2013 0959   ALT 19 02/24/2014 0922   ALT 16 11/25/2013 0959   ALKPHOS 56 02/24/2014 0922   ALKPHOS 69 11/25/2013 0959   BILITOT 0.50 02/24/2014 0922   BILITOT 0.3 11/25/2013 0959   GFRNONAA >90 09/14/2013 1904   GFRAA >90 09/14/2013 1904   I No results found for: SPEP Lab Results  Component Value Date   WBC 3.0* 02/23/2015   NEUTROABS 1.1* 02/23/2015   HGB  11.3* 02/23/2015   HCT 34.5* 02/23/2015   MCV 83 02/23/2015   PLT 163 02/23/2015   No results found for: LABCA2 No components found for: [id number] No results for input(s): INR in the last 168 hours.  STUDIES: None  ASSESSMENT/PLAN: Ms. Kivett is a very pleasant 45 year old African-American female. She has a normal hypercoagulable panel. However, there is a strong family history of thromboembolic disease.  We did her CT angiogram in December 2015. This did not show any residual pulmonary emboli.  She presented with a pulmonary embolus and a right leg thrombus back in September 2015.  I believe that she will need 2 years of anticoagulation. This will finish up in September 2017.  I will plan to get her back in August. At that time, we'll then consider making a change over to low-dose aspirin. I think a dose of 162 milligrams a day of aspirin would be fine for her.  She does have the leukopenia. 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  I spent about 30 minutes with her today. Is moist a lot of fun talk with her.      Josph Macho, MD 02/23/2015 10:42 AM

## 2015-02-24 ENCOUNTER — Ambulatory Visit: Payer: BLUE CROSS/BLUE SHIELD

## 2015-02-24 LAB — D-DIMER, QUANTITATIVE (NOT AT ARMC): D-DIMER: 2.7 mg{FEU}/L — AB (ref 0.00–0.49)

## 2015-02-26 ENCOUNTER — Encounter: Payer: Self-pay | Admitting: Internal Medicine

## 2015-03-01 ENCOUNTER — Ambulatory Visit: Payer: 59 | Admitting: Family

## 2015-03-01 ENCOUNTER — Other Ambulatory Visit: Payer: 59

## 2015-03-01 DIAGNOSIS — T148XXA Other injury of unspecified body region, initial encounter: Secondary | ICD-10-CM | POA: Insufficient documentation

## 2015-03-01 DIAGNOSIS — L819 Disorder of pigmentation, unspecified: Secondary | ICD-10-CM | POA: Insufficient documentation

## 2015-03-05 ENCOUNTER — Ambulatory Visit (INDEPENDENT_AMBULATORY_CARE_PROVIDER_SITE_OTHER): Payer: BLUE CROSS/BLUE SHIELD | Admitting: General Practice

## 2015-03-05 DIAGNOSIS — Z7901 Long term (current) use of anticoagulants: Secondary | ICD-10-CM

## 2015-03-05 DIAGNOSIS — Z5181 Encounter for therapeutic drug level monitoring: Secondary | ICD-10-CM

## 2015-03-05 LAB — POCT INR: INR: 1.2

## 2015-03-05 NOTE — Progress Notes (Signed)
I have reviewed and agree with the plan. 

## 2015-03-05 NOTE — Progress Notes (Signed)
Pre visit review using our clinic review tool, if applicable. No additional management support is needed unless otherwise documented below in the visit note. INR is low today.  Patient misplaced medication.  Encouraged patient to purchase and use a pill box to help manage medications.  Reiterated the risks of a low INR.  Pt verbalized understanding.

## 2015-03-12 ENCOUNTER — Ambulatory Visit (INDEPENDENT_AMBULATORY_CARE_PROVIDER_SITE_OTHER): Payer: BLUE CROSS/BLUE SHIELD | Admitting: General Practice

## 2015-03-12 DIAGNOSIS — Z7901 Long term (current) use of anticoagulants: Secondary | ICD-10-CM | POA: Diagnosis not present

## 2015-03-12 DIAGNOSIS — Z5181 Encounter for therapeutic drug level monitoring: Secondary | ICD-10-CM

## 2015-03-12 LAB — POCT INR: INR: 2

## 2015-03-12 NOTE — Progress Notes (Signed)
I have reviewed and agree with the plan. 

## 2015-03-12 NOTE — Progress Notes (Signed)
Pre visit review using our clinic review tool, if applicable. No additional management support is needed unless otherwise documented below in the visit note. 

## 2015-03-16 ENCOUNTER — Ambulatory Visit (INDEPENDENT_AMBULATORY_CARE_PROVIDER_SITE_OTHER): Payer: BLUE CROSS/BLUE SHIELD | Admitting: Family Medicine

## 2015-03-16 ENCOUNTER — Encounter: Payer: Self-pay | Admitting: Family Medicine

## 2015-03-16 VITALS — BP 124/82 | HR 63 | Ht 67.0 in | Wt 280.0 lb

## 2015-03-16 DIAGNOSIS — S83242D Other tear of medial meniscus, current injury, left knee, subsequent encounter: Secondary | ICD-10-CM

## 2015-03-16 NOTE — Assessment & Plan Note (Signed)
Patient is doing very well 2 months after the injection. I believe that she should likely be pain free in 3 months. We discussed continuing the home exercises. Patient will do bracing and start increasing her activity. If any worsening symptoms she'll come back otherwise follow-up will more time in 6 weeks. At that time if worsening pain we can repeat injection as well.

## 2015-03-16 NOTE — Progress Notes (Signed)
Pre visit review using our clinic review tool, if applicable. No additional management support is needed unless otherwise documented below in the visit note. 

## 2015-03-16 NOTE — Patient Instructions (Signed)
Good to see you  Ice is your friend still  Start walking in brace for next 2 weeks then decrease wear.  Ice right after walking.  See me again in 6 weeks if not perfect

## 2015-03-16 NOTE — Progress Notes (Signed)
Hannah Thompson, Hannah Thompson Phone: [phone] Subjective:     CC:  Left Knee pain  [id number] Hannah Thompson is a 45 y.o. female coming in with complaint of knee pain. Patient was found to have an acute on chronic tear of the medial meniscus. Patient greater than 2 months ago was given injection. Had been making significant improvement. States that she is 95% better. Nothing that is stopping her from activities. Some mild discomfort from time to time but no locking. Overall fairly happy with results. Has not had any more swelling.  Past Medical History  Diagnosis Date  . Headache(784.0) 06/21/2009    recurrent  . VITAMIN B12 DEFICIENCY 03/18/2009  . ASTHMA 03/17/2009  . ALLERGIC RHINITIS 03/17/2009  . ECZEMA 03/17/2009  . DISC DISEASE, LUMBAR 03/17/2009    mild facet arthropathy by MRI 2005  . HYPERLIPIDEMIA 03/17/2009  . ANEMIA-NOS 03/17/2009  . KELOID 06/21/2009   Past Surgical History  Procedure Laterality Date  . Keloid excision  mid 1990's    s/p keloid removal-laser    Social History  Substance Use Topics  . Smoking status: Former Smoker -- 0.25 packs/day for 4 years    Types: Cigarettes    Start date: 03/17/2004    Quit date: 04/17/2008  . Smokeless tobacco: Never Used     Comment: quit 5 years ago  . Alcohol Use: No     Comment: socially   Allergies  Allergen Reactions  . Oxaprozin     angioedema  . Sulfonamide Derivatives     REACTION: hives  . Orange Juice [Orange Oil] Hives    Orange juice  . Tomato Hives    tomatoes  . Nsaids Rash   Family History  Problem Relation Age of Onset  . Cancer Father     Prostate Cancer  . Hypertension Father   . Allergies Sister   . Cancer Paternal Aunt     Ovarian Cancer  . Cancer Other     Grandparent-Lung Cancer  . Diabetes Other     Grandparent  . Heart disease Other     Grandparent  . Alcohol abuse Other     Several on both sides of family-3 uncles  .  Hypertension Mother   . Diabetes Mother   . Deep vein thrombosis Sister      Past medical history, social, surgical and family history all reviewed in electronic medical record.   Review of Systems: No headache, visual changes, nausea, vomiting, diarrhea, constipation, dizziness, abdominal pain, skin rash, fevers, chills, night sweats, weight loss, swollen lymph nodes, body aches, joint swelling, muscle aches, chest pain, shortness of breath, mood changes.   Objective Blood pressure 124/82, pulse 63, height  (1.702 m), weight 280 lb (127.007 kg), SpO2 98 %.  General: No apparent distress alert and oriented x3 mood and affect normal, dressed appropriately. Obese, Tearful HEENT: Pupils equal, extraocular movements intact  Respiratory: Patient's speak in full sentences and does not appear short of breath  Cardiovascular: No lower extremity edema, non tender, no erythema  Skin: Warm dry intact with no signs of infection or rash on extremities or on axial skeleton.  Abdomen: Soft nontender  Neuro: Cranial nerves II through XII are intact, neurovascularly intact in all extremities with 2+ DTRs and 2+ pulses.  Lymph: No lymphadenopathy of posterior or anterior cervical chain or axillae bilaterally.  Gait normal with good balance and coordination.  MSK:  Non tender with full  range of motion and good stability and symmetric strength and tone of shoulders, elbows, wrist,  and ankles bilaterally.  Knee: left  No effusion noted Mild tenderness to palpation over the medial joint line Range of motion was last 5 of flexion Ligaments with solid consistent endpoints including ACL, PCL, LCL, MCL. Negative Thessaly sign which is an improvement. Mild painful patellar compression. Patellar glide without crepitus. Patellar and quadriceps tendons unremarkable. Hamstring and quadriceps strength is normal.  Contralateral knee unremarkable      Impression and Recommendations:     This case  required medical decision making of moderate complexity.

## 2015-03-18 ENCOUNTER — Ambulatory Visit: Payer: BLUE CROSS/BLUE SHIELD | Admitting: Family Medicine

## 2015-04-09 ENCOUNTER — Ambulatory Visit (INDEPENDENT_AMBULATORY_CARE_PROVIDER_SITE_OTHER): Payer: BLUE CROSS/BLUE SHIELD | Admitting: General Practice

## 2015-04-09 DIAGNOSIS — Z7901 Long term (current) use of anticoagulants: Secondary | ICD-10-CM | POA: Diagnosis not present

## 2015-04-09 DIAGNOSIS — Z5181 Encounter for therapeutic drug level monitoring: Secondary | ICD-10-CM | POA: Diagnosis not present

## 2015-04-09 LAB — POCT INR: INR: 2.2

## 2015-04-09 NOTE — Progress Notes (Signed)
Pre visit review using our clinic review tool, if applicable. No additional management support is needed unless otherwise documented below in the visit note. 

## 2015-04-09 NOTE — Progress Notes (Signed)
I have reviewed and agree with the plan. 

## 2015-04-27 ENCOUNTER — Encounter: Payer: Self-pay | Admitting: Internal Medicine

## 2015-04-27 ENCOUNTER — Ambulatory Visit (INDEPENDENT_AMBULATORY_CARE_PROVIDER_SITE_OTHER): Payer: BLUE CROSS/BLUE SHIELD | Admitting: Internal Medicine

## 2015-04-27 ENCOUNTER — Ambulatory Visit (INDEPENDENT_AMBULATORY_CARE_PROVIDER_SITE_OTHER): Payer: BLUE CROSS/BLUE SHIELD | Admitting: Family Medicine

## 2015-04-27 ENCOUNTER — Encounter: Payer: Self-pay | Admitting: Family Medicine

## 2015-04-27 VITALS — BP 124/72 | HR 83 | Temp 97.9°F | Resp 20 | Wt 265.0 lb

## 2015-04-27 VITALS — BP 122/80 | HR 68 | Wt 265.0 lb

## 2015-04-27 DIAGNOSIS — R51 Headache: Secondary | ICD-10-CM | POA: Diagnosis not present

## 2015-04-27 DIAGNOSIS — R519 Headache, unspecified: Secondary | ICD-10-CM

## 2015-04-27 DIAGNOSIS — S83242D Other tear of medial meniscus, current injury, left knee, subsequent encounter: Secondary | ICD-10-CM

## 2015-04-27 MED ORDER — TRAMADOL HCL 50 MG PO TABS: 50.0000 mg | ORAL_TABLET | Freq: Four times a day (QID) | ORAL | Status: DC | PRN

## 2015-04-27 MED ORDER — CYCLOBENZAPRINE HCL 5 MG PO TABS: 5.0000 mg | ORAL_TABLET | Freq: Three times a day (TID) | ORAL | Status: DC | PRN

## 2015-04-27 NOTE — Progress Notes (Signed)
Pre visit review using our clinic review tool, if applicable. No additional management support is needed unless otherwise documented below in the visit note. 

## 2015-04-27 NOTE — Patient Instructions (Signed)
Please take all new medication as prescribed  - the muscle relaxer and pain medication if needed  Please continue all other medications as before, and refills have been done if requested.  Please have the pharmacy call with any other refills you may need..  Please keep your appointments with your specialists as you may have planned

## 2015-04-27 NOTE — Progress Notes (Signed)
[facility]  D.O. [facility] [address] [place], [place] [address] Phone: [phone]) [phone] Subjective:     CC:  Left Knee pain  OZH:[id number]:Subjective Hannah Thompson is a 45 y.o. female coming in with complaint of knee pain. Patient was found to have an acute on chronic tear of the medial meniscus. Seen previously and had been making improvements. Has been doing the exercises fairly regularly. At this time is doing very well. No pain whatsoever. No significant inflammation or swelling. No locking or giving out on her.  Still mild hip pain. Continues to be on the anticoagulation.  Past Medical History  Diagnosis Date  . Headache(784.0) 06/21/2009    recurrent  . VITAMIN B12 DEFICIENCY 03/18/2009  . ASTHMA 03/17/2009  . ALLERGIC RHINITIS 03/17/2009  . ECZEMA 03/17/2009  . DISC DISEASE, LUMBAR 03/17/2009    mild facet arthropathy by MRI 2005  . HYPERLIPIDEMIA 03/17/2009  . ANEMIA-NOS 03/17/2009  . KELOID 06/21/2009   Past Surgical History  Procedure Laterality Date  . Keloid excision  mid 1990's    s/p keloid removal-laser    Social History  Substance Use Topics  . Smoking status: Former Smoker -- 0.25 packs/day for 4 years    Types: Cigarettes    Start date: 03/17/2004    Quit date: 04/17/2008  . Smokeless tobacco: Never Used     Comment: quit 5 years ago  . Alcohol Use: No     Comment: socially   Allergies  Allergen Reactions  . Oxaprozin     angioedema  . Sulfonamide Derivatives     REACTION: hives  . Orange Juice [Orange Oil] Hives    Orange juice  . Tomato Hives    tomatoes  . Nsaids Rash   Family History  Problem Relation Age of Onset  . Cancer Father     Prostate Cancer  . Hypertension Father   . Allergies Sister   . Cancer Paternal Aunt     Ovarian Cancer  . Cancer Other     Grandparent-Lung Cancer  . Diabetes Other     Grandparent  . Heart disease Other     Grandparent  . Alcohol abuse Other     Several on both sides of family-3 uncles  .  Hypertension Mother   . Diabetes Mother   . Deep vein thrombosis Sister      Past medical history, social, surgical and family history all reviewed in electronic medical record.   Review of Systems: No headache, visual changes, nausea, vomiting, diarrhea, constipation, dizziness, abdominal pain, skin rash, fevers, chills, night sweats, weight loss, swollen lymph nodes, body aches, joint swelling, muscle aches, chest pain, shortness of breath, mood changes.   Objective Blood pressure 122/80, pulse 68, weight 265 lb (120.203 kg).  General: No apparent distress alert and oriented x3 mood and affect normal, dressed appropriately. Obese, Tearful HEENT: Pupils equal, extraocular movements intact  Respiratory: Patient's speak in full sentences and does not appear short of breath  Cardiovascular: No lower extremity edema, non tender, no erythema  Skin: Warm dry intact with no signs of infection or rash on extremities or on axial skeleton.  Abdomen: Soft nontender  Neuro: Cranial nerves II through XII are intact, neurovascularly intact in all extremities with 2+ DTRs and 2+ pulses.  Lymph: No lymphadenopathy of posterior or anterior cervical chain or axillae bilaterally.  Gait normal with good balance and coordination.  MSK:  Non tender with full range of motion and good stability and symmetric strength  and tone of shoulders, elbows, wrist,  and ankles bilaterally.  Knee: left  No effusion noted nontender Range of motion was last 5 of flexion Ligaments with solid consistent endpoints including ACL, PCL, LCL, MCL. Negative Thessaly sign which is an improvement. Mild painful patellar compression. Patellar glide without crepitus. Patellar and quadriceps tendons unremarkable. Hamstring and quadriceps strength is normal.  Contralateral knee unremarkable      Impression and Recommendations:     This case required medical decision making of moderate complexity.

## 2015-04-27 NOTE — Progress Notes (Signed)
Subjective:    Patient ID: Hannah Thompson, female    DOB: 02-Jun-1970, 45 y.o.   MRN: [id number]  HPI   C/o 3 days onset left forehead HA, sharp, not throbbing but sharp, assoc with dizziness and blurry vision one time earlier this am, severe, lasted 5 sec, has been about 3-4 times per day, no photophobia, no n/v, no recent trauma.  No fever. Does not wake her up.  Exertion not worse.  Nothing makes better.     Past Medical History  Diagnosis Date  . Headache(784.0) 06/21/2009    recurrent  . VITAMIN B12 DEFICIENCY 03/18/2009  . ASTHMA 03/17/2009  . ALLERGIC RHINITIS 03/17/2009  . ECZEMA 03/17/2009  . DISC DISEASE, LUMBAR 03/17/2009    mild facet arthropathy by MRI 2005  . HYPERLIPIDEMIA 03/17/2009  . ANEMIA-NOS 03/17/2009  . KELOID 06/21/2009   Past Surgical History  Procedure Laterality Date  . Keloid excision  mid 1990's    s/p keloid removal-laser     reports that she quit smoking about 7 years ago. Her smoking use included Cigarettes. She started smoking about 11 years ago. She has a 1 pack-year smoking history. She has never used smokeless tobacco. She reports that she does not drink alcohol or use illicit drugs. family history includes Alcohol abuse in her other; Allergies in her sister; Cancer in her father, other, and paternal aunt; Deep vein thrombosis in her sister; Diabetes in her mother and other; Heart disease in her other; Hypertension in her father and mother. Allergies  Allergen Reactions  . Oxaprozin     angioedema  . Sulfonamide Derivatives     REACTION: hives  . Orange Juice [Orange Oil] Hives    Orange juice  . Tomato Hives    tomatoes  . Nsaids Rash   Current Outpatient Prescriptions on File Prior to Visit  Medication Sig Dispense Refill  . acetaminophen (TYLENOL) 325 MG tablet Take 2 tablets (650 mg total) by mouth every 6 (six) hours as needed for mild pain, moderate pain or headache (or Fever >/= 101).    . cetirizine (ZYRTEC) 10 MG tablet Take 1 tablet  (10 mg total) by mouth daily. 30 tablet 11  . cyanocobalamin 2000 MCG tablet Take 2,000 mcg by mouth daily.    Marland Kitchen desonide (DESOWEN) 0.05 % ointment Apply 1 application topically 2 (two) times daily.     Marland Kitchen EPINEPHrine (EPI-PEN) 0.3 mg/0.3 mL DEVI Inject 0.3 mg into the muscle daily as needed (Anaphylaxis).    Marland Kitchen EPINEPHrine 0.3 mg/0.3 mL IJ SOAJ injection Inject 0.3 mLs (0.3 mg total) into the muscle once. 1 Device 1  . hydrochlorothiazide (MICROZIDE) 12.5 MG capsule Take 1 capsule (12.5 mg total) by mouth daily. 30 capsule 11  . hydrochlorothiazide (MICROZIDE) 12.5 MG capsule     . hydrOXYzine (ATARAX/VISTARIL) 25 MG tablet Take 25 mg by mouth 3 (three) times daily as needed for anxiety or itching.    . ranitidine (ZANTAC) 150 MG tablet Take 150 mg by mouth 2 (two) times daily.    . traMADol (ULTRAM) 50 MG tablet Take 1 tablet (50 mg total) by mouth at bedtime as needed. 30 tablet 0  . triamcinolone (NASACORT AQ) 55 MCG/ACT AERO nasal inhaler Place 2 sprays into the nose daily. 1 Inhaler 12  . triamcinolone cream (KENALOG) 0.1 % Apply 1 application topically 2 (two) times daily.     Marland Kitchen warfarin (COUMADIN) 5 MG tablet Take as directed by anticoagulation clinic 105 tablet 0  No current facility-administered medications on file prior to visit.   Review of Systems  Constitutional: Negative for unusual diaphoresis or night sweats HENT: Negative for ear swelling or discharge Eyes: Negative for worsening visual haziness  Respiratory: Negative for choking and stridor.   Gastrointestinal: Negative for distension or worsening eructation Genitourinary: Negative for retention or change in urine volume.  Musculoskeletal: Negative for other MSK pain or swelling Skin: Negative for color change and worsening wound Neurological: Negative for tremors and numbness other than noted  Psychiatric/Behavioral: Negative for decreased concentration or agitation other than above       Objective:   Physical  Exam BP 124/72 mmHg  Pulse 83  Temp(Src) 97.9 F (36.6 C) (Oral)  Resp 20  Wt 265 lb (120.203 kg)  SpO2 98% VS noted,  Constitutional: Pt appears in no apparent distress HENT: Head: NCAT.  Right Ear: External ear normal.  Left Ear: External ear normal.  Eyes: . Pupils are equal, round, and reactive to light. Conjunctivae and EOM are normal Neck: Normal range of motion. Neck supple.  Cardiovascular: Normal rate and regular rhythm.   Pulmonary/Chest: Effort normal and breath sounds without rales or wheezing.  Abd:  Soft, NT, ND, + BS Neurological: Pt is alert. Not confused , motor 5/5 intact, cn 2-12 intact, sens/dtr/gait intact Skin: Skin is warm. No rash, no LE edema Psychiatric: Pt behavior is normal. No agitation.      Assessment & Plan:

## 2015-04-27 NOTE — Patient Instructions (Signed)
Knee is great  Keep it up Elliptical, biking and swimming better for cardio See me when you need me or if hip gets worse.

## 2015-04-27 NOTE — Assessment & Plan Note (Signed)
Etiology unclear, pt mostly worried could be related to her anticoag tx or somehow another blood clotting, but suspect MSK related as not typical for migraine, cluster or tension type (though latter cannot be ruled out), for pain control, muscle relaxer prn

## 2015-04-27 NOTE — Assessment & Plan Note (Signed)
Patient doing very well at this time. We discussed icing regimen and home exercises. We discussed which activities to do an which was to avoid. As long as patient does well she can follow-up on an as-needed basis. Once again if any recurrent swelling occurs we may also want to consider autoimmune labs.

## 2015-05-07 ENCOUNTER — Ambulatory Visit: Payer: BLUE CROSS/BLUE SHIELD

## 2015-05-07 ENCOUNTER — Ambulatory Visit (INDEPENDENT_AMBULATORY_CARE_PROVIDER_SITE_OTHER): Payer: BLUE CROSS/BLUE SHIELD | Admitting: General Practice

## 2015-05-07 DIAGNOSIS — Z7901 Long term (current) use of anticoagulants: Secondary | ICD-10-CM | POA: Diagnosis not present

## 2015-05-07 DIAGNOSIS — Z5181 Encounter for therapeutic drug level monitoring: Secondary | ICD-10-CM | POA: Diagnosis not present

## 2015-05-07 LAB — POCT INR: INR: 1.2

## 2015-05-07 NOTE — Progress Notes (Signed)
Pre visit review using our clinic review tool, if applicable. No additional management support is needed unless otherwise documented below in the visit note. 

## 2015-05-21 ENCOUNTER — Ambulatory Visit: Payer: BLUE CROSS/BLUE SHIELD

## 2015-05-21 ENCOUNTER — Ambulatory Visit (INDEPENDENT_AMBULATORY_CARE_PROVIDER_SITE_OTHER): Payer: BLUE CROSS/BLUE SHIELD | Admitting: General Practice

## 2015-05-21 DIAGNOSIS — Z7901 Long term (current) use of anticoagulants: Secondary | ICD-10-CM

## 2015-05-21 DIAGNOSIS — Z5181 Encounter for therapeutic drug level monitoring: Secondary | ICD-10-CM

## 2015-05-21 LAB — POCT INR: INR: 1.4

## 2015-05-21 NOTE — Progress Notes (Signed)
I have reviewed and agree with the plan. 

## 2015-05-21 NOTE — Progress Notes (Signed)
Pre visit review using our clinic review tool, if applicable. No additional management support is needed unless otherwise documented below in the visit note. 

## 2015-05-27 ENCOUNTER — Other Ambulatory Visit: Payer: Self-pay | Admitting: Internal Medicine

## 2015-05-27 MED ORDER — EPINEPHRINE 0.3 MG/0.3ML IJ DEVI: 0.3000 mg | Freq: Every day | INTRAMUSCULAR | Status: DC | PRN

## 2015-05-27 NOTE — Addendum Note (Signed)
Addended by: Corwin LevinsJOHN, JAMES W on: 05/27/2015 12:13 PM   Modules accepted: Orders

## 2015-06-11 ENCOUNTER — Ambulatory Visit: Payer: BLUE CROSS/BLUE SHIELD

## 2015-06-18 ENCOUNTER — Ambulatory Visit: Payer: BLUE CROSS/BLUE SHIELD

## 2015-06-23 ENCOUNTER — Ambulatory Visit (INDEPENDENT_AMBULATORY_CARE_PROVIDER_SITE_OTHER): Payer: BLUE CROSS/BLUE SHIELD | Admitting: General Practice

## 2015-06-23 DIAGNOSIS — Z5181 Encounter for therapeutic drug level monitoring: Secondary | ICD-10-CM | POA: Diagnosis not present

## 2015-06-23 DIAGNOSIS — Z7901 Long term (current) use of anticoagulants: Secondary | ICD-10-CM

## 2015-06-23 LAB — POCT INR: INR: 2

## 2015-06-23 NOTE — Progress Notes (Signed)
Pre visit review using our clinic review tool, if applicable. No additional management support is needed unless otherwise documented below in the visit note. 

## 2015-06-23 NOTE — Progress Notes (Signed)
I have reviewed and agree with the plan. 

## 2015-07-08 ENCOUNTER — Other Ambulatory Visit: Payer: Self-pay | Admitting: Internal Medicine

## 2015-07-21 ENCOUNTER — Ambulatory Visit: Payer: BLUE CROSS/BLUE SHIELD

## 2015-07-26 ENCOUNTER — Telehealth: Payer: Self-pay | Admitting: Internal Medicine

## 2015-07-26 ENCOUNTER — Ambulatory Visit: Payer: BLUE CROSS/BLUE SHIELD | Admitting: Internal Medicine

## 2015-07-26 NOTE — Telephone Encounter (Signed)
TELEPHONE ADVICE RECORD [facility]  Patient Name: Hannah Thompson  DOB: 07-05-1970    Initial Comment Caller is late for appt and no other appts available. Was transferred to nurse line to speak to advice nurse. Sx twisted ankle and leg is swollen   Nurse Assessment  Nurse: Odis Luster, RN, Bjorn Loser Date/Time (Eastern Time): 07/26/2015 4:12:37 PM  Confirm and document reason for call. If symptomatic, describe symptoms. You must click the next button to save text entered. ---Sx twisted ankle and leg is swollen. Reports that she fell into a hole yesterday. When she woke this morning, it was swollen even more. She is able to walk on the foot but it is painful.  Has the patient traveled out of the country within the last 30 days? ---Not Applicable  Does the patient have any new or worsening symptoms? ---Yes  Will a triage be completed? ---Yes  Related visit to physician within the last 2 weeks? ---No  Does the PT have any chronic conditions? (i.e. diabetes, asthma, etc.) ---Yes  List chronic conditions. ---takes Coumadin, hx of blood clots;  Is the patient pregnant or possibly pregnant? (Ask all females between the ages of 29-55) ---No  Is this a behavioral health or substance abuse call? ---No     Guidelines    Guideline Title Affirmed Question Affirmed Notes  Leg Swelling and Edema [1] Thigh or calf pain AND [2] only 1 side AND [3] present > 1 hour    Final Disposition User   See Physician within 4 Hours (or PCP triage) Odis Luster, RN, Bjorn Loser    Comments  Caller requested MD appt for tomorrow. States that she doesn't want to go to [facility] clinic tonight, feels that she can wait until tomorrow.. Scheduled caller appt with Dr. Carver Fila for 07/27/15 @ 11:45am at the [facility] office. Advised caller to call back for any new or wosening symptoms. Voiced understanding.   Referrals  GO TO FACILITY REFUSED   Disagree/Comply: Disagree  Disagree/Comply Reason: Wait and see

## 2015-07-27 ENCOUNTER — Encounter: Payer: Self-pay | Admitting: Family

## 2015-07-27 ENCOUNTER — Ambulatory Visit (INDEPENDENT_AMBULATORY_CARE_PROVIDER_SITE_OTHER): Payer: BLUE CROSS/BLUE SHIELD | Admitting: Family

## 2015-07-27 DIAGNOSIS — S93401A Sprain of unspecified ligament of right ankle, initial encounter: Secondary | ICD-10-CM | POA: Diagnosis not present

## 2015-07-27 DIAGNOSIS — S93409A Sprain of unspecified ligament of unspecified ankle, initial encounter: Secondary | ICD-10-CM | POA: Insufficient documentation

## 2015-07-27 DIAGNOSIS — IMO0001 Reserved for inherently not codable concepts without codable children: Secondary | ICD-10-CM

## 2015-07-27 NOTE — Assessment & Plan Note (Signed)
Symptoms and exam consistent with grade 2 ankle sprain of the anterior talofibular ligament. Treat conservatively with ice, Tylenol as needed for discomfort, elevation, compression, and home exercise therapy. There is a possible complication of warfarin. Aircast provided for support. Instructed to wear good supportive shoes. Follow-up in 2 weeks or sooner if needed if symptoms do not continue to improve.

## 2015-07-27 NOTE — Progress Notes (Signed)
Subjective:    Patient ID: Hannah Thompson, female    DOB: Mar 18, 1970, 45 y.o.   MRN: [id number]  Chief Complaint  Patient presents with  . Fall    fell sunday and hurt her right ankle, still having pain     HPI:  Hannah Thompson is a 45 y.o. female who  has a past medical history of ALLERGIC RHINITIS (03/17/2009); ANEMIA-NOS (03/17/2009); ASTHMA (03/17/2009); DISC DISEASE, LUMBAR (03/17/2009); ECZEMA (03/17/2009); UVOZDGUY(403.4) (06/21/2009); HYPERLIPIDEMIA (03/17/2009); KELOID (06/21/2009); and VITAMIN B12 DEFICIENCY (03/18/2009). and presents today For an acute office visit.  This is a new problem. Associated symptom of pain located in her right ankle has been going on for approximately 3 days following a fall which resulted in her inverting her ankle. Denies any sounds/sensation heard or felt. Pain is described as sharp initially but now is achy. Modifying factors include Tylenol and ice. Course of the symptoms has improved slightly since initial onset. Denies previous ankle injury to either ankle. No numbness or tingling. She is currently on warfarin for pulmonary embolism and DVT.    Allergies  Allergen Reactions  . Oxaprozin     angioedema  . Sulfonamide Derivatives     REACTION: hives  . Orange Juice [Orange Oil] Hives    Orange juice  . Tomato Hives    tomatoes  . Nsaids Rash     Current Outpatient Prescriptions on File Prior to Visit  Medication Sig Dispense Refill  . acetaminophen (TYLENOL) 325 MG tablet Take 2 tablets (650 mg total) by mouth every 6 (six) hours as needed for mild pain, moderate pain or headache (or Fever >/= 101).    . cetirizine (ZYRTEC) 10 MG tablet Take 1 tablet (10 mg total) by mouth daily. 30 tablet 11  . cyanocobalamin 2000 MCG tablet Take 2,000 mcg by mouth daily.    . cyclobenzaprine (FLEXERIL) 5 MG tablet Take 1 tablet (5 mg total) by mouth 3 (three) times daily as needed for muscle spasms. 60 tablet 1  . desonide (DESOWEN) 0.05 % ointment Apply 1  application topically 2 (two) times daily.     Marland Kitchen EPINEPHrine (EPI-PEN) 0.3 mg/0.3 mL DEVI Inject 0.3 mLs (0.3 mg total) into the muscle daily as needed (Anaphylaxis). 2 Device 1  . EPINEPHrine 0.3 mg/0.3 mL IJ SOAJ injection Inject 0.3 mLs (0.3 mg total) into the muscle once. 1 Device 1  . hydrochlorothiazide (MICROZIDE) 12.5 MG capsule     . hydrOXYzine (ATARAX/VISTARIL) 25 MG tablet Take 25 mg by mouth 3 (three) times daily as needed for anxiety or itching.    . ranitidine (ZANTAC) 150 MG tablet Take 150 mg by mouth 2 (two) times daily.    . traMADol (ULTRAM) 50 MG tablet Take 1 tablet (50 mg total) by mouth every 6 (six) hours as needed. 60 tablet 1  . triamcinolone (NASACORT AQ) 55 MCG/ACT AERO nasal inhaler Place 2 sprays into the nose daily. 1 Inhaler 12  . triamcinolone cream (KENALOG) 0.1 % Apply 1 application topically 2 (two) times daily.     Marland Kitchen warfarin (COUMADIN) 5 MG tablet Take as directed by anticoagulation clinic 105 tablet 0   No current facility-administered medications on file prior to visit.      Past Surgical History:  Procedure Laterality Date  . KELOID EXCISION  mid 1990's   s/p keloid removal-laser     Past Medical History:  Diagnosis Date  . ALLERGIC RHINITIS 03/17/2009  . ANEMIA-NOS 03/17/2009  . ASTHMA 03/17/2009  .  DISC DISEASE, LUMBAR 03/17/2009   mild facet arthropathy by MRI 2005  . ECZEMA 03/17/2009  . Headache(784.0) 06/21/2009   recurrent  . HYPERLIPIDEMIA 03/17/2009  . KELOID 06/21/2009  . VITAMIN B12 DEFICIENCY 03/18/2009     Review of Systems  Constitutional: Negative for chills and fever.  Musculoskeletal: Positive for joint swelling.       Positive for ankle pain.  Neurological: Negative for weakness and numbness.  Psychiatric/Behavioral: Positive for suicidal ideas.      Objective:    BP 122/84 (BP Location: Left Arm, Patient Position: Sitting, Cuff Size: Large)   Pulse 69   Temp 98.5 F (36.9 C) (Oral)   Resp 16   Ht  (1.702 m)    Wt 285 lb (129.3 kg)   SpO2 97%   BMI 44.64 kg/m  Nursing note and vital signs reviewed.  Physical Exam  Constitutional: She is oriented to person, place, and time. She appears well-developed and well-nourished. No distress.  Cardiovascular: Normal rate, regular rhythm, normal heart sounds and intact distal pulses.   Pulmonary/Chest: Effort normal and breath sounds normal.  Musculoskeletal:  Right ankle - no obvious deformity or discoloration with moderate amount of edema located laterally. There is tenderness over the calcaneofibular and anterior talofibular ligaments. Range of motion is slightly restricted secondary to discomfort and edema. Dorsalis pedis and tibialis posterior pulses are intact and appropriate. Strength is 4+ and limited secondary to pain. Positive anterior drawer. Negative talar tilt. Negative Kleigers.   Neurological: She is alert and oriented to person, place, and time.  Skin: Skin is warm and dry.  Psychiatric: She has a normal mood and affect. Her behavior is normal. Judgment and thought content normal.       Assessment & Plan:   Problem List Items Addressed This Visit      Musculoskeletal and Integument   Grade 2 ankle sprain    Symptoms and exam consistent with grade 2 ankle sprain of the anterior talofibular ligament. Treat conservatively with ice, Tylenol as needed for discomfort, elevation, compression, and home exercise therapy. There is a possible complication of warfarin. Aircast provided for support. Instructed to wear good supportive shoes. Follow-up in 2 weeks or sooner if needed if symptoms do not continue to improve.       Other Visit Diagnoses   None.      I am having Ms. Cordner maintain her hydrOXYzine, ranitidine, cyanocobalamin, acetaminophen, desonide, triamcinolone cream, cetirizine, triamcinolone, EPINEPHrine, warfarin, hydrochlorothiazide, traMADol, cyclobenzaprine, and EPINEPHrine.   Follow-up: Return in about 2 weeks (around  08/10/2015), or if symptoms worsen or fail to improve.  Jeanine Luz, FNP

## 2015-07-27 NOTE — Patient Instructions (Signed)
Thank you for choosing [facility].  Summary/Instructions:  Ice x 20 min every 2 hours and after activity.  Exercises daily.  Elevate and compression.  Wear a good supportive shoe.  If your symptoms worsen or fail to improve, please contact our office for further instruction, or in case of emergency go directly to the emergency room at the closest medical facility.     Acute Ankle Sprain With Phase I Rehab An acute ankle sprain is a partial or complete tear in one or more of the ligaments of the ankle due to traumatic injury. The severity of the injury depends on both the number of ligaments sprained and the grade of sprain. There are 3 grades of sprains.   A grade 1 sprain is a mild sprain. There is a slight pull without obvious tearing. There is no loss of strength, and the muscle and ligament are the correct length.  A grade 2 sprain is a moderate sprain. There is tearing of fibers within the substance of the ligament where it connects two bones or two cartilages. The length of the ligament is increased, and there is usually decreased strength.  A grade 3 sprain is a complete rupture of the ligament and is uncommon. In addition to the grade of sprain, there are three types of ankle sprains.  Lateral ankle sprains: This is a sprain of one or more of the three ligaments on the outer side (lateral) of the ankle. These are the most common sprains. Medial ankle sprains: There is one large triangular ligament of the inner side (medial) of the ankle that is susceptible to injury. Medial ankle sprains are less common. Syndesmosis, "high ankle," sprains: The syndesmosis is the ligament that connects the two bones of the lower leg. Syndesmosis sprains usually only occur with very severe ankle sprains. SYMPTOMS  Pain, tenderness, and swelling in the ankle, starting at the side of injury that may progress to the whole ankle and foot with time.  "Pop" or tearing sensation at the time of  injury.  Bruising that may spread to the heel.  Impaired ability to walk soon after injury. CAUSES   Acute ankle sprains are caused by trauma placed on the ankle that temporarily forces or pries the anklebone (talus) out of its normal socket.  Stretching or tearing of the ligaments that normally hold the joint in place (usually due to a twisting injury). RISK INCREASES WITH:  Previous ankle sprain.  Sports in which the foot may land awkwardly (i.e., basketball, volleyball, or soccer) or walking or running on uneven or rough surfaces.  Shoes with inadequate support to prevent sideways motion when stress occurs.  Poor strength and flexibility.  Poor balance skills.  Contact sports. PREVENTION   Warm up and stretch properly before activity.  Maintain physical fitness:  Ankle and leg flexibility, muscle strength, and endurance.  Cardiovascular fitness.  Balance training activities.  Use proper technique and have a coach correct improper technique.  Taping, protective strapping, bracing, or high-top tennis shoes may help prevent injury. Initially, tape is best; however, it loses most of its support function within 10 to 15 minutes.  Wear proper-fitted protective shoes (High-top shoes with taping or bracing is more effective than either alone).  Provide the ankle with support during sports and practice activities for 12 months following injury. PROGNOSIS   If treated properly, ankle sprains can be expected to recover completely; however, the length of recovery depends on the degree of injury.  A grade 1 sprain  usually heals enough in 5 to 7 days to allow modified activity and requires an average of 6 weeks to heal completely.  A grade 2 sprain requires 6 to 10 weeks to heal completely.  A grade 3 sprain requires 12 to 16 weeks to heal.  A syndesmosis sprain often takes more than 3 months to heal. RELATED COMPLICATIONS   Frequent recurrence of symptoms may result in a  chronic problem. Appropriately addressing the problem the first time decreases the frequency of recurrence and optimizes healing time. Severity of the initial sprain does not predict the likelihood of later instability.  Injury to other structures (bone, cartilage, or tendon).  A chronically unstable or arthritic ankle joint is a possibility with repeated sprains. TREATMENT Treatment initially involves the use of ice, medication, and compression bandages to help reduce pain and inflammation. Ankle sprains are usually immobilized in a walking cast or boot to allow for healing. Crutches may be recommended to reduce pressure on the injury. After immobilization, strengthening and stretching exercises may be necessary to regain strength and a full range of motion. Surgery is rarely needed to treat ankle sprains. MEDICATION   Nonsteroidal anti-inflammatory medications, such as aspirin and ibuprofen (do not take for the first 3 days after injury or within 7 days before surgery), or other minor pain relievers, such as acetaminophen, are often recommended. Take these as directed by your caregiver. Contact your caregiver immediately if any bleeding, stomach upset, or signs of an allergic reaction occur from these medications.  Ointments applied to the skin may be helpful.  Pain relievers may be prescribed as necessary by your caregiver. Do not take prescription pain medication for longer than 4 to 7 days. Use only as directed and only as much as you need. HEAT AND COLD  Cold treatment (icing) is used to relieve pain and reduce inflammation for acute and chronic cases. Cold should be applied for 10 to 15 minutes every 2 to 3 hours for inflammation and pain and immediately after any activity that aggravates your symptoms. Use ice packs or an ice massage.  Heat treatment may be used before performing stretching and strengthening activities prescribed by your caregiver. Use a heat pack or a warm soak. SEEK  IMMEDIATE MEDICAL CARE IF:   Pain, swelling, or bruising worsens despite treatment.  You experience pain, numbness, discoloration, or coldness in the foot or toes.  New, unexplained symptoms develop (drugs used in treatment may produce side effects.) EXERCISES  PHASE I EXERCISES RANGE OF MOTION (ROM) AND STRETCHING EXERCISES - Ankle Sprain, Acute Phase I, Weeks 1 to 2 These exercises may help you when beginning to restore flexibility in your ankle. You will likely work on these exercises for the 1 to 2 weeks after your injury. Once your physician, physical therapist, or athletic trainer sees adequate progress, he or she will advance your exercises. While completing these exercises, remember:   Restoring tissue flexibility helps normal motion to return to the joints. This allows healthier, less painful movement and activity.  An effective stretch should be held for at least 30 seconds.  A stretch should never be painful. You should only feel a gentle lengthening or release in the stretched tissue. RANGE OF MOTION - Dorsi/Plantar Flexion  While sitting with your right / left knee straight, draw the top of your foot upwards by flexing your ankle. Then reverse the motion, pointing your toes downward.  Hold each position for __________ seconds.  After completing your first set of exercises, repeat  this exercise with your knee bent. Repeat __________ times. Complete this exercise __________ times per day.  RANGE OF MOTION - Ankle Alphabet  Imagine your right / left big toe is a pen.  Keeping your hip and knee still, write out the entire alphabet with your "pen." Make the letters as large as you can without increasing any discomfort. Repeat __________ times. Complete this exercise __________ times per day.  STRENGTHENING EXERCISES - Ankle Sprain, Acute -Phase I, Weeks 1 to 2 These exercises may help you when beginning to restore strength in your ankle. You will likely work on these exercises  for 1 to 2 weeks after your injury. Once your physician, physical therapist, or athletic trainer sees adequate progress, he or she will advance your exercises. While completing these exercises, remember:   Muscles can gain both the endurance and the strength needed for everyday activities through controlled exercises.  Complete these exercises as instructed by your physician, physical therapist, or athletic trainer. Progress the resistance and repetitions only as guided.  You may experience muscle soreness or fatigue, but the pain or discomfort you are trying to eliminate should never worsen during these exercises. If this pain does worsen, stop and make certain you are following the directions exactly. If the pain is still present after adjustments, discontinue the exercise until you can discuss the trouble with your clinician. STRENGTH - Dorsiflexors  Secure a rubber exercise band/tubing to a fixed object (i.e., table, pole) and loop the other end around your right / left foot.  Sit on the floor facing the fixed object. The band/tubing should be slightly tense when your foot is relaxed.  Slowly draw your foot back toward you using your ankle and toes.  Hold this position for __________ seconds. Slowly release the tension in the band and return your foot to the starting position. Repeat __________ times. Complete this exercise __________ times per day.  STRENGTH - Plantar-flexors   Sit with your right / left leg extended. Holding onto both ends of a rubber exercise band/tubing, loop it around the ball of your foot. Keep a slight tension in the band.  Slowly push your toes away from you, pointing them downward.  Hold this position for __________ seconds. Return slowly, controlling the tension in the band/tubing. Repeat __________ times. Complete this exercise __________ times per day.  STRENGTH - Ankle Eversion  Secure one end of a rubber exercise band/tubing to a fixed object (table,  pole). Loop the other end around your foot just before your toes.  Place your fists between your knees. This will focus your strengthening at your ankle.  Drawing the band/tubing across your opposite foot, slowly, pull your little toe out and up. Make sure the band/tubing is positioned to resist the entire motion.  Hold this position for __________ seconds. Have your muscles resist the band/tubing as it slowly pulls your foot back to the starting position.  Repeat __________ times. Complete this exercise __________ times per day.  STRENGTH - Ankle Inversion  Secure one end of a rubber exercise band/tubing to a fixed object (table, pole). Loop the other end around your foot just before your toes.  Place your fists between your knees. This will focus your strengthening at your ankle.  Slowly, pull your big toe up and in, making sure the band/tubing is positioned to resist the entire motion.  Hold this position for __________ seconds.  Have your muscles resist the band/tubing as it slowly pulls your foot back to the starting  position. Repeat __________ times. Complete this exercises __________ times per day.  STRENGTH - Towel Curls  Sit in a chair positioned on a non-carpeted surface.  Place your right / left foot on a towel, keeping your heel on the floor.  Pull the towel toward your heel by only curling your toes. Keep your heel on the floor.  If instructed by your physician, physical therapist, or athletic trainer, add weight to the end of the towel. Repeat __________ times. Complete this exercise __________ times per day.   This information is not intended to replace advice given to you by your health care provider. Make sure you discuss any questions you have with your health care provider.   Document Released: 07/20/2004 Document Revised: 01/09/2014 Document Reviewed: 04/02/2008 Elsevier Interactive Patient Education [employer]

## 2015-07-28 ENCOUNTER — Other Ambulatory Visit: Payer: Self-pay | Admitting: General Practice

## 2015-07-28 ENCOUNTER — Ambulatory Visit (INDEPENDENT_AMBULATORY_CARE_PROVIDER_SITE_OTHER): Payer: BLUE CROSS/BLUE SHIELD | Admitting: General Practice

## 2015-07-28 DIAGNOSIS — Z5181 Encounter for therapeutic drug level monitoring: Secondary | ICD-10-CM

## 2015-07-28 DIAGNOSIS — Z7901 Long term (current) use of anticoagulants: Secondary | ICD-10-CM | POA: Diagnosis not present

## 2015-07-28 LAB — POCT INR: INR: 1.1

## 2015-07-28 MED ORDER — WARFARIN SODIUM 5 MG PO TABS: ORAL_TABLET | ORAL | 0 refills | Status: DC

## 2015-07-28 NOTE — Progress Notes (Signed)
I have reviewed and agree with the plan. 

## 2015-08-16 ENCOUNTER — Ambulatory Visit (HOSPITAL_BASED_OUTPATIENT_CLINIC_OR_DEPARTMENT_OTHER)
Admission: RE | Admit: 2015-08-16 | Discharge: 2015-08-16 | Disposition: A | Discharge status: Home / Self Care | Payer: BLUE CROSS/BLUE SHIELD | Source: Ambulatory Visit | Attending: Hematology & Oncology | Admitting: Hematology & Oncology

## 2015-08-16 ENCOUNTER — Other Ambulatory Visit (HOSPITAL_BASED_OUTPATIENT_CLINIC_OR_DEPARTMENT_OTHER): Payer: BLUE CROSS/BLUE SHIELD

## 2015-08-16 ENCOUNTER — Encounter: Payer: Self-pay | Admitting: Hematology & Oncology

## 2015-08-16 ENCOUNTER — Ambulatory Visit (HOSPITAL_BASED_OUTPATIENT_CLINIC_OR_DEPARTMENT_OTHER): Payer: BLUE CROSS/BLUE SHIELD | Admitting: Hematology & Oncology

## 2015-08-16 ENCOUNTER — Other Ambulatory Visit: Payer: BLUE CROSS/BLUE SHIELD

## 2015-08-16 ENCOUNTER — Ambulatory Visit: Payer: BLUE CROSS/BLUE SHIELD | Admitting: Hematology & Oncology

## 2015-08-16 VITALS — BP 141/87 | HR 54 | Temp 97.5°F | Resp 18 | Ht 67.0 in | Wt 282.0 lb

## 2015-08-16 DIAGNOSIS — Z7901 Long term (current) use of anticoagulants: Secondary | ICD-10-CM

## 2015-08-16 DIAGNOSIS — I82491 Acute embolism and thrombosis of other specified deep vein of right lower extremity: Secondary | ICD-10-CM

## 2015-08-16 DIAGNOSIS — D696 Thrombocytopenia, unspecified: Secondary | ICD-10-CM

## 2015-08-16 DIAGNOSIS — Z79811 Long term (current) use of aromatase inhibitors: Secondary | ICD-10-CM

## 2015-08-16 DIAGNOSIS — Z86718 Personal history of other venous thrombosis and embolism: Secondary | ICD-10-CM | POA: Diagnosis not present

## 2015-08-16 DIAGNOSIS — I2692 Saddle embolus of pulmonary artery without acute cor pulmonale: Secondary | ICD-10-CM

## 2015-08-16 LAB — CBC WITH DIFFERENTIAL (CANCER CENTER ONLY)
BASO#: 0 10*3/uL (ref 0.0–0.2)
BASO%: 0.2 % (ref 0.0–2.0)
EOS%: 6.4 % (ref 0.0–7.0)
Eosinophils Absolute: 0.3 10*3/uL (ref 0.0–0.5)
HCT: 34.1 % — ABNORMAL LOW (ref 34.8–46.6)
HGB: 11.4 g/dL — ABNORMAL LOW (ref 11.6–15.9)
LYMPH#: 1.9 10*3/uL (ref 0.9–3.3)
LYMPH%: 43.8 % (ref 14.0–48.0)
MCH: 28.3 pg (ref 26.0–34.0)
MCHC: 33.4 g/dL (ref 32.0–36.0)
MCV: 85 fL (ref 81–101)
MONO#: 0.3 10*3/uL (ref 0.1–0.9)
MONO%: 6.2 % (ref 0.0–13.0)
NEUT%: 43.4 % (ref 39.6–80.0)
NEUTROS ABS: 1.8 10*3/uL (ref 1.5–6.5)
Platelets: 171 10*3/uL (ref 145–400)
RBC: 4.03 10*6/uL (ref 3.70–5.32)
RDW: 15.3 % (ref 11.1–15.7)
WBC: 4.2 10*3/uL (ref 3.9–10.0)

## 2015-08-16 LAB — COMPREHENSIVE METABOLIC PANEL
ALT: 18 U/L (ref 0–55)
AST: 21 U/L (ref 5–34)
Albumin: 3.5 g/dL (ref 3.5–5.0)
Alkaline Phosphatase: 69 U/L (ref 40–150)
Anion Gap: 7 mEq/L (ref 3–11)
BUN: 11 mg/dL (ref 7.0–26.0)
CALCIUM: 9.2 mg/dL (ref 8.4–10.4)
CHLORIDE: 107 meq/L (ref 98–109)
CO2: 26 mEq/L (ref 22–29)
Creatinine: 0.8 mg/dL (ref 0.6–1.1)
GLUCOSE: 108 mg/dL (ref 70–140)
POTASSIUM: 3.9 meq/L (ref 3.5–5.1)
SODIUM: 140 meq/L (ref 136–145)
Total Bilirubin: <0.3 mg/dL (ref 0.20–1.20)
Total Protein: 7.6 g/dL (ref 6.4–8.3)

## 2015-08-16 NOTE — Progress Notes (Signed)
[facility]  Telephone:([phone] Fax:([phone]) [phone]  ID: Hannah Thompson: 02-12-1970 MR#: [id number] GNF#:[id number]#:[id number] Patient Care Team: Corwin LevinsJames W John, MD as PCP - General  DIAGNOSIS: Acute PE DVT in right lower extremity Thrombocytopenia - transient Leukopenia - EAL  INTERVAL HISTORY: Hannah Thompson is here today for a follow-up. She is doing pretty well.   She apparently fell and hurt her right leg. I do not know if this was from her left knee causing her problems. There was a little bit of swelling noted.   We will go ahead and get a Doppler of the right leg just to be sure that everything is okay.  She has had no bleeding. There's been no change in bowel or bladder habits. She's had no cough. There's been no chest wall pain.  She has had no fever. She's had no rashes.  Overall, her performance status is ECOG 1.     CURRENT TREATMENT: Coumadin - 2 years of therapy. To be completed in September 2017 Aspirin 162 mg po q day - start after completing coumadin  REVIEW OF SYSTEMS: All other 10 point review of systems is negative.   PAST MEDICAL HISTORY: Past Medical History:  Diagnosis Date  . ALLERGIC RHINITIS 03/17/2009  . ANEMIA-NOS 03/17/2009  . ASTHMA 03/17/2009  . DISC DISEASE, LUMBAR 03/17/2009   mild facet arthropathy by MRI 2005  . ECZEMA 03/17/2009  . Headache(784.0) 06/21/2009   recurrent  . HYPERLIPIDEMIA 03/17/2009  . KELOID 06/21/2009  . VITAMIN B12 DEFICIENCY 03/18/2009    PAST SURGICAL HISTORY: Past Surgical History:  Procedure Laterality Date  . KELOID EXCISION  mid 1990's   s/p keloid removal-laser     FAMILY HISTORY Family History  Problem Relation Age of Onset  . Cancer Father     Prostate Cancer  . Hypertension Father   . Allergies Sister   . Cancer Paternal Aunt     Ovarian Cancer  . Cancer Other     Grandparent-Lung Cancer  . Diabetes Other     Grandparent  . Heart disease Other     Grandparent  . Alcohol abuse Other    Several on both sides of family-3 uncles  . Hypertension Mother   . Diabetes Mother   . Deep vein thrombosis Sister     GYNECOLOGIC HISTORY:  No LMP recorded.   SOCIAL HISTORY:  Social History   Social History Narrative  . No narrative on file   ADVANCED DIRECTIVES: <no information>  HEALTH MAINTENANCE: Social History  Substance Use Topics  . Smoking status: Former Smoker    Packs/day: 0.25    Years: 4.00    Types: Cigarettes    Start date: 03/17/2004    Quit date: 04/17/2008  . Smokeless tobacco: Never Used     Comment: quit 5 years ago  . Alcohol use No     Comment: socially   Colonoscopy: PAP: Bone density: Lipid panel:  Allergies  Allergen Reactions  . Oxaprozin     angioedema  . Sulfonamide Derivatives     REACTION: hives  . Orange Juice [Orange Oil] Hives    Orange juice  . Tomato Hives    tomatoes  . Nsaids Rash    Current Outpatient Prescriptions  Medication Sig Dispense Refill  . acetaminophen (TYLENOL) 325 MG tablet Take 2 tablets (650 mg total) by mouth every 6 (six) hours as needed for mild pain, moderate pain or headache (or Fever >/= 101).    . cetirizine (ZYRTEC) 10  MG tablet Take 1 tablet (10 mg total) by mouth daily. 30 tablet 11  . desonide (DESOWEN) 0.05 % ointment Apply 1 application topically 2 (two) times daily.     . traMADol (ULTRAM) 50 MG tablet Take 1 tablet (50 mg total) by mouth every 6 (six) hours as needed. 60 tablet 1  . triamcinolone (NASACORT AQ) 55 MCG/ACT AERO nasal inhaler Place 2 sprays into the nose daily. 1 Inhaler 12  . triamcinolone cream (KENALOG) 0.1 % Apply 1 application topically 2 (two) times daily.     Marland Kitchen warfarin (COUMADIN) 5 MG tablet Take as directed by anticoagulation clinic 120 tablet 0  . EPINEPHrine (EPI-PEN) 0.3 mg/0.3 mL DEVI Inject 0.3 mLs (0.3 mg total) into the muscle daily as needed (Anaphylaxis). (Patient not taking: Reported on 08/16/2015) 2 Device 1  . hydrochlorothiazide (MICROZIDE) 12.5 MG capsule       No current facility-administered medications for this visit.     OBJECTIVE: Vitals:   08/16/15 1327  BP: (!) 141/87  Pulse: (!) 54  Resp: 18  Temp: 97.5 F (36.4 C)   Body mass index is 44.17 kg/m.   Obese African American female in no obvious distress. Head and neck exam shows no ocular or oral lesions. There are no palpable cervical or supraclavicular lymph nodes. Lungs are clear. Cardiac exam regular rate and rhythm with no murmurs, rubs or bruits. Abdomen is soft. She is obese patients good bowel sounds. There is no fluid wave. There is no palpable liver or spleen tip. Back exam shows no tenderness over the spine, ribs or hips. Extremities shows no clubbing, cyanosis or edema. No venous cord is noted in her legs. She is a negative Homans sign in her legs bilaterally. She has a knee brace on her left knee. Skin exam shows no rashes, ecchymoses or petechia. Neurological exam shows no focal neurological deficits.  LAB RESULTS: CMP     Component Value Date/Time   NA 144 02/24/2014 0922   K 4.2 02/24/2014 0922   CL 104 02/24/2014 0922   CO2 26 02/24/2014 0922   GLUCOSE 98 02/24/2014 0922   BUN 9 02/24/2014 0922   CREATININE 0.6 02/24/2014 0922   CALCIUM 8.8 02/24/2014 0922   PROT 7.1 02/24/2014 0922   ALBUMIN 3.3 02/24/2014 0922   AST 21 02/24/2014 0922   ALT 19 02/24/2014 0922   ALKPHOS 56 02/24/2014 0922   BILITOT 0.50 02/24/2014 0922   GFRNONAA >90 09/14/2013 1904   GFRAA >90 09/14/2013 1904   I No results found for: SPEP Lab Results  Component Value Date   WBC 4.2 08/16/2015   NEUTROABS 1.8 08/16/2015   HGB 11.4 (L) 08/16/2015   HCT 34.1 (L) 08/16/2015   MCV 85 08/16/2015   PLT 171 08/16/2015   No results found for: LABCA2 No components found for: [id number] No results for input(s): INR in the last 168 hours.  STUDIES: None  ASSESSMENT/PLAN: Hannah Thompson is a very pleasant 45 year old African-American female. She has a normal hypercoagulable panel. However,  there is a strong family history of thromboembolic disease.  She has been on Coumadin now for 2 years. I really think that 2 years would be enough for her.  I think that it would be okay to get her on baby aspirin. I think to baby aspirin would be reasonable. I talked to her about baby aspirin. I think to baby aspirin daily with food would be very reasonable.  I will go ahead and get a Doppler of  her right leg. She had this falling episode. She is having a little bit of discomfort with the right leg so I want make sure that we just follow up with this.  I will like to see her back in 3 months. I think of all looks good 3 months, then we can probably let her go from the clinic.    Josph MachoENNEVER,Johnwilliam Shepperson R, MD 08/16/2015 2:00 PM

## 2015-08-17 LAB — IRON AND TIBC
%SAT: 30 % (ref 21–57)
Iron: 76 ug/dL (ref 41–142)
TIBC: 251 ug/dL (ref 236–444)
UIBC: 175 ug/dL (ref 120–384)

## 2015-08-17 LAB — FERRITIN: FERRITIN: 242 ng/mL (ref 9–269)

## 2015-08-18 ENCOUNTER — Telehealth: Payer: Self-pay | Admitting: *Deleted

## 2015-08-18 NOTE — Telephone Encounter (Signed)
-----   Message from Josph MachoPeter R Ennever, MD sent at 08/16/2015  5:48 PM EDT ----- Call- NO blood clot in the right leg!! Please stop the coumadin and start 2 baby ASA a day with food!!  pete

## 2015-08-25 ENCOUNTER — Ambulatory Visit: Payer: BLUE CROSS/BLUE SHIELD

## 2015-09-20 ENCOUNTER — Other Ambulatory Visit: Payer: Self-pay | Admitting: *Deleted

## 2015-09-20 DIAGNOSIS — I82491 Acute embolism and thrombosis of other specified deep vein of right lower extremity: Secondary | ICD-10-CM

## 2015-09-20 DIAGNOSIS — I2699 Other pulmonary embolism without acute cor pulmonale: Secondary | ICD-10-CM

## 2015-09-20 MED ORDER — HYDROCHLOROTHIAZIDE 12.5 MG PO CAPS: 12.5000 mg | ORAL_CAPSULE | Freq: Every day | ORAL | 1 refills | Status: DC

## 2015-11-02 ENCOUNTER — Ambulatory Visit: Payer: Self-pay | Admitting: General Practice

## 2015-11-02 DIAGNOSIS — Z5181 Encounter for therapeutic drug level monitoring: Secondary | ICD-10-CM

## 2015-11-02 DIAGNOSIS — Z7901 Long term (current) use of anticoagulants: Secondary | ICD-10-CM

## 2015-11-15 ENCOUNTER — Other Ambulatory Visit (HOSPITAL_BASED_OUTPATIENT_CLINIC_OR_DEPARTMENT_OTHER): Payer: BLUE CROSS/BLUE SHIELD

## 2015-11-15 ENCOUNTER — Ambulatory Visit (HOSPITAL_BASED_OUTPATIENT_CLINIC_OR_DEPARTMENT_OTHER): Payer: BLUE CROSS/BLUE SHIELD | Admitting: Hematology & Oncology

## 2015-11-15 ENCOUNTER — Encounter: Payer: Self-pay | Admitting: Hematology & Oncology

## 2015-11-15 VITALS — BP 123/70 | HR 86 | Temp 98.0°F | Wt 287.1 lb

## 2015-11-15 DIAGNOSIS — I82491 Acute embolism and thrombosis of other specified deep vein of right lower extremity: Secondary | ICD-10-CM

## 2015-11-15 DIAGNOSIS — Z86718 Personal history of other venous thrombosis and embolism: Secondary | ICD-10-CM

## 2015-11-15 DIAGNOSIS — Z86711 Personal history of pulmonary embolism: Secondary | ICD-10-CM | POA: Diagnosis not present

## 2015-11-15 DIAGNOSIS — I2699 Other pulmonary embolism without acute cor pulmonale: Secondary | ICD-10-CM

## 2015-11-15 LAB — CMP (CANCER CENTER ONLY)
ALBUMIN: 3.4 g/dL (ref 3.3–5.5)
ALT(SGPT): 22 U/L (ref 10–47)
AST: 24 U/L (ref 11–38)
Alkaline Phosphatase: 57 U/L (ref 26–84)
BILIRUBIN TOTAL: 0.5 mg/dL (ref 0.20–1.60)
BUN: 9 mg/dL (ref 7–22)
CO2: 24 meq/L (ref 18–33)
CREATININE: 0.8 mg/dL (ref 0.6–1.2)
Calcium: 9.1 mg/dL (ref 8.0–10.3)
Chloride: 107 mEq/L (ref 98–108)
GLUCOSE: 119 mg/dL — AB (ref 73–118)
Potassium: 3.9 mEq/L (ref 3.3–4.7)
SODIUM: 139 meq/L (ref 128–145)
Total Protein: 7.2 g/dL (ref 6.4–8.1)

## 2015-11-15 LAB — CBC WITH DIFFERENTIAL (CANCER CENTER ONLY)
BASO#: 0 10*3/uL (ref 0.0–0.2)
BASO%: 0.2 % (ref 0.0–2.0)
EOS%: 8.8 % — AB (ref 0.0–7.0)
Eosinophils Absolute: 0.4 10*3/uL (ref 0.0–0.5)
HCT: 33.9 % — ABNORMAL LOW (ref 34.8–46.6)
HEMOGLOBIN: 11.3 g/dL — AB (ref 11.6–15.9)
LYMPH#: 2.1 10*3/uL (ref 0.9–3.3)
LYMPH%: 46.8 % (ref 14.0–48.0)
MCH: 28 pg (ref 26.0–34.0)
MCHC: 33.3 g/dL (ref 32.0–36.0)
MCV: 84 fL (ref 81–101)
MONO#: 0.3 10*3/uL (ref 0.1–0.9)
MONO%: 5.9 % (ref 0.0–13.0)
NEUT%: 38.3 % — ABNORMAL LOW (ref 39.6–80.0)
NEUTROS ABS: 1.7 10*3/uL (ref 1.5–6.5)
PLATELETS: 189 10*3/uL (ref 145–400)
RBC: 4.04 10*6/uL (ref 3.70–5.32)
RDW: 14.7 % (ref 11.1–15.7)
WBC: 4.6 10*3/uL (ref 3.9–10.0)

## 2015-11-15 NOTE — Progress Notes (Signed)
[facility]  Telephone:(336) [phone] Fax:(336) [phone]  ID: Hannah LightsWanda R Thompson OB: 06-17-1970 MR#: [id number] JYN#:[id number]#:[id number] Patient Care Team: Corwin LevinsJames W John, MD as PCP - General  DIAGNOSIS: Acute PE DVT in right lower extremity Thrombocytopenia - transient Leukopenia - EAL  INTERVAL HISTORY: Hannah Thompson is here today for a follow-up. She is doing pretty well.   She apparently fell and hurt her right leg. I do not know if this was from her left knee causing her problems. There was a little bit of swelling noted.   We did go ahead and get a Doppler of her right leg. This is back in August. There is no blood clot.  She really is not taking aspirin on a regular basis. She said that she just does not think about it.  She's had no change in bowel or bladder habits. She is looking forward to Thanksgiving. Things are a little bit slow at work right now for her.  She has had no bleeding. There's been no change in bowel or bladder habits. She's had no cough. There's been no chest wall pain.  She has had no fever. She's had no rashes.  Overall, her performance status is ECOG 1.     CURRENT TREATMENT: Coumadin - 2 years of therapy. Completed in September 2017 Aspirin 162 mg po q day - start after completing coumadin  REVIEW OF SYSTEMS: All other 10 point review of systems is negative.   PAST MEDICAL HISTORY: Past Medical History:  Diagnosis Date  . ALLERGIC RHINITIS 03/17/2009  . ANEMIA-NOS 03/17/2009  . ASTHMA 03/17/2009  . DISC DISEASE, LUMBAR 03/17/2009   mild facet arthropathy by MRI 2005  . ECZEMA 03/17/2009  . Headache(784.0) 06/21/2009   recurrent  . HYPERLIPIDEMIA 03/17/2009  . KELOID 06/21/2009  . VITAMIN B12 DEFICIENCY 03/18/2009    PAST SURGICAL HISTORY: Past Surgical History:  Procedure Laterality Date  . KELOID EXCISION  mid 1990's   s/p keloid removal-laser     FAMILY HISTORY Family History  Problem Relation Age of Onset  . Cancer Father     Prostate  Cancer  . Hypertension Father   . Allergies Sister   . Cancer Paternal Aunt     Ovarian Cancer  . Cancer Other     Grandparent-Lung Cancer  . Diabetes Other     Grandparent  . Heart disease Other     Grandparent  . Alcohol abuse Other     Several on both sides of family-3 uncles  . Hypertension Mother   . Diabetes Mother   . Deep vein thrombosis Sister     GYNECOLOGIC HISTORY:  No LMP recorded.   SOCIAL HISTORY:  Social History   Social History Narrative  . No narrative on file   ADVANCED DIRECTIVES: <no information>  HEALTH MAINTENANCE: Social History  Substance Use Topics  . Smoking status: Former Smoker    Packs/day: 0.25    Years: 4.00    Types: Cigarettes    Start date: 03/17/2004    Quit date: 04/17/2008  . Smokeless tobacco: Never Used     Comment: quit 5 years ago  . Alcohol use No     Comment: socially   Colonoscopy: PAP: Bone density: Lipid panel:  Allergies  Allergen Reactions  . Oxaprozin     angioedema  . Sulfonamide Derivatives     REACTION: hives  . Orange Juice [Orange Oil] Hives    Orange juice  . Tomato Hives    tomatoes  . Nsaids  Rash    Current Outpatient Prescriptions  Medication Sig Dispense Refill  . acetaminophen (TYLENOL) 325 MG tablet Take 2 tablets (650 mg total) by mouth every 6 (six) hours as needed for mild pain, moderate pain or headache (or Fever >/= 101).    . cetirizine (ZYRTEC) 10 MG tablet Take 1 tablet (10 mg total) by mouth daily. 30 tablet 11  . desonide (DESOWEN) 0.05 % ointment Apply 1 application topically 2 (two) times daily.     Marland Kitchen. EPINEPHrine (EPI-PEN) 0.3 mg/0.3 mL DEVI Inject 0.3 mLs (0.3 mg total) into the muscle daily as needed (Anaphylaxis). 2 Device 1  . hydrochlorothiazide (MICROZIDE) 12.5 MG capsule Take 1 capsule (12.5 mg total) by mouth daily. 90 capsule 1  . traMADol (ULTRAM) 50 MG tablet Take 1 tablet (50 mg total) by mouth every 6 (six) hours as needed. 60 tablet 1  . triamcinolone (NASACORT  AQ) 55 MCG/ACT AERO nasal inhaler Place 2 sprays into the nose daily. 1 Inhaler 12  . triamcinolone cream (KENALOG) 0.1 % Apply 1 application topically 2 (two) times daily.      No current facility-administered medications for this visit.     OBJECTIVE: Vitals:   11/15/15 1251  BP: 123/70  Pulse: 86  Temp: 98 F (36.7 C)   Body mass index is 44.97 kg/m.   Obese African American female in no obvious distress. Head and neck exam shows no ocular or oral lesions. There are no palpable cervical or supraclavicular lymph nodes. Lungs are clear. Cardiac exam regular rate and rhythm with no murmurs, rubs or bruits. Abdomen is soft. She is obese patients good bowel sounds. There is no fluid wave. There is no palpable liver or spleen tip. Back exam shows no tenderness over the spine, ribs or hips. Extremities shows no clubbing, cyanosis or edema. No venous cord is noted in her legs. She is a negative Homans sign in her legs bilaterally. She has a knee brace on her left knee. Skin exam shows no rashes, ecchymoses or petechia. Neurological exam shows no focal neurological deficits.  LAB RESULTS: CMP     Component Value Date/Time   NA 139 11/15/2015 1212   NA 140 08/16/2015 1314   K 3.9 11/15/2015 1212   K 3.9 08/16/2015 1314   CL 107 11/15/2015 1212   CO2 24 11/15/2015 1212   CO2 26 08/16/2015 1314   GLUCOSE 119 (H) 11/15/2015 1212   BUN 9 11/15/2015 1212   BUN 11.0 08/16/2015 1314   CREATININE 0.8 11/15/2015 1212   CREATININE 0.8 08/16/2015 1314   CALCIUM 9.1 11/15/2015 1212   CALCIUM 9.2 08/16/2015 1314   PROT 7.2 11/15/2015 1212   PROT 7.6 08/16/2015 1314   ALBUMIN 3.4 11/15/2015 1212   ALBUMIN 3.5 08/16/2015 1314   AST 24 11/15/2015 1212   AST 21 08/16/2015 1314   ALT 22 11/15/2015 1212   ALT 18 08/16/2015 1314   ALKPHOS 57 11/15/2015 1212   ALKPHOS 69 08/16/2015 1314   BILITOT 0.50 11/15/2015 1212   BILITOT <0.30 08/16/2015 1314   GFRNONAA >90 09/14/2013 1904   GFRAA >90  09/14/2013 1904   I No results found for: SPEP Lab Results  Component Value Date   WBC 4.6 11/15/2015   NEUTROABS 1.7 11/15/2015   HGB 11.3 (L) 11/15/2015   HCT 33.9 (L) 11/15/2015   MCV 84 11/15/2015   PLT 189 11/15/2015   No results found for: LABCA2 No components found for: [id number] No results for input(s): INR in  the last 168 hours.  STUDIES: None  ASSESSMENT/PLAN: Hannah Thompson is a very pleasant 45 year old African-American female. She has a normal hypercoagulable panel. However, there is a strong family history of thromboembolic disease.  I really told her that she needs to be on the baby aspirin. The 2 baby aspirin a day would really help her out. She admits that she often forgets to take these.   I also told her to take prenatal vitamins. I don't that she is not having a baby but yet prenatal vitamins have iron and folic acid and B vitamins that may help with her anemia. She probably has some degree of iron deficiency from having cycles.   From my point of view, I can probably let her go from the clinic. She knows that she always come back to see [place] if she has any problems. Hopefully, this will not be the case.   Josph Macho, MD 11/15/2015 2:43 PM

## 2015-11-16 LAB — D-DIMER, QUANTITATIVE (NOT AT ARMC): D-DIMER: 5.1 mg{FEU}/L — AB (ref 0.00–0.49)

## 2015-11-18 ENCOUNTER — Telehealth: Payer: Self-pay | Admitting: Hematology & Oncology

## 2015-11-18 NOTE — Telephone Encounter (Signed)
I left a message on Ms. Muzio's answer machine. I want to talk to her or preferably see her because her d-dimer is quite high. Because of this, I think that this indicates that she is at a significant risk for another thromboembolism. I do not want her to have one. She is on aspirin right now. I think we have to get her back on to Xarelto. That's why wanted to talk to her. I told her that she can either call us or come over to the office and I can work her in.  Again, I really want to make a change in her treatment and again I want to be proactive and tried to decrease her risk of a blood clot.  I gave her the office phone number to call. She can either call me or told her to come over to the office and I will work her in.  Christin BachPete Ennever, MD

## 2015-12-06 ENCOUNTER — Other Ambulatory Visit: Payer: Self-pay | Admitting: Oncology

## 2015-12-06 ENCOUNTER — Ambulatory Visit (HOSPITAL_BASED_OUTPATIENT_CLINIC_OR_DEPARTMENT_OTHER): Payer: BLUE CROSS/BLUE SHIELD | Admitting: Hematology & Oncology

## 2015-12-06 ENCOUNTER — Encounter (HOSPITAL_BASED_OUTPATIENT_CLINIC_OR_DEPARTMENT_OTHER): Payer: Self-pay

## 2015-12-06 ENCOUNTER — Ambulatory Visit (HOSPITAL_BASED_OUTPATIENT_CLINIC_OR_DEPARTMENT_OTHER)
Admission: RE | Admit: 2015-12-06 | Discharge: 2015-12-06 | Disposition: A | Discharge status: Home / Self Care | Payer: BLUE CROSS/BLUE SHIELD | Source: Ambulatory Visit | Attending: Hematology & Oncology | Admitting: Hematology & Oncology

## 2015-12-06 ENCOUNTER — Encounter: Payer: Self-pay | Admitting: Hematology & Oncology

## 2015-12-06 VITALS — BP 123/65 | HR 75 | Temp 97.8°F

## 2015-12-06 DIAGNOSIS — I2699 Other pulmonary embolism without acute cor pulmonale: Secondary | ICD-10-CM

## 2015-12-06 DIAGNOSIS — I517 Cardiomegaly: Secondary | ICD-10-CM | POA: Insufficient documentation

## 2015-12-06 DIAGNOSIS — Z86711 Personal history of pulmonary embolism: Secondary | ICD-10-CM | POA: Diagnosis not present

## 2015-12-06 DIAGNOSIS — R0602 Shortness of breath: Secondary | ICD-10-CM | POA: Insufficient documentation

## 2015-12-06 DIAGNOSIS — I824Z2 Acute embolism and thrombosis of unspecified deep veins of left distal lower extremity: Secondary | ICD-10-CM | POA: Insufficient documentation

## 2015-12-06 DIAGNOSIS — I82491 Acute embolism and thrombosis of other specified deep vein of right lower extremity: Secondary | ICD-10-CM

## 2015-12-06 DIAGNOSIS — Z86718 Personal history of other venous thrombosis and embolism: Secondary | ICD-10-CM

## 2015-12-06 MED ORDER — IOPAMIDOL (ISOVUE-370) INJECTION 76%
100.0000 mL | Freq: Once | INTRAVENOUS | Status: AC | PRN
  Administered 2015-12-06: 100 mL via INTRAVENOUS

## 2015-12-06 NOTE — Progress Notes (Signed)
[facility]  Telephone:(336) [phone] Fax:(336) [phone]  ID: Hannah Thompson: Nov 08, 1970 MR#: [id number] YSA#:[id number]#:[id number] Patient Care Team: Corwin LevinsJames W John, MD as PCP - General  DIAGNOSIS: Acute PE DVT in right lower extremity Thrombocytopenia - transient Leukopenia - EAL  INTERVAL HISTORY: Hannah Thompson is here today for an unscheduled visit. I last saw her back in November. When I saw her, we did a d-dimer which was 5.1. She had been on aspirin. She not been taking aspirin routinely.  I was quite worried about her have another thromboembolic event. I want her to come in so we could see back in her on anticoagulation.  Of note, she now is complaining of some shortness of breath. She said that is hard for her to go up a flight of stairs. She says she feels as if she felt back when she had a blood clot in her lung.  She also is complaining of some pain with the left knee. She did have a fall.   She is on baby aspirin. It is to say how often she is taking this.   She has had no fever. She's had no rashes.  Overall, her performance status is ECOG 1     VIEW OF SYSTEMS: All other 10 point review of systems is negative.   PAST MEDICAL HISTORY: Past Medical History:  Diagnosis Date  . ALLERGIC RHINITIS 03/17/2009  . ANEMIA-NOS 03/17/2009  . ASTHMA 03/17/2009  . DISC DISEASE, LUMBAR 03/17/2009   mild facet arthropathy by MRI 2005  . ECZEMA 03/17/2009  . Headache(784.0) 06/21/2009   recurrent  . HYPERLIPIDEMIA 03/17/2009  . KELOID 06/21/2009  . VITAMIN B12 DEFICIENCY 03/18/2009    PAST SURGICAL HISTORY: Past Surgical History:  Procedure Laterality Date  . KELOID EXCISION  mid 1990's   s/p keloid removal-laser     FAMILY HISTORY Family History  Problem Relation Age of Onset  . Cancer Father     Prostate Cancer  . Hypertension Father   . Allergies Sister   . Cancer Paternal Aunt     Ovarian Cancer  . Cancer Other     Grandparent-Lung Cancer  . Diabetes Other    Grandparent  . Heart disease Other     Grandparent  . Alcohol abuse Other     Several on both sides of family-3 uncles  . Hypertension Mother   . Diabetes Mother   . Deep vein thrombosis Sister     GYNECOLOGIC HISTORY:  Patient's last menstrual period was 11/22/2015.   SOCIAL HISTORY:  Social History   Social History Narrative  . No narrative on file   ADVANCED DIRECTIVES: <no information>  HEALTH MAINTENANCE: Social History  Substance Use Topics  . Smoking status: Former Smoker    Packs/day: 0.25    Years: 4.00    Types: Cigarettes    Start date: 03/17/2004    Quit date: 04/17/2008  . Smokeless tobacco: Never Used     Comment: quit 5 years ago  . Alcohol use No     Comment: socially   Colonoscopy: PAP: Bone density: Lipid panel:  Allergies  Allergen Reactions  . Oxaprozin     angioedema  . Sulfonamide Derivatives     REACTION: hives  . Orange Juice [Orange Oil] Hives    Orange juice  . Tomato Hives    tomatoes  . Nsaids Rash    Current Outpatient Prescriptions  Medication Sig Dispense Refill  . acetaminophen (TYLENOL) 325 MG tablet Take 2 tablets (650  mg total) by mouth every 6 (six) hours as needed for mild pain, moderate pain or headache (or Fever >/= 101).    . cetirizine (ZYRTEC) 10 MG tablet Take 1 tablet (10 mg total) by mouth daily. 30 tablet 11  . desonide (DESOWEN) 0.05 % ointment Apply 1 application topically 2 (two) times daily.     Marland Kitchen. EPINEPHrine (EPI-PEN) 0.3 mg/0.3 mL DEVI Inject 0.3 mLs (0.3 mg total) into the muscle daily as needed (Anaphylaxis). 2 Device 1  . hydrochlorothiazide (MICROZIDE) 12.5 MG capsule Take 1 capsule (12.5 mg total) by mouth daily. 90 capsule 1  . traMADol (ULTRAM) 50 MG tablet Take 1 tablet (50 mg total) by mouth every 6 (six) hours as needed. 60 tablet 1  . triamcinolone (NASACORT AQ) 55 MCG/ACT AERO nasal inhaler Place 2 sprays into the nose daily. 1 Inhaler 12  . triamcinolone cream (KENALOG) 0.1 % Apply 1  application topically 2 (two) times daily.      No current facility-administered medications for this visit.    Facility-Administered Medications Ordered in Other Visits  Medication Dose Route Frequency Provider Last Rate Last Dose  . iopamidol (ISOVUE-370) 76 % injection 100 mL  100 mL Intravenous Once PRN Josph MachoPeter R , MD        OBJECTIVE: Vitals:   12/06/15 0932  BP: 123/65  Pulse: 75  Temp: 97.8 F (36.6 C)   There is no height or weight on file to calculate BMI.   Obese African American female in no obvious distress. Head and neck exam shows no ocular or oral lesions. There are no palpable cervical or supraclavicular lymph nodes. Lungs are clear. Cardiac exam regular rate and rhythm with no murmurs, rubs or bruits. Abdomen is soft. She is obese patients good bowel sounds. There is no fluid wave. There is no palpable liver or spleen tip. Back exam shows no tenderness over the spine, ribs or hips. Extremities shows no clubbing, cyanosis or edema. No venous cord is noted in her legs. She is a negative Homans sign in her legs bilaterally. She has a knee brace on her left knee. Skin exam shows no rashes, ecchymoses or petechia. Neurological exam shows no focal neurological deficits.  LAB RESULTS: CMP     Component Value Date/Time   NA 139 11/15/2015 1212   NA 140 08/16/2015 1314   K 3.9 11/15/2015 1212   K 3.9 08/16/2015 1314   CL 107 11/15/2015 1212   CO2 24 11/15/2015 1212   CO2 26 08/16/2015 1314   GLUCOSE 119 (H) 11/15/2015 1212   BUN 9 11/15/2015 1212   BUN 11.0 08/16/2015 1314   CREATININE 0.8 11/15/2015 1212   CREATININE 0.8 08/16/2015 1314   CALCIUM 9.1 11/15/2015 1212   CALCIUM 9.2 08/16/2015 1314   PROT 7.2 11/15/2015 1212   PROT 7.6 08/16/2015 1314   ALBUMIN 3.4 11/15/2015 1212   ALBUMIN 3.5 08/16/2015 1314   AST 24 11/15/2015 1212   AST 21 08/16/2015 1314   ALT 22 11/15/2015 1212   ALT 18 08/16/2015 1314   ALKPHOS 57 11/15/2015 1212   ALKPHOS 69  08/16/2015 1314   BILITOT 0.50 11/15/2015 1212   BILITOT <0.30 08/16/2015 1314   GFRNONAA >90 09/14/2013 1904   GFRAA >90 09/14/2013 1904   I No results found for: SPEP Lab Results  Component Value Date   WBC 4.6 11/15/2015   NEUTROABS 1.7 11/15/2015   HGB 11.3 (L) 11/15/2015   HCT 33.9 (L) 11/15/2015   MCV 84 11/15/2015  PLT 189 11/15/2015   No results found for: LABCA2 No components found for: ZOXWR604 No results for input(s): INR in the last 168 hours.  STUDIES: None  ASSESSMENT/PLAN: Hannah Thompson is a very pleasant 45 year old African-American female. She has a normal hypercoagulable panel. However, there is a strong family history of thromboembolic disease.  I am really worried about her having another thromboembolic event. Her d-dimer was quite [place]ym[place] we saw her back in November.  I will go ahead and get a CT angiogram. She is having the new symptoms of shortness of breath.  I will also get a Doppler of her legs. She is complaining of some slight discomfort in the left leg.  I probably will have to get her back on Xarelto. Depending on what we find with our angiogram and Doppler, this will dictate the dose. If everything is negative, then we probably can get her on 10 mg daily as a maintenance dose.   We will plan to get her back probably within a month. Again a lot will depend on what we find a with our studies.   Josph Macho, MD 12/06/2015 10:25 AM

## 2015-12-07 ENCOUNTER — Telehealth: Payer: Self-pay | Admitting: *Deleted

## 2015-12-07 ENCOUNTER — Ambulatory Visit: Payer: BLUE CROSS/BLUE SHIELD | Admitting: Hematology & Oncology

## 2015-12-07 ENCOUNTER — Other Ambulatory Visit: Payer: Self-pay | Admitting: *Deleted

## 2015-12-07 MED ORDER — RIVAROXABAN 20 MG PO TABS: 20.0000 mg | ORAL_TABLET | Freq: Every day | ORAL | 11 refills | Status: DC

## 2015-12-07 NOTE — Telephone Encounter (Signed)
-----   Message from Josph MachoPeter R Ennever, MD sent at 12/07/2015  1:42 PM EST ----- I called her. I told her about the blood clot in the right leg. It is a small clot but yet it has to be treated. There is no blood clot in the lungs. We will go ahead and get her on Xarelto. I think 20 mg a day will be okay. I don't that we have to do any kind of loading dose with her. We will make sure that she stops taking the aspirin. I told her to call us if she has any problems. I probably will repeat the ultrasound of her leg in about 3 months to see how the blood clot responds. She understands all of this. She is very grateful that we are able to order these tests for her.  Cindee LamePete

## 2015-12-07 NOTE — Telephone Encounter (Signed)
Patient had some scans yesterday and was told by the on-call physician to call today to follow up.   Dr Myna HidalgoEnnever reviewed scans and patient is + for DVT. He wants her to start on Xarelto today. Prescription sent.  Patient educated to new prescription and requested that she call her pharmacy later this am to determine that it was filled to pick up. If there are any issues she knows to call us back.

## 2015-12-22 ENCOUNTER — Other Ambulatory Visit: Payer: Self-pay | Admitting: *Deleted

## 2015-12-22 DIAGNOSIS — I2699 Other pulmonary embolism without acute cor pulmonale: Secondary | ICD-10-CM

## 2015-12-23 ENCOUNTER — Ambulatory Visit (HOSPITAL_BASED_OUTPATIENT_CLINIC_OR_DEPARTMENT_OTHER): Payer: BLUE CROSS/BLUE SHIELD | Admitting: Hematology & Oncology

## 2015-12-23 ENCOUNTER — Encounter: Payer: Self-pay | Admitting: Hematology & Oncology

## 2015-12-23 ENCOUNTER — Other Ambulatory Visit (HOSPITAL_BASED_OUTPATIENT_CLINIC_OR_DEPARTMENT_OTHER): Payer: BLUE CROSS/BLUE SHIELD

## 2015-12-23 VITALS — BP 134/80 | HR 63 | Temp 98.1°F | Resp 16 | Ht 67.0 in | Wt 280.0 lb

## 2015-12-23 DIAGNOSIS — Z7901 Long term (current) use of anticoagulants: Secondary | ICD-10-CM | POA: Diagnosis not present

## 2015-12-23 DIAGNOSIS — I824Z2 Acute embolism and thrombosis of unspecified deep veins of left distal lower extremity: Secondary | ICD-10-CM | POA: Diagnosis not present

## 2015-12-23 DIAGNOSIS — I2699 Other pulmonary embolism without acute cor pulmonale: Secondary | ICD-10-CM

## 2015-12-23 DIAGNOSIS — Z86711 Personal history of pulmonary embolism: Secondary | ICD-10-CM | POA: Diagnosis not present

## 2015-12-23 LAB — COMPREHENSIVE METABOLIC PANEL
ALBUMIN: 3.7 g/dL (ref 3.5–5.0)
ALK PHOS: 73 U/L (ref 40–150)
ALT: 12 U/L (ref 0–55)
ANION GAP: 9 meq/L (ref 3–11)
AST: 15 U/L (ref 5–34)
BILIRUBIN TOTAL: 0.37 mg/dL (ref 0.20–1.20)
BUN: 11.5 mg/dL (ref 7.0–26.0)
CALCIUM: 9.4 mg/dL (ref 8.4–10.4)
CO2: 25 mEq/L (ref 22–29)
Chloride: 106 mEq/L (ref 98–109)
Creatinine: 0.8 mg/dL (ref 0.6–1.1)
GLUCOSE: 104 mg/dL (ref 70–140)
Potassium: 3.8 mEq/L (ref 3.5–5.1)
SODIUM: 140 meq/L (ref 136–145)
TOTAL PROTEIN: 7.7 g/dL (ref 6.4–8.3)

## 2015-12-23 LAB — CBC WITH DIFFERENTIAL (CANCER CENTER ONLY)
BASO#: 0 10*3/uL (ref 0.0–0.2)
BASO%: 0.2 % (ref 0.0–2.0)
EOS ABS: 0.4 10*3/uL (ref 0.0–0.5)
EOS%: 8.1 % — ABNORMAL HIGH (ref 0.0–7.0)
HEMATOCRIT: 35.2 % (ref 34.8–46.6)
HEMOGLOBIN: 11.9 g/dL (ref 11.6–15.9)
LYMPH#: 1.9 10*3/uL (ref 0.9–3.3)
LYMPH%: 44.4 % (ref 14.0–48.0)
MCH: 28.1 pg (ref 26.0–34.0)
MCHC: 33.8 g/dL (ref 32.0–36.0)
MCV: 83 fL (ref 81–101)
MONO#: 0.3 10*3/uL (ref 0.1–0.9)
MONO%: 7.7 % (ref 0.0–13.0)
NEUT%: 39.6 % (ref 39.6–80.0)
NEUTROS ABS: 1.7 10*3/uL (ref 1.5–6.5)
Platelets: 200 10*3/uL (ref 145–400)
RBC: 4.24 10*6/uL (ref 3.70–5.32)
RDW: 14.4 % (ref 11.1–15.7)
WBC: 4.3 10*3/uL (ref 3.9–10.0)

## 2015-12-23 NOTE — Progress Notes (Signed)
Hematology and Oncology Follow Up Visit  Hannah Thompson [id number] 1970-12-23 45 y.o. 12/23/2015   Principle Diagnosis:   Acute thrombus in the left gastrocnemius vein  History of pulmonary embolism-resolved  Ethnic associated leukopenia  Transient thrombocytopenia  Current Therapy:    Xarelto 20 mg by mouth daily - to complete 6 months in June 2018     Interim History:  [name] is back for follow-up. She now has a new thrombus. We saw her back in early December. She was complaining about some shortness of breath. She had noticed some slight pain in the left knee.  We went ahead and got a CT angiogram and Doppler of her legs. Thankfully, the CT angiogram did not show any evidence of pulmonary embolism. Unfortunately, she did have an acute thrombus in the left gastrocnemius vein.  I have her on Xarelto now. She is on 20 mg a day.  She's had no bleeding. She's had no leg swelling. She is still staying active.  She's had no cough or shortness of breath. She's had no nausea or vomiting. She's had no headache.  I told her she does have headaches, that she can always take Tylenol.  Overall, her performance status is ECOG 0.  Medications:  Current Outpatient Prescriptions:  .  acetaminophen (TYLENOL) 325 MG tablet, Take 2 tablets (650 mg total) by mouth every 6 (six) hours as needed for mild pain, moderate pain or headache (or Fever >/= 101)., Disp: , Rfl:  .  cetirizine (ZYRTEC) 10 MG tablet, Take 1 tablet (10 mg total) by mouth daily., Disp: 30 tablet, Rfl: 11 .  desonide (DESOWEN) 0.05 % ointment, Apply 1 application topically 2 (two) times daily. , Disp: , Rfl:  .  EPINEPHrine (EPI-PEN) 0.3 mg/0.3 mL DEVI, Inject 0.3 mLs (0.3 mg total) into the muscle daily as needed (Anaphylaxis)., Disp: 2 Device, Rfl: 1 .  hydrochlorothiazide (MICROZIDE) 12.5 MG capsule, Take 1 capsule (12.5 mg total) by mouth daily., Disp: 90 capsule, Rfl: 1 .  rivaroxaban (XARELTO) 20 MG TABS tablet,  Take 1 tablet (20 mg total) by mouth daily with supper., Disp: 30 tablet, Rfl: 11 .  traMADol (ULTRAM) 50 MG tablet, Take 1 tablet (50 mg total) by mouth every 6 (six) hours as needed., Disp: 60 tablet, Rfl: 1 .  triamcinolone (NASACORT AQ) 55 MCG/ACT AERO nasal inhaler, Place 2 sprays into the nose daily., Disp: 1 Inhaler, Rfl: 12 .  triamcinolone cream (KENALOG) 0.1 %, Apply 1 application topically 2 (two) times daily. , Disp: , Rfl:   Allergies:  Allergies  Allergen Reactions  . Oxaprozin     angioedema  . Sulfonamide Derivatives     REACTION: hives  . Orange Juice [Orange Oil] Hives    Orange juice  . Tomato Hives    tomatoes  . Nsaids Rash    Past Medical History, Surgical history, Social history, and Family History were reviewed and updated.  Review of Systems:  As above  Physical Exam:  height is 5\' 7"  (1.702 m) and weight is 280 lb (127 kg). Her oral temperature is 98.1 F (36.7 C). Her blood pressure is 134/80 and her pulse is 63. Her respiration is 16.   Wt Readings from Last 3 Encounters:  12/23/15 280 lb (127 kg)  11/15/15 287 lb 1.9 oz (130.2 kg)  08/16/15 282 lb (127.9 kg)      Obese African-American female. Head and neck exam shows no ocular or oral lesions. There are no palpable cervical or  supraclavicular lymph nodes. Lungs are clear bilaterally. Cardiac exam regular rate and rhythm with no murmurs, rubs or bruits. Abdomen is soft. She is obese. She has good bowel sounds. There is no fluid wave. There is no palpable liver or spleen tip. Back exam shows no tenderness over the spine, ribs or hips. Extremities shows no clubbing, cyanosis or edema. She has no palpable venous cord in the legs. She has good range of motion of her joints. There is no positive Homans sign. Skin exam shows no rashes, ecchymoses or petechia. Neurological exam shows no focal neurological deficits.  Lab Results  Component Value Date   WBC 4.3 12/23/2015   HGB 11.9 12/23/2015   HCT 35.2  12/23/2015   MCV 83 12/23/2015   PLT 200 12/23/2015     Chemistry      Component Value Date/Time   NA 140 12/23/2015 1146   K 3.8 12/23/2015 1146   CL 107 11/15/2015 1212   CO2 25 12/23/2015 1146   BUN 11.5 12/23/2015 1146   CREATININE 0.8 12/23/2015 1146      Component Value Date/Time   CALCIUM 9.4 12/23/2015 1146   ALKPHOS 73 12/23/2015 1146   AST 15 12/23/2015 1146   ALT 12 12/23/2015 1146   BILITOT 0.37 12/23/2015 1146         Impression and Plan: [name] is A 45 year old African American female. She has another thrombus. This is in the left gastrocnemius vein.  I think she probably is going to need lifelong anticoagulation. She has idiopathic thrombi.  We'll keep her on 20 mg of Xarelto for the next 6 months. After that, then we will probably get her on lifelong low-dose Xarelto at 10 mg daily.  I will get her back in 3 months. I want to do a Doppler of her left leg when we see her back. Hope, the thrombus will have resolved.  I spent about 25 minutes with her today. I went over the CT angiogram report and the Doppler of her legs.  She understands very well that she will need long-term anticoagulation. I told her that she cannot take any type of Motrin or Aleve. If she has no other action for taken these, then she must take them with food on a full stomach. She understands this.   Josph MachoENNEVER, R, MD 12/21/20171:45 PM

## 2015-12-24 LAB — D-DIMER, QUANTITATIVE: D-DIMER: 0.52 mg/L FEU — ABNORMAL HIGH (ref 0.00–0.49)

## 2016-02-21 ENCOUNTER — Ambulatory Visit: Payer: BLUE CROSS/BLUE SHIELD | Admitting: Family Medicine

## 2016-03-07 ENCOUNTER — Telehealth: Payer: Self-pay

## 2016-03-07 NOTE — Telephone Encounter (Signed)
Received call from pt questioning if [place] hx of DVT and PE.   Per Maralyn SagoSarah, NP pt is scheduled to have Xarelto dose reduced in June so we will continue her on 20mg  until after her flight. Pt also instructed to use compression stocking, hydrate & walk around plane when able.   Pt repeats back instructions and verbalizes understanding. dph

## 2016-03-23 ENCOUNTER — Ambulatory Visit (HOSPITAL_BASED_OUTPATIENT_CLINIC_OR_DEPARTMENT_OTHER): Payer: BLUE CROSS/BLUE SHIELD | Admitting: Hematology & Oncology

## 2016-03-23 ENCOUNTER — Ambulatory Visit (HOSPITAL_BASED_OUTPATIENT_CLINIC_OR_DEPARTMENT_OTHER)
Admission: RE | Admit: 2016-03-23 | Discharge: 2016-03-23 | Disposition: A | Discharge status: Home / Self Care | Payer: BLUE CROSS/BLUE SHIELD | Source: Ambulatory Visit | Attending: Hematology & Oncology | Admitting: Hematology & Oncology

## 2016-03-23 ENCOUNTER — Other Ambulatory Visit (HOSPITAL_BASED_OUTPATIENT_CLINIC_OR_DEPARTMENT_OTHER): Payer: BLUE CROSS/BLUE SHIELD

## 2016-03-23 ENCOUNTER — Encounter: Payer: Self-pay | Admitting: Hematology & Oncology

## 2016-03-23 VITALS — BP 116/58 | HR 80 | Temp 97.7°F | Resp 18 | Wt 283.1 lb

## 2016-03-23 DIAGNOSIS — I82891 Chronic embolism and thrombosis of other specified veins: Secondary | ICD-10-CM

## 2016-03-23 DIAGNOSIS — I824Z2 Acute embolism and thrombosis of unspecified deep veins of left distal lower extremity: Secondary | ICD-10-CM

## 2016-03-23 DIAGNOSIS — Z86711 Personal history of pulmonary embolism: Secondary | ICD-10-CM

## 2016-03-23 DIAGNOSIS — I82491 Acute embolism and thrombosis of other specified deep vein of right lower extremity: Secondary | ICD-10-CM

## 2016-03-23 LAB — CBC WITH DIFFERENTIAL (CANCER CENTER ONLY)
BASO#: 0 10*3/uL (ref 0.0–0.2)
BASO%: 0.4 % (ref 0.0–2.0)
EOS ABS: 0.3 10*3/uL (ref 0.0–0.5)
EOS%: 6.5 % (ref 0.0–7.0)
HEMATOCRIT: 33.5 % — AB (ref 34.8–46.6)
HEMOGLOBIN: 11.3 g/dL — AB (ref 11.6–15.9)
LYMPH#: 2 10*3/uL (ref 0.9–3.3)
LYMPH%: 38.9 % (ref 14.0–48.0)
MCH: 28.5 pg (ref 26.0–34.0)
MCHC: 33.7 g/dL (ref 32.0–36.0)
MCV: 84 fL (ref 81–101)
MONO#: 0.5 10*3/uL (ref 0.1–0.9)
MONO%: 9.8 % (ref 0.0–13.0)
NEUT#: 2.3 10*3/uL (ref 1.5–6.5)
NEUT%: 44.4 % (ref 39.6–80.0)
Platelets: 186 10*3/uL (ref 145–400)
RBC: 3.97 10*6/uL (ref 3.70–5.32)
RDW: 14.2 % (ref 11.1–15.7)
WBC: 5.1 10*3/uL (ref 3.9–10.0)

## 2016-03-23 LAB — COMPREHENSIVE METABOLIC PANEL
ALT: 19 U/L (ref 0–55)
ANION GAP: 7 meq/L (ref 3–11)
AST: 19 U/L (ref 5–34)
Albumin: 3.5 g/dL (ref 3.5–5.0)
Alkaline Phosphatase: 73 U/L (ref 40–150)
BILIRUBIN TOTAL: 0.24 mg/dL (ref 0.20–1.20)
BUN: 11.8 mg/dL (ref 7.0–26.0)
CALCIUM: 9.5 mg/dL (ref 8.4–10.4)
CO2: 25 mEq/L (ref 22–29)
CREATININE: 0.8 mg/dL (ref 0.6–1.1)
Chloride: 103 mEq/L (ref 98–109)
EGFR: >90 mL/min/{1.73_m2} (ref >90)
Glucose: 92 mg/dl (ref 70–140)
Potassium: 4.1 mEq/L (ref 3.5–5.1)
Sodium: 135 mEq/L — ABNORMAL LOW (ref 136–145)
TOTAL PROTEIN: 7.5 g/dL (ref 6.4–8.3)

## 2016-03-23 NOTE — Progress Notes (Signed)
Hematology and Oncology Follow Up Visit  Hannah Thompson [id number] 1970-12-09 46 y.o. 03/23/2016   Principle Diagnosis:   Acute thrombus in the left gastrocnemius vein  History of pulmonary embolism-resolved  Ethnic associated leukopenia  Transient thrombocytopenia  Current Therapy:   Xarelto 10 mg by mouth daily - to complete 1 year in April 2019     Interim History:  Hannah Thompson is back for follow-up. She is doing well. She feels well. There is no swelling in the left leg.  We did go ahead and do a Doppler of her left leg. This did show a chronic thrombus in the left gastrocnemius vein. There has been no propagation.  She has had no bleeding. She's had no cough or shortness of breath. There's been no chest wall pain.  She's had no change in bowel or bladder habits.  She's had no fever. She's had no rashes.  Overall, her performance status is ECOG 0.  Medications:  Current Outpatient Prescriptions:  .  acetaminophen (TYLENOL) 325 MG tablet, Take 2 tablets (650 mg total) by mouth every 6 (six) hours as needed for mild pain, moderate pain or headache (or Fever >/= 101)., Disp: , Rfl:  .  cetirizine (ZYRTEC) 10 MG tablet, Take 1 tablet (10 mg total) by mouth daily., Disp: 30 tablet, Rfl: 11 .  desonide (DESOWEN) 0.05 % ointment, Apply 1 application topically 2 (two) times daily. , Disp: , Rfl:  .  EPINEPHrine (EPI-PEN) 0.3 mg/0.3 mL DEVI, Inject 0.3 mLs (0.3 mg total) into the muscle daily as needed (Anaphylaxis)., Disp: 2 Device, Rfl: 1 .  hydrochlorothiazide (MICROZIDE) 12.5 MG capsule, Take 1 capsule (12.5 mg total) by mouth daily., Disp: 90 capsule, Rfl: 1 .  rivaroxaban (XARELTO) 20 MG TABS tablet, Take 1 tablet (20 mg total) by mouth daily with supper., Disp: 30 tablet, Rfl: 11 .  traMADol (ULTRAM) 50 MG tablet, Take 1 tablet (50 mg total) by mouth every 6 (six) hours as needed., Disp: 60 tablet, Rfl: 1 .  triamcinolone (NASACORT AQ) 55 MCG/ACT AERO nasal inhaler, Place 2  sprays into the nose daily., Disp: 1 Inhaler, Rfl: 12 .  triamcinolone cream (KENALOG) 0.1 %, Apply 1 application topically 2 (two) times daily. , Disp: , Rfl:   Allergies:  Allergies  Allergen Reactions  . Oxaprozin     angioedema  . Sulfonamide Derivatives     REACTION: hives  . Orange Juice [Orange Oil] Hives    Orange juice  . Tomato Hives    tomatoes  . Nsaids Rash    Past Medical History, Surgical history, Social history, and Family History were reviewed and updated.  Review of Systems:  As above  Physical Exam:  weight is 283 lb 1.9 oz (128.4 kg). Her oral temperature is 97.7 F (36.5 C). Her blood pressure is 116/58 (abnormal) and her pulse is 80. Her respiration is 18 and oxygen saturation is 99%.   Wt Readings from Last 3 Encounters:  03/23/16 283 lb 1.9 oz (128.4 kg)  12/23/15 280 lb (127 kg)  11/15/15 287 lb 1.9 oz (130.2 kg)      Obese African-American female. Head and neck exam shows no ocular or oral lesions. There are no palpable cervical or supraclavicular lymph nodes. Lungs are clear bilaterally. Cardiac exam regular rate and rhythm with no murmurs, rubs or bruits. Abdomen is soft. She is obese. She has good bowel sounds. There is no fluid wave. There is no palpable liver or spleen tip. Back exam shows  no tenderness over the spine, ribs or hips. Extremities shows no clubbing, cyanosis or edema. She has no palpable venous cord in the legs. She has good range of motion of her joints. There is no positive Homans sign in the left leg.. Skin exam shows no rashes, ecchymoses or petechia. Neurological exam shows no focal neurological deficits.  Lab Results  Component Value Date   WBC 5.1 03/23/2016   HGB 11.3 (L) 03/23/2016   HCT 33.5 (L) 03/23/2016   MCV 84 03/23/2016   PLT 186 03/23/2016     Chemistry      Component Value Date/Time   NA 140 12/23/2015 1146   K 3.8 12/23/2015 1146   CL 107 11/15/2015 1212   CO2 25 12/23/2015 1146   BUN 11.5 12/23/2015  1146   CREATININE 0.8 12/23/2015 1146      Component Value Date/Time   CALCIUM 9.4 12/23/2015 1146   ALKPHOS 73 12/23/2015 1146   AST 15 12/23/2015 1146   ALT 12 12/23/2015 1146   BILITOT 0.37 12/23/2015 1146         Impression and Plan: Hannah Thompson is a 46 year old African American female. She has another thrombus. This is in the left gastrocnemius vein. This is chronic.  I think we can probably consider her for low-dose Xarelto. I think this would be reasonable.  It is hard to say whether or not she needs lifelong anticoagulation.  I want to see her back in 6 months. I think in 6 months I will repeat the Doppler. I'm sure that this thrombus is chronic. However if it is in the gastrocnemius vein, there should be a very low likelihood of propagation.   Wallene Dales, MD 3/22/20181:27 PM

## 2016-03-24 LAB — D-DIMER, QUANTITATIVE (NOT AT ARMC): D-DIMER: 0.4 mg{FEU}/L (ref 0.00–0.49)

## 2016-03-27 ENCOUNTER — Ambulatory Visit (INDEPENDENT_AMBULATORY_CARE_PROVIDER_SITE_OTHER): Payer: BLUE CROSS/BLUE SHIELD | Admitting: Family

## 2016-03-27 ENCOUNTER — Encounter: Payer: Self-pay | Admitting: Family

## 2016-03-27 VITALS — BP 108/64 | HR 78 | Temp 98.5°F | Resp 16 | Ht 67.0 in | Wt 285.0 lb

## 2016-03-27 DIAGNOSIS — S90851A Superficial foreign body, right foot, initial encounter: Secondary | ICD-10-CM | POA: Insufficient documentation

## 2016-03-27 NOTE — Assessment & Plan Note (Signed)
Foreign body with small piece of glass noted in right foot and removed through wound debridement with no complications and good tolerance by patient. Recommend basic wound care. Follow up for signs of infection or worsening symptoms.

## 2016-03-27 NOTE — Patient Instructions (Signed)
Thank you for choosing [facility].  SUMMARY AND INSTRUCTIONS:  Keep clean with soap and water.   No peroxide or alcohol.  Wound appeared clear and was flushed.  Follow up for symptoms worsening or signs of infection.   Follow up:  If your symptoms worsen or fail to improve, please contact our office for further instruction, or in case of emergency go directly to the emergency room at the closest medical facility.

## 2016-03-27 NOTE — Progress Notes (Signed)
Subjective:    Patient ID: Hannah Thompson, female    DOB: [birth year], 46 y.o.   MRN: [id number]  Chief Complaint  Patient presents with  . Foot Pain    stepped on glass with right foot, still thinks there is glass in it    HPI:  Hannah Thompson is a 46 y.o. female who  has a past medical history of ALLERGIC RHINITIS (03/17/2009); ANEMIA-NOS (03/17/2009); ASTHMA (03/17/2009); DISC DISEASE, LUMBAR (03/17/2009); ECZEMA (03/17/2009); JYNWGNFA(213.0Headache(784.0) (06/21/2009); HYPERLIPIDEMIA (03/17/2009); KELOID (06/21/2009); and VITAMIN B12 DEFICIENCY (03/18/2009). and presents today for an office visit.  This is a new problem. Associated symptom of a wound located on her right foot has been going on for about 3 days following stepping on a piece of glass. Modifying factors include self-exploration of the wound. She has also soaked in peroxide. No bleeding but does continue to experience pain when she steps.  Allergies  Allergen Reactions  . Oxaprozin     angioedema  . Sulfonamide Derivatives     REACTION: hives  . Orange Juice [Orange Oil] Hives    Orange juice  . Tomato Hives    tomatoes  . Nsaids Rash      Outpatient Medications Prior to Visit  Medication Sig Dispense Refill  . acetaminophen (TYLENOL) 325 MG tablet Take 2 tablets (650 mg total) by mouth every 6 (six) hours as needed for mild pain, moderate pain or headache (or Fever >/= 101).    . cetirizine (ZYRTEC) 10 MG tablet Take 1 tablet (10 mg total) by mouth daily. 30 tablet 11  . desonide (DESOWEN) 0.05 % ointment Apply 1 application topically 2 (two) times daily.     Marland Kitchen. EPINEPHrine (EPI-PEN) 0.3 mg/0.3 mL DEVI Inject 0.3 mLs (0.3 mg total) into the muscle daily as needed (Anaphylaxis). 2 Device 1  . hydrochlorothiazide (MICROZIDE) 12.5 MG capsule Take 1 capsule (12.5 mg total) by mouth daily. 90 capsule 1  . rivaroxaban (XARELTO) 20 MG TABS tablet Take 1 tablet (20 mg total) by mouth daily with supper. 30 tablet 11  . traMADol (ULTRAM) 50  MG tablet Take 1 tablet (50 mg total) by mouth every 6 (six) hours as needed. 60 tablet 1  . triamcinolone (NASACORT AQ) 55 MCG/ACT AERO nasal inhaler Place 2 sprays into the nose daily. 1 Inhaler 12  . triamcinolone cream (KENALOG) 0.1 % Apply 1 application topically 2 (two) times daily.      No facility-administered medications prior to visit.       Past Surgical History:  Procedure Laterality Date  . KELOID EXCISION  mid 1990's   s/p keloid removal-laser       Past Medical History:  Diagnosis Date  . ALLERGIC RHINITIS 03/17/2009  . ANEMIA-NOS 03/17/2009  . ASTHMA 03/17/2009  . DISC DISEASE, LUMBAR 03/17/2009   mild facet arthropathy by MRI 2005  . ECZEMA 03/17/2009  . Headache(784.0) 06/21/2009   recurrent  . HYPERLIPIDEMIA 03/17/2009  . KELOID 06/21/2009  . VITAMIN B12 DEFICIENCY 03/18/2009      Review of Systems  Constitutional: Negative for chills and fever.  Skin: Positive for wound.  Neurological: Negative for weakness and numbness.      Objective:    BP 108/64 (BP Location: Left Arm, Patient Position: Sitting, Cuff Size: Large)   Pulse 78   Temp 98.5 F (36.9 C) (Oral)   Resp 16   Ht 5\' 7"  (1.702 m)   Wt 285 lb (129.3 kg)   SpO2 97%   BMI 44.64  kg/m  Nursing note and vital signs reviewed.  Physical Exam  Constitutional: She is oriented to person, place, and time. She appears well-developed and well-nourished. No distress.  Cardiovascular: Normal rate, regular rhythm, normal heart sounds and intact distal pulses.   Pulmonary/Chest: Effort normal and breath sounds normal.  Neurological: She is alert and oriented to person, place, and time.  Skin: Skin is warm and dry.  Approximately 3 mm linear wound located on the plantar aspect of the heel. Apparent reflection of small piece of glass noted when viewed with otoscope light and magnifier.   Psychiatric: She has a normal mood and affect. Her behavior is normal. Judgment and thought content normal.     Procedure: Foreign Body Removal From Right foot wound  Risks and alternative were discussed with patient's verbal consent to continue. The site was identified and marked. It was cleansed with betadine and allowed to drive. Topical cold spray was applied to numb the area which was anthesized with 1% lidocaine. The wound was explored and debrided using splinter tweezers and forceps. The wound was flushed with normal saline and bandaged. Procedure was uncomplicated and tolerated well. Post care instructions were provided.     Assessment & Plan:   Problem List Items Addressed This Visit      Other   Foreign body in right foot - Primary    Foreign body with small piece of glass noted in right foot and removed through wound debridement with no complications and good tolerance by patient. Recommend basic wound care. Follow up for signs of infection or worsening symptoms.           I am having Hannah Thompson maintain her acetaminophen, desonide, triamcinolone cream, cetirizine, triamcinolone, traMADol, EPINEPHrine, hydrochlorothiazide, and rivaroxaban.   Follow-up: Return if symptoms worsen or fail to improve.  Jeanine Luz, FNP

## 2016-06-09 ENCOUNTER — Telehealth: Payer: Self-pay | Admitting: *Deleted

## 2016-06-09 NOTE — Telephone Encounter (Signed)
PAtient called stating that she hit her leg on metal container and has a big knot on her leg.  Patient is on Xarelto 10 mg daily.  Spoke with Dr. Myna HidalgoEnnever who recommends patient go to the ED to have it looked at because it is so late in the day.  PAtient does not seem to think she will do this but will call us to see how her leg is on Monday.  Encouraged patient to go to the ED if continues to get worse

## 2016-06-13 ENCOUNTER — Other Ambulatory Visit (HOSPITAL_BASED_OUTPATIENT_CLINIC_OR_DEPARTMENT_OTHER): Payer: BLUE CROSS/BLUE SHIELD

## 2016-06-13 ENCOUNTER — Ambulatory Visit (HOSPITAL_BASED_OUTPATIENT_CLINIC_OR_DEPARTMENT_OTHER): Payer: BLUE CROSS/BLUE SHIELD | Admitting: Family

## 2016-06-13 VITALS — BP 112/80 | HR 64 | Temp 98.0°F | Resp 18 | Wt 286.0 lb

## 2016-06-13 DIAGNOSIS — Z86711 Personal history of pulmonary embolism: Secondary | ICD-10-CM

## 2016-06-13 DIAGNOSIS — Z86718 Personal history of other venous thrombosis and embolism: Secondary | ICD-10-CM | POA: Diagnosis not present

## 2016-06-13 DIAGNOSIS — Z7901 Long term (current) use of anticoagulants: Secondary | ICD-10-CM

## 2016-06-13 DIAGNOSIS — I82491 Acute embolism and thrombosis of other specified deep vein of right lower extremity: Secondary | ICD-10-CM

## 2016-06-13 LAB — CBC WITH DIFFERENTIAL (CANCER CENTER ONLY)
BASO#: 0 10*3/uL (ref 0.0–0.2)
BASO%: 0.2 % (ref 0.0–2.0)
EOS%: 6.4 % (ref 0.0–7.0)
Eosinophils Absolute: 0.3 10*3/uL (ref 0.0–0.5)
HCT: 33.9 % — ABNORMAL LOW (ref 34.8–46.6)
HGB: 11.4 g/dL — ABNORMAL LOW (ref 11.6–15.9)
LYMPH#: 2.1 10*3/uL (ref 0.9–3.3)
LYMPH%: 42.5 % (ref 14.0–48.0)
MCH: 28.2 pg (ref 26.0–34.0)
MCHC: 33.6 g/dL (ref 32.0–36.0)
MCV: 84 fL (ref 81–101)
MONO#: 0.3 10*3/uL (ref 0.1–0.9)
MONO%: 6.4 % (ref 0.0–13.0)
NEUT#: 2.2 10*3/uL (ref 1.5–6.5)
NEUT%: 44.5 % (ref 39.6–80.0)
PLATELETS: 193 10*3/uL (ref 145–400)
RBC: 4.04 10*6/uL (ref 3.70–5.32)
RDW: 14.1 % (ref 11.1–15.7)
WBC: 5 10*3/uL (ref 3.9–10.0)

## 2016-06-13 NOTE — Progress Notes (Signed)
Hematology and Oncology Follow Up Visit  Hannah Thompson [id number] [birth year] 46 y.o. 06/13/2016   Principle Diagnosis:  Acute thrombus in the left gastrocnemius vein History of pulmonary embolism-resolved Ethnic associated leukopenia Transient thrombocytopenia  Current Therapy:   Xarelto 20 mg by mouth daily - to complete 1 year in April 2019         Interim History:  Hannah Thompson is here today with c/o of a bump on her right shin after falling at work last week. She has a little bruise and goose egg the size of a small marble on her right shin. This is not painful. There is no swelling or redness.  She verbalized that she is taking her Xarelto 20 mg daily.  No episodes of bleeding or petechiae. No lymphadenopathy.  No fever, chills, n/v, cough, rash, dizziness, SOB, chest pain, palpitations, abdominal pain or change sin bowel or bladder habits.  No swelling, tenderness, numbness or tingling in her extremities.  She has maintained a good appetite and is staying well hydrated. Her weight is stable.   ECOG Performance Status: 1 - Symptomatic but completely ambulatory  Medications:  Allergies as of 06/13/2016      Reactions   Oxaprozin    angioedema   Sulfonamide Derivatives    REACTION: hives   Orange Juice [orange Oil] Hives   Orange juice   Tomato Hives   tomatoes   Nsaids Rash      Medication List       Accurate as of 06/13/16  1:44 PM. Always use your most recent med list.          acetaminophen 325 MG tablet Commonly known as:  TYLENOL Take 2 tablets (650 mg total) by mouth every 6 (six) hours as needed for mild pain, moderate pain or headache (or Fever >/= 101).   cetirizine 10 MG tablet Commonly known as:  ZYRTEC Take 1 tablet (10 mg total) by mouth daily.   desonide 0.05 % ointment Commonly known as:  DESOWEN Apply 1 application topically 2 (two) times daily.   EPINEPHrine 0.3 mg/0.3 mL Devi Commonly known as:  EPI-PEN Inject 0.3 mLs (0.3 mg total) into  the muscle daily as needed (Anaphylaxis).   hydrochlorothiazide 12.5 MG capsule Commonly known as:  MICROZIDE Take 1 capsule (12.5 mg total) by mouth daily.   rivaroxaban 20 MG Tabs tablet Commonly known as:  XARELTO Take 1 tablet (20 mg total) by mouth daily with supper.   traMADol 50 MG tablet Commonly known as:  ULTRAM Take 1 tablet (50 mg total) by mouth every 6 (six) hours as needed.   triamcinolone 55 MCG/ACT Aero nasal inhaler Commonly known as:  NASACORT AQ Place 2 sprays into the nose daily.   triamcinolone cream 0.1 % Commonly known as:  KENALOG Apply 1 application topically 2 (two) times daily.       Allergies:  Allergies  Allergen Reactions  . Oxaprozin     angioedema  . Sulfonamide Derivatives     REACTION: hives  . Orange Juice [Orange Oil] Hives    Orange juice  . Tomato Hives    tomatoes  . Nsaids Rash    Past Medical History, Surgical history, Social history, and Family History were reviewed and updated.  Review of Systems: All other 10 point review of systems is negative.   Physical Exam:  vitals were not taken for this visit.  Wt Readings from Last 3 Encounters:  03/27/16 285 lb (129.3 kg)  03/23/16 283 lb  1.9 oz (128.4 kg)  12/23/15 280 lb (127 kg)    Ocular: Sclerae unicteric, pupils equal, round and reactive to light Ear-nose-throat: Oropharynx clear, dentition fair Lymphatic: No cervical, supraclavicular or axillary adenopathy Lungs no rales or rhonchi, good excursion bilaterally Heart regular rate and rhythm, no murmur appreciated Abd soft, nontender, positive bowel sounds, no liver or spleen tip palpated on exam, no fluid wave  MSK no focal spinal tenderness, no joint edema Neuro: non-focal, well-oriented, appropriate affect Breasts: Deferred   Lab Results  Component Value Date   WBC 5.0 06/13/2016   HGB 11.4 (L) 06/13/2016   HCT 33.9 (L) 06/13/2016   MCV 84 06/13/2016   PLT 193 06/13/2016   Lab Results  Component Value  Date   FERRITIN 242 08/16/2015   IRON 76 08/16/2015   TIBC 251 08/16/2015   UIBC 175 08/16/2015   IRONPCTSAT 30 08/16/2015   Lab Results  Component Value Date   RBC 4.04 06/13/2016   No results found for: KPAFRELGTCHN, LAMBDASER, KAPLAMBRATIO No results found for: IGGSERUM, IGA, IGMSERUM No results found for: Dorene Ar, A1GS, A2GS, Evans Lance, GAMS, MSPIKE, SPEI   Chemistry      Component Value Date/Time   NA 135 (L) 03/23/2016 1210   K 4.1 03/23/2016 1210   CL 107 11/15/2015 1212   CO2 25 03/23/2016 1210   BUN 11.8 03/23/2016 1210   CREATININE 0.8 03/23/2016 1210      Component Value Date/Time   CALCIUM 9.5 03/23/2016 1210   ALKPHOS 73 03/23/2016 1210   AST 19 03/23/2016 1210   ALT 19 03/23/2016 1210   BILITOT 0.24 03/23/2016 1210      Impression and Plan: Hannah Thompson is a very pleasant 46 yo African American female with chronic thrombus of the left gastrocnemius vein in the left lower extremity. She fell at work last week and was worried about the small marble sized goose egg with bruise on her right shin. She is asymptomatic with this. No pain or swelling. She has been taking her Xarelto 20 mg PO daily and will now decrease to 10 mg PO daily. She will finish 1 year of low dose anticoagulation in June 2019.  We will plan to see her back in September for her already scheduled appointment.  She will contact our office with any questions or concerns. We can certainly see her sooner if need be.   Verdie Mosher, NP 6/12/20181:44 PM

## 2016-06-14 LAB — D-DIMER, QUANTITATIVE (NOT AT ARMC): D-DIMER: 6.19 mg{FEU}/L — AB (ref 0.00–0.49)

## 2016-06-16 ENCOUNTER — Other Ambulatory Visit: Payer: Self-pay | Admitting: Family

## 2016-06-16 DIAGNOSIS — I824Z2 Acute embolism and thrombosis of unspecified deep veins of left distal lower extremity: Secondary | ICD-10-CM

## 2016-06-19 ENCOUNTER — Telehealth: Payer: Self-pay | Admitting: Family

## 2016-06-19 NOTE — Telephone Encounter (Signed)
Called to let her know D-dimer was quite high and that we would be scheduling her for bilateral lower extremity US. She is to continue Xarelto 20 mg PO daily. There was no answer on her personal cell phone so I left instructions and a call back number for her to let us know she got the massage.

## 2016-06-20 ENCOUNTER — Ambulatory Visit (HOSPITAL_BASED_OUTPATIENT_CLINIC_OR_DEPARTMENT_OTHER)
Admission: RE | Admit: 2016-06-20 | Discharge: 2016-06-20 | Disposition: A | Discharge status: Home / Self Care | Payer: BLUE CROSS/BLUE SHIELD | Source: Ambulatory Visit | Attending: Family | Admitting: Family

## 2016-06-20 DIAGNOSIS — I824Z2 Acute embolism and thrombosis of unspecified deep veins of left distal lower extremity: Secondary | ICD-10-CM | POA: Diagnosis present

## 2016-06-21 ENCOUNTER — Telehealth: Payer: Self-pay | Admitting: Family

## 2016-06-21 NOTE — Telephone Encounter (Signed)
Left message with call back number for bilateral lower extremity US results.

## 2016-06-21 NOTE — Telephone Encounter (Signed)
Spoke with [name] and let her know US was negative for DVT. She is asymptomatic at this time other than right shoulder pain and plans to follow-up with her PCP regarding that. She denies SOB, chest pain or palpitations. She will continue on the Xarelto 20 mg PO daily.

## 2016-06-27 ENCOUNTER — Ambulatory Visit (INDEPENDENT_AMBULATORY_CARE_PROVIDER_SITE_OTHER): Payer: BLUE CROSS/BLUE SHIELD | Admitting: Internal Medicine

## 2016-06-27 ENCOUNTER — Encounter: Payer: Self-pay | Admitting: Internal Medicine

## 2016-06-27 DIAGNOSIS — M25511 Pain in right shoulder: Secondary | ICD-10-CM | POA: Diagnosis not present

## 2016-06-27 DIAGNOSIS — Z0001 Encounter for general adult medical examination with abnormal findings: Secondary | ICD-10-CM

## 2016-06-27 NOTE — Progress Notes (Signed)
Subjective:    Patient ID: Hannah Thompson, female    DOB: 1970/05/23, 46 y.o.   MRN: [id number]  HPI  Here for wellness and f/u;  Overall doing ok;  Pt denies Chest pain, worsening SOB, DOE, wheezing, orthopnea, PND, worsening LE edema, palpitations, dizziness or syncope.  Pt denies neurological change such as new headache, facial or extremity weakness.  Pt denies polydipsia, polyuria, or low sugar symptoms. Pt states overall good compliance with treatment and medications, good tolerability, and has been trying to follow appropriate diet.  Pt denies worsening depressive symptoms, suicidal ideation or panic. No fever, night sweats, wt loss, loss of appetite, or other constitutional symptoms.  Pt states good ability with ADL's, has low fall risk, home safety reviewed and adequate, no other significant changes in hearing or vision, and not active with exercise.  Had fall recently with right mid pretibial leg swollen area, eval per hematology - not felt to be clot. Also fell to right shoulder , was initially sore but now 2 wks later still cannot raise right arm above shoulder level.  No prior hx of shoulder surgury or problem Past Medical History:  Diagnosis Date  . ALLERGIC RHINITIS 03/17/2009  . ANEMIA-NOS 03/17/2009  . ASTHMA 03/17/2009  . DISC DISEASE, LUMBAR 03/17/2009   mild facet arthropathy by MRI 2005  . ECZEMA 03/17/2009  . Headache(784.0) 06/21/2009   recurrent  . HYPERLIPIDEMIA 03/17/2009  . KELOID 06/21/2009  . VITAMIN B12 DEFICIENCY 03/18/2009   Past Surgical History:  Procedure Laterality Date  . KELOID EXCISION  mid 1990's   s/p keloid removal-laser     reports that she quit smoking about 8 years ago. Her smoking use included Cigarettes. She started smoking about 12 years ago. She has a 1.00 pack-year smoking history. She has never used smokeless tobacco. She reports that she does not drink alcohol or use drugs. family history includes Alcohol abuse in her other; Allergies in her  sister; Cancer in her father, other, and paternal aunt; Deep vein thrombosis in her sister; Diabetes in her mother and other; Heart disease in her other; Hypertension in her father and mother. Allergies  Allergen Reactions  . Oxaprozin     angioedema  . Sulfonamide Derivatives     REACTION: hives  . Orange Juice [Orange Oil] Hives    Orange juice  . Tomato Hives    tomatoes  . Nsaids Rash   Current Outpatient Prescriptions on File Prior to Visit  Medication Sig Dispense Refill  . acetaminophen (TYLENOL) 325 MG tablet Take 2 tablets (650 mg total) by mouth every 6 (six) hours as needed for mild pain, moderate pain or headache (or Fever >/= 101).    . cetirizine (ZYRTEC) 10 MG tablet Take 1 tablet (10 mg total) by mouth daily. 30 tablet 11  . desonide (DESOWEN) 0.05 % ointment Apply 1 application topically 2 (two) times daily.     Marland Kitchen. EPINEPHrine (EPI-PEN) 0.3 mg/0.3 mL DEVI Inject 0.3 mLs (0.3 mg total) into the muscle daily as needed (Anaphylaxis). 2 Device 1  . hydrochlorothiazide (MICROZIDE) 12.5 MG capsule Take 1 capsule (12.5 mg total) by mouth daily. 90 capsule 1  . rivaroxaban (XARELTO) 20 MG TABS tablet Take 1 tablet (20 mg total) by mouth daily with supper. 30 tablet 11  . traMADol (ULTRAM) 50 MG tablet Take 1 tablet (50 mg total) by mouth every 6 (six) hours as needed. 60 tablet 1  . triamcinolone (NASACORT AQ) 55 MCG/ACT AERO nasal inhaler Place  2 sprays into the nose daily. 1 Inhaler 12  . triamcinolone cream (KENALOG) 0.1 % Apply 1 application topically 2 (two) times daily.      No current facility-administered medications on file prior to visit.    Review of Systems Constitutional: Negative for other unusual diaphoresis, sweats, appetite or weight changes HENT: Negative for other worsening hearing loss, ear pain, facial swelling, mouth sores or neck stiffness.   Eyes: Negative for other worsening pain, redness or other visual disturbance.  Respiratory: Negative for other  stridor or swelling Cardiovascular: Negative for other palpitations or other chest pain  Gastrointestinal: Negative for worsening diarrhea or loose stools, blood in stool, distention or other pain Genitourinary: Negative for hematuria, flank pain or other change in urine volume.  Musculoskeletal: Negative for myalgias or other joint swelling.  Skin: Negative for other color change, or other wound or worsening drainage.  Neurological: Negative for other syncope or numbness. Hematological: Negative for other adenopathy or swelling Psychiatric/Behavioral: Negative for hallucinations, other worsening agitation, SI, self-injury, or new decreased concentration All other system neg per pt    Objective:   Physical Exam BP 130/88   Pulse 65   Ht 5\' 7"  (1.702 m)   Wt 281 lb (127.5 kg)   SpO2 100%   BMI 44.01 kg/m  VS noted, morbid pbese Constitutional: Pt is oriented to person, place, and time. Appears well-developed and well-nourished, in no significant distress and comfortable Head: Normocephalic and atraumatic  Eyes: Conjunctivae and EOM are normal. Pupils are equal, round, and reactive to light Right Ear: External ear normal without discharge Left Ear: External ear normal without discharge Nose: Nose without discharge or deformity Mouth/Throat: Oropharynx is without other ulcerations and moist  Neck: Normal range of motion. Neck supple. No JVD present. No tracheal deviation present or significant neck LA or mass Cardiovascular: Normal rate, regular rhythm, normal heart sounds and intact distal pulses.   Pulmonary/Chest: WOB normal and breath sounds without rales or wheezing  Abdominal: Soft. Bowel sounds are normal. NT. No HSM  Musculoskeletal: Normal range of motion. Exhibits no edema Lymphadenopathy: Has no other cervical adenopathy.  Neurological: Pt is alert and oriented to person, place, and time. Pt has normal reflexes. No cranial nerve deficit. Motor grossly intact, Gait  intact Skin: Skin is warm and dry. No rash noted or new ulcerations Psychiatric:  Has normal mood and affect. Behavior is normal without agitation Right shoulder with diffuse tender but no swelling or brusing, unable to abduct > 90 degrees No other exam findings  Lab Results  Component Value Date   WBC 5.0 06/13/2016   HGB 11.4 (L) 06/13/2016   HCT 33.9 (L) 06/13/2016   PLT 193 06/13/2016   GLUCOSE 92 03/23/2016   CHOL 159 06/16/2010   TRIG 90.0 06/16/2010   HDL 44.50 06/16/2010   LDLCALC 97 06/16/2010   ALT 19 03/23/2016   AST 19 03/23/2016   NA 135 (L) 03/23/2016   K 4.1 03/23/2016   CL 107 11/15/2015   CREATININE 0.8 03/23/2016   BUN 11.8 03/23/2016   CO2 25 03/23/2016   TSH 3.350 09/05/2013   INR 1.1 07/28/2015       Assessment & Plan:

## 2016-06-27 NOTE — Assessment & Plan Note (Signed)

## 2016-06-27 NOTE — Patient Instructions (Addendum)
Please continue all other medications as before, and refills have been done if requested.  Please have the pharmacy call with any other refills you may need.  Please continue your efforts at being more active, low cholesterol diet, and weight control.  You are otherwise up to date with prevention measures today.  Please keep your appointments with your specialists as you may have planned  You will be contacted regarding the referral for: Dr Katrinka BlazingSmith for the right shoulder  Please return in 1 year for your yearly visit, or sooner if needed, with Lab testing done 3-5 days before

## 2016-06-27 NOTE — Assessment & Plan Note (Addendum)
C/w  Likely right shoulder rot cuff tear vs other, for pain control as she has, also refer Sports med in this office for further evaluation and tx  In addition to the time spent performing CPE, I spent an additional 15 minutes face to face,in which greater than 50% of this time was spent in counseling and coordination of care for patient's illness as documented, with respect to differential dx, eval and tx of right shoulder injury

## 2016-06-28 ENCOUNTER — Telehealth: Payer: Self-pay | Admitting: Internal Medicine

## 2016-06-28 NOTE — Telephone Encounter (Signed)
Pt was referred to Dr Katrinka BlazingSmith as a new pt but can not be seen until 07/20/2016. She said that she does not think she can wait that long. Can a referral be sent over to [facility] so that she could be seen sooner?

## 2016-06-29 NOTE — Telephone Encounter (Signed)
She has been rescheduled with Dr. Berline Choughigby

## 2016-07-03 ENCOUNTER — Encounter: Payer: Self-pay | Admitting: Sports Medicine

## 2016-07-03 ENCOUNTER — Ambulatory Visit (INDEPENDENT_AMBULATORY_CARE_PROVIDER_SITE_OTHER): Payer: BLUE CROSS/BLUE SHIELD

## 2016-07-03 ENCOUNTER — Ambulatory Visit (INDEPENDENT_AMBULATORY_CARE_PROVIDER_SITE_OTHER): Payer: BLUE CROSS/BLUE SHIELD | Admitting: Sports Medicine

## 2016-07-03 VITALS — BP 100/80 | HR 86 | Ht 67.0 in | Wt 286.2 lb

## 2016-07-03 DIAGNOSIS — S43101A Unspecified dislocation of right acromioclavicular joint, initial encounter: Secondary | ICD-10-CM

## 2016-07-03 DIAGNOSIS — M25511 Pain in right shoulder: Secondary | ICD-10-CM

## 2016-07-03 DIAGNOSIS — G8929 Other chronic pain: Secondary | ICD-10-CM

## 2016-07-03 NOTE — Assessment & Plan Note (Addendum)
Mechanism of injury is consistent with a grade 1 AC joint separation. Fall directly on the lateral shoulder while carrying a tray. Unable to take NSAIDs due to chronic anticoagulation. Tylenol as needed, frequent icing.  Discussion for sling however she reports overall this is manageable without immobilization. Follow-up in 4 weeks, can consider AC joint injection and follow-up if persistent symptoms.  Body habitus limits utility of MSK ultrasound.
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
## 2016-07-03 NOTE — Patient Instructions (Addendum)
Try picking up capsaicin cream and using this on your shoulder.  You can also use Tylenol and ice on a daily basis.  Acromioclavicular Separation Acromioclavicular separation is an injury to the small joint at the top of the shoulder (acromioclavicular joint or AC joint). The [facility] joint connects the outer tip of the collarbone (clavicle) to the top of the shoulder blade (acromion). Two strong cords of tissue (acromioclavicular ligament and coracoclavicular ligament) stretch across the [facility] joint to keep it in place. An AC joint separation happens when one or both ligaments stretch or tear, causing the joint to separate. There are six types of separation. The type of separation that you have depends on how much the ligaments are damaged and how far the joint has moved out of place. The less severe types of separation are most common. What are the causes? Common causes of this condition include:  A hard, direct hit (blow) to the top of the shoulder.  Falling on the shoulder.  Falling on an outstretched arm.  What increases the risk? This condition is more likely to develop in female athletes under age 13 who participate in sports that involve potential contact, such as:  Football.  Rugby.  Hockey.  Cycling.  Martial arts.  What are the signs or symptoms?  The main symptom of this condition is shoulder pain. Pain may be mild or severe, depending on the type of separation. Other signs and symptoms in the shoulder may include:  Swelling.  Limited range of motion, especially when moving the arm across the body.  Pain and tenderness when touching the top of the shoulder.  Pain when putting weight on the shoulder, such as rolling on the shoulder while sleeping.  A visible bump (deformity) over the joint.  A clicking or popping sound when moving the shoulder.  How is this diagnosed? This condition may be diagnosed based on:  Your symptoms.  Your medical history, including your  history of recent injuries.  A physical exam to check for deformity and limited range of motion.  Imaging tests, such as: ? X-rays. ? MRI. ? Ultrasound.  How is this treated? Treatment for this condition may include:  Resting the shoulder before gradually returning to normal activities.  Icing the shoulder.  NSAIDs to help reduce pain and swelling.  A sling to support your shoulder and keep it from moving.  Physical therapy.  Surgery. This is rare. Surgery may be needed for severe injuries that include breaks (fractures) in a bone, or injuries that do not get better with nonsurgical treatments. Surgery is followed by keeping your joint in place for a period of time (immobilization) and physical therapy.  Follow these instructions at home: If you have a sling:  Wear the sling as told by your health care provider. Remove it only as told by your health care provider.  Reposition the sling if your fingers tingle, become numb, or turn cold and blue.  Do not let your sling get wet if it is not waterproof. Ask your health care provider if you can remove the sling for bathing and showering.  Keep the sling clean. Managing pain, stiffness, and swelling   If directed, apply ice to the injured area. ? Put ice in a plastic bag. ? Place a towel between your skin and the bag. ? Leave the ice on for 20 minutes, 2-3 times a day.  Move your fingers often to avoid stiffness and to lessen swelling. Driving  Do not drive or  operate heavy machinery while taking prescription pain medicine.  Ask your health care provider when it is safe for you to drive. Activity  Rest and return to your normal activities as told by your health care provider. Ask your health care provider what activities are safe for you.  Do exercises as told by your health care provider. General instructions  Do not use any tobacco products, such as cigarettes, chewing tobacco, and e-cigarettes. Tobacco can delay  healing. If you need help quitting, ask your health care provider.  Take over-the-counter and prescription medicines only as told by your health care provider.  Keep all follow-up visits as told by your health care provider. This is important. How is this prevented?  Make sure to use equipment that fits you.  Wear shoulder padding during contact sports.  Be safe and responsible while being active to avoid falls. Contact a health care provider if:  Your pain and stiffness do not improve after 2 weeks. This information is not intended to replace advice given to you by your health care provider. Make sure you discuss any questions you have with your health care provider. Document Released: 12/19/2004 Document Revised: 08/26/2015 Document Reviewed: 11/21/2014 Elsevier Interactive Patient Education  [employer]2018 [employer]vier Inc.

## 2016-07-04 ENCOUNTER — Ambulatory Visit: Payer: BLUE CROSS/BLUE SHIELD | Admitting: Sports Medicine

## 2016-07-10 ENCOUNTER — Telehealth: Payer: Self-pay | Admitting: Sports Medicine
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
## 2016-07-10 NOTE — Telephone Encounter (Signed)
Letter at the front for pick up. Called pt and left VM to call the office.

## 2016-07-11 NOTE — Telephone Encounter (Signed)
Called [phone] and left VM for pt to call the office (can pick up letter at the font desk).

## 2016-07-12 NOTE — Telephone Encounter (Signed)
Unable to reach pt by phone. Letter is at the front for pick up. Should she return my phone call, please advise.

## 2016-07-20 ENCOUNTER — Ambulatory Visit: Payer: BLUE CROSS/BLUE SHIELD | Admitting: Family Medicine

## 2016-08-07 ENCOUNTER — Encounter: Payer: Self-pay | Admitting: Sports Medicine

## 2016-08-07 ENCOUNTER — Ambulatory Visit (INDEPENDENT_AMBULATORY_CARE_PROVIDER_SITE_OTHER): Payer: BLUE CROSS/BLUE SHIELD | Admitting: Sports Medicine

## 2016-08-07 VITALS — BP 110/80 | HR 70 | Ht 67.0 in | Wt 283.0 lb

## 2016-08-07 DIAGNOSIS — S43101D Unspecified dislocation of right acromioclavicular joint, subsequent encounter: Secondary | ICD-10-CM

## 2016-08-07 DIAGNOSIS — M25511 Pain in right shoulder: Secondary | ICD-10-CM | POA: Diagnosis not present
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
## 2016-08-07 NOTE — Patient Instructions (Signed)
If you are not better please call we can get you set up for an MRI.  We should be seeing slow steady improvements over the next 4 weeks.  Continue working on gentle range of motion.  Avoid anything that hurts significantly.

## 2016-08-11 ENCOUNTER — Encounter: Payer: Self-pay | Admitting: Internal Medicine

## 2016-08-11 ENCOUNTER — Ambulatory Visit (INDEPENDENT_AMBULATORY_CARE_PROVIDER_SITE_OTHER): Payer: BLUE CROSS/BLUE SHIELD | Admitting: Internal Medicine

## 2016-08-11 VITALS — BP 122/84 | HR 84 | Temp 98.4°F | Resp 16 | Wt 280.0 lb

## 2016-08-11 DIAGNOSIS — H10212 Acute toxic conjunctivitis, left eye: Secondary | ICD-10-CM

## 2016-08-11 NOTE — Progress Notes (Signed)
Subjective:    Patient ID: Hannah Thompson, female    DOB: [birth year], 46 y.o.   MRN: [id number]  HPI She is here for an acute visit.    She got her hair done 4 days ago and she had a product put around her hairline that she is allergic to.  The lady doing her hair did not realize that, but she has had a reaction from it in the past.  The area started itching and she did wash it off.  She still has some mild bumps in areas.  She think she rubbed her left eye while this was going on and the eye has been painful, red and irritated since then.  The skin around her eye is also irritated and itchy - there are little bumps.    Her left eye throbs a little.  It hurts.  She has photosensitivity and she had difficulty seeing this morning.  She denies discharge.  She did not try washing/rinsing the eye out.   She has been appplying compresses to her left eye and it helps. .   Medications and allergies reviewed with patient and updated if appropriate.  Patient Active Problem List   Diagnosis Date Noted  . Encounter for well adult exam with abnormal findings 06/27/2016  . Acute pain of right shoulder 06/27/2016  . Foreign body in right foot 03/27/2016  . Grade 2 ankle sprain 07/27/2015  . Headache 04/27/2015  . Hyperpigmentation 03/01/2015  . Skin trauma 03/01/2015  . Medial meniscus tear 01/13/2015  . Effusion of left knee 01/13/2015  . Encounter for therapeutic drug monitoring 07/07/2014  . Pedal edema 04/28/2014  . Chronic low back pain 02/03/2014  . Burn of right foot 12/19/2013  . Cellulitis 11/21/2013  . Therapeutic drug monitoring 11/21/2013  . Chronic anticoagulation 10/21/2013  . Pulmonary embolus (HCC) 10/15/2013  . Late effect of superficial injury 09/11/2013  . Pruritus 09/11/2013  . Acute pulmonary embolism-submassive with right heart strain 09/04/2013  . Acute deep vein thrombosis (DVT) of other specified vein of right lower extremity (HCC) 09/04/2013  . Thrombocytopenia  (HCC) 09/04/2013  . Syncope and collapse 09/04/2013  . Grief 08/18/2013  . Right arm pain 05/30/2013  . Angioedema 03/14/2013  . Obesity 05/13/2011  . KELOID 06/21/2009  . SKIN LESION 06/21/2009  . Headache(784.0) 06/21/2009  . VITAMIN B12 DEFICIENCY 03/18/2009  . Hyperlipidemia 03/17/2009  . ANEMIA-NOS 03/17/2009  . Allergic rhinitis 03/17/2009  . Asthma 03/17/2009  . ECZEMA 03/17/2009  . DISC DISEASE, LUMBAR 03/17/2009  . FATIGUE 03/17/2009    Current Outpatient Prescriptions on File Prior to Visit  Medication Sig Dispense Refill  . acetaminophen (TYLENOL) 325 MG tablet Take 2 tablets (650 mg total) by mouth every 6 (six) hours as needed for mild pain, moderate pain or headache (or Fever >/= 101).    . cetirizine (ZYRTEC) 10 MG tablet Take 1 tablet (10 mg total) by mouth daily. 30 tablet 11  . desonide (DESOWEN) 0.05 % ointment Apply 1 application topically 2 (two) times daily.     Marland Kitchen. EPINEPHrine (EPI-PEN) 0.3 mg/0.3 mL DEVI Inject 0.3 mLs (0.3 mg total) into the muscle daily as needed (Anaphylaxis). 2 Device 1  . hydrochlorothiazide (MICROZIDE) 12.5 MG capsule Take 1 capsule (12.5 mg total) by mouth daily. 90 capsule 1  . rivaroxaban (XARELTO) 20 MG TABS tablet Take 1 tablet (20 mg total) by mouth daily with supper. 30 tablet 11  . traMADol (ULTRAM) 50 MG tablet Take 1 tablet (  50 mg total) by mouth every 6 (six) hours as needed. 60 tablet 1  . triamcinolone (NASACORT AQ) 55 MCG/ACT AERO nasal inhaler Place 2 sprays into the nose daily. 1 Inhaler 12  . triamcinolone cream (KENALOG) 0.1 % Apply 1 application topically 2 (two) times daily.      No current facility-administered medications on file prior to visit.     Past Medical History:  Diagnosis Date  . ALLERGIC RHINITIS 03/17/2009  . ANEMIA-NOS 03/17/2009  . ASTHMA 03/17/2009  . DISC DISEASE, LUMBAR 03/17/2009   mild facet arthropathy by MRI 2005  . ECZEMA 03/17/2009  . Headache(784.0) 06/21/2009   recurrent  . HYPERLIPIDEMIA  03/17/2009  . KELOID 06/21/2009  . VITAMIN B12 DEFICIENCY 03/18/2009    Past Surgical History:  Procedure Laterality Date  . KELOID EXCISION  mid 1990's   s/p keloid removal-laser     Social History   Social History  . Marital status: Single    Spouse name: N/A  . Number of children: 3  . Years of education: N/A   Occupational History  . Hair stylist    Social History Main Topics  . Smoking status: Former Smoker    Packs/day: 0.25    Years: 4.00    Types: Cigarettes    Start date: 03/17/2004    Quit date: 04/17/2008  . Smokeless tobacco: Never Used     Comment: quit 5 years ago  . Alcohol use No     Comment: socially  . Drug use: No  . Sexual activity: Not Currently    Birth control/ protection: IUD   Other Topics Concern  . None   Social History Narrative  . None    Family History  Problem Relation Age of Onset  . Cancer Father        Prostate Cancer  . Hypertension Father   . Allergies Sister   . Cancer Paternal Aunt        Ovarian Cancer  . Cancer Other        Grandparent-Lung Cancer  . Diabetes Other        Grandparent  . Heart disease Other        Grandparent  . Alcohol abuse Other        Several on both sides of family-3 uncles  . Hypertension Mother   . Diabetes Mother   . Deep vein thrombosis Sister     Review of Systems  Constitutional: Negative for chills and fever.  Eyes: Positive for photophobia, pain, redness, itching and visual disturbance. Negative for discharge.  Skin: Positive for rash (bumps around left eye and some near hairline).       Objective:   Vitals:   08/11/16 1449  BP: 122/84  Pulse: 84  Resp: 16  Temp: 98.4 F (36.9 C)  SpO2: 98%   Filed Weights   08/11/16 1449  Weight: 280 lb (127 kg)   Body mass index is 43.85 kg/m.  Wt Readings from Last 3 Encounters:  08/11/16 280 lb (127 kg)  08/07/16 283 lb (128.4 kg)  07/03/16 286 lb 3.2 oz (129.8 kg)     Physical Exam  Constitutional: She appears  well-developed and well-nourished. No distress.  Eyes: Pupils are equal, round, and reactive to light. EOM are normal. Right eye exhibits no discharge. No scleral icterus.  Right conjunctiva normal, left conjunctiva erythematous, positive photosensitivity  Skin: Skin is warm and dry. Rash (raised papular rash around left eye) noted. She is not diaphoretic.  Assessment & Plan:   See Problem List for Assessment and Plan of chronic medical problems.

## 2016-08-11 NOTE — Patient Instructions (Signed)
Call your eye doctor now.  Start using artifical tears frequently for your eye irritation.

## 2016-08-12 DIAGNOSIS — H10212 Acute toxic conjunctivitis, left eye: Secondary | ICD-10-CM | POA: Insufficient documentation

## 2016-08-12 NOTE — Assessment & Plan Note (Signed)
Given eye pain, visual disturbance and photosensitivity she will call her eye doctor and see him today - may need topical steroids

## 2016-08-26 NOTE — Assessment & Plan Note (Signed)
Symptoms are consistent with a grade 1 AC separation versus potential small rotator cuff etiology.  If she is not continuing to improve we did discuss the options for MRI but she feels as though she is doing better he would like to hold off on this for an additional 4 weeks.  If any lack of improvement at that time will need to plan to obtain further diagnostic evaluation with MRI given body habitus limits utility of MSK ultrasound

## 2016-09-05 ENCOUNTER — Ambulatory Visit: Payer: BLUE CROSS/BLUE SHIELD | Admitting: Sports Medicine

## 2016-09-11 ENCOUNTER — Encounter: Payer: Self-pay | Admitting: Sports Medicine

## 2016-09-11 ENCOUNTER — Ambulatory Visit (INDEPENDENT_AMBULATORY_CARE_PROVIDER_SITE_OTHER): Payer: BLUE CROSS/BLUE SHIELD | Admitting: Sports Medicine

## 2016-09-11 VITALS — BP 110/82 | HR 62 | Ht 67.0 in | Wt 283.0 lb

## 2016-09-11 DIAGNOSIS — G8929 Other chronic pain: Secondary | ICD-10-CM | POA: Diagnosis not present

## 2016-09-11 DIAGNOSIS — S43101D Unspecified dislocation of right acromioclavicular joint, subsequent encounter: Secondary | ICD-10-CM

## 2016-09-11 DIAGNOSIS — M25511 Pain in right shoulder: Secondary | ICD-10-CM

## 2016-09-11 NOTE — Assessment & Plan Note (Signed)
Symptoms initially consistent with a isolated AC joint separation, grade 1 but this seems to have evolved unfortunately.  She is now having weakness that is worse than in the past and pain with forward flexion as well as abduction of the shoulder.  Concern for potential rotator cuff tear including posterior fibers of the supraspinatus and/or labral tear/biceps tendon tear.  We will plan to obtain an MRI arthrogram given the failure of conservative measures including anti-inflammatories and formal exercise program.  Been going on for greater than 6 weeks.

## 2016-09-11 NOTE — Progress Notes (Signed)
OFFICE VISIT NOTE Veverly FellsMichael D. Delorise Shinerigby, DO  [facility] [place] [phone]  Hannah Thompson - 46 y.o. Thompson MRN [id number]  Date of birth: September 16, 1970  Visit Date: 09/11/2016  PCP: Corwin LevinsJohn, James W, MD   Referred by: Corwin LevinsJohn, James W, MD  Orlie DakinBrandy Shelton, CMA acting as scribe for Dr. Berline Choughigby.  SUBJECTIVE:   Chief Complaint  Patient presents with  . Follow-up    RT shoulder pain   HPI: As below and per problem based documentation when appropriate.  Hannah Thompson is an established patient presenting today in follow-up of RT sided acromioclavicular joint separation. She was last seen 08/07/2016 and was advised to continue Tylenol, icing prn, and gentle ROM exercises.   Pt has been compliant with ROM exercises, Tylenol prn, and icing the shoulder as needed. Her ROM has increased but she still has a little pain while raising the arm overhead. She denies weakness in the RT arm unless she tried to throw something. She has not noticed any swelling. Stiffness has improved. Tylenol and ice help with the pain. She has no pain at rest.     Review of Systems  Constitutional: Negative for chills and fever.  Respiratory: Negative for shortness of breath and wheezing.   Cardiovascular: Negative for chest pain and palpitations.  Gastrointestinal: Negative.   Genitourinary: Negative.   Musculoskeletal: Positive for joint pain. Negative for falls.  Neurological: Negative for dizziness, tingling and headaches.  Endo/Heme/Allergies: Does not bruise/bleed easily.    Otherwise per HPI.  HISTORY & PERTINENT PRIOR DATA:  No specialty comments available. She reports that she quit smoking about 8 years ago. Her smoking use included Cigarettes. She started smoking about 12 years ago. She has a 1.00 pack-year smoking history. She has never used smokeless tobacco. No results for input(s): HGBA1C, LABURIC in the last 8760 hours. Medications & Allergies reviewed per EMR Patient Active  Problem List   Diagnosis Date Noted  . Toxic conjunctivitis, left 08/12/2016  . Encounter for well adult exam with abnormal findings 06/27/2016  . Acute pain of right shoulder 06/27/2016  . Foreign body in right foot 03/27/2016  . Grade 2 ankle sprain 07/27/2015  . Headache 04/27/2015  . Hyperpigmentation 03/01/2015  . Skin trauma 03/01/2015  . Medial meniscus tear 01/13/2015  . Effusion of left knee 01/13/2015  . Encounter for therapeutic drug monitoring 07/07/2014  . Pedal edema 04/28/2014  . Chronic low back pain 02/03/2014  . Burn of right foot 12/19/2013  . Cellulitis 11/21/2013  . Therapeutic drug monitoring 11/21/2013  . Chronic anticoagulation 10/21/2013  . Pulmonary embolus (HCC) 10/15/2013  . Late effect of superficial injury 09/11/2013  . Pruritus 09/11/2013  . Acute pulmonary embolism-submassive with right heart strain 09/04/2013  . Acute deep vein thrombosis (DVT) of other specified vein of right lower extremity (HCC) 09/04/2013  . Thrombocytopenia (HCC) 09/04/2013  . Syncope and collapse 09/04/2013  . Grief 08/18/2013  . Right arm pain 05/30/2013  . Angioedema 03/14/2013  . Obesity 05/13/2011  . KELOID 06/21/2009  . SKIN LESION 06/21/2009  . Headache(784.0) 06/21/2009  . VITAMIN B12 DEFICIENCY 03/18/2009  . Hyperlipidemia 03/17/2009  . ANEMIA-NOS 03/17/2009  . Allergic rhinitis 03/17/2009  . Asthma 03/17/2009  . ECZEMA 03/17/2009  . DISC DISEASE, LUMBAR 03/17/2009  . FATIGUE 03/17/2009   Past Medical History:  Diagnosis Date  . ALLERGIC RHINITIS 03/17/2009  . ANEMIA-NOS 03/17/2009  . ASTHMA 03/17/2009  . DISC DISEASE, LUMBAR 03/17/2009   mild facet arthropathy  by MRI 2005  . ECZEMA 03/17/2009  . Headache(784.0) 06/21/2009   recurrent  . HYPERLIPIDEMIA 03/17/2009  . KELOID 06/21/2009  . VITAMIN B12 DEFICIENCY 03/18/2009   Family History  Problem Relation Age of Onset  . Cancer Father        Prostate Cancer  . Hypertension Father   . Allergies Sister     . Cancer Paternal Aunt        Ovarian Cancer  . Cancer Other        Grandparent-Lung Cancer  . Diabetes Other        Grandparent  . Heart disease Other        Grandparent  . Alcohol abuse Other        Several on both sides of family-3 uncles  . Hypertension Mother   . Diabetes Mother   . Deep vein thrombosis Sister    Past Surgical History:  Procedure Laterality Date  . KELOID EXCISION  mid 1990's   s/p keloid removal-laser    Social History   Occupational History  . Hair stylist    Social History Main Topics  . Smoking status: Former Smoker    Packs/day: 0.25    Years: 4.00    Types: Cigarettes    Start date: 03/17/2004    Quit date: 04/17/2008  . Smokeless tobacco: Never Used     Comment: quit 5 years ago  . Alcohol use No     Comment: socially  . Drug use: No  . Sexual activity: Not Currently    Birth control/ protection: IUD    OBJECTIVE:  VS:  HT:5\' 7"  (170.2 cm)   WT:283 lb (128.4 kg)  BMI:44.31    BP:110/82  HR:62bpm  TEMP: ( )  RESP:95 % EXAM: Findings:  Right shoulder is quite large.  She has focal pain over the anterior aspect including over the [facility] joint.  She has pain over the proximal biceps with this is not entirely reproducible.  She does have weakness with forward flexion.  Slight weakness with external rotation with small amount of pain.  Empty can testing is normal but she does have pain with resisted shoulder abduction and held in a 90 position.  Mild pain with brachial plexus squeeze with this is minimal.  No radiation.  No pain with arm squeeze test.  Overall good and symmetric overhead range of motion of bilateral shoulders.     No results found. ASSESSMENT & PLAN:     ICD-10-CM   1. Acromioclavicular joint separation, type 1, right, subsequent encounter S43.101D DG Arthro Shoulder Right  2. Acute pain of right shoulder M25.511 DG Arthro Shoulder Right  3. Chronic right shoulder pain M25.511 MR SHOULDER RIGHT W CONTRAST   G89.29 DG  Arthro Shoulder Right  ================================================================= Acute pain of right shoulder Symptoms initially consistent with a isolated AC joint separation, grade 1 but this seems to have evolved unfortunately.  She is now having weakness that is worse than in the past and pain with forward flexion as well as abduction of the shoulder.  Concern for potential rotator cuff tear including posterior fibers of the supraspinatus and/or labral tear/biceps tendon tear.  We will plan to obtain an MRI arthrogram given the failure of conservative measures including anti-inflammatories and formal exercise program.  Been going on for greater than 6 weeks. =================================================================  Follow-up: Return for MRI review in person.   CMA/ATC served as Neurosurgeon during this visit. History, Physical, and Plan performed by medical provider. Documentation and orders  reviewed and attested to.      Teresa Coombs, [facility] Physician

## 2016-09-11 NOTE — Patient Instructions (Signed)

## 2016-09-25 ENCOUNTER — Ambulatory Visit (HOSPITAL_BASED_OUTPATIENT_CLINIC_OR_DEPARTMENT_OTHER): Payer: BLUE CROSS/BLUE SHIELD

## 2016-09-25 ENCOUNTER — Other Ambulatory Visit: Payer: Self-pay | Admitting: Family

## 2016-09-25 ENCOUNTER — Ambulatory Visit (HOSPITAL_BASED_OUTPATIENT_CLINIC_OR_DEPARTMENT_OTHER): Payer: BLUE CROSS/BLUE SHIELD | Admitting: Hematology & Oncology

## 2016-09-25 ENCOUNTER — Other Ambulatory Visit (HOSPITAL_BASED_OUTPATIENT_CLINIC_OR_DEPARTMENT_OTHER): Payer: BLUE CROSS/BLUE SHIELD

## 2016-09-25 VITALS — BP 131/75 | HR 81 | Temp 98.3°F | Resp 18 | Wt 278.0 lb

## 2016-09-25 DIAGNOSIS — Z86718 Personal history of other venous thrombosis and embolism: Secondary | ICD-10-CM | POA: Diagnosis not present

## 2016-09-25 DIAGNOSIS — I2690 Septic pulmonary embolism without acute cor pulmonale: Secondary | ICD-10-CM

## 2016-09-25 DIAGNOSIS — I82491 Acute embolism and thrombosis of other specified deep vein of right lower extremity: Secondary | ICD-10-CM

## 2016-09-25 DIAGNOSIS — Z7901 Long term (current) use of anticoagulants: Secondary | ICD-10-CM

## 2016-09-25 DIAGNOSIS — Z86711 Personal history of pulmonary embolism: Secondary | ICD-10-CM | POA: Diagnosis not present

## 2016-09-25 DIAGNOSIS — I824Y2 Acute embolism and thrombosis of unspecified deep veins of left proximal lower extremity: Secondary | ICD-10-CM

## 2016-09-25 LAB — COMPREHENSIVE METABOLIC PANEL
ALT: 11 U/L (ref 0–55)
AST: 17 U/L (ref 5–34)
Albumin: 3.7 g/dL (ref 3.5–5.0)
Alkaline Phosphatase: 72 U/L (ref 40–150)
Anion Gap: 8 mEq/L (ref 3–11)
BUN: 9.5 mg/dL (ref 7.0–26.0)
CALCIUM: 9.6 mg/dL (ref 8.4–10.4)
CHLORIDE: 105 meq/L (ref 98–109)
CO2: 28 mEq/L (ref 22–29)
CREATININE: 0.8 mg/dL (ref 0.6–1.1)
EGFR: >90 mL/min/{1.73_m2} (ref >90)
Glucose: 79 mg/dl (ref 70–140)
Potassium: 4.1 mEq/L (ref 3.5–5.1)
Sodium: 140 mEq/L (ref 136–145)
Total Bilirubin: 0.26 mg/dL (ref 0.20–1.20)
Total Protein: 7.7 g/dL (ref 6.4–8.3)

## 2016-09-25 LAB — CBC WITH DIFFERENTIAL (CANCER CENTER ONLY)
BASO#: 0 10*3/uL (ref 0.0–0.2)
BASO%: 0.2 % (ref 0.0–2.0)
EOS%: 9.2 % — AB (ref 0.0–7.0)
Eosinophils Absolute: 0.5 10*3/uL (ref 0.0–0.5)
HEMATOCRIT: 35.2 % (ref 34.8–46.6)
HGB: 11.8 g/dL (ref 11.6–15.9)
LYMPH#: 2.1 10*3/uL (ref 0.9–3.3)
LYMPH%: 41.9 % (ref 14.0–48.0)
MCH: 28.4 pg (ref 26.0–34.0)
MCHC: 33.5 g/dL (ref 32.0–36.0)
MCV: 85 fL (ref 81–101)
MONO#: 0.4 10*3/uL (ref 0.1–0.9)
MONO%: 7.6 % (ref 0.0–13.0)
NEUT#: 2 10*3/uL (ref 1.5–6.5)
NEUT%: 41.1 % (ref 39.6–80.0)
PLATELETS: 184 10*3/uL (ref 145–400)
RBC: 4.16 10*6/uL (ref 3.70–5.32)
RDW: 14.5 % (ref 11.1–15.7)
WBC: 4.9 10*3/uL (ref 3.9–10.0)

## 2016-09-25 NOTE — Progress Notes (Signed)
Hematology and Oncology Follow Up Visit  Hannah Thompson [id number] [birth year] 46 y.o. 09/25/2016   Principle Diagnosis:  Acute thrombus in the left gastrocnemius vein History of pulmonary embolism-resolved Ethnic associated leukopenia Transient thrombocytopenia  Current Therapy:   Xarelto 10 mg by mouth daily - to complete 1 year in June 2019         Interim History:  Hannah Thompson is here today for follow-up. She has a tough weekend. Apparently, one of the people that has go to school with her his died. He had ALS.  Otherwise, she seems be doing pretty well. She has not had any pain in her lower legs. She now is on 10 mg of Xarelto. This is a maintenance dose for her.  She has had some heavier cycles. I'm sure this probably is from the Xarelto.  There is no problems with cough or shortness of breath. She has no chest wall pain. There is no nausea or vomiting. There is no headache.  She's had no other change in medications.  She's had no fever.   ECOG Performance Status: 1 - Symptomatic but completely ambulatory  Medications:  Allergies as of 09/25/2016      Reactions   Oxaprozin    angioedema   Sulfonamide Derivatives    REACTION: hives   Orange Juice [orange Oil] Hives   Orange juice   Tomato Hives   tomatoes   Nsaids Rash      Medication List       Accurate as of 09/25/16 12:25 PM. Always use your most recent med list.          acetaminophen 325 MG tablet Commonly known as:  TYLENOL Take 2 tablets (650 mg total) by mouth every 6 (six) hours as needed for mild pain, moderate pain or headache (or Fever >/= 101).   cetirizine 10 MG tablet Commonly known as:  ZYRTEC Take 1 tablet (10 mg total) by mouth daily.   desonide 0.05 % ointment Commonly known as:  DESOWEN Apply 1 application topically 2 (two) times daily.   EPINEPHrine 0.3 mg/0.3 mL Devi Commonly known as:  EPI-PEN Inject 0.3 mLs (0.3 mg total) into the muscle daily as needed (Anaphylaxis).     hydrochlorothiazide 12.5 MG capsule Commonly known as:  MICROZIDE Take 1 capsule (12.5 mg total) by mouth daily.   rivaroxaban 20 MG Tabs tablet Commonly known as:  XARELTO Take 1 tablet (20 mg total) by mouth daily with supper.   traMADol 50 MG tablet Commonly known as:  ULTRAM Take 1 tablet (50 mg total) by mouth every 6 (six) hours as needed.   triamcinolone 55 MCG/ACT Aero nasal inhaler Commonly known as:  NASACORT AQ Place 2 sprays into the nose daily.   triamcinolone cream 0.1 % Commonly known as:  KENALOG Apply 1 application topically 2 (two) times daily.       Allergies:  Allergies  Allergen Reactions  . Oxaprozin     angioedema  . Sulfonamide Derivatives     REACTION: hives  . Orange Juice [Orange Oil] Hives    Orange juice  . Tomato Hives    tomatoes  . Nsaids Rash    Past Medical History, Surgical history, Social history, and Family History were reviewed and updated.  Review of Systems: As stated in the interim history  Physical Exam:  weight is 278 lb (126.1 kg). Her oral temperature is 98.3 F (36.8 C). Her blood pressure is 131/75 and her pulse is 81. Her respiration is  18 and oxygen saturation is 100%.   Wt Readings from Last 3 Encounters:  09/25/16 278 lb (126.1 kg)  09/11/16 283 lb (128.4 kg)  08/11/16 280 lb (127 kg)    Obese African American female. Head and neck exam shows no ocular or oral lesions. There are no palpable cervical or supraclavicular lymph nodes. Lungs are clear bilaterally. Cardiac exam regular rate and rhythm with no murmurs, rubs or bruits. Abdomen is soft. She is obese. She has good bowel sounds. Extremities shows no palpable venous cord in the lower legs. She'll negative Homans's sign on the left leg. She has good range of motion of her joints. Skin exam shows no rashes, ecchymoses or petechia. Neurological exam is nonfocal.  Lab Results  Component Value Date   WBC 4.9 09/25/2016   HGB 11.8 09/25/2016   HCT 35.2  09/25/2016   MCV 85 09/25/2016   PLT 184 09/25/2016   Lab Results  Component Value Date   FERRITIN 242 08/16/2015   IRON 76 08/16/2015   TIBC 251 08/16/2015   UIBC 175 08/16/2015   IRONPCTSAT 30 08/16/2015   Lab Results  Component Value Date   RBC 4.16 09/25/2016   No results found for: KPAFRELGTCHN, LAMBDASER, KAPLAMBRATIO No results found for: IGGSERUM, IGA, IGMSERUM No results found for: Dorene Ar, A1GS, A2GS, Colin Benton, MSPIKE, SPEI   Chemistry      Component Value Date/Time   NA 135 (L) 03/23/2016 1210   K 4.1 03/23/2016 1210   CL 107 11/15/2015 1212   CO2 25 03/23/2016 1210   BUN 11.8 03/23/2016 1210   CREATININE 0.8 03/23/2016 1210      Component Value Date/Time   CALCIUM 9.5 03/23/2016 1210   ALKPHOS 73 03/23/2016 1210   AST 19 03/23/2016 1210   ALT 19 03/23/2016 1210   BILITOT 0.24 03/23/2016 1210      Impression and Plan: Hannah Thompson is a very pleasant 46 yo African American female with a past history of thrombus of the left gastrocnemius vein in the left lower extremity.   She had a Doppler done in June. This did not show any thrombus in the left leg.  From my point of view, she is doing well. We will continue her on the low-dose Xarelto. She will finishes up in June 2019.  I will plan to see her back in 4 months. I think we get her through the holidays.  Josph Macho, MD 9/24/201812:25 PM

## 2016-09-26 LAB — D-DIMER, QUANTITATIVE (NOT AT ARMC): D-DIMER: 6.68 mg{FEU}/L — AB (ref 0.00–0.49)

## 2016-10-17 ENCOUNTER — Inpatient Hospital Stay (HOSPITAL_BASED_OUTPATIENT_CLINIC_OR_DEPARTMENT_OTHER)
Admission: EM | Admit: 2016-10-17 | Discharge: 2016-10-19 | DRG: 176 | Disposition: A | Discharge status: Home / Self Care | Payer: BLUE CROSS/BLUE SHIELD | Attending: Internal Medicine | Admitting: Internal Medicine

## 2016-10-17 ENCOUNTER — Telehealth: Payer: Self-pay | Admitting: *Deleted

## 2016-10-17 ENCOUNTER — Emergency Department (HOSPITAL_BASED_OUTPATIENT_CLINIC_OR_DEPARTMENT_OTHER): Payer: BLUE CROSS/BLUE SHIELD

## 2016-10-17 ENCOUNTER — Encounter (HOSPITAL_BASED_OUTPATIENT_CLINIC_OR_DEPARTMENT_OTHER): Payer: Self-pay

## 2016-10-17 DIAGNOSIS — Z86711 Personal history of pulmonary embolism: Secondary | ICD-10-CM | POA: Diagnosis not present

## 2016-10-17 DIAGNOSIS — Z8249 Family history of ischemic heart disease and other diseases of the circulatory system: Secondary | ICD-10-CM

## 2016-10-17 DIAGNOSIS — J309 Allergic rhinitis, unspecified: Secondary | ICD-10-CM | POA: Diagnosis present

## 2016-10-17 DIAGNOSIS — E669 Obesity, unspecified: Secondary | ICD-10-CM | POA: Diagnosis present

## 2016-10-17 DIAGNOSIS — E785 Hyperlipidemia, unspecified: Secondary | ICD-10-CM | POA: Diagnosis present

## 2016-10-17 DIAGNOSIS — I10 Essential (primary) hypertension: Secondary | ICD-10-CM | POA: Diagnosis present

## 2016-10-17 DIAGNOSIS — T45516A Underdosing of anticoagulants, initial encounter: Secondary | ICD-10-CM | POA: Diagnosis present

## 2016-10-17 DIAGNOSIS — Z91018 Allergy to other foods: Secondary | ICD-10-CM

## 2016-10-17 DIAGNOSIS — Z6841 Body Mass Index (BMI) 40.0 and over, adult: Secondary | ICD-10-CM | POA: Diagnosis not present

## 2016-10-17 DIAGNOSIS — Z8489 Family history of other specified conditions: Secondary | ICD-10-CM | POA: Diagnosis not present

## 2016-10-17 DIAGNOSIS — Z888 Allergy status to other drugs, medicaments and biological substances status: Secondary | ICD-10-CM

## 2016-10-17 DIAGNOSIS — Z86718 Personal history of other venous thrombosis and embolism: Secondary | ICD-10-CM

## 2016-10-17 DIAGNOSIS — D649 Anemia, unspecified: Secondary | ICD-10-CM | POA: Diagnosis present

## 2016-10-17 DIAGNOSIS — Z91040 Latex allergy status: Secondary | ICD-10-CM | POA: Diagnosis not present

## 2016-10-17 DIAGNOSIS — I2609 Other pulmonary embolism with acute cor pulmonale: Secondary | ICD-10-CM | POA: Diagnosis present

## 2016-10-17 DIAGNOSIS — Z87891 Personal history of nicotine dependence: Secondary | ICD-10-CM

## 2016-10-17 DIAGNOSIS — I2699 Other pulmonary embolism without acute cor pulmonale: Secondary | ICD-10-CM | POA: Diagnosis not present

## 2016-10-17 DIAGNOSIS — Z882 Allergy status to sulfonamides status: Secondary | ICD-10-CM

## 2016-10-17 DIAGNOSIS — I5030 Unspecified diastolic (congestive) heart failure: Secondary | ICD-10-CM | POA: Diagnosis not present

## 2016-10-17 DIAGNOSIS — Z23 Encounter for immunization: Secondary | ICD-10-CM | POA: Diagnosis not present

## 2016-10-17 DIAGNOSIS — E876 Hypokalemia: Secondary | ICD-10-CM | POA: Diagnosis present

## 2016-10-17 DIAGNOSIS — J45909 Unspecified asthma, uncomplicated: Secondary | ICD-10-CM | POA: Diagnosis present

## 2016-10-17 DIAGNOSIS — Z886 Allergy status to analgesic agent status: Secondary | ICD-10-CM

## 2016-10-17 DIAGNOSIS — I82409 Acute embolism and thrombosis of unspecified deep veins of unspecified lower extremity: Secondary | ICD-10-CM | POA: Diagnosis not present

## 2016-10-17 DIAGNOSIS — Z91120 Patient's intentional underdosing of medication regimen due to financial hardship: Secondary | ICD-10-CM

## 2016-10-17 HISTORY — DX: Acute embolism and thrombosis of unspecified deep veins of unspecified lower extremity: I82.409

## 2016-10-17 HISTORY — DX: Other pulmonary embolism without acute cor pulmonale: I26.99

## 2016-10-17 LAB — CBC WITH DIFFERENTIAL/PLATELET
BASOS ABS: 0 10*3/uL (ref 0.0–0.1)
Basophils Relative: 0 %
EOS ABS: 0.5 10*3/uL (ref 0.0–0.7)
EOS PCT: 8 %
HCT: 34.5 % — ABNORMAL LOW (ref 36.0–46.0)
Hemoglobin: 11.3 g/dL — ABNORMAL LOW (ref 12.0–15.0)
LYMPHS ABS: 2.1 10*3/uL (ref 0.7–4.0)
Lymphocytes Relative: 38 %
MCH: 27.5 pg (ref 26.0–34.0)
MCHC: 32.8 g/dL (ref 30.0–36.0)
MCV: 83.9 fL (ref 78.0–100.0)
MONO ABS: 0.4 10*3/uL (ref 0.1–1.0)
Monocytes Relative: 7 %
Neutro Abs: 2.6 10*3/uL (ref 1.7–7.7)
Neutrophils Relative %: 47 %
PLATELETS: 171 10*3/uL (ref 150–400)
RBC: 4.11 MIL/uL (ref 3.87–5.11)
RDW: 14.4 % (ref 11.5–15.5)
WBC: 5.6 10*3/uL (ref 4.0–10.5)

## 2016-10-17 LAB — TROPONIN I

## 2016-10-17 LAB — BASIC METABOLIC PANEL
Anion gap: 5 (ref 5–15)
BUN: 10 mg/dL (ref 6–20)
CALCIUM: 9 mg/dL (ref 8.9–10.3)
CO2: 26 mmol/L (ref 22–32)
Chloride: 106 mmol/L (ref 101–111)
Creatinine, Ser: 0.74 mg/dL (ref 0.44–1.00)
GFR calc Af Amer: >60 mL/min (ref >60)
GLUCOSE: 113 mg/dL — AB (ref 65–99)
POTASSIUM: 3.4 mmol/L — AB (ref 3.5–5.1)
SODIUM: 137 mmol/L (ref 135–145)

## 2016-10-17 LAB — BRAIN NATRIURETIC PEPTIDE: B Natriuretic Peptide: 15.5 pg/mL (ref 0.0–100.0)

## 2016-10-17 LAB — MAGNESIUM: MAGNESIUM: 1.8 mg/dL (ref 1.7–2.4)

## 2016-10-17 LAB — HEPARIN LEVEL (UNFRACTIONATED): Heparin Unfractionated: 0.7 IU/mL (ref 0.30–0.70)

## 2016-10-17 LAB — MRSA PCR SCREENING: MRSA by PCR: NEGATIVE

## 2016-10-17 MED ORDER — HEPARIN (PORCINE) IN NACL 100-0.45 UNIT/ML-% IJ SOLN: 1600.0000 [IU]/h | INTRAMUSCULAR | Status: DC

## 2016-10-17 MED ORDER — POTASSIUM CHLORIDE CRYS ER 20 MEQ PO TBCR
40.0000 meq | EXTENDED_RELEASE_TABLET | Freq: Once | ORAL | Status: AC
  Administered 2016-10-17: 40 meq via ORAL
  Filled 2016-10-17: qty 2

## 2016-10-17 MED ORDER — HEPARIN (PORCINE) IN NACL 100-0.45 UNIT/ML-% IJ SOLN
1450.0000 [IU]/h | INTRAMUSCULAR | Status: DC
  Administered 2016-10-17 – 2016-10-18 (×2): 1450 [IU]/h via INTRAVENOUS
  Filled 2016-10-17: qty 250

## 2016-10-17 MED ORDER — HEPARIN BOLUS VIA INFUSION
5500.0000 [IU] | Freq: Once | INTRAVENOUS | Status: AC
  Administered 2016-10-17: 5500 [IU] via INTRAVENOUS

## 2016-10-17 MED ORDER — IOPAMIDOL (ISOVUE-370) INJECTION 76%
100.0000 mL | Freq: Once | INTRAVENOUS | Status: AC | PRN
  Administered 2016-10-17: 100 mL via INTRAVENOUS

## 2016-10-17 MED ORDER — HEPARIN BOLUS VIA INFUSION: 5500.0000 [IU] | Freq: Once | INTRAVENOUS | Status: DC

## 2016-10-17 MED ORDER — HEPARIN (PORCINE) IN NACL 100-0.45 UNIT/ML-% IJ SOLN
1600.0000 [IU]/h | INTRAMUSCULAR | Status: DC
  Administered 2016-10-17: 1600 [IU]/h via INTRAVENOUS

## 2016-10-17 MED ORDER — IPRATROPIUM-ALBUTEROL 0.5-2.5 (3) MG/3ML IN SOLN: 3.0000 mL | RESPIRATORY_TRACT | Status: DC | PRN

## 2016-10-17 MED ORDER — HEPARIN (PORCINE) IN NACL 100-0.45 UNIT/ML-% IJ SOLN
INTRAMUSCULAR | Status: AC
  Filled 2016-10-17: qty 250

## 2016-10-17 MED ORDER — INFLUENZA VAC SPLIT QUAD 0.5 ML IM SUSY
0.5000 mL | PREFILLED_SYRINGE | INTRAMUSCULAR | Status: AC
  Administered 2016-10-18: 0.5 mL via INTRAMUSCULAR
  Filled 2016-10-17: qty 0.5
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
## 2016-10-17 NOTE — ED Notes (Signed)
Attempt to call report nurse not available 

## 2016-10-17 NOTE — Telephone Encounter (Signed)
Patient c/o shortness of breath. Patient sounds labored while speaking.   She has a history of DVT and PE  Patient instructed to go to the emergency room for workup to rule out possible PE. She understands instructions and will go.  Dr Myna Hidalgo notified.

## 2016-10-17 NOTE — ED Triage Notes (Signed)
C/o SOB x 2 week-worse x 5 days-NAD-steady gait

## 2016-10-17 NOTE — Progress Notes (Signed)
ANTICOAGULATION CONSULT NOTE - Initial Consult  Pharmacy Consult for heparin dosing Indication: pulmonary embolus  Allergies  Allergen Reactions  . Oxaprozin     angioedema  . Sulfonamide Derivatives     REACTION: hives  . Orange Juice [Orange Oil] Hives    Orange juice  . Tomato Hives    tomatoes  . Nsaids Rash    Patient Measurements: Height:  (170.2 cm) Weight: 275 lb (124.7 kg) IBW/kg (Calculated) : 61.6 Heparin Dosing Weight: 91.3 kg  Vital Signs: Temp: 98 F (36.7 C) (10/16 1340) Temp Source: Oral (10/16 1340) BP: 116/64 (10/16 1434) Pulse Rate: 100 (10/16 1434)  Labs:  Recent Labs  10/17/16 1344  HGB 11.3*  HCT 34.5*  PLT 171  CREATININE 0.74  TROPONINI <0.03    Estimated Creatinine Clearance: 121.7 mL/min (by C-G formula based on SCr of 0.74 mg/dL).   Medical History: Past Medical History:  Diagnosis Date  . ALLERGIC RHINITIS 03/17/2009  . ANEMIA-NOS 03/17/2009  . ASTHMA 03/17/2009  . DISC DISEASE, LUMBAR 03/17/2009   mild facet arthropathy by MRI 2005  . DVT (deep venous thrombosis) (HCC)   . ECZEMA 03/17/2009  . Headache(784.0) 06/21/2009   recurrent  . HYPERLIPIDEMIA 03/17/2009  . KELOID 06/21/2009  . Pulmonary emboli (HCC)   . VITAMIN B12 DEFICIENCY 03/18/2009     Assessment: 46 YOF admitted with SOB for past 2 weeks. PMH DVT and PE (2015).  On Xarelto  daily PTA- last dose over a month ago .  HGB 11.3, HCT 34.5, PLt 171. No reported bleeding. CT angio: right heart strain, multifocal pulmonary emboli.   Goal of Therapy:  Heparin level 0.3-0.7 units/ml Monitor platelets by anticoagulation protocol: Yes   Plan:  Give 5500 units bolus x 1 Start heparin infusion at 1600 units/hr 6 hour heparin level, daily heparin level and CBC Monitor s/sx of bleeding.   Emeline General, PharmD Candidate 10/17/2016,2:43 PM

## 2016-10-17 NOTE — ED Notes (Signed)
Report given to [facility] RN charge nurse

## 2016-10-17 NOTE — Progress Notes (Signed)
Pharmacy - IV heparin  Assessment:    Please see note from Emeline General, PharmD candidate earlier today for full details.  Briefly, 46 y.o. female on IV heparin for bilateral PE with RH strain.   Most recent heparin level borderline high on 1600 units/hr  Level drawn appropriately, no bleeding or line issues per RN  Plan:   Reduce heparin to 1450 units/hr  Recheck level with AM labs (~7 hrs after rate change)  Bernadene Person, PharmD, BCPS Pager: [phone] 10/17/2016, 9:59 PM

## 2016-10-17 NOTE — ED Notes (Signed)
Report given to [facility] 

## 2016-10-17 NOTE — ED Notes (Signed)
[facility] at bedside. Spoke with Clista Bernhardt, RN (house coverage) to confirm pt has a ready bed. He confirms transfer to [facility] at this time

## 2016-10-17 NOTE — ED Notes (Signed)
ED Provider at bedside. 

## 2016-10-17 NOTE — H&P (Signed)
History and Physical    Hannah Thompson WUJ:[id number] DOB: 10-29-1970 DOA: 10/17/2016  PCP: Corwin Levins, MD   Patient coming from: [facility].  I have personally briefly reviewed patient's old medical records in [facility]  Chief Complaint: Shortness of breath.  HPI: Hannah Thompson is a 46 y.o. female with medical history significant of allergic rhinitis, anemia, asthma, lumbar disc disease, eczema, headache, hyperlipidemia, hypertension, keloids, vitamin B12 deficiency, lower extremity DVT, pulmonary embolism which has being off Xarelto 10 mg by mouth daily maintenance dose for about a month due to inability to pay for the medication and is coming to the emergency department with complaints of shortness of breath since last week and chest pressure this morning, non-radiating, exacerbated by exertion and relieved by rest, without diaphoresis, dizziness, palpitations, nausea, emesis, PND, orthopnea or recent pitting edema of the lower extremities. She denies fever, chills, sore throat, but complains of a dry cough. No recent wheezing or hemoptysis. Denies abdominal pain, diarrhea, constipation, melena or hematochezia. No dysuria, frequency or hematuria. No blurred vision, polyuria or polydipsia.  ED Course: initial vital signs temperature 90.57F, pulse 103, respirations 20, blood pressure 130/96 mmHg and O2 sat 98% on room air.  Her workup shows WBC of 5.6, hemoglobin 11.3 g/dL and platelet count 956. Her potassium was 3.4 mmol/L and glucose 113 mg/dL, the rest of the BMP values are within normal limits. BNP and troponin were normal. EKG shows borderline prolonged PR interval, abnormal R-wave progression, early transition, LVH voltage criteria and borderline prolonged QT interval. No significant change when compared to previous.  Imaging: chest radiograph did not show any acute abnormality. CT chest angiogram was positive for acute PE with CT evidence of right heart strain (RV/LV Ratio = 1.35)  consistent with at least submassive (intermediate risk) PE. These results were discussed by Dr. Adela Lank with PCCM who only recommended heparin infusion and no lysis of thrombus.  Review of Systems: As per HPI otherwise 10 point review of systems negative.    Past Medical History:  Diagnosis Date  . ALLERGIC RHINITIS 03/17/2009  . ANEMIA-NOS 03/17/2009  . ASTHMA 03/17/2009  . DISC DISEASE, LUMBAR 03/17/2009   mild facet arthropathy by MRI 2005  . DVT (deep venous thrombosis) (HCC)   . ECZEMA 03/17/2009  . Headache(784.0) 06/21/2009   recurrent  . HYPERLIPIDEMIA 03/17/2009  . Hypertension   . KELOID 06/21/2009  . Pulmonary emboli (HCC)   . VITAMIN B12 DEFICIENCY 03/18/2009    Past Surgical History:  Procedure Laterality Date  . KELOID EXCISION  mid 1990's   s/p keloid removal-laser      reports that she quit smoking about 8 years ago. Her smoking use included Cigarettes. She started smoking about 12 years ago. She has a 1.00 pack-year smoking history. She has never used smokeless tobacco. She reports that she drinks alcohol. She reports that she does not use drugs.  Allergies  Allergen Reactions  . Oxaprozin     angioedema  . Sulfonamide Derivatives     REACTION: hives  . Orange Juice [Orange Oil] Hives    Orange juice  . Tomato Hives    tomatoes  . Latex Rash  . Nsaids Rash    Family History  Problem Relation Age of Onset  . Cancer Father        Prostate Cancer  . Hypertension Father   . Allergies Sister   . Cancer Paternal Aunt        Ovarian Cancer  . Cancer  Other        Grandparent-Lung Cancer  . Diabetes Other        Grandparent  . Heart disease Other        Grandparent  . Alcohol abuse Other        Several on both sides of family-3 uncles  . Hypertension Mother   . Diabetes Mother   . Deep vein thrombosis Sister     Prior to Admission medications   Medication Sig Start Date End Date Taking? Authorizing Provider  cetirizine (ZYRTEC) 10 MG tablet Take 1  tablet (10 mg total) by mouth daily. 04/28/14  Yes Corwin Levins, MD  EPINEPHrine (EPI-PEN) 0.3 mg/0.3 mL DEVI Inject 0.3 mLs (0.3 mg total) into the muscle daily as needed (Anaphylaxis). 05/27/15  Yes Corwin Levins, MD  hydrochlorothiazide (MICROZIDE) 12.5 MG capsule Take 1 capsule (12.5 mg total) by mouth daily. 09/20/15  Yes Corwin Levins, MD  rivaroxaban (XARELTO) 20 MG TABS tablet Take 1 tablet (20 mg total) by mouth daily with supper. 12/07/15  Yes Ennever, Rose Phi, MD  traMADol (ULTRAM) 50 MG tablet Take 1 tablet (50 mg total) by mouth every 6 (six) hours as needed. 04/27/15  Yes Corwin Levins, MD  acetaminophen (TYLENOL) 325 MG tablet Take 2 tablets (650 mg total) by mouth every 6 (six) hours as needed for mild pain, moderate pain or headache (or Fever >/= 101). Patient not taking: Reported on 10/17/2016 09/08/13   Elease Etienne, MD  triamcinolone (NASACORT AQ) 55 MCG/ACT AERO nasal inhaler Place 2 sprays into the nose daily. Patient not taking: Reported on 10/17/2016 04/28/14   Corwin Levins, MD    Physical Exam: Vitals:   10/17/16 1444 10/17/16 1500 10/17/16 1545 10/17/16 1746  BP:  123/83 109/75 (!) 146/104  Pulse:  84 91 69  Resp:  (!) 23 (!) 22 (!) 23  Temp:    98.4 F (36.9 C)  TempSrc:      SpO2: 96% 96% 94% 97%  Weight:    125.2 kg (276 lb 0.3 oz)  Height:     (1.702 m)    Constitutional: NAD, calm, comfortable Eyes: PERRL, lids and conjunctivae normal ENMT: Mucous membranes are moist. Posterior pharynx clear of any exudate or lesions. Neck: normal, supple, no masses, no thyromegaly Respiratory: clear to auscultation bilaterally, no wheezing, no crackles. Normal respiratory effort. No accessory muscle use.  Cardiovascular: Regular rate and rhythm, no murmurs / rubs / gallops. No extremity edema. 2+ pedal pulses. No carotid bruits.  Abdomen: Soft, no tenderness, no masses palpated. No hepatosplenomegaly. Bowel sounds positive.  Musculoskeletal: no clubbing / cyanosis.  Good ROM, no contractures. Normal muscle tone.  Skin: no rashes, lesions, ulcers on limited skin exam. Neurologic: CN 2-12 grossly intact. Sensation intact, DTR normal. Strength 5/5 in all 4.  Psychiatric: Normal judgment and insight. Alert and oriented x 4. Normal mood.    Labs on Admission: I have personally reviewed following labs and imaging studies  CBC:  Recent Labs Lab 10/17/16 1344  WBC 5.6  NEUTROABS 2.6  HGB 11.3*  HCT 34.5*  MCV 83.9  PLT 171   Basic Metabolic Panel:  Recent Labs Lab 10/17/16 1344  NA 137  K 3.4*  CL 106  CO2 26  GLUCOSE 113*  BUN 10  CREATININE 0.74  CALCIUM 9.0   GFR: Estimated Creatinine Clearance: 122 mL/min (by C-G formula based on SCr of 0.74 mg/dL). Liver Function Tests: No results for input(s): AST,  ALT, ALKPHOS, BILITOT, PROT, ALBUMIN in the last 168 hours. No results for input(s): LIPASE, AMYLASE in the last 168 hours. No results for input(s): AMMONIA in the last 168 hours. Coagulation Profile: No results for input(s): INR, PROTIME in the last 168 hours. Cardiac Enzymes:  Recent Labs Lab 10/17/16 1344  TROPONINI <0.03   BNP (last 3 results) No results for input(s): PROBNP in the last 8760 hours. HbA1C: No results for input(s): HGBA1C in the last 72 hours. CBG: No results for input(s): GLUCAP in the last 168 hours. Lipid Profile: No results for input(s): CHOL, HDL, LDLCALC, TRIG, CHOLHDL, LDLDIRECT in the last 72 hours. Thyroid Function Tests: No results for input(s): TSH, T4TOTAL, FREET4, T3FREE, THYROIDAB in the last 72 hours. Anemia Panel: No results for input(s): VITAMINB12, FOLATE, FERRITIN, TIBC, IRON, RETICCTPCT in the last 72 hours. Urine analysis:    Component Value Date/Time   COLORURINE YELLOW 09/04/2013 1028   APPEARANCEUR CLEAR 09/04/2013 1028   LABSPEC 1.007 09/04/2013 1028   PHURINE 7.0 09/04/2013 1028   GLUCOSEU NEGATIVE 09/04/2013 1028   GLUCOSEU NEGATIVE 06/16/2010 1659   HGBUR NEGATIVE  09/04/2013 1028   BILIRUBINUR NEGATIVE 09/04/2013 1028   KETONESUR NEGATIVE 09/04/2013 1028   PROTEINUR NEGATIVE 09/04/2013 1028   UROBILINOGEN 0.2 09/04/2013 1028   NITRITE NEGATIVE 09/04/2013 1028   LEUKOCYTESUR NEGATIVE 09/04/2013 1028    Radiological Exams on Admission: Dg Chest 2 View  Result Date: 10/17/2016 CLINICAL DATA:  Shortness of breath x4 days EXAM: CHEST  2 VIEW COMPARISON:  CT chest dated 12/06/2015 FINDINGS: Lungs are clear.  No pleural effusion or pneumothorax. The heart is normal in size. Visualized osseous structures are within normal limits. IMPRESSION: Normal chest radiographs. Electronically Signed   By: Charline Bills M.D.   On: 10/17/2016 14:07   Ct Angio Chest Pe W And/or Wo Contrast  Result Date: 10/17/2016 CLINICAL DATA:  Shortness of breath for 2 weeks EXAM: CT ANGIOGRAPHY CHEST WITH CONTRAST TECHNIQUE: Multidetector CT imaging of the chest was performed using the standard protocol during bolus administration of intravenous contrast. Multiplanar CT image reconstructions and MIPs were obtained to evaluate the vascular anatomy. CONTRAST:  100 mL Isovue 370. COMPARISON:  None. FINDINGS: Cardiovascular: Thoracic aorta demonstrates a normal branching pattern. No significant atherosclerotic calcifications are noted. No dissection or aneurysmal dilatation is noted. The heart is mildly enlarged in size. There is enlargement of the right ventricle. The pulmonary artery shows a normal branching pattern with multiple bilateral diffuse filling defects consistent with multifocal pulmonary emboli. The RV/ LV ratio is 1.35 consistent with a degree of right heart strain. No coronary calcifications are noted. Mediastinum/Nodes: The thoracic inlet is within normal limits. No significant hilar or mediastinal adenopathy is noted. The esophagus is within normal limits. Lungs/Pleura: The lungs are well aerated bilaterally without focal infiltrate or sizable effusion. No definitive  parenchymal nodules are seen. No pneumothorax is noted. Upper Abdomen: The visualized upper abdomen is unremarkable. Musculoskeletal: The osseous structures show no acute bony abnormality. Mild degenerative change of the thoracic spine is seen. Review of the MIP images confirms the above findings. IMPRESSION: Positive for acute PE with CT evidence of right heart strain (RV/LV Ratio = 1.35) consistent with at least submassive (intermediate risk) PE. The presence of right heart strain has been associated with an increased risk of morbidity and mortality. Please activate Code PE by paging [phone]. Critical Value/emergent results were called by telephone at the time of interpretation on 10/17/2016 at 2:28 pm to Dr. Melene Plan ,  who verbally acknowledged these results. Electronically Signed   By: Alcide Clever M.D.   On: 10/17/2016 14:28    EKG: Independently reviewed. Vent. rate 93 BPM PR interval * ms QRS duration 90 ms QT/QTc 401/499 ms P-R-T axes 49 13 0 Sinus rhythm Borderline prolonged PR interval Abnormal R-wave progression, early transition LVH by voltage Nonspecific T abnormalities, anterior leads Borderline prolonged QT interval No significant change since previous.  Assessment/Plan Principal Problem:   Pulmonary embolism (HCC) Admit to stepdown/inpatient. Continue supplemental oxygen. Continue heparin infusion. Trend troponin level. Check echocardiogram in a.m. Check lower extremities Doppler in the morning. Care management evaluation of medication needs.  Active Problems:   ANEMIA-NOS Normal iron studies in August 2017. Has not taking B12 supplementation in some time, but normal MCV. Monitor hematocrit and hemoglobin.    Asthma Continue supplemental oxygen. Bronchodilators as needed.    Hypertension Hold hydrochlorothiazide. Monitor blood pressure and electrolytes.    Hypokalemia Replacing. Follow potassium level.     Hyperlipidemia Currently not on medical  therapy.    DVT prophylaxis: On heparin infusion. Code Status: Full code. Family Communication:  Disposition Plan: Admit for 2-3 days for heparin infusion and oral AC bridging. Consults called: PCCM was consulted by Dr. Adela Lank and recommended continuing [facility]. Admission status: Inpatient/SDU.   Bobette Mo MD [facility] Pager [phone].  If 7PM-7AM, please contact night-coverage [email or web address] Password [email or web address]  10/17/2016, 6:40 PM

## 2016-10-18 ENCOUNTER — Inpatient Hospital Stay (HOSPITAL_COMMUNITY): Payer: BLUE CROSS/BLUE SHIELD

## 2016-10-18 DIAGNOSIS — I5030 Unspecified diastolic (congestive) heart failure: Secondary | ICD-10-CM

## 2016-10-18 DIAGNOSIS — I82409 Acute embolism and thrombosis of unspecified deep veins of unspecified lower extremity: Secondary | ICD-10-CM

## 2016-10-18 DIAGNOSIS — Z86711 Personal history of pulmonary embolism: Secondary | ICD-10-CM

## 2016-10-18 LAB — COMPREHENSIVE METABOLIC PANEL
ALBUMIN: 3.3 g/dL — AB (ref 3.5–5.0)
ALK PHOS: 62 U/L (ref 38–126)
ALT: 18 U/L (ref 14–54)
AST: 19 U/L (ref 15–41)
Anion gap: 6 (ref 5–15)
BILIRUBIN TOTAL: 0.1 mg/dL — AB (ref 0.3–1.2)
BUN: 12 mg/dL (ref 6–20)
CALCIUM: 8.7 mg/dL — AB (ref 8.9–10.3)
CO2: 23 mmol/L (ref 22–32)
Chloride: 112 mmol/L — ABNORMAL HIGH (ref 101–111)
Creatinine, Ser: 0.65 mg/dL (ref 0.44–1.00)
GFR calc Af Amer: >60 mL/min (ref >60)
GFR calc non Af Amer: >60 mL/min (ref >60)
GLUCOSE: 110 mg/dL — AB (ref 65–99)
Potassium: 4.1 mmol/L (ref 3.5–5.1)
Sodium: 141 mmol/L (ref 135–145)
TOTAL PROTEIN: 6.7 g/dL (ref 6.5–8.1)

## 2016-10-18 LAB — ECHOCARDIOGRAM COMPLETE
Height: 67 in
Weight: 4416.25 oz

## 2016-10-18 LAB — CBC
HEMATOCRIT: 32.4 % — AB (ref 36.0–46.0)
Hemoglobin: 10.6 g/dL — ABNORMAL LOW (ref 12.0–15.0)
MCH: 27.5 pg (ref 26.0–34.0)
MCHC: 32.7 g/dL (ref 30.0–36.0)
MCV: 83.9 fL (ref 78.0–100.0)
Platelets: 141 10*3/uL — ABNORMAL LOW (ref 150–400)
RBC: 3.86 MIL/uL — ABNORMAL LOW (ref 3.87–5.11)
RDW: 14.8 % (ref 11.5–15.5)
WBC: 5 10*3/uL (ref 4.0–10.5)

## 2016-10-18 LAB — HEPARIN LEVEL (UNFRACTIONATED): Heparin Unfractionated: 0.76 IU/mL — ABNORMAL HIGH (ref 0.30–0.70)

## 2016-10-18 LAB — TROPONIN I: Troponin I: <0.03 ng/mL (ref <0.03)

## 2016-10-18 MED ORDER — RIVAROXABAN 15 MG PO TABS
15.0000 mg | ORAL_TABLET | Freq: Two times a day (BID) | ORAL | Status: DC
  Administered 2016-10-18 – 2016-10-19 (×3): 15 mg via ORAL
  Filled 2016-10-18 (×3): qty 1

## 2016-10-18 MED ORDER — HEPARIN (PORCINE) IN NACL 100-0.45 UNIT/ML-% IJ SOLN: 1350.0000 [IU]/h | INTRAMUSCULAR | Status: AC

## 2016-10-18 MED ORDER — LORATADINE 10 MG PO TABS
10.0000 mg | ORAL_TABLET | Freq: Every day | ORAL | Status: DC
  Administered 2016-10-18 – 2016-10-19 (×2): 10 mg via ORAL
  Filled 2016-10-18 (×2): qty 1

## 2016-10-18 MED ORDER — HYDROCHLOROTHIAZIDE 12.5 MG PO CAPS
12.5000 mg | ORAL_CAPSULE | Freq: Every day | ORAL | Status: DC
  Administered 2016-10-18 – 2016-10-19 (×2): 12.5 mg via ORAL
  Filled 2016-10-18 (×2): qty 1

## 2016-10-18 MED ORDER — TRAMADOL HCL 50 MG PO TABS
50.0000 mg | ORAL_TABLET | Freq: Four times a day (QID) | ORAL | Status: DC | PRN
  Administered 2016-10-18 – 2016-10-19 (×3): 50 mg via ORAL
  Filled 2016-10-18 (×3): qty 1

## 2016-10-18 NOTE — Progress Notes (Addendum)
ANTICOAGULATION CONSULT NOTE  Pharmacy Consult for heparin dosing Indication: pulmonary embolus  Allergies  Allergen Reactions  . Oxaprozin     angioedema  . Sulfonamide Derivatives     REACTION: hives  . Orange Juice [Orange Oil] Hives    Orange juice  . Tomato Hives    tomatoes  . Latex Rash  . Nsaids Rash    Patient Measurements: Height: 5\' 7"  (170.2 cm) Weight: 276 lb 0.3 oz (125.2 kg) IBW/kg (Calculated) : 61.6 Heparin Dosing Weight: 91.3 kg  Vital Signs: Temp: 97.9 F (36.6 C) (10/16 2317) Temp Source: Oral (10/16 2317) BP: 100/56 (10/17 0600) Pulse Rate: 59 (10/17 0600)  Labs:  Recent Labs  10/17/16 1344 10/17/16 1948 10/17/16 2037 10/18/16 0155 10/18/16 0654  HGB 11.3*  --   --   --  10.6*  HCT 34.5*  --   --   --  32.4*  PLT 171  --   --   --  141*  HEPARINUNFRC  --   --  0.70  --  0.76*  CREATININE 0.74  --   --   --  0.65  TROPONINI <0.03 <0.03  --  <0.03  --     Estimated Creatinine Clearance: 122 mL/min (by C-G formula based on SCr of 0.65 mg/dL).   Medical History: Past Medical History:  Diagnosis Date  . ALLERGIC RHINITIS 03/17/2009  . ANEMIA-NOS 03/17/2009  . ASTHMA 03/17/2009  . DISC DISEASE, LUMBAR 03/17/2009   mild facet arthropathy by MRI 2005  . DVT (deep venous thrombosis) (HCC)   . ECZEMA 03/17/2009  . Headache(784.0) 06/21/2009   recurrent  . HYPERLIPIDEMIA 03/17/2009  . Hypertension   . KELOID 06/21/2009  . Pulmonary emboli (HCC)   . VITAMIN B12 DEFICIENCY 03/18/2009     Assessment: 46 YOF admitted with SOB for past 2 weeks. PMH DVT and PE (2015).  On Xarelto 20mg  daily PTA- last dose over a month ago. CT angio: right heart strain, multifocal pulmonary emboli.  Pharmacy asked to dose heparin gtt.   Discussed with patient anticoagulant use.  Started on warfarin in 2015 for acute VTE (insurance would not pay for xarelto at that time), Hem/Onc stopped in 2017 since completed 2y of therapy.  Patient developed DVT and placed on  xarelto as insurance would pay.  In June hem/onc reduced xarelto to preventative dose of 10mg  daily. She went to get refill in sept and pharmacy said she was in a "donut hole."  She had not had time to follow-up with insurance.  She is going to call today.   Today, 10/18/2016  Heparin level SUPRAtherapeutic despite rate decrease last evening  CBC: Hgb decreased but relatively stable, pltc slightly low (baseline 171)  Rivaroxaban dose last taken a month ago per patient report, so would not have effect on anti-Xa level at this point.  No coagulation baseline labs (ie. Anti-Xa, aPTT, INR).  Check aPTT with next lab draw to evaluate correlation.   Goal of Therapy:  Heparin level 0.3-0.7 units/ml Monitor platelets by anticoagulation protocol: Yes   Plan:   Reduce heparin to 1350 units/hr  Check 6h heparin level, aPTT  Daily heparin level and CBC  Juliette Alcideustin Damaris Abeln, PharmD, BCPS.   Pager: [phone] 10/18/2016 7:50 AM

## 2016-10-18 NOTE — Progress Notes (Signed)
  Echocardiogram 2D Echocardiogram has been performed.  Pieter PartridgeBrooke S  10/18/2016, 9:14 AM

## 2016-10-18 NOTE — Progress Notes (Signed)
VASCULAR LAB PRELIMINARY  PRELIMINARY  PRELIMINARY  PRELIMINARY  Bilateral lower extremity venous duplex completed.    Preliminary report:  Bilateral:  No evidence of DVT, superficial thrombosis, or Baker's Cyst.   , , RVS 10/18/2016, 9:06 AM

## 2016-10-18 NOTE — Discharge Instructions (Signed)
Information on my medicine - XARELTO (rivaroxaban)  This medication education was reviewed with me or my healthcare representative as part of my discharge preparation.  The pharmacist that spoke with me during my hospital stay was:  Ulyses Amorustin Z. PharmD  WHY WAS XARELTO PRESCRIBED FOR YOU? Xarelto was prescribed to treat blood clots that may have been found in the veins of your legs (deep vein thrombosis) or in your lungs (pulmonary embolism) and to reduce the risk of them occurring again.  What do you need to know about Xarelto? The starting dose is one 15 mg tablet taken TWICE daily with food for the FIRST 21 DAYS then on 11/09/2016 the dose is changed to one 20 mg tablet taken ONCE A DAY with your evening meal.  DO NOT stop taking Xarelto without talking to the health care provider who prescribed the medication.  Refill your prescription for 20 mg tablets before you run out.  After discharge, you should have regular check-up appointments with your healthcare provider that is prescribing your Xarelto.  In the future your dose may need to be changed if your kidney function changes by a significant amount.  What do you do if you miss a dose? If you are taking Xarelto TWICE DAILY and you miss a dose, take it as soon as you remember. You may take two 15 mg tablets (total 30 mg) at the same time then resume your regularly scheduled 15 mg twice daily the next day.  If you are taking Xarelto ONCE DAILY and you miss a dose, take it as soon as you remember on the same day then continue your regularly scheduled once daily regimen the next day. Do not take two doses of Xarelto at the same time.   Important Safety Information Xarelto is a blood thinner medicine that can cause bleeding. You should call your healthcare provider right away if you experience any of the following: ? Bleeding from an injury or your nose that does not stop. ? Unusual colored urine (red or dark brown) or unusual colored  stools (red or black). ? Unusual bruising for unknown reasons. ? A serious fall or if you hit your head (even if there is no bleeding).  Some medicines may interact with Xarelto and might increase your risk of bleeding while on Xarelto. To help avoid this, consult your healthcare provider or pharmacist prior to using any new prescription or non-prescription medications, including herbals, vitamins, non-steroidal anti-inflammatory drugs (NSAIDs) and supplements.  This website has more information on Xarelto: [email or web address].xarelto.[email or web address].

## 2016-10-18 NOTE — Progress Notes (Signed)
PROGRESS NOTE    Hannah LightsWanda R Jewkes  ZOX:[id number] DOB: [birth year] DOA: 10/17/2016 PCP: Corwin LevinsJohn, James W, MD   Brief Narrative:  Hannah Thompson is a [age] y.o. female with medical history significant of allergic rhinitis, anemia, asthma, lumbar disc disease, eczema, headache, hyperlipidemia, hypertension, keloids, vitamin B12 deficiency, lower extremity DVT, pulmonary embolism which has being off Xarelto 10 mg by mouth daily maintenance dose for about a month due to inability to pay for the medication and came to the emergency department with complaints of shortness of breath since last week and chest pressure this morning, non-radiating, exacerbated by exertion and relieved by rest, without diaphoresis, dizziness, palpitations, nausea, emesis, PND, orthopnea or recent pitting edema of the lower extremities. She was noted to have a submassive PE for which she was started on a heparin drip and transferred to stepdown unit for further management.   Her vital signs are remained stable and she is currently on room air with good oxygenation noted. No acute overnight events have been noted since admission. She is only complaining of some mild substernal chest pressure.  Assessment & Plan:   Principal Problem:   Pulmonary embolism (HCC) Active Problems:   Hyperlipidemia   ANEMIA-NOS   Asthma   Hypertension   Hypokalemia     Pulmonary embolism (HCC) Continue to trend troponins 2-D echocardiogram pending Transfer to floor with telemetry Discussed with pharmacy discontinuing heparin drip and transition to Xarelto  Active Problems:   ANEMIA-NOS Normal iron studies in August 2017. Has not taking B12 supplementation in some time, but normal MCV. Monitor hematocrit and hemoglobin-stable    Asthma Continue supplemental oxygen. Bronchodilators as needed.    Hypertension Restart home HCTZ    Hypokalemia-replaced     Hyperlipidemia No need for statin at this time    DVT prophylaxis:  On heparin infusion to be started on Xarelto 15mg  BID Code Status: Full code. Family Communication: None; no family at bedside Disposition Plan: Start Xarelto today; DC heparin bridging as discussed with pharmacy and move to floor. Anticipate DC in AM Consults called: PCCM was consulted by Dr[facility]oyd [facility] Admission status: Inpatient/SDU->floor with tele   Consultants:   None  Procedures: None   Antimicrobials: None  Objective: Vitals:   10/18/16 0200 10/18/16 0400 10/18/16 0600 10/18/16 0800  BP: 135/86 123/67 (!) 100/56 (!) 133/115  Pulse: 60 67 (!) 59   Resp:  17 16   Temp:    98.2 F (36.8 C)  TempSrc:    Oral  SpO2: 97% 96% 96%   Weight:      Height:        Intake/Output Summary (Last 24 hours) at 10/18/16 1148 Last data filed at 10/18/16 0800  Gross per 24 hour  Intake           188.29 ml  Output              950 ml  Net          -761.71 ml   Filed Weights   10/17/16 1339 10/17/16 1746  Weight: 124.7 kg (275 lb) 125.2 kg (276 lb 0.3 oz)    Examination:  General exam: Appears calm and comfortable  Respiratory system: Clear to auscultation. Respiratory effort normal on RA Cardiovascular system: S1 & S2 heard, RRR. No JVD, murmurs, rubs, gallops or clicks. No pedal edema. Gastrointestinal system: Abdomen is nondistended, soft and nontender. No organomegaly or masses felt. Normal bowel sounds heard. Central nervous system: Alert and  oriented. No focal neurological deficits. Extremities: Symmetric 5 x 5 power. Skin: No rashes, lesions or ulcers Psychiatry: Judgement and insight appear normal. Mood & affect appropriate.    Data Reviewed: I have personally reviewed following labs and imaging studies  CBC:  Recent Labs Lab 10/17/16 1344 10/18/16 0654  WBC 5.6 5.0  NEUTROABS 2.6  --   HGB 11.3* 10.6*  HCT 34.5* 32.4*  MCV 83.9 83.9  PLT 171 141*   Basic Metabolic Panel:  Recent Labs Lab 10/17/16 1344 10/17/16 1948  10/18/16 0654  NA 137  --  141  K 3.4*  --  4.1  CL 106  --  112*  CO2 26  --  23  GLUCOSE 113*  --  110*  BUN 10  --  12  CREATININE 0.74  --  0.65  CALCIUM 9.0  --  8.7*  MG  --  1.8  --    GFR: Estimated Creatinine Clearance: 122 mL/min (by C-G formula based on SCr of 0.65 mg/dL). Liver Function Tests:  Recent Labs Lab 10/18/16 0654  AST 19  ALT 18  ALKPHOS 62  BILITOT 0.1*  PROT 6.7  ALBUMIN 3.3*   No results for input(s): LIPASE, AMYLASE in the last 168 hours. No results for input(s): AMMONIA in the last 168 hours. Coagulation Profile: No results for input(s): INR, PROTIME in the last 168 hours. Cardiac Enzymes:  Recent Labs Lab 10/17/16 1344 10/17/16 1948 10/18/16 0155  TROPONINI <0.03 <0.03 <0.03   BNP (last 3 results) No results for input(s): PROBNP in the last 8760 hours. HbA1C: No results for input(s): HGBA1C in the last 72 hours. CBG: No results for input(s): GLUCAP in the last 168 hours. Lipid Profile: No results for input(s): CHOL, HDL, LDLCALC, TRIG, CHOLHDL, LDLDIRECT in the last 72 hours. Thyroid Function Tests: No results for input(s): TSH, T4TOTAL, FREET4, T3FREE, THYROIDAB in the last 72 hours. Anemia Panel: No results for input(s): VITAMINB12, FOLATE, FERRITIN, TIBC, IRON, RETICCTPCT in the last 72 hours. Sepsis Labs: No results for input(s): PROCALCITON, LATICACIDVEN in the last 168 hours.  Recent Results (from the past 240 hour(s))  MRSA PCR Screening     Status: None   Collection Time: 10/17/16  5:40 PM  Result Value Ref Range Status   MRSA by PCR NEGATIVE NEGATIVE Final    Comment:        The GeneXpert MRSA Assay (FDA approved for NASAL specimens only), is one component of a comprehensive MRSA colonization surveillance program. It is not intended to diagnose MRSA infection nor to guide or monitor treatment for MRSA infections.       Radiology Studies: Dg Chest 2 View  Result Date: 10/17/2016 CLINICAL DATA:  Shortness  of breath x4 days EXAM: CHEST  2 VIEW COMPARISON:  CT chest dated 12/06/2015 FINDINGS: Lungs are clear.  No pleural effusion or pneumothorax. The heart is normal in size. Visualized osseous structures are within normal limits. IMPRESSION: Normal chest radiographs. Electronically Signed   By: Charline Bills M.D.   On: 10/17/2016 14:07   Ct Angio Chest Pe W And/or Wo Contrast  Result Date: 10/17/2016 CLINICAL DATA:  Shortness of breath for 2 weeks EXAM: CT ANGIOGRAPHY CHEST WITH CONTRAST TECHNIQUE: Multidetector CT imaging of the chest was performed using the standard protocol during bolus administration of intravenous contrast. Multiplanar CT image reconstructions and MIPs were obtained to evaluate the vascular anatomy. CONTRAST:  100 mL Isovue 370. COMPARISON:  None. FINDINGS: Cardiovascular: Thoracic aorta demonstrates a normal branching  pattern. No significant atherosclerotic calcifications are noted. No dissection or aneurysmal dilatation is noted. The heart is mildly enlarged in size. There is enlargement of the right ventricle. The pulmonary artery shows a normal branching pattern with multiple bilateral diffuse filling defects consistent with multifocal pulmonary emboli. The RV/ LV ratio is 1.35 consistent with a degree of right heart strain. No coronary calcifications are noted. Mediastinum/Nodes: The thoracic inlet is within normal limits. No significant hilar or mediastinal adenopathy is noted. The esophagus is within normal limits. Lungs/Pleura: The lungs are well aerated bilaterally without focal infiltrate or sizable effusion. No definitive parenchymal nodules are seen. No pneumothorax is noted. Upper Abdomen: The visualized upper abdomen is unremarkable. Musculoskeletal: The osseous structures show no acute bony abnormality. Mild degenerative change of the thoracic spine is seen. Review of the MIP images confirms the above findings. IMPRESSION: Positive for acute PE with CT evidence of right  heart strain (RV/LV Ratio = 1.35) consistent with at least submassive (intermediate risk) PE. The presence of right heart strain has been associated with an increased risk of morbidity and mortality. Please activate Code PE by paging [phone]. Critical Value/emergent results were called by telephone at the time of interpretation on 10/17/2016 at 2:28 pm to Dr. Melene Plan , who verbally acknowledged these results. Electronically Signed   By: Alcide Clever M.D.   On: 10/17/2016 14:28     Scheduled Meds: . loratadine  10 mg Oral Daily  . Rivaroxaban  15 mg Oral BID WC   Continuous Infusions: . heparin 1,350 Units/hr (10/18/16 0900)     LOS: 1 day    Time spent: 30 minutes    Hoover Brunette, DO [facility] Pager [phone]  If 7PM-7AM, please contact night-coverage [email or web address] Password [email or web address] 10/18/2016, 11:48 AM

## 2016-10-18 NOTE — Care Management Note (Signed)
Case Management Note  Patient Details  Name: Hannah Thompson MRN: [id number] Date of Birth: 02/14/1970  Subjective/Objective:                  Acute p.e.  Action/Plan: Date:  October 18, 2016 Chart reviewed for concurrent status and case management needs.  Will continue to follow patient progress.  Discharge Planning: following for needs  Expected discharge date: [id number]  Marcelle SmilingRhonda Davis, BSN, [place]RN3, [place]CCM   [phone]   Expected Discharge Date:                  Expected Discharge Plan:  Home/Self Care  In-House Referral:     Discharge planning Services  CM Consult  Post Acute Care Choice:    Choice offered to:     DME Arranged:    DME Agency:     HH Arranged:    HH Agency:     Status of Service:  In process, will continue to follow  If discussed at Long Length of Stay Meetings, dates discussed:    Additional Comments:  Golda AcreDavis, Rhonda Lynn, RN 10/18/2016, 9:42 AM

## 2016-10-19 LAB — CBC
HEMATOCRIT: 35.8 % — AB (ref 36.0–46.0)
Hemoglobin: 11.7 g/dL — ABNORMAL LOW (ref 12.0–15.0)
MCH: 27.2 pg (ref 26.0–34.0)
MCHC: 32.7 g/dL (ref 30.0–36.0)
MCV: 83.3 fL (ref 78.0–100.0)
PLATELETS: 178 10*3/uL (ref 150–400)
RBC: 4.3 MIL/uL (ref 3.87–5.11)
RDW: 14.4 % (ref 11.5–15.5)
WBC: 5.3 10*3/uL (ref 4.0–10.5)

## 2016-10-19 LAB — BASIC METABOLIC PANEL
ANION GAP: 10 (ref 5–15)
BUN: 9 mg/dL (ref 6–20)
CALCIUM: 9.1 mg/dL (ref 8.9–10.3)
CO2: 23 mmol/L (ref 22–32)
Chloride: 104 mmol/L (ref 101–111)
Creatinine, Ser: 0.66 mg/dL (ref 0.44–1.00)
GLUCOSE: 107 mg/dL — AB (ref 65–99)
POTASSIUM: 4.1 mmol/L (ref 3.5–5.1)
Sodium: 137 mmol/L (ref 135–145)

## 2016-10-19 MED ORDER — RIVAROXABAN 15 MG PO TABS: 15.0000 mg | ORAL_TABLET | Freq: Two times a day (BID) | ORAL | 0 refills | Status: DC

## 2016-10-19 MED ORDER — RIVAROXABAN 20 MG PO TABS: 20.0000 mg | ORAL_TABLET | Freq: Every day | ORAL | 3 refills | Status: DC

## 2016-10-19 NOTE — Progress Notes (Signed)
Pt given discharge instruction including medication teaching. Xarelto teaching gone over with Pt. Pt has Xarelto book. Pt verbalized understanding of medication teaching and all other home instructions. Pt at time of discharge given discharge instructions in packet.

## 2016-10-19 NOTE — Discharge Summary (Signed)
Physician Discharge Summary  [name]:[id number] DOB: 19-Dec-1970 DOA: 10/17/2016  PCP: Corwin Levins, MD  Admit date: 10/17/2016 Discharge date: 10/19/2016  Admitted From: Home Disposition:  Home  Recommendations for Outpatient Follow-up:  1. Follow up with PCP in 1-2 weeks as scheduled  Home Health:No Equipment/Devices:None  Discharge Condition:Stable CODE STATUS:Full Diet recommendation: Heart Healthy  Brief/Interim Summary:  Hannah Knoll Wilsonis a 46 y.o.femalewith medical history significant of allergic rhinitis, anemia, asthma, lumbar disc disease, eczema, headache, hyperlipidemia, hypertension, keloids, vitamin B12 deficiency, lower extremity DVT, pulmonary embolism which has being off Xarelto 10 mg by mouth daily maintenance dose for about a month due to inability to pay for the medication and came to the emergency department with complaints of shortness of breath since last week and chest pressure this morning, non-radiating, exacerbated by exertion and relieved by rest, without diaphoresis, dizziness, palpitations, nausea, emesis, PND, orthopnea or recent pitting edema of the lower extremities. She was noted to have a submassive PE for which she was started on a heparin drip and transferred to stepdown unit for further management. She has remained stable throughout the course of her admission and has been transitioned back to treatment dose of Xarelto at 15mg  twice daily for 21 days now and will also restart Xarelto 20mg  daily thereafter for treatment. She has remained on room airduring the course of this admission with stable vital signs and denies any symptomatically complaints or concerns this morning. Case management has worked with her to make sure that she can be supplied this medication and afford it.   Discharge Diagnoses:  Principal Problem:   Pulmonary embolism (HCC) Active Problems:   Hyperlipidemia   ANEMIA-NOS   Asthma   Hypertension    Hypokalemia   Pulmonary embolism (HCC) DC with Xarelto as prescribed; currently asymptomatic 2D echo reviewed and within normal limits at this time No further symptomatology   Active Problems: ANEMIA-NOS Normal iron studies in August 2017. Monitor hematocrit and hemoglobin-stable  Asthma Continue supplemental oxygen. Bronchodilators as needed.  Hypertension Restart home HCTZ  Hypokalemia-replaced   Hyperlipidemia No need for statin at this time  Discharge Instructions  Discharge Instructions    Diet - low sodium heart healthy    Complete by:  As directed    Increase activity slowly    Complete by:  As directed      Allergies as of 10/19/2016      Reactions   Oxaprozin    angioedema   Sulfonamide Derivatives    REACTION: hives   Orange Juice [orange Oil] Hives   Orange juice   Tomato Hives   tomatoes   Latex Rash   Nsaids Rash      Medication List    TAKE these medications   acetaminophen 325 MG tablet Commonly known as:  TYLENOL Take 2 tablets (650 mg total) by mouth every 6 (six) hours as needed for mild pain, moderate pain or headache (or Fever >/= 101).   cetirizine 10 MG tablet Commonly known as:  ZYRTEC Take 1 tablet (10 mg total) by mouth daily.   EPINEPHrine 0.3 mg/0.3 mL Devi Commonly known as:  EPI-PEN Inject 0.3 mLs (0.3 mg total) into the muscle daily as needed (Anaphylaxis).   hydrochlorothiazide 12.5 MG capsule Commonly known as:  MICROZIDE Take 1 capsule (12.5 mg total) by mouth daily.   Rivaroxaban 15 MG Tabs tablet Commonly known as:  XARELTO Take 1 tablet (15 mg total) by mouth 2 (two) times daily with a meal. What  changed:  medication strength  how much to take  when to take this   rivaroxaban 20 MG Tabs tablet Commonly known as:  XARELTO Take 1 tablet (20 mg total) by mouth daily with supper. Start taking on:  11/09/2016 What changed:  You were already taking a medication with the same name, and  this prescription was added. Make sure you understand how and when to take each.   traMADol 50 MG tablet Commonly known as:  ULTRAM Take 1 tablet (50 mg total) by mouth every 6 (six) hours as needed.   triamcinolone 55 MCG/ACT Aero nasal inhaler Commonly known as:  NASACORT AQ Place 2 sprays into the nose daily.      Follow-up Information    Corwin Levins, MD Follow up in 2 week(s).   Specialties:  Internal Medicine, Radiology Contact information: [address] [place] [facility] [place] [address] [phone]          Allergies  Allergen Reactions  . Oxaprozin     angioedema  . Sulfonamide Derivatives     REACTION: hives  . Orange Juice [Orange Oil] Hives    Orange juice  . Tomato Hives    tomatoes  . Latex Rash  . Nsaids Rash    Consultations:  None   Procedures/Studies: Dg Chest 2 View  Result Date: 10/17/2016 CLINICAL DATA:  Shortness of breath x4 days EXAM: CHEST  2 VIEW COMPARISON:  CT chest dated 12/06/2015 FINDINGS: Lungs are clear.  No pleural effusion or pneumothorax. The heart is normal in size. Visualized osseous structures are within normal limits. IMPRESSION: Normal chest radiographs. Electronically Signed   By: Charline Bills M.D.   On: 10/17/2016 14:07   Ct Angio Chest Pe W And/or Wo Contrast  Result Date: 10/17/2016 CLINICAL DATA:  Shortness of breath for 2 weeks EXAM: CT ANGIOGRAPHY CHEST WITH CONTRAST TECHNIQUE: Multidetector CT imaging of the chest was performed using the standard protocol during bolus administration of intravenous contrast. Multiplanar CT image reconstructions and MIPs were obtained to evaluate the vascular anatomy. CONTRAST:  100 mL Isovue 370. COMPARISON:  None. FINDINGS: Cardiovascular: Thoracic aorta demonstrates a normal branching pattern. No significant atherosclerotic calcifications are noted. No dissection or aneurysmal dilatation is noted. The heart is mildly enlarged in size. There is enlargement of the right ventricle.  The pulmonary artery shows a normal branching pattern with multiple bilateral diffuse filling defects consistent with multifocal pulmonary emboli. The RV/ LV ratio is 1.35 consistent with a degree of right heart strain. No coronary calcifications are noted. Mediastinum/Nodes: The thoracic inlet is within normal limits. No significant hilar or mediastinal adenopathy is noted. The esophagus is within normal limits. Lungs/Pleura: The lungs are well aerated bilaterally without focal infiltrate or sizable effusion. No definitive parenchymal nodules are seen. No pneumothorax is noted. Upper Abdomen: The visualized upper abdomen is unremarkable. Musculoskeletal: The osseous structures show no acute bony abnormality. Mild degenerative change of the thoracic spine is seen. Review of the MIP images confirms the above findings. IMPRESSION: Positive for acute PE with CT evidence of right heart strain (RV/LV Ratio = 1.35) consistent with at least submassive (intermediate risk) PE. The presence of right heart strain has been associated with an increased risk of morbidity and mortality. Please activate Code PE by paging [phone]. Critical Value/emergent results were called by telephone at the time of interpretation on 10/17/2016 at 2:28 pm to Dr. Melene Plan , who verbally acknowledged these results. Electronically Signed   By: Eulah Pont.D.  On: 10/17/2016 14:28   Discharge Exam: Vitals:   10/18/16 1914 10/19/16 0647  BP:  118/83  Pulse:  64  Resp:  18  Temp: 98.4 F (36.9 C) 98 F (36.7 C)  SpO2:  98%   Vitals:   10/18/16 1700 10/18/16 1800 10/18/16 1914 10/19/16 0647  BP:    118/83  Pulse: (!) 55 68  64  Resp: 16 14  18   Temp:   98.4 F (36.9 C) 98 F (36.7 C)  TempSrc:   Oral Oral  SpO2: 99% 92%  98%  Weight:      Height:        General: Pt is alert, awake, not in acute distress Cardiovascular: RRR, S1/S2 +, no rubs, no gallops Respiratory: CTA bilaterally, no wheezing, no  rhonchi Abdominal: Soft, NT, ND, bowel sounds + Extremities: no edema, no cyanosis   The results of significant diagnostics from this hospitalization (including imaging, microbiology, ancillary and laboratory) are listed below for reference.     Microbiology: Recent Results (from the past 240 hour(s))  MRSA PCR Screening     Status: None   Collection Time: 10/17/16  5:40 PM  Result Value Ref Range Status   MRSA by PCR NEGATIVE NEGATIVE Final    Comment:        The GeneXpert MRSA Assay (FDA approved for NASAL specimens only), is one component of a comprehensive MRSA colonization surveillance program. It is not intended to diagnose MRSA infection nor to guide or monitor treatment for MRSA infections.      Labs: BNP (last 3 results)  Recent Labs  10/17/16 1344  BNP 15.5   Basic Metabolic Panel:  Recent Labs Lab 10/17/16 1344 10/17/16 1948 10/18/16 0654 10/19/16 0510  NA 137  --  141 137  K 3.4*  --  4.1 4.1  CL 106  --  112* 104  CO2 26  --  23 23  GLUCOSE 113*  --  110* 107*  BUN 10  --  12 9  CREATININE 0.74  --  0.65 0.66  CALCIUM 9.0  --  8.7* 9.1  MG  --  1.8  --   --    Liver Function Tests:  Recent Labs Lab 10/18/16 0654  AST 19  ALT 18  ALKPHOS 62  BILITOT 0.1*  PROT 6.7  ALBUMIN 3.3*   No results for input(s): LIPASE, AMYLASE in the last 168 hours. No results for input(s): AMMONIA in the last 168 hours. CBC:  Recent Labs Lab 10/17/16 1344 10/18/16 0654 10/19/16 0510  WBC 5.6 5.0 5.3  NEUTROABS 2.6  --   --   HGB 11.3* 10.6* 11.7*  HCT 34.5* 32.4* 35.8*  MCV 83.9 83.9 83.3  PLT 171 141* 178   Cardiac Enzymes:  Recent Labs Lab 10/17/16 1344 10/17/16 1948 10/18/16 0155  TROPONINI <0.03 <0.03 <0.03   BNP: Invalid input(s): POCBNP CBG: No results for input(s): GLUCAP in the last 168 hours. D-Dimer No results for input(s): DDIMER in the last 72 hours. Hgb A1c No results for input(s): HGBA1C in the last 72 hours. Lipid  Profile No results for input(s): CHOL, HDL, LDLCALC, TRIG, CHOLHDL, LDLDIRECT in the last 72 hours. Thyroid function studies No results for input(s): TSH, T4TOTAL, T3FREE, THYROIDAB in the last 72 hours.  Invalid input(s): FREET3 Anemia work up No results for input(s): VITAMINB12, FOLATE, FERRITIN, TIBC, IRON, RETICCTPCT in the last 72 hours. Urinalysis    Component Value Date/Time   COLORURINE YELLOW 09/04/2013 1028  APPEARANCEUR CLEAR 09/04/2013 1028   LABSPEC 1.007 09/04/2013 1028   PHURINE 7.0 09/04/2013 1028   GLUCOSEU NEGATIVE 09/04/2013 1028   GLUCOSEU NEGATIVE 06/16/2010 1659   HGBUR NEGATIVE 09/04/2013 1028   BILIRUBINUR NEGATIVE 09/04/2013 1028   KETONESUR NEGATIVE 09/04/2013 1028   PROTEINUR NEGATIVE 09/04/2013 1028   UROBILINOGEN 0.2 09/04/2013 1028   NITRITE NEGATIVE 09/04/2013 1028   LEUKOCYTESUR NEGATIVE 09/04/2013 1028   Sepsis Labs Invalid input(s): PROCALCITONIN,  WBC,  LACTICIDVEN Microbiology Recent Results (from the past 240 hour(s))  MRSA PCR Screening     Status: None   Collection Time: 10/17/16  5:40 PM  Result Value Ref Range Status   MRSA by PCR NEGATIVE NEGATIVE Final    Comment:        The GeneXpert MRSA Assay (FDA approved for NASAL specimens only), is one component of a comprehensive MRSA colonization surveillance program. It is not intended to diagnose MRSA infection nor to guide or monitor treatment for MRSA infections.      Time coordinating discharge: Over 30 minutes  SIGNED:   Erick Blinks, DO  [facility] 10/19/2016, 12:47 PM Pager [phone]  If 7PM-7AM, please contact night-coverage [email or web address] Password [email or web address]

## 2016-10-19 NOTE — Progress Notes (Signed)
Pt's co-pay is $20.00 for Xalerto. Mail order is less for 90 days supply, cost was not given.  Mail order:[employer] [phone].

## 2016-10-20 LAB — HIV ANTIBODY (ROUTINE TESTING W REFLEX): HIV SCREEN 4TH GENERATION: NONREACTIVE

## 2016-10-23 ENCOUNTER — Other Ambulatory Visit (HOSPITAL_BASED_OUTPATIENT_CLINIC_OR_DEPARTMENT_OTHER): Payer: BLUE CROSS/BLUE SHIELD

## 2016-10-23 ENCOUNTER — Ambulatory Visit (HOSPITAL_BASED_OUTPATIENT_CLINIC_OR_DEPARTMENT_OTHER): Payer: BLUE CROSS/BLUE SHIELD | Admitting: Family

## 2016-10-23 ENCOUNTER — Encounter: Payer: Self-pay | Admitting: *Deleted

## 2016-10-23 VITALS — BP 105/67 | HR 58 | Temp 97.8°F | Resp 20 | Wt 274.0 lb

## 2016-10-23 DIAGNOSIS — I82491 Acute embolism and thrombosis of other specified deep vein of right lower extremity: Secondary | ICD-10-CM

## 2016-10-23 DIAGNOSIS — I2609 Other pulmonary embolism with acute cor pulmonale: Secondary | ICD-10-CM | POA: Diagnosis not present

## 2016-10-23 DIAGNOSIS — I2690 Septic pulmonary embolism without acute cor pulmonale: Secondary | ICD-10-CM

## 2016-10-23 DIAGNOSIS — Z7901 Long term (current) use of anticoagulants: Secondary | ICD-10-CM

## 2016-10-23 DIAGNOSIS — Z86718 Personal history of other venous thrombosis and embolism: Secondary | ICD-10-CM | POA: Diagnosis not present

## 2016-10-23 LAB — CBC WITH DIFFERENTIAL (CANCER CENTER ONLY)
BASO#: 0 10*3/uL (ref 0.0–0.2)
BASO%: 0.2 % (ref 0.0–2.0)
EOS%: 11.5 % — ABNORMAL HIGH (ref 0.0–7.0)
Eosinophils Absolute: 0.5 10*3/uL (ref 0.0–0.5)
HCT: 35.8 % (ref 34.8–46.6)
HGB: 12.1 g/dL (ref 11.6–15.9)
LYMPH#: 2.3 10*3/uL (ref 0.9–3.3)
LYMPH%: 51.8 % — ABNORMAL HIGH (ref 14.0–48.0)
MCH: 28.5 pg (ref 26.0–34.0)
MCHC: 33.8 g/dL (ref 32.0–36.0)
MCV: 84 fL (ref 81–101)
MONO#: 0.2 10*3/uL (ref 0.1–0.9)
MONO%: 5.3 % (ref 0.0–13.0)
NEUT#: 1.4 10*3/uL — ABNORMAL LOW (ref 1.5–6.5)
NEUT%: 31.2 % — ABNORMAL LOW (ref 39.6–80.0)
Platelets: 191 10*3/uL (ref 145–400)
RBC: 4.25 10*6/uL (ref 3.70–5.32)
RDW: 14.3 % (ref 11.1–15.7)
WBC: 4.3 10*3/uL (ref 3.9–10.0)

## 2016-10-23 LAB — CMP (CANCER CENTER ONLY)
ALT(SGPT): 26 U/L (ref 10–47)
AST: 26 U/L (ref 11–38)
Albumin: 3.5 g/dL (ref 3.3–5.5)
Alkaline Phosphatase: 67 U/L (ref 26–84)
BILIRUBIN TOTAL: 0.5 mg/dL (ref 0.20–1.60)
BUN: 8 mg/dL (ref 7–22)
CO2: 26 meq/L (ref 18–33)
Calcium: 8.7 mg/dL (ref 8.0–10.3)
Chloride: 107 mEq/L (ref 98–108)
Creat: 0.8 mg/dl (ref 0.6–1.2)
GLUCOSE: 125 mg/dL — AB (ref 73–118)
Potassium: 3.8 mEq/L (ref 3.3–4.7)
SODIUM: 139 meq/L (ref 128–145)
Total Protein: 7.7 g/dL (ref 6.4–8.1)

## 2016-10-23 NOTE — Progress Notes (Signed)
Hematology and Oncology Follow Up Visit  Hannah Thompson [id number] Jan 10, 1970 46 y.o. 10/23/2016   Principle Diagnosis:  Recurrent PE with right heart stain  Acute thrombus in the left gastrocnemius vein Ethnic associated leukopenia Transient thrombocytopenia  Current Therapy:   Xarelto 15 mg PO BID - currently on starter pack    Interim History:  [name] is here today for a post hospital follow-up. She was diagnosed with a recurrent PE with right heart strain last week. She had been taking Xarelto 10 mg PO daily due to not being able to afford her medication. She is now on a starter pack, taking 15 mg PO BID.   She has had no episodes of bleeding, bruising or petechiae. No lymphadenopathy.  She has a dry cough which is improved. She states that she is feeling fatigued at times but would like a note to go back to work for half days.  She has some SOB with over exertion and has had a few episodes of palpitations.  She is taking ultram as needed for any pain associated with the PE.   No fever, chills, n/v, rash, dizziness, chest pain, abdominal pain or changes in bowel or bladder habits.  No c/o pain or swelling in her extremities at this time.  Her appetite comes and goes, she is staying well hydrated. Her weight is stable.  No fall or syncopal episodes.   ECOG Performance Status: 2 - Symptomatic, <50% confined to bed  Medications:  Allergies as of 10/23/2016      Reactions   Oxaprozin    angioedema   Sulfonamide Derivatives    REACTION: hives   Orange Juice [orange Oil] Hives   Orange juice   Tomato Hives   tomatoes   Latex Rash   Nsaids Rash      Medication List       Accurate as of 10/23/16  1:41 PM. Always use your most recent med list.          acetaminophen 325 MG tablet Commonly known as:  TYLENOL Take 2 tablets (650 mg total) by mouth every 6 (six) hours as needed for mild pain, moderate pain or headache (or Fever >/= 101).   cetirizine 10 MG  tablet Commonly known as:  ZYRTEC Take 1 tablet (10 mg total) by mouth daily.   EPINEPHrine 0.3 mg/0.3 mL Devi Commonly known as:  EPI-PEN Inject 0.3 mLs (0.3 mg total) into the muscle daily as needed (Anaphylaxis).   hydrochlorothiazide 12.5 MG capsule Commonly known as:  MICROZIDE Take 1 capsule (12.5 mg total) by mouth daily.   Rivaroxaban 15 MG Tabs tablet Commonly known as:  XARELTO Take 1 tablet (15 mg total) by mouth 2 (two) times daily with a meal.   rivaroxaban 20 MG Tabs tablet Commonly known as:  XARELTO Take 1 tablet (20 mg total) by mouth daily with supper. Start taking on:  11/09/2016   traMADol 50 MG tablet Commonly known as:  ULTRAM Take 1 tablet (50 mg total) by mouth every 6 (six) hours as needed.   triamcinolone 55 MCG/ACT Aero nasal inhaler Commonly known as:  NASACORT AQ Place 2 sprays into the nose daily.       Allergies:  Allergies  Allergen Reactions  . Oxaprozin     angioedema  . Sulfonamide Derivatives     REACTION: hives  . Orange Juice [Orange Oil] Hives    Orange juice  . Tomato Hives    tomatoes  . Latex Rash  .  Nsaids Rash    Past Medical History, Surgical history, Social history, and Family History were reviewed and updated.  Review of Systems: All other 10 point review of systems is negative.   Physical Exam:  weight is 274 lb (124.3 kg). Her oral temperature is 97.8 F (36.6 C). Her blood pressure is 105/67 and her pulse is 58 (abnormal). Her respiration is 20 and oxygen saturation is 98%.   Wt Readings from Last 3 Encounters:  10/23/16 274 lb (124.3 kg)  10/17/16 276 lb 0.3 oz (125.2 kg)  09/25/16 278 lb (126.1 kg)    Ocular: Sclerae unicteric, pupils equal, round and reactive to light Ear-nose-throat: Oropharynx clear, dentition fair Lymphatic: No cervical, supraclavicular or axillary adenopathy Lungs no rales or rhonchi, good excursion bilaterally Heart regular rate and rhythm, no murmur appreciated Abd soft,  nontender, positive bowel sounds, no liver or splen tip palpated on exam, no fluid wave MSK no focal spinal tenderness, no joint edema Neuro: non-focal, well-oriented, appropriate affect Breasts: Deferred   Lab Results  Component Value Date   WBC 4.3 10/23/2016   HGB 12.1 10/23/2016   HCT 35.8 10/23/2016   MCV 84 10/23/2016   PLT 191 10/23/2016   Lab Results  Component Value Date   FERRITIN 242 08/16/2015   IRON 76 08/16/2015   TIBC 251 08/16/2015   UIBC 175 08/16/2015   IRONPCTSAT 30 08/16/2015   Lab Results  Component Value Date   RBC 4.25 10/23/2016   No results found for: KPAFRELGTCHN, LAMBDASER, KAPLAMBRATIO No results found for: IGGSERUM, IGA, IGMSERUM No results found for: Marda Stalker, SPEI   Chemistry      Component Value Date/Time   NA 139 10/23/2016 1306   NA 140 09/25/2016 1145   K 3.8 10/23/2016 1306   K 4.1 09/25/2016 1145   CL 107 10/23/2016 1306   CO2 26 10/23/2016 1306   CO2 28 09/25/2016 1145   BUN 8 10/23/2016 1306   BUN 9.5 09/25/2016 1145   CREATININE 0.8 10/23/2016 1306   CREATININE 0.8 09/25/2016 1145      Component Value Date/Time   CALCIUM 8.7 10/23/2016 1306   CALCIUM 9.6 09/25/2016 1145   ALKPHOS 67 10/23/2016 1306   ALKPHOS 72 09/25/2016 1145   AST 26 10/23/2016 1306   AST 17 09/25/2016 1145   ALT 26 10/23/2016 1306   ALT 11 09/25/2016 1145   BILITOT 0.50 10/23/2016 1306   BILITOT 0.26 09/25/2016 1145      Impression and Plan: Hannah Thompson is a pleasant 46 yo African American female with significant past history of thrombus with receent recurrent PE with right heart strain.  She is now back on Xarelto 15 mg PO BID with a starter pack to get her back on full dose anticoagulation. She had previously been on 10 mg PO daily. She will continue this same regimen.  Her symptoms continue to slowly improve since start back on her Xarelto.  We will plan to see her back in another 4 weeks  for repeat lab work and follow-up.  She is in agreement with the plan and will contact our office with any questions or concerns. We can certainly see her sooner if need be.   Verdie Mosher, NP 10/22/20181:41 PM

## 2016-11-08 ENCOUNTER — Telehealth: Payer: Self-pay | Admitting: Internal Medicine

## 2016-11-08 NOTE — Telephone Encounter (Signed)
Pt was recently hospitalized and discharged for pulmonary embolism with accute core pulmonale, hypertension and hyperkalemia. They are trying to enroll her in case management services to prevent rehospitalization. This is a courtesy message only and there is no reason to call back unless you have questions.

## 2016-11-09 NOTE — Telephone Encounter (Signed)
Tried calling pt to make hosp f/u appt no answer LMOM RTC to schedule appt...Raechel Chute/lmb

## 2016-11-20 ENCOUNTER — Ambulatory Visit: Payer: BLUE CROSS/BLUE SHIELD | Admitting: Family

## 2016-11-20 ENCOUNTER — Other Ambulatory Visit: Payer: BLUE CROSS/BLUE SHIELD

## 2016-11-22 ENCOUNTER — Other Ambulatory Visit: Payer: Self-pay

## 2016-11-22 ENCOUNTER — Encounter (HOSPITAL_BASED_OUTPATIENT_CLINIC_OR_DEPARTMENT_OTHER): Payer: Self-pay

## 2016-11-22 ENCOUNTER — Emergency Department (HOSPITAL_BASED_OUTPATIENT_CLINIC_OR_DEPARTMENT_OTHER)
Admission: EM | Admit: 2016-11-22 | Discharge: 2016-11-23 | Disposition: A | Discharge status: Home / Self Care | Payer: BLUE CROSS/BLUE SHIELD | Attending: Emergency Medicine | Admitting: Emergency Medicine

## 2016-11-22 DIAGNOSIS — Z7901 Long term (current) use of anticoagulants: Secondary | ICD-10-CM | POA: Diagnosis not present

## 2016-11-22 DIAGNOSIS — Z87891 Personal history of nicotine dependence: Secondary | ICD-10-CM | POA: Diagnosis not present

## 2016-11-22 DIAGNOSIS — Z86711 Personal history of pulmonary embolism: Secondary | ICD-10-CM | POA: Diagnosis not present

## 2016-11-22 DIAGNOSIS — I10 Essential (primary) hypertension: Secondary | ICD-10-CM | POA: Diagnosis not present

## 2016-11-22 DIAGNOSIS — Z79899 Other long term (current) drug therapy: Secondary | ICD-10-CM | POA: Diagnosis not present

## 2016-11-22 DIAGNOSIS — R0789 Other chest pain: Secondary | ICD-10-CM | POA: Diagnosis not present

## 2016-11-22 DIAGNOSIS — R079 Chest pain, unspecified: Secondary | ICD-10-CM | POA: Diagnosis present

## 2016-11-22 NOTE — ED Triage Notes (Addendum)
C/o CP x today-NAD-steady gait 

## 2016-11-23 ENCOUNTER — Emergency Department (HOSPITAL_BASED_OUTPATIENT_CLINIC_OR_DEPARTMENT_OTHER): Payer: BLUE CROSS/BLUE SHIELD

## 2016-11-23 LAB — COMPREHENSIVE METABOLIC PANEL
ALK PHOS: 59 U/L (ref 38–126)
ALT: 13 U/L — ABNORMAL LOW (ref 14–54)
ANION GAP: 6 (ref 5–15)
AST: 16 U/L (ref 15–41)
Albumin: 3.7 g/dL (ref 3.5–5.0)
BILIRUBIN TOTAL: 0.2 mg/dL — AB (ref 0.3–1.2)
BUN: 11 mg/dL (ref 6–20)
CALCIUM: 8.6 mg/dL — AB (ref 8.9–10.3)
CO2: 25 mmol/L (ref 22–32)
CREATININE: 0.71 mg/dL (ref 0.44–1.00)
Chloride: 106 mmol/L (ref 101–111)
GFR calc non Af Amer: >60 mL/min (ref >60)
GLUCOSE: 101 mg/dL — AB (ref 65–99)
Potassium: 3.4 mmol/L — ABNORMAL LOW (ref 3.5–5.1)
Sodium: 137 mmol/L (ref 135–145)
Total Protein: 7 g/dL (ref 6.5–8.1)

## 2016-11-23 LAB — CBC WITH DIFFERENTIAL/PLATELET
Basophils Absolute: 0 10*3/uL (ref 0.0–0.1)
Basophils Relative: 0 %
Eosinophils Absolute: 0.4 10*3/uL (ref 0.0–0.7)
Eosinophils Relative: 8 %
HEMATOCRIT: 32.8 % — AB (ref 36.0–46.0)
HEMOGLOBIN: 10.8 g/dL — AB (ref 12.0–15.0)
LYMPHS ABS: 2.1 10*3/uL (ref 0.7–4.0)
LYMPHS PCT: 46 %
MCH: 27.8 pg (ref 26.0–34.0)
MCHC: 32.9 g/dL (ref 30.0–36.0)
MCV: 84.3 fL (ref 78.0–100.0)
MONOS PCT: 7 %
Monocytes Absolute: 0.3 10*3/uL (ref 0.1–1.0)
NEUTROS ABS: 1.8 10*3/uL (ref 1.7–7.7)
NEUTROS PCT: 39 %
Platelets: 189 10*3/uL (ref 150–400)
RBC: 3.89 MIL/uL (ref 3.87–5.11)
RDW: 14.6 % (ref 11.5–15.5)
WBC: 4.5 10*3/uL (ref 4.0–10.5)

## 2016-11-23 LAB — D-DIMER, QUANTITATIVE (NOT AT ARMC): D DIMER QUANT: 0.47 ug{FEU}/mL (ref 0.00–0.50)

## 2016-11-23 LAB — TROPONIN I

## 2016-11-23 MED ORDER — ACETAMINOPHEN 500 MG PO TABS
1000.0000 mg | ORAL_TABLET | Freq: Once | ORAL | Status: AC
  Administered 2016-11-23: 1000 mg via ORAL
  Filled 2016-11-23: qty 2

## 2016-11-23 NOTE — ED Provider Notes (Signed)
TIME SEEN: 1:05 AM  CHIEF COMPLAINT: Chest pain  HPI: Patient is a 46 year old female with history of hypertension, hyperlipidemia, PE diagnosed on October 16 currently on Xarelto who presents to the emergency department with central sharp chest pain that started yesterday morning and has been constant.  No aggravating or relieving factors.  No new shortness of breath, nausea, vomiting, diaphoresis or dizziness.  No fever or cough.  Reports compliance with her Xarelto.  Pain reproducible with palpation.  ROS: See HPI Constitutional: no fever  Eyes: no drainage  ENT: no runny nose   Cardiovascular:   chest pain  Resp: no SOB  GI: no vomiting GU: no dysuria Integumentary: no rash  Allergy: no hives  Musculoskeletal: no leg swelling  Neurological: no slurred speech ROS otherwise negative  PAST MEDICAL HISTORY/PAST SURGICAL HISTORY:  Past Medical History:  Diagnosis Date  . ALLERGIC RHINITIS 03/17/2009  . ANEMIA-NOS 03/17/2009  . ASTHMA 03/17/2009  . DISC DISEASE, LUMBAR 03/17/2009   mild facet arthropathy by MRI 2005  . DVT (deep venous thrombosis) (HCC)   . ECZEMA 03/17/2009  . Headache(784.0) 06/21/2009   recurrent  . HYPERLIPIDEMIA 03/17/2009  . Hypertension   . KELOID 06/21/2009  . Pulmonary emboli (HCC)   . VITAMIN B12 DEFICIENCY 03/18/2009    MEDICATIONS:  Prior to Admission medications   Medication Sig Start Date End Date Taking? Authorizing Provider  acetaminophen (TYLENOL) 325 MG tablet Take 2 tablets (650 mg total) by mouth every 6 (six) hours as needed for mild pain, moderate pain or headache (or Fever >/= 101). Patient not taking: Reported on 10/17/2016 09/08/13   Elease EtienneHongalgi, Anand D, MD  cetirizine (ZYRTEC) 10 MG tablet Take 1 tablet (10 mg total) by mouth daily. 04/28/14   Corwin LevinsJohn, James W, MD  EPINEPHrine (EPI-PEN) 0.3 mg/0.3 mL DEVI Inject 0.3 mLs (0.3 mg total) into the muscle daily as needed (Anaphylaxis). 05/27/15   Corwin LevinsJohn, James W, MD  hydrochlorothiazide (MICROZIDE) 12.5  MG capsule Take 1 capsule (12.5 mg total) by mouth daily. 09/20/15   Corwin LevinsJohn, James W, MD  Rivaroxaban (XARELTO) 15 MG TABS tablet Take 1 tablet (15 mg total) by mouth 2 (two) times daily with a meal. 10/19/16   Sherryll BurgerShah, Pratik D, DO  rivaroxaban (XARELTO) 20 MG TABS tablet Take 1 tablet (20 mg total) by mouth daily with supper. 11/09/16   Sherryll BurgerShah, Pratik D, DO  traMADol (ULTRAM) 50 MG tablet Take 1 tablet (50 mg total) by mouth every 6 (six) hours as needed. 04/27/15   Corwin LevinsJohn, James W, MD  triamcinolone (NASACORT AQ) 55 MCG/ACT AERO nasal inhaler Place 2 sprays into the nose daily. Patient not taking: Reported on 10/17/2016 04/28/14   Corwin LevinsJohn, James W, MD    ALLERGIES:  Allergies  Allergen Reactions  . Oxaprozin     angioedema  . Sulfonamide Derivatives     REACTION: hives  . Orange Juice [Orange Oil] Hives    Orange juice  . Tomato Hives    tomatoes  . Latex Rash  . Nsaids Rash    SOCIAL HISTORY:  Social History   Tobacco Use  . Smoking status: Former Smoker    Packs/day: 0.25    Years: 4.00    Pack years: 1.00    Types: Cigarettes    Start date: 03/17/2004    Last attempt to quit: 04/17/2008    Years since quitting: 8.6  . Smokeless tobacco: Never Used  . Tobacco comment: quit 5 years ago  Substance Use Topics  . Alcohol  use: Yes    Alcohol/week: 0.0 oz    Comment: socially    FAMILY HISTORY: Family History  Problem Relation Age of Onset  . Cancer Father        Prostate Cancer  . Hypertension Father   . Allergies Sister   . Cancer Paternal Aunt        Ovarian Cancer  . Cancer Other        Grandparent-Lung Cancer  . Diabetes Other        Grandparent  . Heart disease Other        Grandparent  . Alcohol abuse Other        Several on both sides of family-3 uncles  . Hypertension Mother   . Diabetes Mother   . Deep vein thrombosis Sister     EXAM: BP 99/88 (BP Location: Left Arm)   Pulse (!) 50   Temp 97.7 F (36.5 C) (Oral)   Resp 18   Ht 5\' 7"  (1.702 m)   Wt 125 kg  (275 lb 9.2 oz)   LMP 11/15/2016   SpO2 99%   BMI 43.16 kg/m  CONSTITUTIONAL: Alert and oriented and responds appropriately to questions. Well-appearing; well-nourished HEAD: Normocephalic EYES: Conjunctivae clear, pupils appear equal, EOMI ENT: normal nose; moist mucous membranes NECK: Supple, no meningismus, no nuchal rigidity, no LAD  CARD: RRR; S1 and S2 appreciated; no murmurs, no clicks, no rubs, no gallops CHEST:  Chest wall is tender to palpation.  No crepitus, ecchymosis, erythema, warmth, rash or other lesions present.  RESP: Normal chest excursion without splinting or tachypnea; breath sounds clear and equal bilaterally; no wheezes, no rhonchi, no rales, no hypoxia or respiratory distress, speaking full sentences ABD/GI: Normal bowel sounds; non-distended; soft, non-tender, no rebound, no guarding, no peritoneal signs, no hepatosplenomegaly BACK:  The back appears normal and is non-tender to palpation, there is no CVA tenderness EXT: Normal ROM in all joints; non-tender to palpation; no edema; normal capillary refill; no cyanosis, no calf tenderness or swelling    SKIN: Normal color for age and race; warm; no rash NEURO: Moves all extremities equally PSYCH: The patient's mood and manner are appropriate. Grooming and personal hygiene are appropriate.  MEDICAL DECISION MAKING: Patient here with atypical chest pain.  She was recently diagnosed with a PE but is compliant with Xarelto and is not tachycardic, tachypneic, hypoxic.  D-dimer obtained in triage is negative.  Troponin also negative.  Low suspicion for ACS given pain seems very atypical and may be musculoskeletal in nature.  Doubt dissection.  I do not feel this time she needs a repeat CT scan and she is comfortable with this plan.  Chest x-ray is clear.  ED PROGRESS: She is given Tylenol for pain control.  Reports feeling better.  Her troponin is negative.  I do not think that this is ACS especially given pain has been present  for over 12 hours and she has had a negative I feel she is safe for discharge.  We will have her continue her Xarelto.  She will follow-up with her PCP.  We did discuss at length return precautions.  At this time, I do not feel there is any life-threatening condition present. I have reviewed and discussed all results (EKG, imaging, lab, urine as appropriate) and exam findings with patient/family. I have reviewed nursing notes and appropriate previous records.  I feel the patient is safe to be discharged home without further emergent workup and can continue workup as an outpatient  as needed. Discussed usual and customary return precautions. Patient/family verbalize understanding and are comfortable with this plan.  Outpatient follow-up has been provided if needed. All questions have been answered.      EKG Interpretation  Date/Time:  Wednesday November 22 2016 23:01:15 EST Ventricular Rate:  51 PR Interval:  146 QRS Duration: 90 QT Interval:  522 QTC Calculation: 481 R Axis:   11 Text Interpretation:  Sinus bradycardia Nonspecific T wave abnormality Prolonged QT Abnormal ECG No significant change since last tracing other than rate is slower Confirmed by Rochele Raring [id number]) on 11/22/2016 11:05:59 PM         Ward, Layla Maw, DO 11/23/16 [id number]

## 2016-11-23 NOTE — ED Notes (Signed)
C/o rt side cp onset yesterday  Pain increased w movement denies n/v  No inj,  Hx of pe

## 2016-11-23 NOTE — Discharge Instructions (Signed)
You may alternate Tylenol 1000 mg every 6 hours as needed for pain and Ibuprofen 800 mg every 8 hours as needed for pain.  Please take Ibuprofen with food. ° °

## 2016-12-13 ENCOUNTER — Encounter (HOSPITAL_COMMUNITY): Payer: Self-pay

## 2017-01-25 ENCOUNTER — Inpatient Hospital Stay: Payer: BLUE CROSS/BLUE SHIELD | Attending: Hematology & Oncology

## 2017-01-25 ENCOUNTER — Inpatient Hospital Stay (HOSPITAL_BASED_OUTPATIENT_CLINIC_OR_DEPARTMENT_OTHER): Payer: BLUE CROSS/BLUE SHIELD | Admitting: Hematology & Oncology

## 2017-01-25 ENCOUNTER — Other Ambulatory Visit: Payer: Self-pay

## 2017-01-25 ENCOUNTER — Encounter: Payer: Self-pay | Admitting: Hematology & Oncology

## 2017-01-25 VITALS — BP 123/79 | HR 62 | Temp 98.7°F | Resp 18 | Wt 275.0 lb

## 2017-01-25 DIAGNOSIS — D72819 Decreased white blood cell count, unspecified: Secondary | ICD-10-CM | POA: Diagnosis not present

## 2017-01-25 DIAGNOSIS — D696 Thrombocytopenia, unspecified: Secondary | ICD-10-CM

## 2017-01-25 DIAGNOSIS — Z79899 Other long term (current) drug therapy: Secondary | ICD-10-CM | POA: Diagnosis not present

## 2017-01-25 DIAGNOSIS — Z7901 Long term (current) use of anticoagulants: Secondary | ICD-10-CM

## 2017-01-25 DIAGNOSIS — Z86711 Personal history of pulmonary embolism: Secondary | ICD-10-CM

## 2017-01-25 DIAGNOSIS — I2601 Septic pulmonary embolism with acute cor pulmonale: Secondary | ICD-10-CM

## 2017-01-25 DIAGNOSIS — I2609 Other pulmonary embolism with acute cor pulmonale: Secondary | ICD-10-CM

## 2017-01-25 DIAGNOSIS — I82491 Acute embolism and thrombosis of other specified deep vein of right lower extremity: Secondary | ICD-10-CM

## 2017-01-25 LAB — CBC WITH DIFFERENTIAL (CANCER CENTER ONLY)
BASOS PCT: 0 %
Basophils Absolute: 0 10*3/uL (ref 0.0–0.1)
EOS ABS: 0.3 10*3/uL (ref 0.0–0.5)
EOS PCT: 6 %
HCT: 34.7 % — ABNORMAL LOW (ref 34.8–46.6)
Hemoglobin: 11.5 g/dL — ABNORMAL LOW (ref 11.6–15.9)
Lymphocytes Relative: 51 %
Lymphs Abs: 2.2 10*3/uL (ref 0.9–3.3)
MCH: 27.9 pg (ref 26.0–34.0)
MCHC: 33.1 g/dL (ref 32.0–36.0)
MCV: 84.2 fL (ref 81.0–101.0)
MONOS PCT: 8 %
Monocytes Absolute: 0.3 10*3/uL (ref 0.1–0.9)
Neutro Abs: 1.4 10*3/uL — ABNORMAL LOW (ref 1.5–6.5)
Neutrophils Relative %: 35 %
PLATELETS: 193 10*3/uL (ref 145–400)
RBC: 4.12 MIL/uL (ref 3.70–5.32)
RDW: 14.6 % (ref 11.1–15.7)
WBC Count: 4.2 10*3/uL (ref 3.9–10.3)

## 2017-01-25 LAB — CMP (CANCER CENTER ONLY)
ALBUMIN: 3.6 g/dL (ref 3.5–5.0)
ALT: 10 U/L (ref 0–55)
ANION GAP: 10 (ref 3–11)
AST: 15 U/L (ref 5–34)
Alkaline Phosphatase: 59 U/L (ref 40–150)
BILIRUBIN TOTAL: 0.4 mg/dL (ref 0.2–1.2)
BUN: 7 mg/dL (ref 7–26)
CHLORIDE: 108 mmol/L (ref 98–109)
CO2: 21 mmol/L — AB (ref 22–29)
Calcium: 8.9 mg/dL (ref 8.4–10.4)
Creatinine: 0.77 mg/dL (ref 0.60–1.10)
GFR, Est AFR Am: >60 mL/min (ref >60)
GFR, Estimated: >60 mL/min (ref >60)
GLUCOSE: 96 mg/dL (ref 70–140)
POTASSIUM: 4.2 mmol/L (ref 3.3–4.7)
Sodium: 139 mmol/L (ref 136–145)
TOTAL PROTEIN: 7.3 g/dL (ref 6.4–8.3)

## 2017-01-25 LAB — D-DIMER, QUANTITATIVE: D-Dimer, Quant: 0.48 ug/mL-FEU (ref 0.00–0.50)

## 2017-01-25 NOTE — Progress Notes (Signed)
Hematology and Oncology Follow Up Visit  Hannah Thompson [id number] 10/19/1970 47 y.o. 01/25/2017   Principle Diagnosis:  Recurrent PE with right heart stain - Idiopathic Ethnic associated leukopenia Transient thrombocytopenia  Current Therapy:   Xarelto 20 mg po q day - lifelong    Interim History:  [name] is here today for follow-up.  She had the recurrent pulmonary embolism back in October.  She had a past hypercoagulable study that was unremarkable for any thrombophilic state.  She has had no problems with cough or shortness of breath.    There is been no bleeding.  She has had no issues with bowels or bladder.  She has not noted any leg swelling.   Overall, her performance status is ECOG 1. Medications:  Allergies as of 01/25/2017      Reactions   Oxaprozin    angioedema   Sulfonamide Derivatives    REACTION: hives   Orange Juice [orange Oil] Hives   Orange juice   Tomato Hives   tomatoes   Latex Rash   Nsaids Rash      Medication List        Accurate as of 01/25/17 10:57 AM. Always use your most recent med list.          acetaminophen 325 MG tablet Commonly known as:  TYLENOL Take 2 tablets (650 mg total) by mouth every 6 (six) hours as needed for mild pain, moderate pain or headache (or Fever >/= 101).   cetirizine 10 MG tablet Commonly known as:  ZYRTEC Take 1 tablet (10 mg total) by mouth daily.   EPINEPHrine 0.3 mg/0.3 mL Devi Commonly known as:  EPI-PEN Inject 0.3 mLs (0.3 mg total) into the muscle daily as needed (Anaphylaxis).   hydrochlorothiazide 12.5 MG capsule Commonly known as:  MICROZIDE Take 1 capsule (12.5 mg total) by mouth daily.   Rivaroxaban 15 MG Tabs tablet Commonly known as:  XARELTO Take 1 tablet (15 mg total) by mouth 2 (two) times daily with a meal.   rivaroxaban 20 MG Tabs tablet Commonly known as:  XARELTO Take 1 tablet (20 mg total) by mouth daily with supper.   traMADol 50 MG tablet Commonly known as:   ULTRAM Take 1 tablet (50 mg total) by mouth every 6 (six) hours as needed.   triamcinolone 55 MCG/ACT Aero nasal inhaler Commonly known as:  NASACORT AQ Place 2 sprays into the nose daily.       Allergies:  Allergies  Allergen Reactions  . Oxaprozin     angioedema  . Sulfonamide Derivatives     REACTION: hives  . Orange Juice [Orange Oil] Hives    Orange juice  . Tomato Hives    tomatoes  . Latex Rash  . Nsaids Rash    Past Medical History, Surgical history, Social history, and Family History were reviewed and updated.  Review of Systems: Review of Systems  Constitutional: Negative.   HENT: Negative.   Eyes: Negative.   Respiratory: Negative.   Cardiovascular: Negative.   Gastrointestinal: Negative.   Genitourinary: Negative.   Musculoskeletal: Negative.   Skin: Negative.   Neurological: Negative.   Endo/Heme/Allergies: Negative.   Psychiatric/Behavioral: Negative.      Physical Exam:  weight is 275 lb (124.7 kg). Her oral temperature is 98.7 F (37.1 C). Her blood pressure is 123/79 and her pulse is 62. Her respiration is 18 and oxygen saturation is 96%.   Wt Readings from Last 3 Encounters:  01/25/17 275 lb (124.7 kg)  11/22/16 275 lb 9.2 oz (125 kg)  10/23/16 274 lb (124.3 kg)    Physical Exam  Constitutional: She is oriented to person, place, and time.  HENT:  Head: Normocephalic and atraumatic.  Mouth/Throat: Oropharynx is clear and moist.  Eyes: EOM are normal. Pupils are equal, round, and reactive to light.  Neck: Normal range of motion.  Cardiovascular: Normal rate, regular rhythm and normal heart sounds.  Pulmonary/Chest: Effort normal and breath sounds normal.  Abdominal: Soft. Bowel sounds are normal.  Musculoskeletal: Normal range of motion. She exhibits no edema, tenderness or deformity.  Lymphadenopathy:    She has no cervical adenopathy.  Neurological: She is alert and oriented to person, place, and time.  Skin: Skin is warm and dry.  No rash noted. No erythema.  Psychiatric: She has a normal mood and affect. Her behavior is normal. Judgment and thought content normal.  Vitals reviewed.   Lab Results  Component Value Date   WBC 4.2 01/25/2017   HGB 10.8 (L) 11/23/2016   HCT 34.7 (L) 01/25/2017   MCV 84.2 01/25/2017   PLT 193 01/25/2017   Lab Results  Component Value Date   FERRITIN 242 08/16/2015   IRON 76 08/16/2015   TIBC 251 08/16/2015   UIBC 175 08/16/2015   IRONPCTSAT 30 08/16/2015   Lab Results  Component Value Date   RBC 4.12 01/25/2017   No results found for: KPAFRELGTCHN, LAMBDASER, KAPLAMBRATIO No results found for: IGGSERUM, IGA, IGMSERUM No results found for: Marda Stalker, SPEI   Chemistry      Component Value Date/Time   NA 137 11/23/2016 0021   NA 139 10/23/2016 1306   NA 140 09/25/2016 1145   K 3.4 (L) 11/23/2016 0021   K 3.8 10/23/2016 1306   K 4.1 09/25/2016 1145   CL 106 11/23/2016 0021   CL 107 10/23/2016 1306   CO2 25 11/23/2016 0021   CO2 26 10/23/2016 1306   CO2 28 09/25/2016 1145   BUN 11 11/23/2016 0021   BUN 8 10/23/2016 1306   BUN 9.5 09/25/2016 1145   CREATININE 0.71 11/23/2016 0021   CREATININE 0.8 10/23/2016 1306   CREATININE 0.8 09/25/2016 1145      Component Value Date/Time   CALCIUM 8.6 (L) 11/23/2016 0021   CALCIUM 8.7 10/23/2016 1306   CALCIUM 9.6 09/25/2016 1145   ALKPHOS 59 11/23/2016 0021   ALKPHOS 67 10/23/2016 1306   ALKPHOS 72 09/25/2016 1145   AST 16 11/23/2016 0021   AST 26 10/23/2016 1306   AST 17 09/25/2016 1145   ALT 13 (L) 11/23/2016 0021   ALT 26 10/23/2016 1306   ALT 11 09/25/2016 1145   BILITOT 0.2 (L) 11/23/2016 0021   BILITOT 0.50 10/23/2016 1306   BILITOT 0.26 09/25/2016 1145      Impression and Plan: Ms. Screws is a pleasant 47yo African American female with significant past history of thrombus with receent recurrent PE with right heart strain.    From my point of view,  she will need lifelong anticoagulation.  She has had another pulmonary embolism.  When she comes back in 3 months, we will repeat her CT angiogram and see how everything looks.  I am happy that she is doing okay.   Josph Macho, MD 1/24/201910:57 AM

## 2017-02-26 ENCOUNTER — Emergency Department (HOSPITAL_BASED_OUTPATIENT_CLINIC_OR_DEPARTMENT_OTHER): Payer: BLUE CROSS/BLUE SHIELD

## 2017-02-26 ENCOUNTER — Inpatient Hospital Stay (HOSPITAL_BASED_OUTPATIENT_CLINIC_OR_DEPARTMENT_OTHER)
Admission: EM | Admit: 2017-02-26 | Discharge: 2017-02-28 | DRG: 176 | Disposition: A | Discharge status: Home / Self Care | Payer: BLUE CROSS/BLUE SHIELD | Attending: Internal Medicine | Admitting: Internal Medicine

## 2017-02-26 ENCOUNTER — Other Ambulatory Visit: Payer: Self-pay

## 2017-02-26 ENCOUNTER — Encounter (HOSPITAL_BASED_OUTPATIENT_CLINIC_OR_DEPARTMENT_OTHER): Payer: Self-pay | Admitting: *Deleted

## 2017-02-26 DIAGNOSIS — Z7901 Long term (current) use of anticoagulants: Secondary | ICD-10-CM | POA: Diagnosis not present

## 2017-02-26 DIAGNOSIS — Z888 Allergy status to other drugs, medicaments and biological substances status: Secondary | ICD-10-CM | POA: Diagnosis not present

## 2017-02-26 DIAGNOSIS — Z8041 Family history of malignant neoplasm of ovary: Secondary | ICD-10-CM

## 2017-02-26 DIAGNOSIS — Z801 Family history of malignant neoplasm of trachea, bronchus and lung: Secondary | ICD-10-CM

## 2017-02-26 DIAGNOSIS — Z91040 Latex allergy status: Secondary | ICD-10-CM | POA: Diagnosis not present

## 2017-02-26 DIAGNOSIS — Z6841 Body Mass Index (BMI) 40.0 and over, adult: Secondary | ICD-10-CM | POA: Diagnosis not present

## 2017-02-26 DIAGNOSIS — E538 Deficiency of other specified B group vitamins: Secondary | ICD-10-CM | POA: Diagnosis present

## 2017-02-26 DIAGNOSIS — I2699 Other pulmonary embolism without acute cor pulmonale: Principal | ICD-10-CM | POA: Diagnosis present

## 2017-02-26 DIAGNOSIS — Z91018 Allergy to other foods: Secondary | ICD-10-CM

## 2017-02-26 DIAGNOSIS — Z79891 Long term (current) use of opiate analgesic: Secondary | ICD-10-CM

## 2017-02-26 DIAGNOSIS — I110 Hypertensive heart disease with heart failure: Secondary | ICD-10-CM | POA: Diagnosis present

## 2017-02-26 DIAGNOSIS — I5032 Chronic diastolic (congestive) heart failure: Secondary | ICD-10-CM | POA: Diagnosis present

## 2017-02-26 DIAGNOSIS — Z79899 Other long term (current) drug therapy: Secondary | ICD-10-CM | POA: Diagnosis not present

## 2017-02-26 DIAGNOSIS — Z86711 Personal history of pulmonary embolism: Secondary | ICD-10-CM | POA: Diagnosis not present

## 2017-02-26 DIAGNOSIS — Z87891 Personal history of nicotine dependence: Secondary | ICD-10-CM

## 2017-02-26 DIAGNOSIS — E785 Hyperlipidemia, unspecified: Secondary | ICD-10-CM | POA: Diagnosis present

## 2017-02-26 DIAGNOSIS — Z8249 Family history of ischemic heart disease and other diseases of the circulatory system: Secondary | ICD-10-CM

## 2017-02-26 DIAGNOSIS — Z8042 Family history of malignant neoplasm of prostate: Secondary | ICD-10-CM | POA: Diagnosis not present

## 2017-02-26 DIAGNOSIS — Z86718 Personal history of other venous thrombosis and embolism: Secondary | ICD-10-CM | POA: Diagnosis not present

## 2017-02-26 DIAGNOSIS — Z0189 Encounter for other specified special examinations: Secondary | ICD-10-CM

## 2017-02-26 DIAGNOSIS — Z882 Allergy status to sulfonamides status: Secondary | ICD-10-CM

## 2017-02-26 DIAGNOSIS — Z833 Family history of diabetes mellitus: Secondary | ICD-10-CM

## 2017-02-26 HISTORY — DX: Anemia, unspecified: D64.9

## 2017-02-26 LAB — CBC WITH DIFFERENTIAL/PLATELET
BASOS ABS: 0 10*3/uL (ref 0.0–0.1)
Basophils Relative: 0 %
EOS ABS: 0.4 10*3/uL (ref 0.0–0.7)
EOS PCT: 9 %
HCT: 36 % (ref 36.0–46.0)
Hemoglobin: 12 g/dL (ref 12.0–15.0)
LYMPHS PCT: 41 %
Lymphs Abs: 1.6 10*3/uL (ref 0.7–4.0)
MCH: 27.8 pg (ref 26.0–34.0)
MCHC: 33.3 g/dL (ref 30.0–36.0)
MCV: 83.3 fL (ref 78.0–100.0)
MONO ABS: 0.3 10*3/uL (ref 0.1–1.0)
Monocytes Relative: 7 %
Neutro Abs: 1.7 10*3/uL (ref 1.7–7.7)
Neutrophils Relative %: 43 %
PLATELETS: 206 10*3/uL (ref 150–400)
RBC: 4.32 MIL/uL (ref 3.87–5.11)
RDW: 14.9 % (ref 11.5–15.5)
WBC: 3.9 10*3/uL — ABNORMAL LOW (ref 4.0–10.5)

## 2017-02-26 LAB — COMPREHENSIVE METABOLIC PANEL
ALK PHOS: 60 U/L (ref 38–126)
ALT: 13 U/L — AB (ref 14–54)
AST: 19 U/L (ref 15–41)
Albumin: 3.9 g/dL (ref 3.5–5.0)
Anion gap: 8 (ref 5–15)
BILIRUBIN TOTAL: 0.4 mg/dL (ref 0.3–1.2)
BUN: 9 mg/dL (ref 6–20)
CALCIUM: 9.1 mg/dL (ref 8.9–10.3)
CO2: 24 mmol/L (ref 22–32)
CREATININE: 0.69 mg/dL (ref 0.44–1.00)
Chloride: 108 mmol/L (ref 101–111)
GFR calc Af Amer: >60 mL/min (ref >60)
GFR calc non Af Amer: >60 mL/min (ref >60)
Glucose, Bld: 96 mg/dL (ref 65–99)
POTASSIUM: 3.9 mmol/L (ref 3.5–5.1)
Sodium: 140 mmol/L (ref 135–145)
TOTAL PROTEIN: 7.6 g/dL (ref 6.5–8.1)

## 2017-02-26 LAB — PROTIME-INR
INR: 1.29
Prothrombin Time: 16 seconds — ABNORMAL HIGH (ref 11.4–15.2)

## 2017-02-26 LAB — TROPONIN I

## 2017-02-26 LAB — D-DIMER, QUANTITATIVE: D-Dimer, Quant: 0.69 ug/mL-FEU — ABNORMAL HIGH (ref 0.00–0.50)

## 2017-02-26 MED ORDER — SODIUM CHLORIDE 0.9 % IV SOLN: 250.0000 mL | INTRAVENOUS | Status: DC | PRN

## 2017-02-26 MED ORDER — WARFARIN SODIUM 5 MG PO TABS
7.5000 mg | ORAL_TABLET | Freq: Once | ORAL | Status: AC
  Administered 2017-02-26: 7.5 mg via ORAL
  Filled 2017-02-26: qty 1

## 2017-02-26 MED ORDER — SODIUM CHLORIDE 0.9% FLUSH: 3.0000 mL | INTRAVENOUS | Status: DC | PRN

## 2017-02-26 MED ORDER — HYDROCHLOROTHIAZIDE 12.5 MG PO CAPS
12.5000 mg | ORAL_CAPSULE | Freq: Every day | ORAL | Status: DC
  Administered 2017-02-26 – 2017-02-28 (×3): 12.5 mg via ORAL
  Filled 2017-02-26 (×3): qty 1

## 2017-02-26 MED ORDER — WARFARIN - PHARMACIST DOSING INPATIENT: Freq: Every day | Status: DC

## 2017-02-26 MED ORDER — POLYETHYLENE GLYCOL 3350 17 G PO PACK: 17.0000 g | PACK | Freq: Every day | ORAL | Status: DC | PRN

## 2017-02-26 MED ORDER — EPINEPHRINE 0.3 MG/0.3ML IJ SOAJ
0.3000 mg | Freq: Every day | INTRAMUSCULAR | Status: DC | PRN
  Filled 2017-02-26: qty 0.3

## 2017-02-26 MED ORDER — ENOXAPARIN SODIUM 120 MG/0.8ML ~~LOC~~ SOLN
120.0000 mg | Freq: Two times a day (BID) | SUBCUTANEOUS | Status: DC
  Administered 2017-02-26 – 2017-02-28 (×4): 120 mg via SUBCUTANEOUS
  Filled 2017-02-26 (×4): qty 0.8

## 2017-02-26 MED ORDER — IOPAMIDOL (ISOVUE-370) INJECTION 76%
100.0000 mL | Freq: Once | INTRAVENOUS | Status: AC | PRN
  Administered 2017-02-26: 100 mL via INTRAVENOUS

## 2017-02-26 MED ORDER — ACETAMINOPHEN 650 MG RE SUPP: 650.0000 mg | Freq: Four times a day (QID) | RECTAL | Status: DC | PRN

## 2017-02-26 MED ORDER — ACETAMINOPHEN 325 MG PO TABS
650.0000 mg | ORAL_TABLET | Freq: Four times a day (QID) | ORAL | Status: DC | PRN
  Administered 2017-02-26 – 2017-02-28 (×4): 650 mg via ORAL
  Filled 2017-02-26 (×4): qty 2

## 2017-02-26 MED ORDER — SODIUM CHLORIDE 0.9% FLUSH
3.0000 mL | Freq: Two times a day (BID) | INTRAVENOUS | Status: DC
  Administered 2017-02-26 – 2017-02-27 (×3): 3 mL via INTRAVENOUS

## 2017-02-26 NOTE — ED Provider Notes (Signed)
[facility] EMERGENCY DEPARTMENT Provider Note   CSN: [id number] Arrival date & time: 02/26/17  1001     History   Chief Complaint Chief Complaint  Patient presents with  . Chest Pain    HPI Hannah Thompson is a 47 y.o. female.  HPI   Patient is a 47 year old female past medical history is significant for 2 pulmonary embolisms.  Patient reports that she had one, she was on anticoagulant shin for 1 year, came off anticoagulation and then got a second pulmonary embolism.  Patient is currently on Xarelto.  She reports sharp chest pain in her left chest wall this morning.  Is now changed to a dull pain.  Patient has shortness of breath when she had chest pain.  That is now resolved.  Patient feels much better now.  No nausea vomiting no associated diaphoresis.  Chest pain radiated to the back when she first had up but now radiates nowhere.  Patient has no past medical history significant for hypertension hyperlipidemia diabetes.  Patient does have family history of early cardiac death in a brother who had a  pericardial effusion.  Past Medical History:  Diagnosis Date  . ALLERGIC RHINITIS 03/17/2009  . ANEMIA-NOS 03/17/2009  . ASTHMA 03/17/2009  . DISC DISEASE, LUMBAR 03/17/2009   mild facet arthropathy by MRI 2005  . DVT (deep venous thrombosis) (HCC)   . ECZEMA 03/17/2009  . Headache(784.0) 06/21/2009   recurrent  . HYPERLIPIDEMIA 03/17/2009  . Hypertension   . KELOID 06/21/2009  . Pulmonary emboli (HCC)   . VITAMIN B12 DEFICIENCY 03/18/2009    Patient Active Problem List   Diagnosis Date Noted  . Recurrent pulmonary embolism (HCC) 02/26/2017  . Pulmonary embolism (HCC) 10/17/2016  . Hypertension 10/17/2016  . Hypokalemia 10/17/2016  . Toxic conjunctivitis, left 08/12/2016  . Encounter for well adult exam with abnormal findings 06/27/2016  . Acute pain of right shoulder 06/27/2016  . Foreign body in right foot 03/27/2016  . Grade 2 ankle sprain 07/27/2015  .  Headache 04/27/2015  . Hyperpigmentation 03/01/2015  . Skin trauma 03/01/2015  . Medial meniscus tear 01/13/2015  . Effusion of left knee 01/13/2015  . Encounter for therapeutic drug monitoring 07/07/2014  . Pedal edema 04/28/2014  . Chronic low back pain 02/03/2014  . Burn of right foot 12/19/2013  . Cellulitis 11/21/2013  . Therapeutic drug monitoring 11/21/2013  . Chronic anticoagulation 10/21/2013  . Pulmonary embolus (HCC) 10/15/2013  . Late effect of superficial injury 09/11/2013  . Pruritus 09/11/2013  . Acute pulmonary embolism-submassive with right heart strain 09/04/2013  . Acute deep vein thrombosis (DVT) of other specified vein of right lower extremity (HCC) 09/04/2013  . Thrombocytopenia (HCC) 09/04/2013  . Syncope and collapse 09/04/2013  . Grief 08/18/2013  . Right arm pain 05/30/2013  . Angioedema 03/14/2013  . Obesity 05/13/2011  . KELOID 06/21/2009  . SKIN LESION 06/21/2009  . Headache(784.0) 06/21/2009  . VITAMIN B12 DEFICIENCY 03/18/2009  . Hyperlipidemia 03/17/2009  . ANEMIA-NOS 03/17/2009  . Allergic rhinitis 03/17/2009  . Asthma 03/17/2009  . ECZEMA 03/17/2009  . DISC DISEASE, LUMBAR 03/17/2009  . FATIGUE 03/17/2009    Past Surgical History:  Procedure Laterality Date  . KELOID EXCISION  mid 1990's   s/p keloid removal-laser     OB History    Gravida Para Term Preterm AB Living   5 3 3   2 3    SAB TAB Ectopic Multiple Live Births   1 1  Home Medications    Prior to Admission medications   Medication Sig Start Date End Date Taking? Authorizing Provider  acetaminophen (TYLENOL) 325 MG tablet Take 2 tablets (650 mg total) by mouth every 6 (six) hours as needed for mild pain, moderate pain or headache (or Fever >/= 101). Patient not taking: Reported on 10/17/2016 09/08/13   Elease Etienne, MD  cetirizine (ZYRTEC) 10 MG tablet Take 1 tablet (10 mg total) by mouth daily. 04/28/14   Corwin Levins, MD  EPINEPHrine (EPI-PEN) 0.3  mg/0.3 mL DEVI Inject 0.3 mLs (0.3 mg total) into the muscle daily as needed (Anaphylaxis). 05/27/15   Corwin Levins, MD  hydrochlorothiazide (MICROZIDE) 12.5 MG capsule Take 1 capsule (12.5 mg total) by mouth daily. 09/20/15   Corwin Levins, MD  Rivaroxaban (XARELTO) 15 MG TABS tablet Take 1 tablet (15 mg total) by mouth 2 (two) times daily with a meal. 10/19/16   Sherryll Burger, Pratik D, DO  rivaroxaban (XARELTO) 20 MG TABS tablet Take 1 tablet (20 mg total) by mouth daily with supper. 11/09/16   Sherryll Burger, Pratik D, DO  traMADol (ULTRAM) 50 MG tablet Take 1 tablet (50 mg total) by mouth every 6 (six) hours as needed. 04/27/15   Corwin Levins, MD  triamcinolone (NASACORT AQ) 55 MCG/ACT AERO nasal inhaler Place 2 sprays into the nose daily. Patient not taking: Reported on 10/17/2016 04/28/14   Corwin Levins, MD    Family History Family History  Problem Relation Age of Onset  . Cancer Father        Prostate Cancer  . Hypertension Father   . Allergies Sister   . Cancer Paternal Aunt        Ovarian Cancer  . Cancer Other        Grandparent-Lung Cancer  . Diabetes Other        Grandparent  . Heart disease Other        Grandparent  . Alcohol abuse Other        Several on both sides of family-3 uncles  . Hypertension Mother   . Diabetes Mother   . Deep vein thrombosis Sister     Social History Social History   Tobacco Use  . Smoking status: Former Smoker    Packs/day: 0.25    Years: 4.00    Pack years: 1.00    Types: Cigarettes    Start date: 03/17/2004    Last attempt to quit: 04/17/2008    Years since quitting: 8.8  . Smokeless tobacco: Never Used  . Tobacco comment: quit 5 years ago  Substance Use Topics  . Alcohol use: Yes    Alcohol/week: 0.0 oz    Comment: socially  . Drug use: No     Allergies   Oxaprozin; Sulfonamide derivatives; Orange juice [orange oil]; Tomato; Latex; and Nsaids   Review of Systems Review of Systems  Constitutional: Negative for activity change, fatigue  and fever.  Respiratory: Positive for chest tightness and shortness of breath.   Cardiovascular: Negative for chest pain.  Gastrointestinal: Negative for abdominal pain.     Physical Exam Updated Vital Signs BP (!) 113/93 (BP Location: Right Arm)   Pulse (!) 55   Temp (!) 97.5 F (36.4 C) (Oral)   Resp 18   Ht 5\' 7"  (1.702 m)   Wt 124.3 kg (274 lb)   LMP 02/19/2017   SpO2 99%   BMI 42.91 kg/m   Physical Exam  Constitutional: She is oriented to person, place,  and time. She appears well-developed and well-nourished.  HENT:  Head: Normocephalic and atraumatic.  Eyes: EOM are normal. Pupils are equal, round, and reactive to light. Right eye exhibits no discharge.  Cardiovascular: Normal rate, regular rhythm, intact distal pulses and normal pulses.  Pulmonary/Chest: Effort normal and breath sounds normal. No accessory muscle usage. No respiratory distress.  Musculoskeletal:       Right lower leg: She exhibits no edema.  Neurological: She is oriented to person, place, and time.  Skin: Skin is warm and dry. She is not diaphoretic.  Psychiatric: She has a normal mood and affect.  Nursing note and vitals reviewed.    ED Treatments / Results  Labs (all labs ordered are listed, but only abnormal results are displayed) Labs Reviewed  COMPREHENSIVE METABOLIC PANEL - Abnormal; Notable for the following components:      Result Value   ALT 13 (*)    All other components within normal limits  CBC WITH DIFFERENTIAL/PLATELET - Abnormal; Notable for the following components:   WBC 3.9 (*)    All other components within normal limits  D-DIMER, QUANTITATIVE (NOT AT [facility]) - Abnormal; Notable for the following components:   D-Dimer, Quant 0.69 (*)    All other components within normal limits  PROTIME-INR - Abnormal; Notable for the following components:   Prothrombin Time 16.0 (*)    All other components within normal limits  TROPONIN I  ANTITHROMBIN III  PROTEIN C ACTIVITY  PROTEIN  C, TOTAL  PROTEIN S ACTIVITY  PROTEIN S, TOTAL  LUPUS ANTICOAGULANT PANEL  BETA-2-GLYCOPROTEIN I ABS, IGG/M/A  HOMOCYSTEINE  FACTOR 5 LEIDEN  PROTHROMBIN GENE MUTATION  CARDIOLIPIN ANTIBODIES, IGG, IGM, IGA    EKG  EKG Interpretation  Date/Time:  Monday February 26 2017 10:11:38 EST Ventricular Rate:  68 PR Interval:    QRS Duration: 94 QT Interval:  462 QTC Calculation: 492 R Axis:   1 Text Interpretation:  Sinus rhythm T wave inversion in lateral leads similar to prior.  Confirmed by Bary CastillaMackuen,  ([id number]) on 02/26/2017 10:15:16 AM Also confirmed by Bary CastillaMackuen,  ([id number]), editor Elita QuickWatlington, [place] [id number])  on 02/26/2017 12:33:03 PM       Radiology Dg Chest 2 View  Result Date: 02/26/2017 CLINICAL DATA:  Chest pain EXAM: CHEST  2 VIEW COMPARISON:  November 23, 2016 FINDINGS: There is no edema or consolidation. The heart size and pulmonary vascularity are normal. No adenopathy. No bone lesions. No pneumothorax. IMPRESSION: No edema or consolidation. Electronically Signed   By: Bretta BangWilliam  Woodruff III M.D.   On: 02/26/2017 10:27   Ct Angio Chest Pe W And/or Wo Contrast  Result Date: 02/26/2017 CLINICAL DATA:  Chest pain. Positive D-dimer. History of pulmonary embolus. EXAM: CT ANGIOGRAPHY CHEST WITH CONTRAST TECHNIQUE: Multidetector CT imaging of the chest was performed using the standard protocol during bolus administration of intravenous contrast. Multiplanar CT image reconstructions and MIPs were obtained to evaluate the vascular anatomy. CONTRAST:  100mL ISOVUE-370 IOPAMIDOL (ISOVUE-370) INJECTION 76% COMPARISON:  CT angiogram of the chest dated 10/17/2016 and chest x-ray dated 02/26/2017 FINDINGS: Cardiovascular: There are small recurrent emboli in the posterosuperior aspect of the right upper lobe. No other pulmonary emboli. The pulmonary emboli seen elsewhere in the lungs on the prior study have resolved. Heart size is normal. Mediastinum/Nodes: No enlarged mediastinal,  hilar, or axillary lymph nodes. Thyroid gland, trachea, and esophagus demonstrate no significant findings. Lungs/Pleura: Lungs are clear. No pleural effusion or pneumothorax. Upper Abdomen: Normal. Musculoskeletal: No chest wall abnormality. No  acute or significant osseous findings. Review of the MIP images confirms the above findings. IMPRESSION: Recurrent small pulmonary emboli to the right upper lobe. Electronically Signed   By: Francene Boyers M.D.   On: 02/26/2017 12:12    Procedures Procedures (including critical care time)  Medications Ordered in ED Medications  iopamidol (ISOVUE-370) 76 % injection 100 mL (100 mLs Intravenous Contrast Given 02/26/17 1151)     Initial Impression / Assessment and Plan / ED Course  I have reviewed the triage vital signs and the nursing notes.  Pertinent labs & imaging results that were available during my care of the patient were reviewed by me and considered in my medical decision making (see chart for details).      Patient is a 47 year old female past medical history is significant for 2 pulmonary embolisms.  Patient reports that she had one, she was on anticoagulant shin for 1 year, came off anticoagulation and then got a second pulmonary embolism.  Patient is currently on Xarelto.  She reports sharp chest pain in her left chest wall this morning.  Is now changed to a dull pain.  Patient has shortness of breath when she had chest pain.  That is now resolved.  Patient feels much better now.  No nausea vomiting no associated diaphoresis.  Chest pain radiated to the back when she first had up but now radiates nowhere.  Patient has no past medical history significant for hypertension hyperlipidemia  10:43 AM' Very atypical sounding chest pain.  Will get d-dimer to decide whether additional imaging is necessary.  Does not sound typical for pulmonary embolism given the lack of shortness of breath and current non-symptoms.  However patient has had  previous.  CT angio show recurrent PE despite being on anticoagulation.  Will admit for heme onc consult, switch of anticoagulation and further consideration.   Final Clinical Impressions(s) / ED Diagnoses   Final diagnoses:  None    ED Discharge Orders    None       , Cindee Salt, MD 02/26/17 1551

## 2017-02-26 NOTE — ED Triage Notes (Signed)
Pt reports sudden onset of sharp mid sternal chest pains radiating to left shoulder, pt states she has hx of pe and this feels similar to her pe chest pain. Pt takes xarelto and has not missed any doses. Denies cough or sob.

## 2017-02-26 NOTE — Progress Notes (Signed)
Patient is a 47yo female with h/o 2 prior PEs - Coumadin x 1 year; has been on Xarelto recently.  +D-dimer.  Now with PE while on Xarelto.  Normal vital signs.   Would recommend Lovenox BID x 5 days and resume Coumadin with short-term hematology outpatient consult.  On further discussion, the patient had a brother who died of some kind of hypercoagulable condition in his 30s and there is concern for a familial hypercoagulability issue.  [facility] and ask for hematology consult.  Georgana CurioJennifer E. , M.D.

## 2017-02-26 NOTE — ED Notes (Signed)
Unable to draw the labs requested by the Admitting MD at this time. EDP aware

## 2017-02-26 NOTE — Progress Notes (Signed)
ANTICOAGULATION CONSULT NOTE  Pharmacy Consult for Lovenox Indication:   Allergies  Allergen Reactions  . Oxaprozin     angioedema  . Sulfonamide Derivatives     REACTION: hives  . Orange Juice [Orange Oil] Hives    Orange juice  . Tomato Hives    tomatoes  . Latex Rash  . Nsaids Rash    Patient Measurements: Height: 5\' 7"  (170.2 cm) Weight: 271 lb 6.2 oz (123.1 kg) IBW/kg (Calculated) : 61.6  Vital Signs: Temp: 98 F (36.7 C) (02/25 1721) Temp Source: Oral (02/25 1721) BP: 128/83 (02/25 1721) Pulse Rate: 59 (02/25 1721)  Labs: Recent Labs    02/26/17 1023 02/26/17 1024  HGB 12.0  --   HCT 36.0  --   PLT 206  --   LABPROT 16.0*  --   INR 1.29  --   CREATININE 0.69  --   TROPONINI  --  <0.03    Estimated Creatinine Clearance: 119.6 mL/min (by C-G formula based on SCr of 0.69 mg/dL).   Medical History: Past Medical History:  Diagnosis Date  . ALLERGIC RHINITIS 03/17/2009  . Anemia   . ASTHMA 03/17/2009  . DVT (deep venous thrombosis) (HCC)   . ECZEMA 03/17/2009  . Headache(784.0) 06/21/2009   recurrent  . HYPERLIPIDEMIA 03/17/2009  . KELOID 06/21/2009  . Pulmonary emboli (HCC)   . VITAMIN B12 DEFICIENCY 03/18/2009    Medications:  Medications Prior to Admission  Medication Sig Dispense Refill Last Dose  . hydrochlorothiazide (MICROZIDE) 12.5 MG capsule Take 1 capsule (12.5 mg total) by mouth daily. 90 capsule 1 Past Week at Unknown time  . rivaroxaban (XARELTO) 20 MG TABS tablet Take 1 tablet (20 mg total) by mouth daily with supper. 30 tablet 3 02/25/2017 at 1000 pm  . acetaminophen (TYLENOL) 325 MG tablet Take 2 tablets (650 mg total) by mouth every 6 (six) hours as needed for mild pain, moderate pain or headache (or Fever >/= 101). (Patient not taking: Reported on 10/17/2016)   Not Taking at Unknown time  . cetirizine (ZYRTEC) 10 MG tablet Take 1 tablet (10 mg total) by mouth daily. (Patient not taking: Reported on 02/26/2017) 30 tablet 11 Not Taking at  Unknown time  . EPINEPHrine (EPI-PEN) 0.3 mg/0.3 mL DEVI Inject 0.3 mLs (0.3 mg total) into the muscle daily as needed (Anaphylaxis). 2 Device 1 ON HAND  . Rivaroxaban (XARELTO) 15 MG TABS tablet Take 1 tablet (15 mg total) by mouth 2 (two) times daily with a meal. (Patient not taking: Reported on 02/26/2017) 42 tablet 0 Not Taking at Unknown time  . traMADol (ULTRAM) 50 MG tablet Take 1 tablet (50 mg total) by mouth every 6 (six) hours as needed. (Patient not taking: Reported on 02/26/2017) 60 tablet 1 Not Taking at Unknown time  . triamcinolone (NASACORT AQ) 55 MCG/ACT AERO nasal inhaler Place 2 sprays into the nose daily. (Patient not taking: Reported on 02/26/2017) 1 Inhaler 12 Not Taking at Unknown time   Scheduled:  . enoxaparin (LOVENOX) injection  120 mg Subcutaneous Q12H  . hydrochlorothiazide  12.5 mg Oral Daily  . sodium chloride flush  3 mL Intravenous Q12H   PRN: sodium chloride, acetaminophen **OR** acetaminophen, EPINEPHrine, polyethylene glycol, sodium chloride flush  Assessment: 47 y.o. female with PMH multiple PEs now on Xarelto and found to have recurrent small PE on 02/26/2017. Pharmacy consulted to dose Enoxaparin and Warfarin. Strong family Hx of hypercoagulability; HC panel in process.   After initial PE was placed on warfarin  through Nov 2017 (2 yr tx). At time of discontinuation warfarin regimen was 5 mg daily except for 7.5 mg on Wed, although the majority of INRs were below goal at that time.  Started on Xarelto for second PE in Oct 2018; currently on 20 mg/d.  Today, 02/26/2017:  CBC: WNL  SCr: WNL, CrCl (N) ~100 ml/min  INR subtherapeutic  Diet ordered  No drug-drug interactions noted  Goal of Therapy: Anti-Xa level 0.6-1 units/ml 4hrs after LMWH dose given INR 2-3  Plan:  Lovenox 120 mg SQ q12 hr  Warfarin 7.5 mg PO x 1 tonight  Daily INR, SCr weekly while on Lovenox  Monitor for signs of bleeding or worsening thrombosis  Minimum recommended  duration of LMWH-warfarin overlap is 5 days AND until at least 2 consecutive therapeutic INRs achieved   Bernadene Person, PharmD, BCPS [phone] 02/26/2017, 5:52 PM

## 2017-02-26 NOTE — ED Notes (Signed)
[facility] at Bedside to transport

## 2017-02-26 NOTE — ED Notes (Signed)
Report to 4E RN Essi

## 2017-02-26 NOTE — H&P (Addendum)
History and Physical    Hannah Thompson ZOX:[id number] DOB: October 08, 1970 DOA: 02/26/2017  PCP: Corwin Levins, MD  Patient coming from: home  Chief Complaint: SOB  HPI: Hannah Thompson is a 46 y.o. female with medical history significant of of allergic rhinitis, not on oral contraceptive pills, essential hypertension and vitamin B12 deficiency history of DVT and PE back in 2017 which she completed her treatment and came back again and October 2018 was discharged on Xarelto comes into the hospital complaining of shortness of breath and sharp chest pain that started on the day of admission she relates that when she got to the ED it was resolved.  ED Course:  Basic metabolic panel was done that was unremarkable, CBC showed mild leukopenia, and CT angios showed new recurrent pulmonary embolism  Review of Systems: As per HPI otherwise 10 point review of systems negative.    Past Medical History:  Diagnosis Date  . ALLERGIC RHINITIS 03/17/2009  . Anemia   . ASTHMA 03/17/2009  . DVT (deep venous thrombosis) (HCC)   . ECZEMA 03/17/2009  . Headache(784.0) 06/21/2009   recurrent  . HYPERLIPIDEMIA 03/17/2009  . KELOID 06/21/2009  . Pulmonary emboli (HCC)   . VITAMIN B12 DEFICIENCY 03/18/2009    Past Surgical History:  Procedure Laterality Date  . KELOID EXCISION  mid 1990's   s/p keloid removal-laser      reports that she quit smoking about 8 years ago. Her smoking use included cigarettes. She started smoking about 12 years ago. She has a 1.00 pack-year smoking history. she has never used smokeless tobacco. She reports that she drinks alcohol. She reports that she does not use drugs.  Allergies  Allergen Reactions  . Oxaprozin     angioedema  . Sulfonamide Derivatives     REACTION: hives  . Orange Juice [Orange Oil] Hives    Orange juice  . Tomato Hives    tomatoes  . Latex Rash  . Nsaids Rash    Family History  Problem Relation Age of Onset  . Cancer Father        Prostate Cancer    . Hypertension Father   . Allergies Sister   . Cancer Paternal Aunt        Ovarian Cancer  . Cancer Other        Grandparent-Lung Cancer  . Diabetes Other        Grandparent  . Heart disease Other        Grandparent  . Alcohol abuse Other        Several on both sides of family-3 uncles  . Hypertension Mother   . Diabetes Mother   . Deep vein thrombosis Sister     Prior to Admission medications   Medication Sig Start Date End Date Taking? Authorizing Provider  acetaminophen (TYLENOL) 325 MG tablet Take 2 tablets (650 mg total) by mouth every 6 (six) hours as needed for mild pain, moderate pain or headache (or Fever >/= 101). Patient not taking: Reported on 10/17/2016 09/08/13   Elease Etienne, MD  cetirizine (ZYRTEC) 10 MG tablet Take 1 tablet (10 mg total) by mouth daily. 04/28/14   Corwin Levins, MD  EPINEPHrine (EPI-PEN) 0.3 mg/0.3 mL DEVI Inject 0.3 mLs (0.3 mg total) into the muscle daily as needed (Anaphylaxis). 05/27/15   Corwin Levins, MD  hydrochlorothiazide (MICROZIDE) 12.5 MG capsule Take 1 capsule (12.5 mg total) by mouth daily. 09/20/15   Corwin Levins, MD  Rivaroxaban Carlena Hurl) 15  MG TABS tablet Take 1 tablet (15 mg total) by mouth 2 (two) times daily with a meal. 10/19/16   Sherryll Burger, Pratik D, DO  rivaroxaban (XARELTO) 20 MG TABS tablet Take 1 tablet (20 mg total) by mouth daily with supper. 11/09/16   Sherryll Burger, Pratik D, DO  traMADol (ULTRAM) 50 MG tablet Take 1 tablet (50 mg total) by mouth every 6 (six) hours as needed. 04/27/15   Corwin Levins, MD  triamcinolone (NASACORT AQ) 55 MCG/ACT AERO nasal inhaler Place 2 sprays into the nose daily. Patient not taking: Reported on 10/17/2016 04/28/14   Corwin Levins, MD    Physical Exam: Vitals:   02/26/17 1500 02/26/17 1530 02/26/17 1551 02/26/17 1721  BP: 129/90 138/86 126/81 128/83  Pulse: (!) 56 (!) 58 (!) 51 (!) 59  Resp: 10 15 18 20   Temp:    98 F (36.7 C)  TempSrc:    Oral  SpO2: 100% 100% 100% 100%  Weight:    123.1 kg  (271 lb 6.2 oz)  Height:    5\' 7"  (1.702 m)    Constitutional: NAD, calm, comfortable Vitals:   02/26/17 1500 02/26/17 1530 02/26/17 1551 02/26/17 1721  BP: 129/90 138/86 126/81 128/83  Pulse: (!) 56 (!) 58 (!) 51 (!) 59  Resp: 10 15 18 20   Temp:    98 F (36.7 C)  TempSrc:    Oral  SpO2: 100% 100% 100% 100%  Weight:    123.1 kg (271 lb 6.2 oz)  Height:    5\' 7"  (1.702 m)   Eyes: PERRL, lids and conjunctivae normal ENMT: Mucous membranes are moist. Posterior pharynx clear of any exudate or lesions.Normal dentition.  Respiratory: clear to auscultation bilaterally, no wheezing, no crackles. Normal respiratory effort. No accessory muscle use.  Cardiovascular: Regular rate and rhythm, no murmurs / rubs / gallops. No extremity edema. 2+ pedal pulses. No carotid bruits.  Abdomen: no tenderness, no masses palpated. No hepatosplenomegaly. Bowel sounds positive.  Musculoskeletal: no clubbing / cyanosis Skin: no rashes, lesions, ulcers. No induration Neurologic: CN 2-12 grossly intact. Psychiatric: Normal judgment and insight. Alert and oriented x 3. Normal mood.    Labs on Admission: I have personally reviewed following labs and imaging studies  CBC: Recent Labs  Lab 02/26/17 1023  WBC 3.9*  NEUTROABS 1.7  HGB 12.0  HCT 36.0  MCV 83.3  PLT 206   Basic Metabolic Panel: Recent Labs  Lab 02/26/17 1023  NA 140  K 3.9  CL 108  CO2 24  GLUCOSE 96  BUN 9  CREATININE 0.69  CALCIUM 9.1   GFR: Estimated Creatinine Clearance: 119.6 mL/min (by C-G formula based on SCr of 0.69 mg/dL). Liver Function Tests: Recent Labs  Lab 02/26/17 1023  AST 19  ALT 13*  ALKPHOS 60  BILITOT 0.4  PROT 7.6  ALBUMIN 3.9   No results for input(s): LIPASE, AMYLASE in the last 168 hours. No results for input(s): AMMONIA in the last 168 hours. Coagulation Profile: Recent Labs  Lab 02/26/17 1023  INR 1.29   Cardiac Enzymes: Recent Labs  Lab 02/26/17 1024  TROPONINI <0.03   BNP (last  3 results) No results for input(s): PROBNP in the last 8760 hours. HbA1C: No results for input(s): HGBA1C in the last 72 hours. CBG: No results for input(s): GLUCAP in the last 168 hours. Lipid Profile: No results for input(s): CHOL, HDL, LDLCALC, TRIG, CHOLHDL, LDLDIRECT in the last 72 hours. Thyroid Function Tests: No results for input(s): TSH,  T4TOTAL, FREET4, T3FREE, THYROIDAB in the last 72 hours. Anemia Panel: No results for input(s): VITAMINB12, FOLATE, FERRITIN, TIBC, IRON, RETICCTPCT in the last 72 hours. Urine analysis:    Component Value Date/Time   COLORURINE YELLOW 09/04/2013 1028   APPEARANCEUR CLEAR 09/04/2013 1028   LABSPEC 1.007 09/04/2013 1028   PHURINE 7.0 09/04/2013 1028   GLUCOSEU NEGATIVE 09/04/2013 1028   GLUCOSEU NEGATIVE 06/16/2010 1659   HGBUR NEGATIVE 09/04/2013 1028   BILIRUBINUR NEGATIVE 09/04/2013 1028   KETONESUR NEGATIVE 09/04/2013 1028   PROTEINUR NEGATIVE 09/04/2013 1028   UROBILINOGEN 0.2 09/04/2013 1028   NITRITE NEGATIVE 09/04/2013 1028   LEUKOCYTESUR NEGATIVE 09/04/2013 1028    Radiological Exams on Admission: Dg Chest 2 View  Result Date: 02/26/2017 CLINICAL DATA:  Chest pain EXAM: CHEST  2 VIEW COMPARISON:  November 23, 2016 FINDINGS: There is no edema or consolidation. The heart size and pulmonary vascularity are normal. No adenopathy. No bone lesions. No pneumothorax. IMPRESSION: No edema or consolidation. Electronically Signed   By: Bretta BangWilliam  Woodruff III M.D.   On: 02/26/2017 10:27   Ct Angio Chest Pe W And/or Wo Contrast  Result Date: 02/26/2017 CLINICAL DATA:  Chest pain. Positive D-dimer. History of pulmonary embolus. EXAM: CT ANGIOGRAPHY CHEST WITH CONTRAST TECHNIQUE: Multidetector CT imaging of the chest was performed using the standard protocol during bolus administration of intravenous contrast. Multiplanar CT image reconstructions and MIPs were obtained to evaluate the vascular anatomy. CONTRAST:  100mL ISOVUE-370 IOPAMIDOL  (ISOVUE-370) INJECTION 76% COMPARISON:  CT angiogram of the chest dated 10/17/2016 and chest x-ray dated 02/26/2017 FINDINGS: Cardiovascular: There are small recurrent emboli in the posterosuperior aspect of the right upper lobe. No other pulmonary emboli. The pulmonary emboli seen elsewhere in the lungs on the prior study have resolved. Heart size is normal. Mediastinum/Nodes: No enlarged mediastinal, hilar, or axillary lymph nodes. Thyroid gland, trachea, and esophagus demonstrate no significant findings. Lungs/Pleura: Lungs are clear. No pleural effusion or pneumothorax. Upper Abdomen: Normal. Musculoskeletal: No chest wall abnormality. No acute or significant osseous findings. Review of the MIP images confirms the above findings. IMPRESSION: Recurrent small pulmonary emboli to the right upper lobe. Electronically Signed   By: Francene BoyersJames  Maxwell M.D.   On: 02/26/2017 12:12    EKG: Independently reviewed.  EKG shows sinus rhythm with LVH  Assessment/Plan Active Problems:   Recurrent pulmonary embolism (HCC) The patient relates she has been taking her Xarelto religiously, we will go ahead and start her on Lovenox and Coumadin per pharmacy, this means that she failed Xarelto as an outpatient. Hyper coag panel has already been order.. The patient in 2015 was on Coumadin and she never had a recurrent PE while on Coumadin. She denies any oral contraceptive use, she denies any tobacco abuse. She is uptodate on her mammography and papa smear, no family history of young sudden death or recurrent PE or no history of hypercoagulable state in her family. We will check an A1c and TSH.    DVT prophylaxis:  Code Status: lovenox Family Communication: none Disposition Plan:  Consults called: Hematology Admission status: inpatient   Marinda ElkAbraham Feliz Ortiz MD [facility] Pager [phone]  If 7PM-7AM, please contact night-coverage [email or web address] Password [email or web address]  02/26/2017, 5:43 PM

## 2017-02-27 ENCOUNTER — Inpatient Hospital Stay (HOSPITAL_COMMUNITY): Payer: BLUE CROSS/BLUE SHIELD

## 2017-02-27 DIAGNOSIS — I2699 Other pulmonary embolism without acute cor pulmonale: Secondary | ICD-10-CM

## 2017-02-27 DIAGNOSIS — I5032 Chronic diastolic (congestive) heart failure: Secondary | ICD-10-CM

## 2017-02-27 LAB — COMPREHENSIVE METABOLIC PANEL
ALK PHOS: 56 U/L (ref 38–126)
ALT: 11 U/L — ABNORMAL LOW (ref 14–54)
ANION GAP: 9 (ref 5–15)
AST: 17 U/L (ref 15–41)
Albumin: 3.5 g/dL (ref 3.5–5.0)
BILIRUBIN TOTAL: 0.4 mg/dL (ref 0.3–1.2)
BUN: 12 mg/dL (ref 6–20)
CALCIUM: 9 mg/dL (ref 8.9–10.3)
CO2: 23 mmol/L (ref 22–32)
Chloride: 106 mmol/L (ref 101–111)
Creatinine, Ser: 0.67 mg/dL (ref 0.44–1.00)
GFR calc Af Amer: >60 mL/min (ref >60)
Glucose, Bld: 106 mg/dL — ABNORMAL HIGH (ref 65–99)
POTASSIUM: 3.7 mmol/L (ref 3.5–5.1)
Sodium: 138 mmol/L (ref 135–145)
TOTAL PROTEIN: 7 g/dL (ref 6.5–8.1)

## 2017-02-27 LAB — TSH: TSH: 3.801 u[IU]/mL (ref 0.350–4.500)

## 2017-02-27 LAB — PROTIME-INR
INR: 1.01
Prothrombin Time: 13.3 seconds (ref 11.4–15.2)

## 2017-02-27 MED ORDER — ENOXAPARIN SODIUM 120 MG/0.8ML ~~LOC~~ SOLN: 120.0000 mg | Freq: Two times a day (BID) | SUBCUTANEOUS | 1 refills | Status: DC

## 2017-02-27 MED ORDER — COUMADIN BOOK
Freq: Once | Status: AC
  Administered 2017-02-27: 18:00:00
  Filled 2017-02-27: qty 1

## 2017-02-27 MED ORDER — WARFARIN SODIUM 5 MG PO TABS
10.0000 mg | ORAL_TABLET | Freq: Once | ORAL | Status: AC
  Administered 2017-02-27: 10 mg via ORAL
  Filled 2017-02-27: qty 2

## 2017-02-27 NOTE — Progress Notes (Signed)
ANTICOAGULATION CONSULT NOTE  Pharmacy Consult for Lovenox, Warfarin Indication: PE while on Xarelto  Allergies  Allergen Reactions  . Oxaprozin     angioedema  . Sulfonamide Derivatives     REACTION: hives  . Orange Juice [Orange Oil] Hives    Orange juice  . Tomato Hives    tomatoes  . Latex Rash  . Nsaids Rash    Patient Measurements: Height: 5\' 7"  (170.2 cm) Weight: 271 lb 6.2 oz (123.1 kg) IBW/kg (Calculated) : 61.6  Vital Signs: Temp: 97.6 F (36.4 C) (02/26 0703) Temp Source: Oral (02/26 0703) BP: 119/66 (02/26 0703) Pulse Rate: 55 (02/26 0703)  Labs: Recent Labs    02/26/17 1023 02/26/17 1024 02/27/17 0529  HGB 12.0  --   --   HCT 36.0  --   --   PLT 206  --   --   LABPROT 16.0*  --  13.3  INR 1.29  --  1.01  CREATININE 0.69  --  0.67  TROPONINI  --  <0.03  --     Estimated Creatinine Clearance: 119.6 mL/min (by C-G formula based on SCr of 0.67 mg/dL).   Medical History: Past Medical History:  Diagnosis Date  . ALLERGIC RHINITIS 03/17/2009  . Anemia   . ASTHMA 03/17/2009  . DVT (deep venous thrombosis) (HCC)   . ECZEMA 03/17/2009  . Headache(784.0) 06/21/2009   recurrent  . HYPERLIPIDEMIA 03/17/2009  . KELOID 06/21/2009  . Pulmonary emboli (HCC)   . VITAMIN B12 DEFICIENCY 03/18/2009    Medications:  Scheduled:  . enoxaparin (LOVENOX) injection  120 mg Subcutaneous Q12H  . hydrochlorothiazide  12.5 mg Oral Daily  . sodium chloride flush  3 mL Intravenous Q12H  . Warfarin - Pharmacist Dosing Inpatient   Does not apply q1800   Assessment: 47 y.o. female with PMH multiple PEs now on Xarelto and found to have recurrent small PE on 02/26/2017. Pharmacy consulted to dose Enoxaparin and Warfarin. Strong family Hx of hypercoagulability; HC panel in process.   After initial PE was placed on warfarin through Nov 2017 (2 yr tx). At time of discontinuation warfarin regimen was 5 mg daily except for 7.5 mg on Wed, although the majority of INRs were below  goal at that time.  Started on Xarelto for second PE in Oct 2018; currently on 20 mg/d.  Pt reports missing very few doses, she would forget a dose in the evening and then remember and take the next morning.   Today, 02/27/2017:  INR 1.01, subtherapeutic as expected after only first warfarin dose  CBC: WNL (2/25)  SCr: WNL, CrCl (N) ~100 ml/min  Diet ordered  No drug-drug interactions noted  Goal of Therapy: Anti-Xa level 0.6-1 units/ml 4hrs after LMWH dose given INR 2-3  Plan:  Lovenox 120 mg SQ q12 hr  Warfarin 10 mg PO x 1 tonight  Daily INR, SCr weekly while on Lovenox  Monitor for signs of bleeding or worsening thrombosis  Minimum recommended duration of LMWH-warfarin overlap is 5 days AND until at least 2 consecutive therapeutic INRs achieved   Lynann Beaverhristine  PharmD, BCPS Pager [phone] 02/27/2017 11:18 AM

## 2017-02-27 NOTE — Plan of Care (Signed)
  Progressing Clinical Measurements: Ability to maintain clinical measurements within normal limits will improve 02/27/2017 0844 - Progressing by , Alben SpittleMary E, RN Diagnostic test results will improve 02/27/2017 0844 - Progressing by , Alben SpittleMary E, RN Cardiovascular complication will be avoided 02/27/2017 0844 - Progressing by , Alben SpittleMary E, RN Activity: Risk for activity intolerance will decrease 02/27/2017 0844 - Progressing by , Alben SpittleMary E, RN Coping: Level of anxiety will decrease 02/27/2017 0844 - Progressing by , Alben SpittleMary E, RN Elimination: Will not experience complications related to bowel motility 02/27/2017 0844 - Progressing by , Alben SpittleMary E, RN Will not experience complications related to urinary retention 02/27/2017 0844 - Progressing by , Alben SpittleMary E, RN Pain Managment: General experience of comfort will improve 02/27/2017 0844 - Progressing by , Alben SpittleMary E, RN

## 2017-02-27 NOTE — Progress Notes (Signed)
Bilateral lower extremity venous duplex has been completed. Negative for DVT.  02/27/17 10:28 AM Olen CordialGreg  RVT

## 2017-02-27 NOTE — Care Management Note (Signed)
Case Management Note  Patient Details  Name: Hannah Thompson MRN: [id number] Date of Birth: 1970-02-01  Subjective/Objective: 47 y/o f admitted w/Recurrent PE. From home. Benefit checked for lovenox/generic-$618-provided w/goodrx discount coupon-[facility] $90.25(patient can afford). No further CM needs.                   Action/Plan:d/c plan home.   Expected Discharge Date:  02/27/17               Expected Discharge Plan:  Home/Self Care  In-House Referral:     Discharge planning Services  CM Consult  Post Acute Care Choice:    Choice offered to:     DME Arranged:    DME Agency:     HH Arranged:    HH Agency:     Status of Service:  Completed, signed off  If discussed at [employer] Length of Stay Meetings, dates discussed:    Additional Comments:  Lanier ClamMahabir, , RN 02/27/2017, 12:57 PM

## 2017-02-27 NOTE — Discharge Instructions (Addendum)
Information on my medicine - Pradaxa (dabigatran)  This medication education was reviewed with me or my healthcare representative as part of my discharge preparation.  The pharmacist that spoke with me during my hospital stay was:  [place] / Wynona CanesChristine Why was Pradaxa prescribed for you? Pradaxa was prescribed to treat blood clots that may have been found in the veins of your legs (deep vein thrombosis) or in your lungs (pulmonary embolism) and to reduce the risk of them occurring again.  Pradaxa will take the place of the injectable anticoagulant medication you have been receiving for this condition for the last 5-10 days.  What do you Need to know about PradAXa? Take your Pradaxa TWICE DAILY - one capsule in the morning and one tablet in the evening with or without food.  It would be best to take the doses about the same time each day.  The capsules should not be broken, chewed or opened - they must be swallowed whole.  Do not store Pradaxa in other medication containers - once the bottle is opened the Pradaxa should be used within FOUR months; throw away any capsules that havent been by that time.  Take Pradaxa exactly as prescribed by your doctor.  DO NOT stop taking Pradaxa without talking to the doctor who prescribed the medication.  Refill your prescription before you run out.  After discharge, you should have regular check-up appointments with your healthcare provider that is prescribing your Pradaxa.  In the future your dose may need to be changed if your kidney function or weight changes by a significant amount.  What do you do if you miss a dose? If you miss a dose, take it as soon as you remember on the same day.  If your next dose is less than 6 hours away, skip the missed dose.  Do not take two doses of PRADAXA at the same time.  Important Safety Information A possible side effect of Pradaxa is bleeding. You should call your healthcare provider right away if you  experience any of the following: ? Bleeding from an injury or your nose that does not stop. ? Unusual colored urine (red or dark brown) or unusual colored stools (red or black). ? Unusual bruising for unknown reasons. ? A serious fall or if you hit your head (even if there is no bleeding).  Some medicines may interact with Pradaxa and might increase your risk of bleeding or clotting while on Pradaxa. To help avoid this, consult your healthcare provider or pharmacist prior to using any new prescription or non-prescription medications, including herbals, vitamins, non-steroidal anti-inflammatory drugs (NSAIDs) and supplements.  This website has more information on Pradaxa (dabigatran): [email or web address].

## 2017-02-27 NOTE — Plan of Care (Signed)
  Clinical Measurements: Diagnostic test results will improve 02/27/2017 2147 - Progressing by Herbert PunAddison,  Y, RN

## 2017-02-27 NOTE — Progress Notes (Signed)
PROGRESS NOTE  Hannah Thompson [id number]:[id number] DOB: 1970-04-03 DOA: 02/26/2017 PCP: Corwin Levins, MD  HPI/Recap of past 24 hours:  No hypoxia, pleuritic chest pain is resolved No cough, no fever  Assessment/Plan: Active Problems:   Recurrent pulmonary embolism (HCC)   Recurrent pulmonary emboli (HCC)  Recurrent PE, failed Xarelto -CTA "Recurrent small pulmonary emboli to the right upper lobe" -Hemodynamically stable, troponin negative -Venous Doppler no DVT -Case discussed with hematology oncology Dr. Myna Hidalgo over the phone who agreed to Coumadin with Lovenox bridging -Dr. Myna Hidalgo will see patient tomorrow am on 2/27. -Likely DC home on Coumadin with Lovenox bridging.    Chronic diastolic CHF Euvolemic, home meds HCTZ   Morbid obesity Body mass index is 42.51 kg/m.  Encourage weight loss  Code Status: full  Family Communication: patient   Disposition Plan: home on 2/27    Consultants:  Case discussed with Dr Myna Hidalgo  Procedures:  none  Antibiotics:  none   Objective: BP 119/66 (BP Location: Right Arm)   Pulse (!) 55   Temp 97.6 F (36.4 C) (Oral)   Resp 18   Ht 5\' 7"  (1.702 m)   Wt 123.1 kg (271 lb 6.2 oz)   LMP 02/19/2017   SpO2 100%   BMI 42.51 kg/m   Intake/Output Summary (Last 24 hours) at 02/27/2017 1045 Last data filed at 02/27/2017 0704 Gross per 24 hour  Intake 480 ml  Output -  Net 480 ml   Filed Weights   02/26/17 1009 02/26/17 1721  Weight: 124.3 kg (274 lb) 123.1 kg (271 lb 6.2 oz)    Exam: Patient is examined daily including today on 02/27/2017, exams remain the same as of yesterday except that has changed    General:  NAD. obese  Cardiovascular: RRR  Respiratory: CTABL  Abdomen: Soft/ND/NT, positive BS  Musculoskeletal: No Edema  Neuro: alert, oriented   Data Reviewed: Basic Metabolic Panel: Recent Labs  Lab 02/26/17 1023 02/27/17 0529  NA 140 138  K 3.9 3.7  CL 108 106  CO2 24 23  GLUCOSE 96 106*  BUN  9 12  CREATININE 0.69 0.67  CALCIUM 9.1 9.0   Liver Function Tests: Recent Labs  Lab 02/26/17 1023 02/27/17 0529  AST 19 17  ALT 13* 11*  ALKPHOS 60 56  BILITOT 0.4 0.4  PROT 7.6 7.0  ALBUMIN 3.9 3.5   No results for input(s): LIPASE, AMYLASE in the last 168 hours. No results for input(s): AMMONIA in the last 168 hours. CBC: Recent Labs  Lab 02/26/17 1023  WBC 3.9*  NEUTROABS 1.7  HGB 12.0  HCT 36.0  MCV 83.3  PLT 206   Cardiac Enzymes:   Recent Labs  Lab 02/26/17 1024  TROPONINI <0.03   BNP (last 3 results) Recent Labs    10/17/16 1344  BNP 15.5    ProBNP (last 3 results) No results for input(s): PROBNP in the last 8760 hours.  CBG: No results for input(s): GLUCAP in the last 168 hours.  No results found for this or any previous visit (from the past 240 hour(s)).   Studies: Ct Angio Chest Pe W And/or Wo Contrast  Result Date: 02/26/2017 CLINICAL DATA:  Chest pain. Positive D-dimer. History of pulmonary embolus. EXAM: CT ANGIOGRAPHY CHEST WITH CONTRAST TECHNIQUE: Multidetector CT imaging of the chest was performed using the standard protocol during bolus administration of intravenous contrast. Multiplanar CT image reconstructions and MIPs were obtained to evaluate the vascular anatomy. CONTRAST:  ISOVUE-370 IOPAMIDOL (ISOVUE-370)  INJECTION 76% COMPARISON:  CT angiogram of the chest dated 10/17/2016 and chest x-ray dated 02/26/2017 FINDINGS: Cardiovascular: There are small recurrent emboli in the posterosuperior aspect of the right upper lobe. No other pulmonary emboli. The pulmonary emboli seen elsewhere in the lungs on the prior study have resolved. Heart size is normal. Mediastinum/Nodes: No enlarged mediastinal, hilar, or axillary lymph nodes. Thyroid gland, trachea, and esophagus demonstrate no significant findings. Lungs/Pleura: Lungs are clear. No pleural effusion or pneumothorax. Upper Abdomen: Normal. Musculoskeletal: No chest wall abnormality. No  acute or significant osseous findings. Review of the MIP images confirms the above findings. IMPRESSION: Recurrent small pulmonary emboli to the right upper lobe. Electronically Signed   By: Francene BoyersJames  Maxwell M.D.   On: 02/26/2017 12:12    Scheduled Meds: . enoxaparin (LOVENOX) injection  120 mg Subcutaneous Q12H  . hydrochlorothiazide  12.5 mg Oral Daily  . sodium chloride flush  3 mL Intravenous Q12H  . Warfarin - Pharmacist Dosing Inpatient   Does not apply q1800    Continuous Infusions: . sodium chloride       Time spent: 25mins I have personally reviewed and interpreted on  02/27/2017 daily labs, tele strips, imagings as discussed above under date review session and assessment and plans.  I reviewed all nursing notes, pharmacy notes,   vitals, pertinent old records  I have discussed plan of care as described above with RN , patient on 02/27/2017   Albertine GratesFang  MD, PhD  [facility] Pager [phone]. If 7PM-7AM, please contact night-coverage at [email or web address], password [email or web address] 02/27/2017, 10:45 AM  LOS: 1 day

## 2017-02-28 DIAGNOSIS — Z86711 Personal history of pulmonary embolism: Secondary | ICD-10-CM

## 2017-02-28 DIAGNOSIS — I2699 Other pulmonary embolism without acute cor pulmonale: Principal | ICD-10-CM

## 2017-02-28 DIAGNOSIS — Z7901 Long term (current) use of anticoagulants: Secondary | ICD-10-CM

## 2017-02-28 LAB — BASIC METABOLIC PANEL
ANION GAP: 9 (ref 5–15)
BUN: 13 mg/dL (ref 6–20)
CALCIUM: 9.3 mg/dL (ref 8.9–10.3)
CO2: 26 mmol/L (ref 22–32)
Chloride: 104 mmol/L (ref 101–111)
Creatinine, Ser: 0.72 mg/dL (ref 0.44–1.00)
GFR calc Af Amer: >60 mL/min (ref >60)
GLUCOSE: 111 mg/dL — AB (ref 65–99)
Potassium: 3.9 mmol/L (ref 3.5–5.1)
Sodium: 139 mmol/L (ref 135–145)

## 2017-02-28 LAB — CBC WITH DIFFERENTIAL/PLATELET
BASOS ABS: 0 10*3/uL (ref 0.0–0.1)
BASOS PCT: 0 %
EOS PCT: 8 %
Eosinophils Absolute: 0.3 10*3/uL (ref 0.0–0.7)
HCT: 36.6 % (ref 36.0–46.0)
Hemoglobin: 11.9 g/dL — ABNORMAL LOW (ref 12.0–15.0)
LYMPHS PCT: 56 %
Lymphs Abs: 2.2 10*3/uL (ref 0.7–4.0)
MCH: 27.9 pg (ref 26.0–34.0)
MCHC: 32.5 g/dL (ref 30.0–36.0)
MCV: 85.7 fL (ref 78.0–100.0)
MONO ABS: 0.3 10*3/uL (ref 0.1–1.0)
Monocytes Relative: 7 %
Neutro Abs: 1.1 10*3/uL — ABNORMAL LOW (ref 1.7–7.7)
Neutrophils Relative %: 29 %
PLATELETS: 225 10*3/uL (ref 150–400)
RBC: 4.27 MIL/uL (ref 3.87–5.11)
RDW: 15.1 % (ref 11.5–15.5)
WBC: 3.9 10*3/uL — ABNORMAL LOW (ref 4.0–10.5)

## 2017-02-28 LAB — ANTITHROMBIN III: ANTITHROMB III FUNC: 102 % (ref 75–120)

## 2017-02-28 LAB — HEMOGLOBIN A1C
HEMOGLOBIN A1C: 6.1 % — AB (ref 4.8–5.6)
MEAN PLASMA GLUCOSE: 128 mg/dL

## 2017-02-28 LAB — PROTIME-INR
INR: 1.07
Prothrombin Time: 13.8 seconds (ref 11.4–15.2)

## 2017-02-28 MED ORDER — DABIGATRAN ETEXILATE MESYLATE 150 MG PO CAPS: 150.0000 mg | ORAL_CAPSULE | Freq: Two times a day (BID) | ORAL | Status: DC

## 2017-02-28 MED ORDER — ZOLPIDEM TARTRATE 5 MG PO TABS
5.0000 mg | ORAL_TABLET | Freq: Every evening | ORAL | Status: DC | PRN
  Administered 2017-02-28: 5 mg via ORAL
  Filled 2017-02-28: qty 1

## 2017-02-28 MED ORDER — DABIGATRAN ETEXILATE MESYLATE 150 MG PO CAPS
150.0000 mg | ORAL_CAPSULE | Freq: Two times a day (BID) | ORAL | Status: DC
  Filled 2017-02-28: qty 1

## 2017-02-28 MED ORDER — POLYETHYLENE GLYCOL 3350 17 G PO PACK: 17.0000 g | PACK | Freq: Every day | ORAL | 0 refills | Status: DC | PRN

## 2017-02-28 MED ORDER — DABIGATRAN ETEXILATE MESYLATE 150 MG PO CAPS: 150.0000 mg | ORAL_CAPSULE | Freq: Two times a day (BID) | ORAL | 0 refills | Status: DC

## 2017-02-28 NOTE — Consult Note (Signed)
Referral MD  Reason for Referral: Recurrent pulmonary embolism while on Xarelto  Chief Complaint  Patient presents with  . Chest Pain  : I had another blood clot.  HPI: Hannah Thompson is well-known to me.  She is a very nice 47 year old African-American female.  She has history of recurrent pulmonary embolism.  Her last one was in October 2018.  At that time, she been on Xarelto 10 mg a day because she could not afford the 20 mg dose.  She says that she is taking her medicine.  She may have missed a day here and there.  She was admitted on the 25th.  She had chest pain.  She has some shortness of breath.  A CT scan was done which showed another pulmonary embolism.  This was in the right upper lobe.  Back in October, she had bilateral pulmonary emboli.  She is on Lovenox right now.  She apparently has been transitioned over to Coumadin.  She does not want to be on Coumadin.  She has been on Coumadin before.  She is had no problems with bleeding.  She is had no issues with bowels or bladder.  She has had no nausea or vomiting.  She is had a little bit of chest pain.  She had some shortness of breath.  She is had no hemoptysis.  That there is been no fever.  She has had no leg swelling.  She did have a Doppler of her legs.  This was negative.  Her labs look okay.  Her creatinine is 0.72.  Her white cell count is 3.9.  Platelet count 225K.  Her hemoglobin is 11.9.  She is still working.  Overall, her performance status is ECOG 1.   Past Medical History:  Diagnosis Date  . ALLERGIC RHINITIS 03/17/2009  . Anemia   . ASTHMA 03/17/2009  . DVT (deep venous thrombosis) (HCC)   . ECZEMA 03/17/2009  . Headache(784.0) 06/21/2009   recurrent  . HYPERLIPIDEMIA 03/17/2009  . KELOID 06/21/2009  . Pulmonary emboli (HCC)   . VITAMIN B12 DEFICIENCY 03/18/2009  :  Past Surgical History:  Procedure Laterality Date  . KELOID EXCISION  mid 1990's   s/p keloid removal-laser   :   Current  Facility-Administered Medications:  .  0.9 %  sodium chloride infusion, 250 mL, Intravenous, PRN, Marinda Elk, MD .  acetaminophen (TYLENOL) tablet 650 mg, 650 mg, Oral, Q6H PRN, 650 mg at 02/27/17 2110 **OR** acetaminophen (TYLENOL) suppository 650 mg, 650 mg, Rectal, Q6H PRN, Marinda Elk, MD .  enoxaparin (LOVENOX) injection 120 mg, 120 mg, Subcutaneous, Q12H, Danford Bad, RPH, 120 mg at 02/28/17 0609 .  EPINEPHrine (EPI-PEN) injection 0.3 mg, 0.3 mg, Intramuscular, Daily PRN, Marinda Elk, MD .  hydrochlorothiazide (MICROZIDE) capsule 12.5 mg, 12.5 mg, Oral, Daily, Marinda Elk, MD, 12.5 mg at 02/27/17 1610 .  polyethylene glycol (MIRALAX / GLYCOLAX) packet 17 g, 17 g, Oral, Daily PRN, Marinda Elk, MD .  sodium chloride flush (NS) 0.9 % injection 3 mL, 3 mL, Intravenous, Q12H, Marinda Elk, MD, 3 mL at 02/27/17 2114 .  sodium chloride flush (NS) 0.9 % injection 3 mL, 3 mL, Intravenous, PRN, Marinda Elk, MD .  zolpidem [facility]) tablet 5 mg, 5 mg, Oral, QHS PRN, Leda Gauze, NP, 5 mg at 02/28/17 0128:  . enoxaparin (LOVENOX) injection  120 mg Subcutaneous Q12H  . hydrochlorothiazide  12.5 mg Oral Daily  . sodium chloride flush  3 mL Intravenous Q12H  :  Allergies  Allergen Reactions  . Oxaprozin     angioedema  . Sulfonamide Derivatives     REACTION: hives  . Orange Juice [Orange Oil] Hives    Orange juice  . Tomato Hives    tomatoes  . Latex Rash  . Nsaids Rash  :  Family History  Problem Relation Age of Onset  . Cancer Father        Prostate Cancer  . Hypertension Father   . Allergies Sister   . Cancer Paternal Aunt        Ovarian Cancer  . Cancer Other        Grandparent-Lung Cancer  . Diabetes Other        Grandparent  . Heart disease Other        Grandparent  . Alcohol abuse Other        Several on both sides of family-3 uncles  . Hypertension Mother   . Diabetes Mother   . Deep vein  thrombosis Sister   :  Social History   Socioeconomic History  . Marital status: Single    Spouse name: Not on file  . Number of children: 3  . Years of education: Not on file  . Highest education level: Not on file  Social Needs  . Financial resource strain: Not on file  . Food insecurity - worry: Not on file  . Food insecurity - inability: Not on file  . Transportation needs - medical: Not on file  . Transportation needs - non-medical: Not on file  Occupational History  . Occupation: Hair stylist  Tobacco Use  . Smoking status: Former Smoker    Packs/day: 0.25    Years: 4.00    Pack years: 1.00    Types: Cigarettes    Start date: 03/17/2004    Last attempt to quit: 04/17/2008    Years since quitting: 8.8  . Smokeless tobacco: Never Used  . Tobacco comment: quit 5 years ago  Substance and Sexual Activity  . Alcohol use: Yes    Alcohol/week: 0.0 oz    Comment: socially  . Drug use: No  . Sexual activity: Not on file  Other Topics Concern  . Not on file  Social History Narrative  . Not on file  :  Pertinent items are noted in HPI.  Exam: As above Patient Vitals for the past 24 hrs:  BP Temp Temp src Pulse Resp SpO2  02/28/17 0544 93/61 98.2 F (36.8 C) Oral 74 20 100 %  02/27/17 2127 116/74 97.9 F (36.6 C) Oral (!) 58 20 100 %  02/27/17 1500 (!) 106/57 97.8 F (36.6 C) Oral 60 20 97 %     Recent Labs    02/26/17 1023 02/28/17 0505  WBC 3.9* 3.9*  HGB 12.0 11.9*  HCT 36.0 36.6  PLT 206 225   Recent Labs    02/27/17 0529 02/28/17 0505  NA 138 139  K 3.7 3.9  CL 106 104  CO2 23 26  GLUCOSE 106* 111*  BUN 12 13  CREATININE 0.67 0.72  CALCIUM 9.0 9.3    Blood smear review: None  Pathology: None    Assessment and Plan: Hannah Thompson is a 47 year old African-American female.  She has history of recurrent thromboembolic disease.  She has been tested for hypercoagulable conditions.  Everything is been negative.  I just wonder if this blood clot  that is seen on this CT angiogram is a residual from  what she had back in October.  We really need to have the radiologist look at the 2 scans.  I really hate to have her get on Coumadin.  I think this will be incredibly difficult to manage.  She has been on Coumadin before and developed a thromboembolic disease.  I will try her on Pradaxa.  Pradaxa is a direct thrombin inhibitor.  This is different than Xarelto or Eliquis.  These 2 are Factor Xa inhibitors.  I does think that Pradaxa would be reasonable.  I just really question whether or not she has a new blood clot in the lung.  I appreciate all the great care that she is getting from everybody up on 4 E.  We will help follow her along.   Christin Bach, MD  Duwayne Heck 41:10

## 2017-02-28 NOTE — Progress Notes (Signed)
Per the night nurse patient heart rate dropped to the 40's. MD made aware.

## 2017-02-28 NOTE — Care Management Note (Signed)
Case Management Note  Patient Details  Name: Hannah Thompson MRN: [id number] Date of Birth: Dec 16, 1970  Subjective/Objective:  Benefit checked for Pradaxa-$80-patient voiced understanding-she can afford. No further CM needs.                  Action/Plan:d/c plan home.   Expected Discharge Date:  02/28/17               Expected Discharge Plan:  Home/Self Care  In-House Referral:     Discharge planning Services  CM Consult  Post Acute Care Choice:    Choice offered to:     DME Arranged:    DME Agency:     HH Arranged:    HH Agency:     Status of Service:  Completed, signed off  If discussed at [employer] Length of Stay Meetings, dates discussed:    Additional Comments:  Lanier ClamMahabir, , RN 02/28/2017, 12:17 PM

## 2017-02-28 NOTE — Progress Notes (Addendum)
ANTICOAGULATION CONSULT NOTE  Pharmacy Consult for Pradaxa Indication: recurrent PE   Allergies  Allergen Reactions  . Oxaprozin     angioedema  . Sulfonamide Derivatives     REACTION: hives  . Orange Juice [Orange Oil] Hives    Orange juice  . Tomato Hives    tomatoes  . Latex Rash  . Nsaids Rash    Patient Measurements: Height: 5\' 7"  (170.2 cm) Weight: 271 lb 6.2 oz (123.1 kg) IBW/kg (Calculated) : 61.6  Vital Signs: Temp: 98.2 F (36.8 C) (02/27 0544) Temp Source: Oral (02/27 0544) BP: 93/61 (02/27 0544) Pulse Rate: 74 (02/27 0544)  Labs: Recent Labs    02/26/17 1023 02/26/17 1024 02/27/17 0529 02/28/17 0505  HGB 12.0  --   --  11.9*  HCT 36.0  --   --  36.6  PLT 206  --   --  225  LABPROT 16.0*  --  13.3 13.8  INR 1.29  --  1.01 1.07  CREATININE 0.69  --  0.67 0.72  TROPONINI  --  <0.03  --   --     Estimated Creatinine Clearance: 119.6 mL/min (by C-G formula based on SCr of 0.72 mg/dL).   Medical History: Past Medical History:  Diagnosis Date  . ALLERGIC RHINITIS 03/17/2009  . Anemia   . ASTHMA 03/17/2009  . DVT (deep venous thrombosis) (HCC)   . ECZEMA 03/17/2009  . Headache(784.0) 06/21/2009   recurrent  . HYPERLIPIDEMIA 03/17/2009  . KELOID 06/21/2009  . Pulmonary emboli (HCC)   . VITAMIN B12 DEFICIENCY 03/18/2009    Medications:  Scheduled:  . dabigatran  150 mg Oral Q12H  . sodium chloride flush  3 mL Intravenous Q12H   Assessment: 47 y.o. female with PMH multiple PEs now on Xarelto and found to have recurrent small PE on 02/26/2017. Pharmacy consulted to dose Enoxaparin and Warfarin. Strong family Hx of hypercoagulability; HC panel in process.   After initial PE was placed on warfarin through Nov 2017 (2 yr tx). At time of discontinuation warfarin regimen was 5 mg daily except for 7.5 mg on Wed, although the majority of INRs were below goal at that time.  Started on Xarelto for second PE in Oct 2018; currently on 20 mg/d.  Pt reports  missing very few doses, she would forget a dose in the evening and then remember and take the next morning.   Today, 02/28/2017:  Pharmacy is now consulted to dose Pradaxa  INR 1.07, subtherapeutic as expected after only two doses of warfarin  CBC: Hgb 11.9, stable.  Plt WNL  SCr: WNL, CrCl (N) ~100 ml/min  Diet ordered  No drug-drug interactions noted  Plan:  Per MD, discontinue Lovenox (do not complete 5 days of initial therapy with a parenteral anticoagulant)  Begin Pradaxa 150mg  PO BID.  First dose on 2/27 ~ 12 hours after most recent Lovenox dose.  Monitor for signs of bleeding or worsening thrombosis  Lynann Beaverhristine  PharmD, BCPS Pager [phone] 02/28/2017 11:15 AM

## 2017-02-28 NOTE — Progress Notes (Signed)
Patient is being discharged home. Discharge instructions were given to patient and family 

## 2017-03-01 LAB — LUPUS ANTICOAGULANT PANEL
DRVVT: 41.5 s (ref 0.0–47.0)
PTT Lupus Anticoagulant: 49.6 s (ref 0.0–51.9)

## 2017-03-01 LAB — HOMOCYSTEINE: Homocysteine: 11.1 umol/L (ref 0.0–15.0)

## 2017-03-01 LAB — PROTEIN C, TOTAL: PROTEIN C, TOTAL: 87 % (ref 60–150)

## 2017-03-01 LAB — PROTEIN S ACTIVITY: Protein S Activity: 79 % (ref 63–140)

## 2017-03-01 LAB — PROTEIN S, TOTAL: PROTEIN S AG TOTAL: 114 % (ref 60–150)

## 2017-03-01 LAB — PROTEIN C ACTIVITY: PROTEIN C ACTIVITY: 123 % (ref 73–180)

## 2017-03-01 NOTE — Discharge Summary (Signed)
[facility] Discharge Summary   Patient: Hannah Thompson MVH:[id number]   PCP: Corwin Levins, MD DOB: 1970/07/20   Date of admission: 02/26/2017   Date of discharge: 02/28/2017   Discharge Diagnoses:  Active Problems:   Recurrent pulmonary embolism (HCC)   Recurrent pulmonary emboli (HCC)   Admitted From: home Disposition:  home  Recommendations for Outpatient Follow-up:  1. Please follow-up with PCP as well as oncology as recommended 2. Patient will need outpatient sleep study  Follow-up Information    Corwin Levins, MD. Schedule an appointment as soon as possible for a visit in 1 week(s).   Specialties:  Internal Medicine, Radiology Contact information: [address] Hannah Thompson [facility] [place] [address] [phone]        Hannah Macho, MD. Schedule an appointment as soon as possible for a visit in 1 month(s).   Specialty:  Oncology Contact information: [address] [place] [address] [phone]          Diet recommendation: Regular diet  Activity: The patient is advised to gradually reintroduce usual activities.  Discharge Condition: good  Code Status: full code  History of present illness: As per the H and P dictated on admission, " Hannah Thompson is a 47 y.o. female with medical history significant of of allergic rhinitis, not on oral contraceptive pills, essential hypertension and vitamin B12 deficiency history of DVT and PE back in 2017 which she completed her treatment and came back again and October 2018 was discharged on Xarelto comes into the hospital complaining of shortness of breath and sharp chest pain that started on the day of admission she relates that when she got to the ED it was resolved."  Hospital Course:  Summary of her active problems in the hospital is as following. Recurrent PE, failed Xarelto -CTA "Recurrent small pulmonary emboli to the right upper lobe" -Hemodynamically stable, troponin negative -Venous  Doppler no DVT -Case discussed with hematology oncology Dr. Myna Hidalgo -Initial plan was to switch the patient from Xarelto to Lovenox/Coumadin.  Later on hematology recommended that it would be prudent to switch the patient from Xarelto to Pradaxa. Case manager was consulted to ensure benefit check. As per my discussion with Dr. Myna Hidalgo, patient does not need the classic bridge of Lovenox for 5 days followed by Pradaxa as patient was already on chronic anticoagulation with Xarelto.  Chronic diastolic CHF Euvolemic, home meds HCTZ  Morbid obesity Body mass index is 42.51 kg/m.  Encourage weight loss  Needs sleep apnea assessment. Patient complains about daytime sleepiness, has grade 2 diastolic dysfunction as well as dilated RA and RV and has history of pedal edema for which she is on HCTZ. Recommending to hold HCTZ since her blood pressure is relatively soft and follow-up with PCP. Also recommend to get outpatient sleep study.  And had a sleep study in 2003 which showed mild apnea.  All other chronic medical condition were stable during the hospitalization.  Patient was ambulatory without any assistance. On the day of the discharge the patient's vitals were stable, and no other acute medical condition were reported by patient. the patient was felt safe to be discharge at home with family.  Procedures and Results:  none   Consultations:  Oncology  DISCHARGE MEDICATION: Allergies as of 02/28/2017      Reactions   Oxaprozin    angioedema   Sulfonamide Derivatives    REACTION: hives   Orange Juice [orange Oil] Hives  Orange juice   Tomato Hives   tomatoes   Latex Rash   Nsaids Rash      Medication List    STOP taking these medications   hydrochlorothiazide 12.5 MG capsule Commonly known as:  MICROZIDE   Rivaroxaban 15 MG Tabs tablet Commonly known as:  XARELTO   rivaroxaban 20 MG Tabs tablet Commonly known as:  XARELTO     TAKE these medications   acetaminophen  325 MG tablet Commonly known as:  TYLENOL Take 2 tablets (650 mg total) by mouth every 6 (six) hours as needed for mild pain, moderate pain or headache (or Fever >/= 101).   cetirizine 10 MG tablet Commonly known as:  ZYRTEC Take 1 tablet (10 mg total) by mouth daily.   dabigatran 150 MG Caps capsule Commonly known as:  PRADAXA Take 1 capsule (150 mg total) by mouth every 12 (twelve) hours.   EPINEPHrine 0.3 mg/0.3 mL Devi Commonly known as:  EPI-PEN Inject 0.3 mLs (0.3 mg total) into the muscle daily as needed (Anaphylaxis).   polyethylene glycol packet Commonly known as:  MIRALAX / GLYCOLAX Take 17 g by mouth daily as needed for mild constipation.   traMADol 50 MG tablet Commonly known as:  ULTRAM Take 1 tablet (50 mg total) by mouth every 6 (six) hours as needed.   triamcinolone 55 MCG/ACT Aero nasal inhaler Commonly known as:  NASACORT AQ Place 2 sprays into the nose daily.      Allergies  Allergen Reactions  . Oxaprozin     angioedema  . Sulfonamide Derivatives     REACTION: hives  . Orange Juice [Orange Oil] Hives    Orange juice  . Tomato Hives    tomatoes  . Latex Rash  . Nsaids Rash   Discharge Instructions    Ambulatory referral to Sleep Studies   Complete by:  As directed    Diet - low sodium heart healthy   Complete by:  As directed    Discharge instructions   Complete by:  As directed    It is important that you read following instructions as well as go over your medication list with RN to help you understand your care after this hospitalization.  Discharge Instructions: Please follow-up with PCP in one week  Please request your primary care physician to go over all Hospital Tests and Procedure/Radiological results at the follow up,  Please get all Hospital records sent to your PCP by signing hospital release before you go home.   Do not take more than prescribed Pain, Sleep and Anxiety Medications. You were cared for by a hospitalist during  your hospital stay. If you have any questions about your discharge medications or the care you received while you were in the hospital after you are discharged, you can call the unit and ask to speak with the hospitalist on call if the hospitalist that took care of you is not available.  Once you are discharged, your primary care physician will handle any further medical issues. Please note that NO REFILLS for any discharge medications will be authorized once you are discharged, as it is imperative that you return to your primary care physician (or establish a relationship with a primary care physician if you do not have one) for your aftercare needs so that they can reassess your need for medications and monitor your lab values. You Must read complete instructions/literature along with all the possible adverse reactions/side effects for all the Medicines you take and that have  been prescribed to you. Take any new Medicines after you have completely understood and accept all the possible adverse reactions/side effects. Wear Seat belts while driving. If you have smoked or chewed Tobacco in the last 2 yrs please stop smoking and/or stop any Recreational drug use.   Increase activity slowly   Complete by:  As directed      Discharge Exam: Filed Weights   02/26/17 1009 02/26/17 1721  Weight: 124.3 kg (274 lb) 123.1 kg (271 lb 6.2 oz)   Vitals:   02/28/17 0544 02/28/17 1339  BP: 93/61 117/83  Pulse: 74 99  Resp: 20 16  Temp: 98.2 F (36.8 C) 98.2 F (36.8 C)  SpO2: 100% 97%   General: Appear in no distress, no Rash; Oral Mucosa moist. Cardiovascular: S1 and S2 Present, no Murmur, no JVD Respiratory: Bilateral Air entry present and Clear to Auscultation, no Crackles, no wheezes Abdomen: Bowel Sound present, Soft and no tenderness Extremities: no Pedal edema, no calf tenderness Neurology: Grossly no focal neuro deficit.  The results of significant diagnostics from this hospitalization  (including imaging, microbiology, ancillary and laboratory) are listed below for reference.    Significant Diagnostic Studies: Dg Chest 2 View  Result Date: 02/26/2017 CLINICAL DATA:  Chest pain EXAM: CHEST  2 VIEW COMPARISON:  November 23, 2016 FINDINGS: There is no edema or consolidation. The heart size and pulmonary vascularity are normal. No adenopathy. No bone lesions. No pneumothorax. IMPRESSION: No edema or consolidation. Electronically Signed   By: Bretta BangWilliam  Woodruff III M.D.   On: 02/26/2017 10:27   Ct Angio Chest Pe W And/or Wo Contrast  Result Date: 02/26/2017 CLINICAL DATA:  Chest pain. Positive D-dimer. History of pulmonary embolus. EXAM: CT ANGIOGRAPHY CHEST WITH CONTRAST TECHNIQUE: Multidetector CT imaging of the chest was performed using the standard protocol during bolus administration of intravenous contrast. Multiplanar CT image reconstructions and MIPs were obtained to evaluate the vascular anatomy. CONTRAST:  100mL ISOVUE-370 IOPAMIDOL (ISOVUE-370) INJECTION 76% COMPARISON:  CT angiogram of the chest dated 10/17/2016 and chest x-ray dated 02/26/2017 FINDINGS: Cardiovascular: There are small recurrent emboli in the posterosuperior aspect of the right upper lobe. No other pulmonary emboli. The pulmonary emboli seen elsewhere in the lungs on the prior study have resolved. Heart size is normal. Mediastinum/Nodes: No enlarged mediastinal, hilar, or axillary lymph nodes. Thyroid gland, trachea, and esophagus demonstrate no significant findings. Lungs/Pleura: Lungs are clear. No pleural effusion or pneumothorax. Upper Abdomen: Normal. Musculoskeletal: No chest wall abnormality. No acute or significant osseous findings. Review of the MIP images confirms the above findings. IMPRESSION: Recurrent small pulmonary emboli to the right upper lobe. Electronically Signed   By: Francene BoyersJames  Maxwell M.D.   On: 02/26/2017 12:12    Microbiology: No results found for this or any previous visit (from the past  240 hour(s)).   Labs: CBC: Recent Labs  Lab 02/26/17 1023 02/28/17 0505  WBC 3.9* 3.9*  NEUTROABS 1.7 1.1*  HGB 12.0 11.9*  HCT 36.0 36.6  MCV 83.3 85.7  PLT 206 225   Basic Metabolic Panel: Recent Labs  Lab 02/26/17 1023 02/27/17 0529 02/28/17 0505  NA 140 138 139  K 3.9 3.7 3.9  CL 108 106 104  CO2 24 23 26   GLUCOSE 96 106* 111*  BUN 9 12 13   CREATININE 0.69 0.67 0.72  CALCIUM 9.1 9.0 9.3   Liver Function Tests: Recent Labs  Lab 02/26/17 1023 02/27/17 0529  AST 19 17  ALT 13* 11*  ALKPHOS 60 56  BILITOT 0.4 0.4  PROT 7.6 7.0  ALBUMIN 3.9 3.5   No results for input(s): LIPASE, AMYLASE in the last 168 hours. No results for input(s): AMMONIA in the last 168 hours. Cardiac Enzymes: Recent Labs  Lab 02/26/17 1024  TROPONINI <0.03   BNP (last 3 results) Recent Labs    10/17/16 1344  BNP 15.5   CBG: No results for input(s): GLUCAP in the last 168 hours. Time spent: 35 minutes  Signed:  Lynden Oxford  [facility] 02/28/2017 , 8:43 AM

## 2017-03-02 ENCOUNTER — Telehealth: Payer: Self-pay | Admitting: *Deleted

## 2017-03-02 LAB — CARDIOLIPIN ANTIBODIES, IGG, IGM, IGA: Anticardiolipin IgG: <9 GPL U/mL (ref 0–14)

## 2017-03-02 LAB — BETA-2-GLYCOPROTEIN I ABS, IGG/M/A
Beta-2 Glyco I IgG: <9 GPI IgG units (ref 0–20)
Beta-2-Glycoprotein I IgA: <9 GPI IgA units (ref 0–25)
Beta-2-Glycoprotein I IgM: <9 GPI IgM units (ref 0–32)

## 2017-03-02 NOTE — Telephone Encounter (Signed)
Transition Care Management Follow-up Telephone Call   Date discharged? 02/28/17   How have you been since you were released from the hospital? Pt states she is doing fine   Do you understand why you were in the hospital? YES   Do you understand the discharge instructions? YES   Where were you discharged to? Home   Items Reviewed:  Medications reviewed: YES  Allergies reviewed: YES  Dietary changes reviewed: YES, heart healthy and low sodieum  Referrals reviewed: No referral needed   Functional Questionnaire:   Activities of Daily Living (ADLs):   She states she are independent in the following: ambulation, bathing and hygiene, feeding, continence, grooming, toileting and dressing States she doesn't require assistance    Any transportation issues/concerns?: NO   Any patient concerns? NO   Confirmed importance and date/time of follow-up visits scheduled YES, appt 03/08/17  Provider Appointment booked with Dr. Jonny RuizJohn  Confirmed with patient if condition begins to worsen call PCP or go to the ER.  Patient was given the office number and encouraged to call back with question or concerns.  : YES

## 2017-03-05 LAB — FACTOR 5 LEIDEN

## 2017-03-05 LAB — PROTHROMBIN GENE MUTATION

## 2017-03-08 ENCOUNTER — Encounter: Payer: Self-pay | Admitting: Internal Medicine

## 2017-03-08 ENCOUNTER — Ambulatory Visit: Payer: BLUE CROSS/BLUE SHIELD | Admitting: Internal Medicine

## 2017-03-08 VITALS — BP 118/72 | HR 65 | Temp 97.8°F | Ht 67.0 in | Wt 273.0 lb

## 2017-03-08 DIAGNOSIS — J4520 Mild intermittent asthma, uncomplicated: Secondary | ICD-10-CM | POA: Diagnosis not present

## 2017-03-08 DIAGNOSIS — I2699 Other pulmonary embolism without acute cor pulmonale: Secondary | ICD-10-CM | POA: Diagnosis not present

## 2017-03-08 DIAGNOSIS — I10 Essential (primary) hypertension: Secondary | ICD-10-CM | POA: Diagnosis not present

## 2017-03-08 NOTE — Progress Notes (Signed)
Subjective:    Patient ID: Hannah Thompson, female    DOB: 1970-08-10, 47 y.o.   MRN: [id number]  HPI   Here to f/u recent hospn d/c 2/27 with Recurrent PE, failed Xarelto -CTA "Recurrent small pulmonary emboli to the right upper lobe" -Hemodynamically stable,troponin negative -Venous Doppler no DVT -Case discussed with hematology oncology Hannah Thompson -Initial plan was to switch the patient from Xarelto to Lovenox/Coumadin.  Later on hematology recommended that it would be prudent to switch the patient from Xarelto to Pradaxa.   After discussion with Dr. Myna Thompson, patient does not need the classic bridge of Lovenox for 5 days followed by Pradaxa as patient was already on chronic anticoagulation with Xarelto Due f/u apr 29 for f/u CTA and hematology f/u.   Pt states leg swelling resolved.  Admits to some xarelto missed doses prior to PE.   No other interval change or new complaints Past Medical History:  Diagnosis Date  . ALLERGIC RHINITIS 03/17/2009  . Anemia   . ASTHMA 03/17/2009  . DVT (deep venous thrombosis) (HCC)   . ECZEMA 03/17/2009  . Headache(784.0) 06/21/2009   recurrent  . HYPERLIPIDEMIA 03/17/2009  . KELOID 06/21/2009  . Pulmonary emboli (HCC)   . VITAMIN B12 DEFICIENCY 03/18/2009   Past Surgical History:  Procedure Laterality Date  . KELOID EXCISION  mid 1990's   s/p keloid removal-laser     reports that she quit smoking about 8 years ago. Her smoking use included cigarettes. She started smoking about 12 years ago. She has a 1.00 pack-year smoking history. she has never used smokeless tobacco. She reports that she drinks alcohol. She reports that she does not use drugs. family history includes Alcohol abuse in her other; Allergies in her sister; Cancer in her father, other, and paternal aunt; Deep vein thrombosis in her sister; Diabetes in her mother and other; Heart disease in her other; Hypertension in her father and mother. Allergies  Allergen Reactions  . Oxaprozin     angioedema  . Sulfonamide Derivatives     REACTION: hives  . Orange Juice [Orange Oil] Hives    Orange juice  . Tomato Hives    tomatoes  . Latex Rash  . Nsaids Rash   Current Outpatient Medications on File Prior to Visit  Medication Sig Dispense Refill  . acetaminophen (TYLENOL) 325 MG tablet Take 2 tablets (650 mg total) by mouth every 6 (six) hours as needed for mild pain, moderate pain or headache (or Fever >/= 101).    . cetirizine (ZYRTEC) 10 MG tablet Take 1 tablet (10 mg total) by mouth daily. 30 tablet 11  . dabigatran (PRADAXA) 150 MG CAPS capsule Take 1 capsule (150 mg total) by mouth every 12 (twelve) hours. 60 capsule 0  . EPINEPHrine (EPI-PEN) 0.3 mg/0.3 mL DEVI Inject 0.3 mLs (0.3 mg total) into the muscle daily as needed (Anaphylaxis). 2 Device 1  . polyethylene glycol (MIRALAX / GLYCOLAX) packet Take 17 g by mouth daily as needed for mild constipation. 14 each 0  . traMADol (ULTRAM) 50 MG tablet Take 1 tablet (50 mg total) by mouth every 6 (six) hours as needed. 60 tablet 1  . triamcinolone (NASACORT AQ) 55 MCG/ACT AERO nasal inhaler Place 2 sprays into the nose daily. 1 Inhaler 12   No current facility-administered medications on file prior to visit.    Review of Systems  Constitutional: Negative for other unusual diaphoresis or sweats HENT: Negative for ear discharge or swelling Eyes: Negative for other worsening  visual disturbances Respiratory: Negative for stridor or other swelling  Gastrointestinal: Negative for worsening distension or other blood Genitourinary: Negative for retention or other urinary change Musculoskeletal: Negative for other MSK pain or swelling Skin: Negative for color change or other new lesions Neurological: Negative for worsening tremors and other numbness  Psychiatric/Behavioral: Negative for worsening agitation or other fatigue\  All other system neg per pt    Objective:   Physical Exam BP 118/72   Pulse 65   Temp 97.8 F (36.6  C) (Oral)   Ht 5\' 7"  (1.702 m)   Wt 273 lb (123.8 kg)   LMP 02/19/2017   SpO2 96%   BMI 42.76 kg/m   VS noted,  Constitutional: Pt appears in NAD HENT: Head: NCAT.  Right Ear: External ear normal.  Left Ear: External ear normal.  Eyes: . Pupils are equal, round, and reactive to light. Conjunctivae and EOM are normal Nose: without d/c or deformity Neck: Neck supple. Gross normal ROM Cardiovascular: Normal rate and regular rhythm.   Pulmonary/Chest: Effort normal and breath sounds without rales or wheezing.  Abd:  Soft, NT, ND, + BS, no organomegaly Neurological: Pt is alert. At baseline orientation, motor grossly intact Skin: Skin is warm. No rashes, other new lesions, no LE edema Psychiatric: Pt behavior is normal without agitation  No other exam findings    Assessment & Plan:

## 2017-03-08 NOTE — Patient Instructions (Signed)
Please continue all other medications as before, and refills have been done if requested.  Please have the pharmacy call with any other refills you may need.  Please continue your efforts at being more active, low cholesterol diet, and weight control.  You are otherwise up to date with prevention measures today.  Please keep your appointments with your specialists as you may have planned - Oncology on Apr 29 with CT scan and Dr Myna HidalgoEnnever

## 2017-03-09 ENCOUNTER — Other Ambulatory Visit: Payer: Self-pay | Admitting: Internal Medicine

## 2017-03-09 DIAGNOSIS — E669 Obesity, unspecified: Secondary | ICD-10-CM

## 2017-03-09 DIAGNOSIS — E785 Hyperlipidemia, unspecified: Secondary | ICD-10-CM

## 2017-03-11 NOTE — Assessment & Plan Note (Signed)
stable overall by history and exam, recent data reviewed with pt, and pt to continue medical treatment as before,  to f/u any worsening symptoms or concerns BP Readings from Last 3 Encounters:  03/08/17 118/72  02/28/17 117/83  01/25/17 123/79

## 2017-03-11 NOTE — Assessment & Plan Note (Signed)
stable overall by history and exam, recent data reviewed with pt, and pt to continue medical treatment as before,  to f/u any worsening symptoms or concerns, to f/u heme as planned

## 2017-03-11 NOTE — Assessment & Plan Note (Addendum)
stable overall by history and exam, and pt to continue medical treatment as before,  to f/u any worsening symptoms or concerns 

## 2017-03-20 ENCOUNTER — Ambulatory Visit: Payer: BLUE CROSS/BLUE SHIELD | Admitting: Registered"

## 2017-03-20 ENCOUNTER — Encounter: Payer: BLUE CROSS/BLUE SHIELD | Attending: Internal Medicine | Admitting: Dietician

## 2017-03-20 ENCOUNTER — Encounter: Payer: Self-pay | Admitting: Dietician

## 2017-03-20 DIAGNOSIS — E669 Obesity, unspecified: Secondary | ICD-10-CM | POA: Diagnosis present

## 2017-03-20 DIAGNOSIS — E785 Hyperlipidemia, unspecified: Secondary | ICD-10-CM | POA: Diagnosis present

## 2017-03-20 DIAGNOSIS — Z6841 Body Mass Index (BMI) 40.0 and over, adult: Secondary | ICD-10-CM

## 2017-03-20 DIAGNOSIS — R7303 Prediabetes: Secondary | ICD-10-CM

## 2017-03-20 NOTE — Patient Instructions (Addendum)
-  Consider getting your vitamin B-12 checked or taking a supplement (sublingual or dissolvable. -Consider getting your vitamin D checked.  Recommend 2000 units vitamin D daily. - Increased SNAP benefits at the [facility] [place] [employer] 7 am-noon Saturday.  -Stay hydrated.  Drink more water.  Aim for 8-10 cups water per day. -Avoid artificial sweeteners. -Avoid added sugar.  Consider simple meal planning. Consider meal prepping. Choose more vegetables.  Make it 1/2 you plate. Small portion of lean meat. Less processed. Consider healthier options when eating out. Avoid skipping meals.

## 2017-03-20 NOTE — Progress Notes (Signed)
Medical Nutrition Therapy:  Appt start time: 1430 end time:  1530.   Assessment:  Primary concerns today: Patient is here today alone.  She was referred for hyperlipidemia and obesity.  No cholesterol labs available since 2012 which were WNL.  Noted that A1C was 6.1% 02/27/17.  This meets criteria for prediabetes. Course of discussion will include healthy eating for whole body.  Patient would like to lose weight. History includes:  Recurrent pulmonary embolism and vitamin D deficiency.    Weight hx: 277 lbs today's weight and is her highest weight 119 lbs in high school Gained weight to 180 lbs on Depo.  Stopped this and lost weight. 140-150 lbs normal weight until children She states that she had maintained a healthy weight until after her children were born.  Always had a problem gaining weight and ate poorly "junk food" then probable post partum depression after son and ate more. Metabolism changes and continued to gain.  Patient lives with her children ages 5717, 6716, and 388.  They are all active in sports so eating on the run a lot.  She has to take them to work and school as well as sports.  Her children help her cook at times.   She does get some help from her ex-husband.  Her oldest son has had an increased A1C in the prediabetes range and has seen Karleen HampshireSpencer.  She is a Scientist, research (medical)hairstylist and skips meals often.  She gets SNAP.  Preferred Learning Style:   No preference indicated   Learning Readiness:   Ready   MEDICATIONS: see list   DIETARY INTAKE:  Avoided foods include rare snacks or sweets.  Likes fried food.  Fast food as it is convenient.  Dislikes most legumes. 24-hr recall:  B ( AM): skips or eats with the Pradaxa as she gets sick taking this on an empty stomach.  Sausage biscuit or chicken biscuit  Snk ( AM): none  L ( PM): skips OR burger and fries OR 1/2 [employer] salad Snk ( PM): rare fruit or chips D ( PM): McDonald's OR cooks salmon, vegetables, rice or instant potatoes Snk  ( PM): none Beverages: regular soda (12 ounces per day), water (2 bottles per day), occasional juice, occasional coffee with whole milk and a lot of sugar.  Usual physical activity: none due to time and health problems  Estimated energy needs: 1600-1800 calories 180-200 g carbohydrates 120-165 g protein 44-50 g fat  Progress Towards Goal(s):  In progress.   Nutritional Diagnosis:  NB-1.1 Food and nutrition-related knowledge deficit As related to balance of carbohydrates, protein, and fat.  As evidenced by diet hx .    Intervention:  Nutrition counseling and education on a balanced diet for increased health.  Recommended increased plant foods and decreased sugar, lean meats and more home cooked meals as able.  Consider getting your vitamin B-12 checked or taking a supplement (sublingual or dissolvable. -Consider getting your vitamin D checked.  Recommend 2000 units vitamin D daily. - [facility]NAP benefits at the [facility] [place] [employer] 7 am-noon Saturday.  -Stay hydrated.  Drink more water.  Aim for 8-10 cups water per day. -Avoid artificial sweeteners. -Avoid added sugar.  Consider simple meal planning. Consider meal prepping. Choose more vegetables.  Make it 1/2 you plate. Small portion of lean meat. Less processed. Consider healthier options when eating out. Avoid skipping meals.  Teaching Method Utilized:  Visual Auditory  Handouts given during visit include:  Healthier eating out  Breakfast ideas  My plate  Barriers to learning/adherence to lifestyle change: time, illness, money  Demonstrated degree of understanding via:  Teach Back   Monitoring/Evaluation:  Dietary intake, exercise, and body weight in 1 month(s).

## 2017-04-16 ENCOUNTER — Encounter: Payer: BLUE CROSS/BLUE SHIELD | Attending: Internal Medicine | Admitting: Dietician

## 2017-04-16 ENCOUNTER — Encounter: Payer: Self-pay | Admitting: Dietician

## 2017-04-16 DIAGNOSIS — R7303 Prediabetes: Secondary | ICD-10-CM

## 2017-04-16 DIAGNOSIS — E669 Obesity, unspecified: Secondary | ICD-10-CM | POA: Insufficient documentation

## 2017-04-16 DIAGNOSIS — E785 Hyperlipidemia, unspecified: Secondary | ICD-10-CM | POA: Diagnosis not present

## 2017-04-16 DIAGNOSIS — Z6841 Body Mass Index (BMI) 40.0 and over, adult: Secondary | ICD-10-CM

## 2017-04-16 NOTE — Progress Notes (Signed)
Medical Nutrition Therapy:  Appt start time: 1100 end time:  1135.Marland Kitchen. Patient was 2 hours late due confusion about her appointment time.  Assessment:  Primary concerns today: Patient is here today with her 46 yo son.  Since her last appointment, she has tried to eat out less and cook more healthfully at home and is trying to have something for breakfast..  She ran out of her Predaxa Friday and called for a refill.  She has not started to exercise as she states that she is on restriction due to the blood clot.  Her primary care physician suggested weight loss surgery and patient is considering. History includes:  Recurrent pulmonary embolism and vitamin D deficiency.  Her A1C was also 6.1% 02/27/17 and meets criteria for prediabetes.  Weight hx: 273 lbs today with a weight loss of 4 lbs since her last visit 03/20/17 weight of 277 lbs. 119 lbs in high school  Gained weight to 180 lbs on Depo.  Stopped this and lost weight. 140-150 lbs normal weight until children She states that she had maintained a healthy weight until after her children were born.  Always had a problem gaining weight and ate poorly "junk food" then probable post partum depression after son and ate more. Metabolism changes and continued to gain.  Patient lives with her children ages 5817, 4916, and 208.  They are all active in sports so eating on the run a lot.  She has to take them to work and school as well as sports.  Her children help her cook at times.   She does get some help from her ex-husband.  Her oldest son has had an increased A1C in the prediabetes range and has seen Karleen HampshireSpencer.  She is a Scientist, research (medical)hairstylist and skips meals often.  She gets SNAP.  MEDICATIONS: see list to include vitamin D  DIETARY INTAKE: She is allergic to oranges and tomatoes, dislikes legumes.  Aspartame gives her headaches.   Rare sweets and snacks.  Likes fried foods and history of a lot of fast food. 24-hr recall:  B (AM): skips or premier protein shake and  fruit or yogurt  Snk (AM) :none  L (PM): fish fillet sandwich  Snk (PM): none D (PM): salmon, instant mashed potatoes, green beans  Snk (PM): none Beverages: water, fruit punch, Regular Pepsi (reduced to 4 per week)  Recent physical activity: ADL's, light walking at the mall with her children  Estimated energy needs: 1600-1800 calories 180-200 g carbohydrates 120-165 g protein 44-55 g fat  Progress Towards Goal(s):  In progress.   Nutritional Diagnosis:  NB-1.1 Food and nutrition-related knowledge deficit As related to balance of carbohydrates, protein, fat.  As evidenced by diet hx and patient report.    Intervention:  Nutrition counseling and education on balanced meals for increased health continued.  Encouraged her in the changes that she has made.  Registered her for the B[facility]ric [facility] Information [facility].  Continue the changes that you have made:  Eating out less  Mindful eating  Avoiding skipping meals  Reducing your soda intake. Consider the items listed below that we discussed at your last visit. Call your MD about your blood thinner. Ask your MD about any exercise restrictions. You are signed up for the Weight Loss Surgery information class tomorrow.  04/17/17 [facility] Room 1 in education wing (old entrance)   5:45-7:30.  You are registered an also should get an e-mail.   Registration ID [id number]  Consider getting your vitamin B-12 checked or taking a supplement (sublingual or dissolvable. -Consider getting your vitamin D checked.  Recommend 2000 units vitamin D daily. - Increased SNAP benefits at the [facility] [employer] 7 am-noon Saturday.  -Stay hydrated.  Drink more water.  Aim for 8-10 cups water per day. -Avoid artificial sweeteners. -Avoid added sugar.  Consider simple meal planning. Consider meal prepping. Choose more vegetables.  Make it 1/2 you plate. Small portion of lean meat. Less  processed. Consider healthier options when eating out. Avoid skipping meals.  Handouts given during visit include:  1800 calorie sample meal plans from AND  Choose my [email or web address] sample meal plans  Meal plan card  Monitoring/Evaluation:  Dietary intake, exercise, and body weight in 1 month(s).

## 2017-04-16 NOTE — Patient Instructions (Addendum)
Continue the changes that you have made:  Eating out less  Mindful eating  Avoiding skipping meals  Reducing your soda intake. Consider the items listed below that we discussed at your last visit. Call your MD about your blood thinner. Ask your MD about any exercise restrictions. You are signed up for the Weight Loss Surgery information class tomorrow.  04/17/17 [facility] Room 1 in education wing (old entrance)   5:45-7:30.  You are registered an also should get an e-mail.   Registration ID [id number]   Consider getting your vitamin B-12 checked or taking a supplement (sublingual or dissolvable. -Consider getting your vitamin D checked.  Recommend 2000 units vitamin D daily. - Increased SNAP benefits at the [facility] [place] [employer] 7 am-noon Saturday.  -Stay hydrated.  Drink more water.  Aim for 8-10 cups water per day. -Avoid artificial sweeteners. -Avoid added sugar.  Consider simple meal planning. Consider meal prepping. Choose more vegetables.  Make it 1/2 you plate. Small portion of lean meat. Less processed. Consider healthier options when eating out. Avoid skipping meals.

## 2017-04-18 ENCOUNTER — Other Ambulatory Visit: Payer: Self-pay | Admitting: *Deleted

## 2017-04-18 DIAGNOSIS — I82491 Acute embolism and thrombosis of other specified deep vein of right lower extremity: Secondary | ICD-10-CM

## 2017-04-18 DIAGNOSIS — I2692 Saddle embolus of pulmonary artery without acute cor pulmonale: Secondary | ICD-10-CM

## 2017-04-18 MED ORDER — DABIGATRAN ETEXILATE MESYLATE 150 MG PO CAPS: 150.0000 mg | ORAL_CAPSULE | Freq: Two times a day (BID) | ORAL | 11 refills | Status: DC

## 2017-04-30 ENCOUNTER — Encounter (HOSPITAL_BASED_OUTPATIENT_CLINIC_OR_DEPARTMENT_OTHER): Payer: Self-pay

## 2017-04-30 ENCOUNTER — Inpatient Hospital Stay: Payer: BLUE CROSS/BLUE SHIELD | Attending: Hematology & Oncology | Admitting: Hematology & Oncology

## 2017-04-30 ENCOUNTER — Encounter: Payer: Self-pay | Admitting: Hematology & Oncology

## 2017-04-30 ENCOUNTER — Other Ambulatory Visit: Payer: Self-pay

## 2017-04-30 ENCOUNTER — Ambulatory Visit (HOSPITAL_BASED_OUTPATIENT_CLINIC_OR_DEPARTMENT_OTHER)
Admission: RE | Admit: 2017-04-30 | Discharge: 2017-04-30 | Disposition: A | Discharge status: Home / Self Care | Payer: BLUE CROSS/BLUE SHIELD | Source: Ambulatory Visit | Attending: Hematology & Oncology | Admitting: Hematology & Oncology

## 2017-04-30 ENCOUNTER — Inpatient Hospital Stay: Payer: BLUE CROSS/BLUE SHIELD

## 2017-04-30 DIAGNOSIS — I2601 Septic pulmonary embolism with acute cor pulmonale: Secondary | ICD-10-CM

## 2017-04-30 DIAGNOSIS — I2699 Other pulmonary embolism without acute cor pulmonale: Secondary | ICD-10-CM | POA: Insufficient documentation

## 2017-04-30 DIAGNOSIS — R918 Other nonspecific abnormal finding of lung field: Secondary | ICD-10-CM | POA: Insufficient documentation

## 2017-04-30 DIAGNOSIS — I82491 Acute embolism and thrombosis of other specified deep vein of right lower extremity: Secondary | ICD-10-CM

## 2017-04-30 DIAGNOSIS — I2692 Saddle embolus of pulmonary artery without acute cor pulmonale: Secondary | ICD-10-CM

## 2017-04-30 DIAGNOSIS — Z86711 Personal history of pulmonary embolism: Secondary | ICD-10-CM | POA: Insufficient documentation

## 2017-04-30 LAB — CMP (CANCER CENTER ONLY)
ALT: 20 U/L (ref 10–47)
ANION GAP: 4 — AB (ref 5–15)
AST: 20 U/L (ref 11–38)
Albumin: 3.4 g/dL — ABNORMAL LOW (ref 3.5–5.0)
Alkaline Phosphatase: 57 U/L (ref 26–84)
BILIRUBIN TOTAL: 0.6 mg/dL (ref 0.2–1.6)
BUN: 8 mg/dL (ref 7–22)
CALCIUM: 9.2 mg/dL (ref 8.0–10.3)
CO2: 29 mmol/L (ref 18–33)
Chloride: 106 mmol/L (ref 98–108)
Creatinine: 0.8 mg/dL (ref 0.60–1.20)
Glucose, Bld: 102 mg/dL (ref 73–118)
POTASSIUM: 3.7 mmol/L (ref 3.3–4.7)
Sodium: 139 mmol/L (ref 128–145)
TOTAL PROTEIN: 7 g/dL (ref 6.4–8.1)

## 2017-04-30 LAB — CBC WITH DIFFERENTIAL (CANCER CENTER ONLY)
BASOS ABS: 0 10*3/uL (ref 0.0–0.1)
Basophils Relative: 1 %
EOS PCT: 8 %
Eosinophils Absolute: 0.3 10*3/uL (ref 0.0–0.5)
HEMATOCRIT: 34.1 % — AB (ref 34.8–46.6)
Hemoglobin: 11.2 g/dL — ABNORMAL LOW (ref 11.6–15.9)
LYMPHS ABS: 1.8 10*3/uL (ref 0.9–3.3)
Lymphocytes Relative: 47 %
MCH: 28.1 pg (ref 26.0–34.0)
MCHC: 32.8 g/dL (ref 32.0–36.0)
MCV: 85.5 fL (ref 81.0–101.0)
MONO ABS: 0.3 10*3/uL (ref 0.1–0.9)
Monocytes Relative: 7 %
NEUTROS ABS: 1.4 10*3/uL — AB (ref 1.5–6.5)
Neutrophils Relative %: 37 %
Platelet Count: 175 10*3/uL (ref 145–400)
RBC: 3.99 MIL/uL (ref 3.70–5.32)
RDW: 14.7 % (ref 11.1–15.7)
WBC Count: 3.9 10*3/uL (ref 3.9–10.0)

## 2017-04-30 MED ORDER — DABIGATRAN ETEXILATE MESYLATE 110 MG PO CAPS: 110.0000 mg | ORAL_CAPSULE | Freq: Two times a day (BID) | ORAL | 6 refills | Status: DC

## 2017-04-30 MED ORDER — IOPAMIDOL (ISOVUE-370) INJECTION 76%
100.0000 mL | Freq: Once | INTRAVENOUS | Status: AC | PRN
  Administered 2017-04-30: 100 mL via INTRAVENOUS

## 2017-04-30 NOTE — Progress Notes (Signed)
Hematology and Oncology Follow Up Visit  LYNE KHURANA [id number] 1970/11/14 47 y.o. 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 Sliwinski is here today for follow-up.  She is doing fairly well.  She is a little stressed out because of her children.  When child will graduated high school and hopefully will go to college in the fall.  She is still not working.  She wants to go back to work.  I do not see any problems with her going back to work.  We have her on Pradaxa.  We did a CT angiogram of the chest today.  There is no evidence of pulmonary emboli in the chest.  I am going to put her on a "maintenance" dose of Pradaxa.  I think 110 mg p.o. twice daily dose would be reasonable.  She is had no bleeding.  Is been no change in bowel or bladder habits.  She is had no cough.  She is had no leg swelling.  Overall, her performance status is ECOG 1.     Medications:  Allergies as of 04/30/2017      Reactions   Oxaprozin    angioedema   Sulfonamide Derivatives    REACTION: hives   Orange Juice [orange Oil] Hives   Orange juice   Tomato Hives   tomatoes   Latex Rash   Nsaids Rash      Medication List        Accurate as of 04/30/17 11:56 AM. Always use your most recent med list.          acetaminophen 325 MG tablet Commonly known as:  TYLENOL Take 2 tablets (650 mg total) by mouth every 6 (six) hours as needed for mild pain, moderate pain or headache (or Fever >/= 101).   cetirizine 10 MG tablet Commonly known as:  ZYRTEC Take 1 tablet (10 mg total) by mouth daily.   dabigatran 150 MG Caps capsule Commonly known as:  PRADAXA Take 1 capsule (150 mg total) by mouth every 12 (twelve) hours.   EPINEPHrine 0.3 mg/0.3 mL Devi Commonly known as:  EPI-PEN Inject 0.3 mLs (0.3 mg total)  into the muscle daily as needed (Anaphylaxis).   polyethylene glycol packet Commonly known as:  MIRALAX / GLYCOLAX Take 17 g by mouth daily as needed for mild constipation.   traMADol 50 MG tablet Commonly known as:  ULTRAM Take 1 tablet (50 mg total) by mouth every 6 (six) hours as needed.   triamcinolone 55 MCG/ACT Aero nasal inhaler Commonly known as:  NASACORT AQ Place 2 sprays into the nose daily.       Allergies:  Allergies  Allergen Reactions  . Oxaprozin     angioedema  . Sulfonamide Derivatives     REACTION: hives  . Orange Juice [Orange Oil] Hives    Orange juice  . Tomato Hives    tomatoes  . Latex Rash  . Nsaids Rash    Past Medical History, Surgical history, Social history, and Family History were reviewed and updated.  Review of Systems: Review of Systems  Constitutional: Negative.   HENT: Negative.   Eyes: Negative.   Respiratory: Negative.   Cardiovascular: Negative.   Gastrointestinal: Negative.   Genitourinary: Negative.   Musculoskeletal: Negative.   Skin: Negative.   Neurological: Negative.   Endo/Heme/Allergies:  Negative.   Psychiatric/Behavioral: Negative.      Physical Exam:  weight is 275 lb 1.9 oz (124.8 kg). Her oral temperature is 97.9 F (36.6 C). Her blood pressure is 136/87 and her pulse is 52 (abnormal). Her respiration is 17 and oxygen saturation is 100%.   Wt Readings from Last 3 Encounters:  04/30/17 275 lb 1.9 oz (124.8 kg)  04/16/17 273 lb (123.8 kg)  03/20/17 277 lb (125.6 kg)    Physical Exam  Constitutional: She is oriented to person, place, and time.  HENT:  Head: Normocephalic and atraumatic.  Mouth/Throat: Oropharynx is clear and moist.  Eyes: Pupils are equal, round, and reactive to light. EOM are normal.  Neck: Normal range of motion.  Cardiovascular: Normal rate, regular rhythm and normal heart sounds.  Pulmonary/Chest: Effort normal and breath sounds normal.  Abdominal: Soft. Bowel sounds are normal.    Musculoskeletal: Normal range of motion. She exhibits no edema, tenderness or deformity.  Lymphadenopathy:    She has no cervical adenopathy.  Neurological: She is alert and oriented to person, place, and time.  Skin: Skin is warm and dry. No rash noted. No erythema.  Psychiatric: She has a normal mood and affect. Her behavior is normal. Judgment and thought content normal.  Vitals reviewed.   Lab Results  Component Value Date   WBC 3.9 04/30/2017   HGB 11.2 (L) 04/30/2017   HCT 34.1 (L) 04/30/2017   MCV 85.5 04/30/2017   PLT 175 04/30/2017   Lab Results  Component Value Date   FERRITIN 242 08/16/2015   IRON 76 08/16/2015   TIBC 251 08/16/2015   UIBC 175 08/16/2015   IRONPCTSAT 30 08/16/2015   Lab Results  Component Value Date   RBC 3.99 04/30/2017   No results found for: KPAFRELGTCHN, LAMBDASER, KAPLAMBRATIO No results found for: IGGSERUM, IGA, IGMSERUM No results found for: Marda Stalker, SPEI   Chemistry      Component Value Date/Time   NA 139 04/30/2017 1020   NA 139 10/23/2016 1306   NA 140 09/25/2016 1145   K 3.7 04/30/2017 1020   K 3.8 10/23/2016 1306   K 4.1 09/25/2016 1145   CL 106 04/30/2017 1020   CL 107 10/23/2016 1306   CO2 29 04/30/2017 1020   CO2 26 10/23/2016 1306   CO2 28 09/25/2016 1145   BUN 8 04/30/2017 1020   BUN 8 10/23/2016 1306   BUN 9.5 09/25/2016 1145   CREATININE 0.80 04/30/2017 1020   CREATININE 0.8 10/23/2016 1306   CREATININE 0.8 09/25/2016 1145      Component Value Date/Time   CALCIUM 9.2 04/30/2017 1020   CALCIUM 8.7 10/23/2016 1306   CALCIUM 9.6 09/25/2016 1145   ALKPHOS 57 04/30/2017 1020   ALKPHOS 67 10/23/2016 1306   ALKPHOS 72 09/25/2016 1145   AST 20 04/30/2017 1020   AST 17 09/25/2016 1145   ALT 20 04/30/2017 1020   ALT 26 10/23/2016 1306   ALT 11 09/25/2016 1145   BILITOT 0.6 04/30/2017 1020   BILITOT 0.26 09/25/2016 1145      Impression and Plan: Ms.  Geng is a pleasant 47yo African American female with significant past history of thrombus with receent recurrent PE with right heart strain.   We will see how she does with the Pradaxa.  I do not think that there will be any issues regarding this.  I told her that if she wants to consider weight reduction surgery, I would  not have a problem with this.  We may have to think about a temporary IVC filter for her.  For right now, I will plan to get her back in 6 months.   Josph Macho, MD 4/29/201911:56 AM

## 2017-05-21 ENCOUNTER — Encounter: Payer: BLUE CROSS/BLUE SHIELD | Attending: Internal Medicine | Admitting: Dietician

## 2017-05-21 DIAGNOSIS — E785 Hyperlipidemia, unspecified: Secondary | ICD-10-CM | POA: Insufficient documentation

## 2017-05-21 DIAGNOSIS — Z6841 Body Mass Index (BMI) 40.0 and over, adult: Secondary | ICD-10-CM

## 2017-05-21 DIAGNOSIS — E669 Obesity, unspecified: Secondary | ICD-10-CM | POA: Insufficient documentation

## 2017-05-21 NOTE — Patient Instructions (Addendum)
Resume thechanges that you have made in the past             Eating out less             Mindful eating             Avoiding skipping meals             Reducing your soda intake. Find some form of activity such as walking most days. You are signed up for the Weight Loss Surgery information class tomorrow.             06/05/17 [facility] Class Room 1 in education wing (old entrance)                         5:45-7:30.  You are registered an also should get an e-mail.                         Registration ID [id number].  Call [phone] if you need to reschedule.   Consider getting your vitamin B-12 checked or taking a supplement (sublingual or dissolvable. -Consider getting your vitamin D checked. Recommend 2000 units vitamin D daily. - Increased SNAP benefits at the [facility] [employer] 7 am-noon Saturday.  -Stay hydrated. Drink more water. Aim for 8-10 cups water per day. -Avoid artificial sweeteners.

## 2017-05-21 NOTE — Progress Notes (Signed)
Medical Nutrition Therapy:  Appt start time: 1130 end time:  1200.  Assessment:  Primary concerns today: Patient is here today alone.  She has continue to try to eat out less often but she states due to death of an uncle as well as increased responsibilities due to her son's graduation she has started drinking more regular Pepsi again as this is what she does when she gets stressed.  She states that her blood clot issue is stable.  She is allowed to walk.  She is taking her medication for this.  She has a history of vitamin D deficiency, and a lower normal vitamin B-12.  Her A1C was 6.1% 02/27/17 and meets criteria for prediabetes.  She has not started the vitamins back yet.  She was not able to go to the Bariatric Surgery meeting last month.  Weight hx: Weight 277 lbs today increased 4 lbs since last visit 04/16/17.   119 lbs in high school  Gained weight to 180 lbs on Depo. Stopped this and lost weight. 140-150 lbs normal weight until children She states that she had maintained a healthy weight until after her children were born. Always had a problem gaining weight and ate poorly "junk food" then probable post partum depression after son and ate more. Metabolism changes and continued to gain.  Patient lives with her children ages 49, 77, and 84.  They are all active in sports so eating on the run a lot.  She has to take them to work and school as well as sports.  Her children help her cook at times. She does get some help from her ex-husband.  Her oldest son has had an increased A1C in the prediabetes range and has seen Karleen Hampshire.  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  She is working to avoid skipping meals.  She gets SNAP.  MEDICATIONS: see list  DIETARY INTAKE: She is allergic to oranges and tomatoes, dislikes legumes.  Aspartame gives her headaches.  Rare sweets and snacks.  Likes fried foods and history of a lot of fast food.  Increased regular soda at times.  Has been  skipping meals but has been working to improve this. 24-hr recall:  B ( AM): leftover lasagne  Snk ( AM) :none  L ( PM): hotdog on bun and regular soda (at ball game) OR [employer] salad Snk ( PM): none D ( PM): 1/2 quarter pounder and fries, water OR healthier at home  Snk (PM): none Beverages: water, regular pepsi, fruit punch  Recent physical activity: ADL's and light walking  Estimated energy needs: 1600-1800 calories 180-200 g carbohydrates 120-165 g protein 44-55 g fat  Progress Towards Goal(s):  In progress.   Nutritional Diagnosis:  NB-1.1 Food and nutrition-related knowledge deficit As related to .  As evidenced by balance of carbohydrates, protein, and fat.    Intervention:  Nutrition counseling/education related to balanced meals for increased health continued.  Encouraged her to resume changes that she has made in the past.  Registered her for the Bariatric Surgery information meeting June 4. Resume thechanges that you have made in the past             Eating out less             Mindful eating             Avoiding skipping meals             Reducing your soda intake.  Find some form of activity such as walking most days. You are signed up for the Weight Loss Surgery information class tomorrow.             06/05/17 [facility] Class Room 1 in education wing (old entrance)                         5:45-7:30.  You are registered an also should get an e-mail.                         Registration ID [id number].  Call [phone] if you need to reschedule.   Consider getting your vitamin B-12 checked or taking a supplement (sublingual or dissolvable. -Consider getting your vitamin D checked. Recommend 2000 units vitamin D daily. - Increased SNAP benefits at the [facility] 7 am-noon Saturday.  -Stay hydrated. Drink more water. Aim for 8-10 cups water per day. -Avoid artificial sweeteners.   Monitoring/Evaluation:  Dietary intake, exercise,  label reading, and body weight in 1 month(s).

## 2017-06-25 ENCOUNTER — Ambulatory Visit: Payer: BLUE CROSS/BLUE SHIELD | Admitting: Dietician

## 2017-06-28 ENCOUNTER — Encounter: Payer: BLUE CROSS/BLUE SHIELD | Admitting: Internal Medicine

## 2017-07-16 ENCOUNTER — Ambulatory Visit: Payer: BLUE CROSS/BLUE SHIELD | Admitting: Dietician

## 2017-08-02 ENCOUNTER — Encounter: Payer: Medicaid Other | Admitting: Internal Medicine

## 2017-08-06 ENCOUNTER — Ambulatory Visit: Payer: Medicaid Other | Admitting: Dietician

## 2017-10-29 ENCOUNTER — Inpatient Hospital Stay: Payer: Medicaid Other

## 2017-10-29 ENCOUNTER — Inpatient Hospital Stay: Payer: Medicaid Other | Admitting: Family

## 2017-12-09 ENCOUNTER — Emergency Department (HOSPITAL_COMMUNITY): Payer: BLUE CROSS/BLUE SHIELD

## 2017-12-09 ENCOUNTER — Encounter (HOSPITAL_COMMUNITY): Payer: Self-pay | Admitting: Nurse Practitioner

## 2017-12-09 DIAGNOSIS — I2609 Other pulmonary embolism with acute cor pulmonale: Secondary | ICD-10-CM | POA: Diagnosis not present

## 2017-12-09 DIAGNOSIS — Z8041 Family history of malignant neoplasm of ovary: Secondary | ICD-10-CM

## 2017-12-09 DIAGNOSIS — I82443 Acute embolism and thrombosis of tibial vein, bilateral: Secondary | ICD-10-CM | POA: Diagnosis present

## 2017-12-09 DIAGNOSIS — Z7951 Long term (current) use of inhaled steroids: Secondary | ICD-10-CM

## 2017-12-09 DIAGNOSIS — Z833 Family history of diabetes mellitus: Secondary | ICD-10-CM

## 2017-12-09 DIAGNOSIS — Z886 Allergy status to analgesic agent status: Secondary | ICD-10-CM

## 2017-12-09 DIAGNOSIS — D649 Anemia, unspecified: Secondary | ICD-10-CM | POA: Diagnosis present

## 2017-12-09 DIAGNOSIS — Z881 Allergy status to other antibiotic agents status: Secondary | ICD-10-CM

## 2017-12-09 DIAGNOSIS — I82431 Acute embolism and thrombosis of right popliteal vein: Secondary | ICD-10-CM | POA: Diagnosis present

## 2017-12-09 DIAGNOSIS — Z91040 Latex allergy status: Secondary | ICD-10-CM

## 2017-12-09 DIAGNOSIS — J45909 Unspecified asthma, uncomplicated: Secondary | ICD-10-CM | POA: Diagnosis present

## 2017-12-09 DIAGNOSIS — I82411 Acute embolism and thrombosis of right femoral vein: Secondary | ICD-10-CM | POA: Diagnosis present

## 2017-12-09 DIAGNOSIS — E785 Hyperlipidemia, unspecified: Secondary | ICD-10-CM | POA: Diagnosis present

## 2017-12-09 DIAGNOSIS — Z86711 Personal history of pulmonary embolism: Secondary | ICD-10-CM

## 2017-12-09 DIAGNOSIS — Z882 Allergy status to sulfonamides status: Secondary | ICD-10-CM

## 2017-12-09 DIAGNOSIS — Z91018 Allergy to other foods: Secondary | ICD-10-CM

## 2017-12-09 DIAGNOSIS — Z8249 Family history of ischemic heart disease and other diseases of the circulatory system: Secondary | ICD-10-CM

## 2017-12-09 DIAGNOSIS — Z801 Family history of malignant neoplasm of trachea, bronchus and lung: Secondary | ICD-10-CM

## 2017-12-09 DIAGNOSIS — Z6841 Body Mass Index (BMI) 40.0 and over, adult: Secondary | ICD-10-CM

## 2017-12-09 DIAGNOSIS — L309 Dermatitis, unspecified: Secondary | ICD-10-CM | POA: Diagnosis present

## 2017-12-09 DIAGNOSIS — Z86718 Personal history of other venous thrombosis and embolism: Secondary | ICD-10-CM

## 2017-12-09 DIAGNOSIS — I82453 Acute embolism and thrombosis of peroneal vein, bilateral: Secondary | ICD-10-CM | POA: Diagnosis present

## 2017-12-09 DIAGNOSIS — Z87891 Personal history of nicotine dependence: Secondary | ICD-10-CM

## 2017-12-09 LAB — CBC
HCT: 36.6 % (ref 36.0–46.0)
Hemoglobin: 11.8 g/dL — ABNORMAL LOW (ref 12.0–15.0)
MCH: 27.8 pg (ref 26.0–34.0)
MCHC: 32.2 g/dL (ref 30.0–36.0)
MCV: 86.3 fL (ref 80.0–100.0)
Platelets: 164 10*3/uL (ref 150–400)
RBC: 4.24 MIL/uL (ref 3.87–5.11)
RDW: 14.6 % (ref 11.5–15.5)
WBC: 6.9 10*3/uL (ref 4.0–10.5)
nRBC: 0 % (ref 0.0–0.2)

## 2017-12-09 LAB — BASIC METABOLIC PANEL
ANION GAP: 9 (ref 5–15)
BUN: 15 mg/dL (ref 6–20)
CO2: 22 mmol/L (ref 22–32)
Calcium: 9.1 mg/dL (ref 8.9–10.3)
Chloride: 109 mmol/L (ref 98–111)
Creatinine, Ser: 0.69 mg/dL (ref 0.44–1.00)
GFR calc Af Amer: >60 mL/min (ref >60)
GFR calc non Af Amer: >60 mL/min (ref >60)
Glucose, Bld: 106 mg/dL — ABNORMAL HIGH (ref 70–99)
Potassium: 3.7 mmol/L (ref 3.5–5.1)
Sodium: 140 mmol/L (ref 135–145)

## 2017-12-09 LAB — I-STAT BETA HCG BLOOD, ED (NOT ORDERABLE): I-stat hCG, quantitative: <5 m[IU]/mL (ref <5)

## 2017-12-09 LAB — PROTIME-INR
INR: 1.01
Prothrombin Time: 13.2 seconds (ref 11.4–15.2)

## 2017-12-09 NOTE — ED Triage Notes (Signed)
Pt is c/o chest pain and shortness of breath that she seems to associate with with her hx of PEs and DVTs. Adds that she has been out of her Xarelto/anticoagulant.

## 2017-12-10 ENCOUNTER — Encounter (HOSPITAL_COMMUNITY): Payer: Self-pay

## 2017-12-10 ENCOUNTER — Emergency Department (HOSPITAL_COMMUNITY): Payer: BLUE CROSS/BLUE SHIELD

## 2017-12-10 ENCOUNTER — Inpatient Hospital Stay (HOSPITAL_COMMUNITY): Payer: BLUE CROSS/BLUE SHIELD

## 2017-12-10 ENCOUNTER — Inpatient Hospital Stay (HOSPITAL_COMMUNITY)
Admission: EM | Admit: 2017-12-10 | Discharge: 2017-12-13 | DRG: 175 | Disposition: A | Discharge status: Home / Self Care | Payer: BLUE CROSS/BLUE SHIELD | Attending: Internal Medicine | Admitting: Internal Medicine

## 2017-12-10 ENCOUNTER — Other Ambulatory Visit: Payer: Self-pay

## 2017-12-10 DIAGNOSIS — Z833 Family history of diabetes mellitus: Secondary | ICD-10-CM | POA: Diagnosis not present

## 2017-12-10 DIAGNOSIS — I2609 Other pulmonary embolism with acute cor pulmonale: Secondary | ICD-10-CM

## 2017-12-10 DIAGNOSIS — I82453 Acute embolism and thrombosis of peroneal vein, bilateral: Secondary | ICD-10-CM | POA: Diagnosis present

## 2017-12-10 DIAGNOSIS — Z87891 Personal history of nicotine dependence: Secondary | ICD-10-CM | POA: Diagnosis not present

## 2017-12-10 DIAGNOSIS — Z91040 Latex allergy status: Secondary | ICD-10-CM | POA: Diagnosis not present

## 2017-12-10 DIAGNOSIS — I2699 Other pulmonary embolism without acute cor pulmonale: Secondary | ICD-10-CM | POA: Diagnosis present

## 2017-12-10 DIAGNOSIS — Z8249 Family history of ischemic heart disease and other diseases of the circulatory system: Secondary | ICD-10-CM | POA: Diagnosis not present

## 2017-12-10 DIAGNOSIS — Z8041 Family history of malignant neoplasm of ovary: Secondary | ICD-10-CM | POA: Diagnosis not present

## 2017-12-10 DIAGNOSIS — E785 Hyperlipidemia, unspecified: Secondary | ICD-10-CM | POA: Diagnosis present

## 2017-12-10 DIAGNOSIS — I82431 Acute embolism and thrombosis of right popliteal vein: Secondary | ICD-10-CM | POA: Diagnosis present

## 2017-12-10 DIAGNOSIS — Z86718 Personal history of other venous thrombosis and embolism: Secondary | ICD-10-CM | POA: Diagnosis not present

## 2017-12-10 DIAGNOSIS — Z886 Allergy status to analgesic agent status: Secondary | ICD-10-CM | POA: Diagnosis not present

## 2017-12-10 DIAGNOSIS — Z86711 Personal history of pulmonary embolism: Secondary | ICD-10-CM | POA: Diagnosis not present

## 2017-12-10 DIAGNOSIS — I82443 Acute embolism and thrombosis of tibial vein, bilateral: Secondary | ICD-10-CM | POA: Diagnosis present

## 2017-12-10 DIAGNOSIS — Z7951 Long term (current) use of inhaled steroids: Secondary | ICD-10-CM | POA: Diagnosis not present

## 2017-12-10 DIAGNOSIS — Z882 Allergy status to sulfonamides status: Secondary | ICD-10-CM | POA: Diagnosis not present

## 2017-12-10 DIAGNOSIS — Z881 Allergy status to other antibiotic agents status: Secondary | ICD-10-CM | POA: Diagnosis not present

## 2017-12-10 DIAGNOSIS — L309 Dermatitis, unspecified: Secondary | ICD-10-CM | POA: Diagnosis present

## 2017-12-10 DIAGNOSIS — Z6841 Body Mass Index (BMI) 40.0 and over, adult: Secondary | ICD-10-CM | POA: Diagnosis not present

## 2017-12-10 DIAGNOSIS — Z801 Family history of malignant neoplasm of trachea, bronchus and lung: Secondary | ICD-10-CM | POA: Diagnosis not present

## 2017-12-10 DIAGNOSIS — I82411 Acute embolism and thrombosis of right femoral vein: Secondary | ICD-10-CM | POA: Diagnosis present

## 2017-12-10 DIAGNOSIS — I10 Essential (primary) hypertension: Secondary | ICD-10-CM | POA: Diagnosis present

## 2017-12-10 DIAGNOSIS — J45909 Unspecified asthma, uncomplicated: Secondary | ICD-10-CM | POA: Diagnosis present

## 2017-12-10 DIAGNOSIS — D649 Anemia, unspecified: Secondary | ICD-10-CM | POA: Diagnosis present

## 2017-12-10 DIAGNOSIS — Z91018 Allergy to other foods: Secondary | ICD-10-CM | POA: Diagnosis not present

## 2017-12-10 LAB — CBC
HCT: 36.3 % (ref 36.0–46.0)
HCT: 37.4 % (ref 36.0–46.0)
HEMOGLOBIN: 12 g/dL (ref 12.0–15.0)
Hemoglobin: 11.5 g/dL — ABNORMAL LOW (ref 12.0–15.0)
MCH: 27.4 pg (ref 26.0–34.0)
MCH: 27.6 pg (ref 26.0–34.0)
MCHC: 31.7 g/dL (ref 30.0–36.0)
MCHC: 32.1 g/dL (ref 30.0–36.0)
MCV: 86.6 fL (ref 80.0–100.0)
MCV: 86 fL (ref 80.0–100.0)
Platelets: 143 10*3/uL — ABNORMAL LOW (ref 150–400)
Platelets: 159 10*3/uL (ref 150–400)
RBC: 4.19 MIL/uL (ref 3.87–5.11)
RBC: 4.35 MIL/uL (ref 3.87–5.11)
RDW: 14.7 % (ref 11.5–15.5)
RDW: 14.5 % (ref 11.5–15.5)
WBC: 6.6 10*3/uL (ref 4.0–10.5)
WBC: 6.5 10*3/uL (ref 4.0–10.5)
nRBC: 0 % (ref 0.0–0.2)
nRBC: 0 % (ref 0.0–0.2)

## 2017-12-10 LAB — TROPONIN I
Troponin I: 0.03 ng/mL (ref <0.03)
Troponin I: 0.04 ng/mL (ref <0.03)
Troponin I: 0.06 ng/mL (ref <0.03)
Troponin I: 0.08 ng/mL (ref <0.03)

## 2017-12-10 LAB — ECHOCARDIOGRAM COMPLETE

## 2017-12-10 LAB — BRAIN NATRIURETIC PEPTIDE: B Natriuretic Peptide: 20.8 pg/mL (ref 0.0–100.0)

## 2017-12-10 LAB — HEPARIN LEVEL (UNFRACTIONATED)
Heparin Unfractionated: 0.37 IU/mL (ref 0.30–0.70)
Heparin Unfractionated: 0.71 IU/mL — ABNORMAL HIGH (ref 0.30–0.70)

## 2017-12-10 MED ORDER — SODIUM CHLORIDE (PF) 0.9 % IJ SOLN
INTRAMUSCULAR | Status: AC
  Administered 2017-12-10: 02:00:00
  Filled 2017-12-10: qty 50

## 2017-12-10 MED ORDER — ONDANSETRON HCL 4 MG PO TABS: 4.0000 mg | ORAL_TABLET | Freq: Four times a day (QID) | ORAL | Status: DC | PRN

## 2017-12-10 MED ORDER — SODIUM CHLORIDE 0.9 % IV SOLN: INTRAVENOUS | Status: DC

## 2017-12-10 MED ORDER — ONDANSETRON HCL 4 MG/2ML IJ SOLN: 4.0000 mg | Freq: Four times a day (QID) | INTRAMUSCULAR | Status: DC | PRN

## 2017-12-10 MED ORDER — HEPARIN (PORCINE) 25000 UT/250ML-% IV SOLN
1650.0000 [IU]/h | INTRAVENOUS | Status: DC
  Administered 2017-12-10: 1550 [IU]/h via INTRAVENOUS
  Administered 2017-12-10: 1650 [IU]/h via INTRAVENOUS
  Filled 2017-12-10 (×2): qty 250

## 2017-12-10 MED ORDER — HEPARIN BOLUS VIA INFUSION
4000.0000 [IU] | Freq: Once | INTRAVENOUS | Status: AC
  Administered 2017-12-10: 4000 [IU] via INTRAVENOUS
  Filled 2017-12-10: qty 4000

## 2017-12-10 MED ORDER — ACETAMINOPHEN 650 MG RE SUPP: 650.0000 mg | Freq: Four times a day (QID) | RECTAL | Status: DC | PRN

## 2017-12-10 MED ORDER — IOPAMIDOL (ISOVUE-370) INJECTION 76%
100.0000 mL | Freq: Once | INTRAVENOUS | Status: AC | PRN
  Administered 2017-12-10: 100 mL via INTRAVENOUS

## 2017-12-10 MED ORDER — IOPAMIDOL (ISOVUE-370) INJECTION 76%
INTRAVENOUS | Status: AC
  Filled 2017-12-10: qty 100

## 2017-12-10 MED ORDER — ACETAMINOPHEN 325 MG PO TABS
650.0000 mg | ORAL_TABLET | Freq: Four times a day (QID) | ORAL | Status: DC | PRN
  Administered 2017-12-11 – 2017-12-12 (×2): 650 mg via ORAL
  Filled 2017-12-10 (×2): qty 2

## 2017-12-10 MED ORDER — HEPARIN (PORCINE) 25000 UT/250ML-% IV SOLN
1550.0000 [IU]/h | INTRAVENOUS | Status: DC
  Administered 2017-12-12 (×2): 1550 [IU]/h via INTRAVENOUS
  Filled 2017-12-10 (×3): qty 250

## 2017-12-10 NOTE — ED Notes (Signed)
Pt is sitting on the side of the stretcher to eat lunch. 

## 2017-12-10 NOTE — Progress Notes (Signed)
ANTICOAGULATION CONSULT NOTE - Initial Consult  Pharmacy Consult for Heparin Indication: pulmonary embolus  Allergies  Allergen Reactions  . Oxaprozin     angioedema  . Sulfonamide Derivatives     REACTION: hives  . Orange Juice [Orange Oil] Hives    Orange juice  . Tomato Hives    tomatoes  . Latex Rash  . Nsaids Rash    Patient Measurements: Height: 5\' 7"  (170.2 cm) Weight: 275 lb (124.7 kg) IBW/kg (Calculated) : 61.6 Heparin Dosing Weight: 91 kg  Vital Signs: Temp: 97.6 F (36.4 C) (12/08 2317) Temp Source: Oral (12/08 2317) BP: 146/110 (12/08 2317) Pulse Rate: 88 (12/08 2317)  Labs: Recent Labs    12/09/17 2328 12/09/17 2334  HGB 11.8*  --   HCT 36.6  --   PLT 164  --   LABPROT 13.2  --   INR 1.01  --   CREATININE 0.69  --   TROPONINI  --  0.08*    Estimated Creatinine Clearance: 120.4 mL/min (by C-G formula based on SCr of 0.69 mg/dL).   Medical History: Past Medical History:  Diagnosis Date  . ALLERGIC RHINITIS 03/17/2009  . Anemia   . ASTHMA 03/17/2009  . DVT (deep venous thrombosis) (HCC)   . ECZEMA 03/17/2009  . Headache(784.0) 06/21/2009   recurrent  . HYPERLIPIDEMIA 03/17/2009  . KELOID 06/21/2009  . Pulmonary emboli (HCC)   . VITAMIN B12 DEFICIENCY 03/18/2009    Medications:  PTA on Pradaxa 110mg  BID - last dose 2 weeks ago (noted has run out of medication)  Assessment:  [age] yr female presents with chest pain and shortness of breath.  PMH significant for PE and DVT.  CTAngio result pending  Pharmacy consulted to dose IV heparin for r/o PE  Goal of Therapy:  Heparin level 0.3-0.7 units/ml Monitor platelets by anticoagulation protocol: Yes   Plan:   Heparin 4000 unit IV bolus x 1 followed by heparin infusion @ 1550 units/hr  Check heparin level 6 hr after heparin started  Follow heparin level and CBC daily while on IV heparin  , Joselyn GlassmanLeann Trefz, PharmD 12/10/2017,2:22 AM

## 2017-12-10 NOTE — ED Notes (Signed)
Patient transported to CT 

## 2017-12-10 NOTE — Progress Notes (Signed)
   Follow Up Note  HPI: Please refer to H&P done earlier this am Briefly this is a 47 year old female with a history of recurrent PE, currently her fourth episode since about 2015, presents again with worsening chest pain, shortness of breath for the past couple of days.  Of note, patient had initially started off with warfarin, and while on warfarin had a PE, patient was switched to Xarelto, and also had a PE on Xarelto.  Patient was switched to Pradaxa and currently has developed another PE.  There is a big question of compliance to all of these medications, because patient reported missing a few doses here and there while taking the 2 previous anticoagulations.  While on Pradaxa, patient lost her insurance and has not been on Pradaxa for about 3 weeks.  She reports that she has gotten back on insurance and would kick in January 02, 2018.  A hypercoagulable work-up has been done, which appeared to be negative.  In the ER, patient remained hemodynamically stable, EKG showed normal sinus rhythm, CT angios showed PE with right heart strain.  Troponin was mildly elevated trended downwards.  She was started on heparin infusion.   Currently, patient still with some pleuritic chest pain, denies any worsening.  Was able to ambulate to the bathroom and back without worsening shortness of breath.  Denies any fever/chills, nausea/vomiting, abdominal pain, diaphoresis, dizziness.  Exam: CV: S1, S2 present Lungs: CTA B Abd: Soft, nontender, nondistended, bowel sounds present Ext: No pedal edema bilaterally  Present on Admission: . Pulmonary embolism (HCC) . Hypertension . Acute pulmonary embolism (HCC)  Recurrent pulmonary embolism: Patient with history of noncompliance, hypercoagulable work-up done negative.  CTA chest showed PE with right heart strain.  Echo showed EF of 55 to 60%, grade 1 diastolic dysfunction.  Troponin mildly elevated, currently trending down.  EKG with normal sinus rhythm.  Continue  heparin infusion.  Case manager consulted  Morbid obesity Advised lifestyle modification

## 2017-12-10 NOTE — ED Notes (Signed)
ED TO INPATIENT HANDOFF REPORT  Name/Age/Gender Hannah Thompson 47 y.o. female  Code Status    Code Status Orders  (From admission, onward)         Start     Ordered   12/10/17 0336  Full code  Continuous     12/10/17 0336        Code Status History    Date Active Date Inactive Code Status Order ID Comments User Context   02/26/2017 1810 02/28/2017 1833 Full Code [id number]  Marinda Elk, MD Inpatient   10/17/2016 1839 10/19/2016 2042 Full Code [id number]  Bobette Mo, MD Inpatient   09/04/2013 1907 09/08/2013 2002 Full Code [id number]  Elease Etienne, MD Inpatient      Home/SNF/Other given to floor  Chief Complaint Other acute pulmonary embolism with acute cor pulmonale (HCC) [I26.09] Acute pulmonary embolism (HCC) [I26.99]  Level of Care/Admitting Diagnosis ED Disposition    ED Disposition Condition Comment   Admit  Hospital Area: [facility] [[id number]]  Level of Care: Stepdown [14]  Admit to SDU based on following criteria: Severe physiological/psychological symptoms:  Any diagnosis requiring assessment & intervention at least every 4 hours on an ongoing basis to obtain desired patient outcomes including stability and rehabilitation  Diagnosis: Acute pulmonary embolism [facility]) [[id number]]  Admitting Physician: Eduard Clos [phone]  Attending Physician: Eduard Clos [phone]  Estimated length of stay: past midnight tomorrow  Certification:: I certify this patient will need inpatient services for at least 2 midnights  PT Class (Do Not Modify): Inpatient [101]  PT Acc Code (Do Not Modify): Private [1]       Medical History Past Medical History:  Diagnosis Date  . ALLERGIC RHINITIS 03/17/2009  . Anemia   . ASTHMA 03/17/2009  . DVT (deep venous thrombosis) (HCC)   . ECZEMA 03/17/2009  . Headache(784.0) 06/21/2009   recurrent  . HYPERLIPIDEMIA 03/17/2009  . KELOID 06/21/2009  . Pulmonary emboli (HCC)   . VITAMIN B12 DEFICIENCY  03/18/2009    Allergies Allergies  Allergen Reactions  . Oxaprozin     angioedema  . Sulfonamide Derivatives     REACTION: hives  . Orange Juice [Orange Oil] Hives    Orange juice  . Tomato Hives    tomatoes  . Latex Rash  . Nsaids Rash    IV Location/Drains/Wounds Patient Lines/Drains/Airways Status   Active Line/Drains/Airways    Name:   Placement date:   Placement time:   Site:   Days:   Peripheral IV 12/10/17 Right Forearm   12/10/17    1111    Forearm   less than 1          Labs/Imaging Results for orders placed or performed during the hospital encounter of 12/10/17 (from the past 48 hour(s))  Basic metabolic panel     Status: Abnormal   Collection Time: 12/09/17 11:28 PM  Result Value Ref Range   Sodium 140 135 - 145 mmol/L   Potassium 3.7 3.5 - 5.1 mmol/L   Chloride 109 98 - 111 mmol/L   CO2 22 22 - 32 mmol/L   Glucose, Bld 106 (H) 70 - 99 mg/dL   BUN 15 6 - 20 mg/dL   Creatinine, Ser 8.41 0.44 - 1.00 mg/dL   Calcium 9.1 8.9 - 32.4 mg/dL   GFR calc non Af Amer >60 >60 mL/min   GFR calc Af Amer >60 >60 mL/min   Anion gap 9 5 - 15    Comment:  Performed at [facility], [address] [place], [place] [address]  CBC     Status: Abnormal   Collection Time: 12/09/17 11:28 PM  Result Value Ref Range   WBC 6.9 4.0 - 10.5 K/uL   RBC 4.24 3.87 - 5.11 MIL/uL   Hemoglobin 11.8 (L) 12.0 - 15.0 g/dL   HCT 60.4 54.0 - 98.1 %   MCV 86.3 80.0 - 100.0 fL   MCH 27.8 26.0 - 34.0 pg   MCHC 32.2 30.0 - 36.0 g/dL   RDW 19.1 47.8 - 29.5 %   Platelets 164 150 - 400 K/uL   nRBC 0.0 0.0 - 0.2 %    Comment: Performed at [facility], [address] [place], [place] [address]  Protime-INR- (order if Patient is taking Coumadin / Warfarin)     Status: None   Collection Time: 12/09/17 11:28 PM  Result Value Ref Range   Prothrombin Time 13.2 11.4 - 15.2 seconds   INR 1.01     Comment: Performed at [facility], [address], [place], [place] [address]  Troponin I - ONCE - STAT     Status: Abnormal   Collection Time: 12/09/17 11:34 PM  Result Value Ref Range   Troponin I 0.08 (HH) <0.03 ng/mL    Comment: CRITICAL RESULT CALLED TO, READ BACK BY AND VERIFIED WITH: OXENDINE,J RN @0006  ON 12/10/17 JACKSON,K Performed at [facility], [address] [place], [place] [address]   I-Stat beta hCG blood, ED     Status: None   Collection Time: 12/09/17 11:35 PM  Result Value Ref Range   I-stat hCG, quantitative <5.0 <5 mIU/mL   Comment 3            Comment:   GEST. AGE      CONC.  (mIU/mL)   <=1 WEEK        5 - 50     2 WEEKS       50 - 500     3 WEEKS       100 - 10,000     4 WEEKS     1,000 - 30,000        FEMALE AND NON-PREGNANT FEMALE:     LESS THAN 5 mIU/mL   CBC     Status: Abnormal   Collection Time: 12/10/17  4:23 AM  Result Value Ref Range   WBC 6.6 4.0 - 10.5 K/uL   RBC 4.19 3.87 - 5.11 MIL/uL   Hemoglobin 11.5 (L) 12.0 - 15.0 g/dL   HCT 95.2 84.1 - 32.4 %   MCV 86.6 80.0 - 100.0 fL   MCH 27.4 26.0 - 34.0 pg   MCHC 31.7 30.0 - 36.0 g/dL   RDW 40.1 02.7 - 25.3 %   Platelets 143 (L) 150 - 400 K/uL   nRBC 0.0 0.0 - 0.2 %    Comment: Performed at [facility], [address], [place], [place] [address]  Troponin I - Now Then Q6H     Status: Abnormal   Collection Time: 12/10/17  4:23 AM  Result Value Ref Range   Troponin I 0.06 (HH) <0.03 ng/mL    Comment: CRITICAL RESULT CALLED TO, READ BACK BY AND VERIFIED WITH: H,OAZIVIPA AT 0543 ON 12/10/17 BY A,MOHAMED Performed at [facility], [address], [place], [place] [address]   Brain natriuretic peptide     Status: None   Collection Time: 12/10/17  4:23 AM  Result Value Ref Range   B Natriuretic Peptide 20.8 0.0 - 100.0 pg/mL    Comment: Performed at [facility], [address] [place], [place] [address]  Heparin level (unfractionated)     Status: None    Collection Time: 12/10/17 11:47 AM  Result Value Ref Range   Heparin Unfractionated 0.37 0.30 - 0.70 IU/mL    Comment: (NOTE) If heparin results are below expected values, and patient dosage has  been confirmed, suggest follow up testing of antithrombin III levels. Performed at [facility], [address], [place], [place] [address]   CBC     Status: None   Collection Time: 12/10/17 11:47 AM  Result Value Ref Range   WBC 6.5 4.0 - 10.5 K/uL   RBC 4.35 3.87 - 5.11 MIL/uL   Hemoglobin 12.0 12.0 - 15.0 g/dL   HCT 09.8 11.9 - 14.7 %   MCV 86.0 80.0 - 100.0 fL   MCH 27.6 26.0 - 34.0 pg   MCHC 32.1 30.0 - 36.0 g/dL   RDW 82.9 56.2 - 13.0 %   Platelets 159 150 - 400 K/uL   nRBC 0.0 0.0 - 0.2 %    Comment: Performed at [facility], [address], [place], [place] [address]  Troponin I - Now Then Q6H     Status: Abnormal   Collection Time: 12/10/17 11:47 AM  Result Value Ref Range   Troponin I 0.04 (HH) <0.03 ng/mL    Comment: CRITICAL VALUE NOTED.  VALUE IS CONSISTENT WITH PREVIOUSLY REPORTED AND CALLED VALUE. Performed at [facility], [address], [place], [place] [address]   Heparin level (unfractionated)     Status: Abnormal   Collection Time: 12/10/17  9:42 PM  Result Value Ref Range   Heparin Unfractionated 0.71 (H) 0.30 - 0.70 IU/mL    Comment: (NOTE) If heparin results are below expected values, and patient dosage has  been confirmed, suggest follow up testing of antithrombin III levels. Performed at [facility], [address] [place], [place] [address]    Dg Chest 2 View  Result Date: 12/09/2017 CLINICAL DATA:  Right chest pain and shortness of breath for the past 3 days. Smoker. EXAM: CHEST - 2 VIEW COMPARISON:  02/26/2017 and chest CTA dated 04/30/2017. FINDINGS: The cardiac silhouette remains borderline enlarged. Tortuous aorta. Clear lungs with normal vascularity. Unremarkable bones.  IMPRESSION: No acute abnormality. Electronically Signed   By: Beckie Salts M.D.   On: 12/09/2017 23:38   Ct Angio Chest Pe W And/or Wo Contrast  Result Date: 12/10/2017 CLINICAL DATA:  Chest pain. Dyspnea. History of pulmonary embolism. EXAM: CT ANGIOGRAPHY CHEST WITH CONTRAST TECHNIQUE: Multidetector CT imaging of the chest was performed using the standard protocol during bolus administration of intravenous contrast. Multiplanar CT image reconstructions and MIPs were obtained to evaluate the vascular anatomy. CONTRAST:  ISOVUE-370 IOPAMIDOL (ISOVUE-370) INJECTION 76% COMPARISON:  Chest radiographs obtained yesterday. Chest CTA dated 04/30/2017. FINDINGS: Cardiovascular: Multiple bilateral pulmonary arterial filling defects of varying sizes, not previously present. These filling defects or larger on the right compared to the left. Dilated right ventricle with a right ventricular to left ventricular ratio of 1.53. Mediastinum/Nodes: No enlarged mediastinal, hilar, or axillary lymph nodes. The previously demonstrated 2.4 x 1.3 cm oval soft tissue density in the right pre vascular space measures 2.4 x 1.0 cm today. Again, this most likely vascular in nature. Thyroid gland, trachea, and esophagus demonstrate  no significant findings. 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 Otherwise, no lung nodules are seen. No airspace consolidation or pleural fluid. Upper Abdomen: Unremarkable. Musculoskeletal: Thoracic spine degenerative changes. Review of the MIP images confirms the above findings. IMPRESSION: 1. Positive for acute PE with CT evidence of right heart strain (RV/LV Ratio = 1.53) consistent with at least submassive (intermediate risk) PE. The presence of right heart strain has been associated with an increased risk of morbidity and mortality. Please activate Code PE by paging [phone]. 2. 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 Critical Value/emergent results were called by telephone at the time of interpretation on 12/10/2017 at 2:13 am to Dr. Zadie RhineNALD WICKLINE , who verbally acknowledged these results. Electronically Signed   By: Beckie SaltsSteven  Reid M.D.   On: 12/10/2017 02:18    Pending Labs Unresulted Labs (From admission, onward)    Start     Ordered   12/11/17 0500  Basic metabolic panel  Tomorrow morning,   R     12/10/17 1428   12/11/17 0500  Heparin level (unfractionated)  Once-Timed,   R     12/10/17 2209   12/10/17 0900  CBC  Daily,   R     12/10/17 0228   12/10/17 0336  Troponin I - Now Then Q6H  Now then every 6 hours,   R     12/10/17 0336          Vitals/Pain Today's Vitals   12/10/17 1830 12/10/17 1832 12/10/17 1900 12/10/17 2235  BP: (!) 134/95 (!) 134/95 108/69 138/89  Pulse: 72 74 67 67  Resp: (!) 24 (!) 26  (!) 26  Temp:      TempSrc:      SpO2: 97% 96% 97% 96%  Weight:    120.1 kg  Height:    5\' 7"  (1.702 m)  PainSc:    0-No pain    Isolation Precautions No active isolations  Medications Medications  acetaminophen (TYLENOL) tablet 650 mg (has no administration in time range)    Or  acetaminophen (TYLENOL) suppository 650 mg (has no administration in time range)  ondansetron (ZOFRAN) tablet 4 mg (has no administration in time range)    Or  ondansetron (ZOFRAN) injection 4 mg (has no administration in time range)  0.9 %  sodium chloride infusion ( Intravenous Hold 12/10/17 0503)  heparin bolus via infusion 4,000 Units (4,000 Units Intravenous Bolus from Bag 12/10/17 0305)    Followed by  heparin ADULT infusion 100 units/mL (25000 units/27750mL sodium chloride 0.45%) (1,550 Units/hr Intravenous Rate/Dose Change 12/10/17 2214)  iopamidol (ISOVUE-370) 76 % injection 100 mL (100 mLs Intravenous Contrast Given 12/10/17 0132)  sodium chloride (PF) 0.9 % injection (  Given by Other 12/10/17  0130)    Mobility walks

## 2017-12-10 NOTE — Progress Notes (Signed)
ANTICOAGULATION CONSULT NOTE - Follow Up Consult  Pharmacy Consult for Heparin Indication: pulmonary embolus  Allergies  Allergen Reactions  . Oxaprozin     angioedema  . Sulfonamide Derivatives     REACTION: hives  . Orange Juice [Orange Oil] Hives    Orange juice  . Tomato Hives    tomatoes  . Latex Rash  . Nsaids Rash    Patient Measurements: Height: 5\' 7"  (170.2 cm) Weight: 275 lb (124.7 kg) IBW/kg (Calculated) : 61.6 Heparin Dosing Weight: 91.3 kg  Vital Signs: BP: 108/69 (12/09 1900) Pulse Rate: 67 (12/09 1900)  Labs: Recent Labs    12/09/17 2328 12/09/17 2334 12/10/17 0423 12/10/17 1147  HGB 11.8*  --  11.5* 12.0  HCT 36.6  --  36.3 37.4  PLT 164  --  143* 159  LABPROT 13.2  --   --   --   INR 1.01  --   --   --   HEPARINUNFRC  --   --   --  0.37  CREATININE 0.69  --   --   --   TROPONINI  --  0.08* 0.06* 0.04*    Estimated Creatinine Clearance: 120.4 mL/min (by C-G formula based on SCr of 0.69 mg/dL).   Medications:  Infusions:  . sodium chloride Stopped (12/10/17 0503)  . heparin 1,650 Units/hr (12/10/17 1816)    Assessment: [age] yr female presents with chest pain and shortness of breath.  PMH significant for recurrent PE and DVT.  She has been on warfarin, Xarelto, and most recently Pradaxa, but has not taken in > 2 weeks d/t running out (insurance/cost issues).  Pharmacy has been consulted for heparin dosing. 12/9 CT angio: positive for PE with R heart strain  Heparin level 0.71 (due at 20:00, delayed d/t blood draw issues) is slightly supra-therapeutic on heparin at 1650 units/hr. CBC: Hgb 12, Plt 159, stable No bleeding or complications reported   Goal of Therapy:  Heparin level 0.3-0.7 units/ml Monitor platelets by anticoagulation protocol: Yes   Plan:   Decrease to heparin IV infusion to 15.5 units/hr  Heparin level in 6 hours  Daily heparin level and CBC  Continue to monitor H&H and platelets   Lynann Beaverhristine  PharmD,  BCPS Pager [phone] 12/10/2017 8:28 PM
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
## 2017-12-10 NOTE — Progress Notes (Signed)
ANTICOAGULATION CONSULT NOTE   Pharmacy Consult for Heparin Indication: pulmonary embolus  Allergies  Allergen Reactions  . Oxaprozin     angioedema  . Sulfonamide Derivatives     REACTION: hives  . Orange Juice [Orange Oil] Hives    Orange juice  . Tomato Hives    tomatoes  . Latex Rash  . Nsaids Rash   Patient Measurements: Height: 5\' 7"  (170.2 cm) Weight: 275 lb (124.7 kg) IBW/kg (Calculated) : 61.6 Heparin Dosing Weight: 91 kg  Vital Signs: BP: 126/94 (12/09 1152) Pulse Rate: 66 (12/09 1152)  Labs: Recent Labs    12/09/17 2328 12/09/17 2334 12/10/17 0423 12/10/17 1147  HGB 11.8*  --  11.5* 12.0  HCT 36.6  --  36.3 37.4  PLT 164  --  143* 159  LABPROT 13.2  --   --   --   INR 1.01  --   --   --   HEPARINUNFRC  --   --   --  0.37  CREATININE 0.69  --   --   --   TROPONINI  --  0.08* 0.06*  --    Estimated Creatinine Clearance: 120.4 mL/min (by C-G formula based on SCr of 0.69 mg/dL).  Medical History: Past Medical History:  Diagnosis Date  . ALLERGIC RHINITIS 03/17/2009  . Anemia   . ASTHMA 03/17/2009  . DVT (deep venous thrombosis) (HCC)   . ECZEMA 03/17/2009  . Headache(784.0) 06/21/2009   recurrent  . HYPERLIPIDEMIA 03/17/2009  . KELOID 06/21/2009  . Pulmonary emboli (HCC)   . VITAMIN B12 DEFICIENCY 03/18/2009   Medications:  PTA on Pradaxa 110mg  BID - last dose 2 weeks ago (noted has run out of medication)  Assessment: [age] yr female presents with chest pain and shortness of breath.  PMH significant for PE and DVT. 12/9 CT angio: positive for PE with R heart strain Heparin bolus 4000 units, infusion at 1550 units/hr begun ~ 0300, first HL requested 0900  Goal of Therapy:  Heparin level 0.3-0.7 units/ml Monitor platelets by anticoagulation protocol: Yes   Today, 12/10/2017 1147 Hep level = 0.37, in low therapeutic range   Plan:   Increase Heparin infusion to 1650 units/hr  Repeat Hep level at 2000, confirmatory level  Daily CBC ordered,  daily Heparin level when at steady state  Otho BellowsGreen,  L PharmD Pager [phone] 12/10/2017, 12:43 PM

## 2017-12-10 NOTE — ED Notes (Signed)
Pt transferred to hospital bed due to holding patient in ED

## 2017-12-10 NOTE — ED Notes (Signed)
Heparin level redrawn and sent to lab.

## 2017-12-10 NOTE — H&P (Signed)
History and Physical    Hannah Thompson WRU:[id number] DOB: 1970/06/16 DOA: 12/10/2017  PCP: Corwin Levins, MD  Patient coming from: Home.  Chief Complaint: Chest pain.  HPI: Hannah Thompson is a 47 y.o. female with history of recurrent pulmonary embolism who has not been taking her Pradaxa for last 2 weeks due to financial issues presents to the ER because of worsening chest pain and shortness of breath for last 4 days.  Denies any fever chills productive cough.  He denies any dizziness or loss of consciousness.  ED Course: In the ER patient is hemodynamically stable EKG shows normal sinus rhythm with LVH.  CT angiogram of the chest shows pulmonary bolus him with right heart strain.  Troponin was mildly elevated but patient was hemodynamically stable started on a heparin infusion.  Review of Systems: As per HPI, rest all negative.   Past Medical History:  Diagnosis Date  . ALLERGIC RHINITIS 03/17/2009  . Anemia   . ASTHMA 03/17/2009  . DVT (deep venous thrombosis) (HCC)   . ECZEMA 03/17/2009  . Headache(784.0) 06/21/2009   recurrent  . HYPERLIPIDEMIA 03/17/2009  . KELOID 06/21/2009  . Pulmonary emboli (HCC)   . VITAMIN B12 DEFICIENCY 03/18/2009    Past Surgical History:  Procedure Laterality Date  . KELOID EXCISION  mid 1990's   s/p keloid removal-laser      reports that she quit smoking about 9 years ago. Her smoking use included cigarettes. She started smoking about 13 years ago. She has a 1.00 pack-year smoking history. She has never used smokeless tobacco. She reports that she drinks alcohol. She reports that she does not use drugs.  Allergies  Allergen Reactions  . Oxaprozin     angioedema  . Sulfonamide Derivatives     REACTION: hives  . Orange Juice [Orange Oil] Hives    Orange juice  . Tomato Hives    tomatoes  . Latex Rash  . Nsaids Rash    Family History  Problem Relation Age of Onset  . Cancer Father        Prostate Cancer  . Hypertension Father   .  Allergies Sister   . Cancer Paternal Aunt        Ovarian Cancer  . Cancer Other        Grandparent-Lung Cancer  . Diabetes Other        Grandparent  . Heart disease Other        Grandparent  . Alcohol abuse Other        Several on both sides of family-3 uncles  . Hypertension Mother   . Diabetes Mother   . Deep vein thrombosis Sister     Prior to Admission medications   Medication Sig Start Date End Date Taking? Authorizing Provider  acetaminophen (TYLENOL) 325 MG tablet Take 2 tablets (650 mg total) by mouth every 6 (six) hours as needed for mild pain, moderate pain or headache (or Fever >/= 101). 09/08/13  Yes Hongalgi, Maximino Greenland, MD  dabigatran 110 MG CAPS Take 110 mg by mouth every 12 (twelve) hours. 04/30/17  Yes Josph Macho, MD  cetirizine (ZYRTEC) 10 MG tablet Take 1 tablet (10 mg total) by mouth daily. Patient not taking: Reported on 12/10/2017 04/28/14   Corwin Levins, MD  EPINEPHrine (EPI-PEN) 0.3 mg/0.3 mL DEVI Inject 0.3 mLs (0.3 mg total) into the muscle daily as needed (Anaphylaxis). Patient not taking: Reported on 03/20/2017 05/27/15   Corwin Levins, MD  polyethylene  glycol (MIRALAX / GLYCOLAX) packet Take 17 g by mouth daily as needed for mild constipation. Patient not taking: Reported on 12/10/2017 02/28/17   Rolly SalterPatel, Pranav M, MD  triamcinolone (NASACORT AQ) 55 MCG/ACT AERO nasal inhaler Place 2 sprays into the nose daily. Patient not taking: Reported on 12/10/2017 04/28/14   Corwin LevinsJohn, James W, MD    Physical Exam: Vitals:   12/09/17 2317 12/09/17 2318 12/10/17 0220  BP: (!) 146/110  120/82  Pulse: 88  71  Resp: 16  17  Temp: 97.6 F (36.4 C)    TempSrc: Oral    SpO2: 100%  100%  Weight:  124.7 kg   Height:  5\' 7"  (1.702 m)       Constitutional: Moderately built and nourished. Vitals:   12/09/17 2317 12/09/17 2318 12/10/17 0220  BP: (!) 146/110  120/82  Pulse: 88  71  Resp: 16  17  Temp: 97.6 F (36.4 C)    TempSrc: Oral    SpO2: 100%  100%  Weight:  124.7  kg   Height:  5\' 7"  (1.702 m)    Eyes: Anicteric no pallor. ENMT: No discharge from the ears eyes nose or mouth. Neck: No mass felt.  No neck rigidity.  No JVD appreciated. Respiratory: No rhonchi or crepitations. Cardiovascular: S1-S2 heard. Abdomen: Soft nontender bowel sounds present. Musculoskeletal: No edema.  No joint effusion. Skin: No rash. Neurologic: Alert awake oriented to time place and person.  Moves all extremities. Psychiatric: Appears normal.  Normal affect.   Labs on Admission: I have personally reviewed following labs and imaging studies  CBC: Recent Labs  Lab 12/09/17 2328  WBC 6.9  HGB 11.8*  HCT 36.6  MCV 86.3  PLT 164   Basic Metabolic Panel: Recent Labs  Lab 12/09/17 2328  NA 140  K 3.7  CL 109  CO2 22  GLUCOSE 106*  BUN 15  CREATININE 0.69  CALCIUM 9.1   GFR: Estimated Creatinine Clearance: 120.4 mL/min (by C-G formula based on SCr of 0.69 mg/dL). Liver Function Tests: No results for input(s): AST, ALT, ALKPHOS, BILITOT, PROT, ALBUMIN in the last 168 hours. No results for input(s): LIPASE, AMYLASE in the last 168 hours. No results for input(s): AMMONIA in the last 168 hours. Coagulation Profile: Recent Labs  Lab 12/09/17 2328  INR 1.01   Cardiac Enzymes: Recent Labs  Lab 12/09/17 2334  TROPONINI 0.08*   BNP (last 3 results) No results for input(s): PROBNP in the last 8760 hours. HbA1C: No results for input(s): HGBA1C in the last 72 hours. CBG: No results for input(s): GLUCAP in the last 168 hours. Lipid Profile: No results for input(s): CHOL, HDL, LDLCALC, TRIG, CHOLHDL, LDLDIRECT in the last 72 hours. Thyroid Function Tests: No results for input(s): TSH, T4TOTAL, FREET4, T3FREE, THYROIDAB in the last 72 hours. Anemia Panel: No results for input(s): VITAMINB12, FOLATE, FERRITIN, TIBC, IRON, RETICCTPCT in the last 72 hours. Urine analysis:    Component Value Date/Time   COLORURINE YELLOW 09/04/2013 1028   APPEARANCEUR  CLEAR 09/04/2013 1028   LABSPEC 1.007 09/04/2013 1028   PHURINE 7.0 09/04/2013 1028   GLUCOSEU NEGATIVE 09/04/2013 1028   GLUCOSEU NEGATIVE 06/16/2010 1659   HGBUR NEGATIVE 09/04/2013 1028   BILIRUBINUR NEGATIVE 09/04/2013 1028   KETONESUR NEGATIVE 09/04/2013 1028   PROTEINUR NEGATIVE 09/04/2013 1028   UROBILINOGEN 0.2 09/04/2013 1028   NITRITE NEGATIVE 09/04/2013 1028   LEUKOCYTESUR NEGATIVE 09/04/2013 1028   Sepsis Labs: @LABRCNTIP (procalcitonin:4,lacticidven:4) )No results found for this or any previous  visit (from the past 240 hour(s)).   Radiological Exams on Admission: Dg Chest 2 View  Result Date: 12/09/2017 CLINICAL DATA:  Right chest pain and shortness of breath for the past 3 days. Smoker. EXAM: CHEST - 2 VIEW COMPARISON:  02/26/2017 and chest CTA dated 04/30/2017. FINDINGS: The cardiac silhouette remains borderline enlarged. Tortuous aorta. Clear lungs with normal vascularity. Unremarkable bones. IMPRESSION: No acute abnormality. Electronically Signed   By: Beckie Salts M.D.   On: 12/09/2017 23:38   Ct Angio Chest Pe W And/or Wo Contrast  Result Date: 12/10/2017 CLINICAL DATA:  Chest pain. Dyspnea. History of pulmonary embolism. EXAM: CT ANGIOGRAPHY CHEST WITH CONTRAST TECHNIQUE: Multidetector CT imaging of the chest was performed using the standard protocol during bolus administration of intravenous contrast. Multiplanar CT image reconstructions and MIPs were obtained to evaluate the vascular anatomy. CONTRAST:  ISOVUE-370 IOPAMIDOL (ISOVUE-370) INJECTION 76% COMPARISON:  Chest radiographs obtained yesterday. Chest CTA dated 04/30/2017. FINDINGS: Cardiovascular: Multiple bilateral pulmonary arterial filling defects of varying sizes, not previously present. These filling defects or larger on the right compared to the left. Dilated right ventricle with a right ventricular to left ventricular ratio of 1.53. Mediastinum/Nodes: No enlarged mediastinal, hilar, or axillary lymph  nodes. The previously demonstrated 2.4 x 1.3 cm oval soft tissue density in the right pre vascular space measures 2.4 x 1.0 cm today. Again, this most likely vascular in nature. Thyroid gland, trachea, and esophagus demonstrate no significant findings. 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 Otherwise, no lung nodules are seen. No airspace consolidation or pleural fluid. Upper Abdomen: Unremarkable. Musculoskeletal: Thoracic spine degenerative changes. Review of the MIP images confirms the above findings. IMPRESSION: 1. Positive for acute PE with CT evidence of right heart strain (RV/LV Ratio = 1.53) consistent with at least submassive (intermediate risk) PE. The presence of right heart strain has been associated with an increased risk of morbidity and mortality. Please activate Code PE by paging [phone]. 2. 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 Critical Value/emergent results were called by telephone at the time of interpretation on 12/10/2017 at 2:13 am to Dr. Zadie Rhine , who verbally acknowledged these results. Electronically Signed   By: Beckie Salts M.D.   On: 12/10/2017 02:18    EKG: Independently reviewed.  Normal sinus rhythm with LVH.  Assessment/Plan Principal Problem:   Pulmonary embolism (HCC) Active Problems:   Acute pulmonary embolism (HCC)   Hypertension    1. Pulmonary embolism with history of recurrent pulmonary bowel syndrome at this time probably prescribed by patient missing her dose of Pradaxa for last 2 weeks.  CAT scan does show strain pattern for which I have discussed with on-call pulmonary critical care who advised heparin since patient is hemodynamically stable.  Check 2D echo cardiac markers BNP. 2. Normocytic normochromic anemia -follow CBC. 3. Grade 2 diastolic dysfunction per 2D  echo done in October 2018.  Appears compensated.  Closely monitor.   DVT prophylaxis: Heparin infusion. Code Status: Full code. Family Communication: Discussed with patient. Disposition Plan: Home. Consults called: Discussed with pulmonologist. Admission status: Inpatient.   Eduard Clos MD [facility] Pager [phone].  If 7PM-7AM, please contact night-coverage [email or web address] Password [email or web address]  12/10/2017, 3:37 AM

## 2017-12-11 ENCOUNTER — Inpatient Hospital Stay (HOSPITAL_COMMUNITY): Payer: BLUE CROSS/BLUE SHIELD

## 2017-12-11 DIAGNOSIS — I2699 Other pulmonary embolism without acute cor pulmonale: Secondary | ICD-10-CM

## 2017-12-11 LAB — CBC
HCT: 35.7 % — ABNORMAL LOW (ref 36.0–46.0)
Hemoglobin: 11.4 g/dL — ABNORMAL LOW (ref 12.0–15.0)
MCH: 27 pg (ref 26.0–34.0)
MCHC: 31.9 g/dL (ref 30.0–36.0)
MCV: 84.6 fL (ref 80.0–100.0)
PLATELETS: 171 10*3/uL (ref 150–400)
RBC: 4.22 MIL/uL (ref 3.87–5.11)
RDW: 14.6 % (ref 11.5–15.5)
WBC: 5.8 10*3/uL (ref 4.0–10.5)
nRBC: 0 % (ref 0.0–0.2)

## 2017-12-11 LAB — MRSA PCR SCREENING: MRSA by PCR: NEGATIVE

## 2017-12-11 LAB — BASIC METABOLIC PANEL
Anion gap: 9 (ref 5–15)
BUN: 12 mg/dL (ref 6–20)
CO2: 23 mmol/L (ref 22–32)
Calcium: 8.7 mg/dL — ABNORMAL LOW (ref 8.9–10.3)
Chloride: 107 mmol/L (ref 98–111)
Creatinine, Ser: 0.59 mg/dL (ref 0.44–1.00)
GFR calc Af Amer: >60 mL/min (ref >60)
GFR calc non Af Amer: >60 mL/min (ref >60)
GLUCOSE: 115 mg/dL — AB (ref 70–99)
Potassium: 3.6 mmol/L (ref 3.5–5.1)
Sodium: 139 mmol/L (ref 135–145)

## 2017-12-11 LAB — HEPARIN LEVEL (UNFRACTIONATED)
Heparin Unfractionated: 0.45 IU/mL (ref 0.30–0.70)
Heparin Unfractionated: 0.49 IU/mL (ref 0.30–0.70)

## 2017-12-11 NOTE — Care Management Note (Signed)
Case Management Note  Patient Details  Name: Hannah Thompson MRN: [id number] Date of Birth: 1970/08/07  Subjective/Objective:                  47 year old female with a history of recurrent PE, currently her fourth episode since about 2015, presents again with worsening chest pain, shortness of breath for the past couple of days.  Of note, patient had initially started off with warfarin, and while on warfarin had a PE, patient was switched to Xarelto, and also had a PE on Xarelto.  Patient was switched to Pradaxa and currently has developed another PE.  There is a big question of compliance to all of these medications, because patient reported missing a few doses here and there while taking the 2 previous anticoagulations.  While on Pradaxa, patient lost her insurance and has not been on Pradaxa for about 3 weeks.  She reports that she has gotten back on insurance and would kick in January 02, 2018.  A hypercoagulable work-up has been done, which appeared to be negative.  In the ER, patient remained hemodynamically stable, EKG showed normal sinus rhythm, CT angios showed PE with right heart strain.  Troponin was mildly elevated trended downwards.  She was started on heparin infusion.  Action/Plan: Will follow for progression of care and clinical status. Will follow for case management needs none present at this time.  Expected Discharge Date:  (unknown)               Expected Discharge Plan:  Home/Self Care  In-House Referral:     Discharge planning Services  CM Consult  Post Acute Care Choice:    Choice offered to:     DME Arranged:    DME Agency:     HH Arranged:    HH Agency:     Status of Service:  In process, will continue to follow  If discussed at Long Length of Stay Meetings, dates discussed:    Additional Comments:  Golda AcreDavis,  Lynn, RN 12/11/2017, 9:29 AM

## 2017-12-11 NOTE — Progress Notes (Signed)
ANTICOAGULATION CONSULT NOTE - Follow Up Consult  Pharmacy Consult for Heparin Indication: pulmonary embolus  Allergies  Allergen Reactions  . Oxaprozin     angioedema  . Sulfonamide Derivatives     REACTION: hives  . Orange Juice [Orange Oil] Hives    Orange juice  . Tomato Hives    tomatoes  . Latex Rash  . Nsaids Rash    Patient Measurements: Height: 5\' 7"  (170.2 cm) Weight: 264 lb 12.4 oz (120.1 kg) IBW/kg (Calculated) : 61.6 Heparin Dosing Weight: 91.3 kg  Vital Signs: Temp: 98.8 F (37.1 C) (12/10 0400) Temp Source: Oral (12/10 0400) BP: 128/81 (12/10 0400) Pulse Rate: 65 (12/10 0300)  Labs: Recent Labs    12/09/17 2328  12/10/17 0423 12/10/17 1147 12/10/17 2142 12/10/17 2244 12/11/17 0309  HGB 11.8*  --  11.5* 12.0  --   --  11.4*  HCT 36.6  --  36.3 37.4  --   --  35.7*  PLT 164  --  143* 159  --   --  171  LABPROT 13.2  --   --   --   --   --   --   INR 1.01  --   --   --   --   --   --   HEPARINUNFRC  --   --   --  0.37 0.71*  --  0.45  CREATININE 0.69  --   --   --   --   --  0.59  TROPONINI  --    < > 0.06* 0.04*  --  0.03*  --    < > = values in this interval not displayed.    Estimated Creatinine Clearance: 117.9 mL/min (by C-G formula based on SCr of 0.59 mg/dL).   Medications:  Infusions:  . sodium chloride Stopped (12/10/17 0503)  . heparin 1,550 Units/hr (12/10/17 2214)    Assessment: [age] yr female presents with chest pain and shortness of breath.  PMH significant for recurrent PE and DVT.  She has been on warfarin, Xarelto, and most recently Pradaxa, but has not taken in > 2 weeks d/t running out (insurance/cost issues).  Pharmacy has been consulted for heparin dosing. 12/9 CT angio: positive for PE with R heart strain  12/11/17  0309 Heparin level = 0.45 with heparin infusing @ 1550 units/hr   CBC: Hgb 11.4, Plt 171, stable No bleeding or complications reported   Goal of Therapy:  Heparin level 0.3-0.7 units/ml Monitor  platelets by anticoagulation protocol: Yes   Plan:   Continue heparin IV infusion @ 1550 units/hr  Repeat Heparin level in 6 hours to confirm therapeutic dose  Daily heparin level and CBC  Continue to monitor H&H and platelets   Terrilee FilesLeann , PharmD 12/11/2017 4:58 AM

## 2017-12-11 NOTE — Progress Notes (Addendum)
ANTICOAGULATION CONSULT NOTE - Follow Up Consult  Pharmacy Consult for Heparin Indication: pulmonary embolus  Allergies  Allergen Reactions  . Oxaprozin     angioedema  . Sulfonamide Derivatives     REACTION: hives  . Orange Juice [Orange Oil] Hives    Orange juice  . Tomato Hives    tomatoes  . Latex Rash  . Nsaids Rash    Patient Measurements: Height: 5\' 7"  (170.2 cm) Weight: 264 lb 12.4 oz (120.1 kg) IBW/kg (Calculated) : 61.6 Heparin Dosing Weight: 91.3 kg  Vital Signs: Temp: 98.2 F (36.8 C) (12/10 0800) Temp Source: Oral (12/10 0800) BP: 129/71 (12/10 0600) Pulse Rate: 65 (12/10 0300)  Labs: Recent Labs    12/09/17 2328  12/10/17 0423  12/10/17 1147 12/10/17 2142 12/10/17 2244 12/11/17 0309 12/11/17 0847  HGB 11.8*  --  11.5*  --  12.0  --   --  11.4*  --   HCT 36.6  --  36.3  --  37.4  --   --  35.7*  --   PLT 164  --  143*  --  159  --   --  171  --   LABPROT 13.2  --   --   --   --   --   --   --   --   INR 1.01  --   --   --   --   --   --   --   --   HEPARINUNFRC  --   --   --    < > 0.37 0.71*  --  0.45 0.49  CREATININE 0.69  --   --   --   --   --   --  0.59  --   TROPONINI  --    < > 0.06*  --  0.04*  --  0.03*  --   --    < > = values in this interval not displayed.    Estimated Creatinine Clearance: 117.9 mL/min (by C-G formula based on SCr of 0.59 mg/dL).   Medications:  Infusions:  . sodium chloride Stopped (12/10/17 0503)  . heparin 1,550 Units/hr (12/10/17 2214)    Assessment: [age] yr female presents with chest pain and shortness of breath.  PMH significant for recurrent PE and DVT.  She has been on warfarin, Xarelto, and most recently Pradaxa, but has not taken in > 2 weeks d/t running out (insurance/cost issues).  Pharmacy has been consulted for heparin dosing. 12/9 CT angio: positive for PE with R heart strain  12/11/17  0309 Heparin level = 0.45 with heparin infusing @ 1550 units/hr   CBC: Hgb 11.4, Plt 171, stable No  bleeding or complications reported   12/11/17 9:57 AM   HL 0.49 remains at goal  No bleeding or line issues per RN  Goal of Therapy:  Heparin level 0.3-0.7 units/ml Monitor platelets by anticoagulation protocol: Yes   Plan:   Continue heparin IV infusion @ 1550 units/hr  Daily heparin level and CBC  Continue to monitor H&H and platelets  Monitor for s/s of bleeding    Luisa HartScott , PharmD Clinical Pharmacist Pager # [phone]  12/11/2017 9:56 AM

## 2017-12-11 NOTE — Progress Notes (Signed)
Bilateral lower extremities venous duplex exam completed.  Positive  Findings consistent with acute deep vein thrombosis involving the right popliteal vein, right posterior tibial vein, right peroneal vein, and right femoral vein; acute deep vein thrombosis involving the left posterior tibial vein, and left peroneal vein.  Please see preliminary notes on CV PROC under chart review.  H (RDMS RVT) 12/11/17 3:21 PM

## 2017-12-11 NOTE — Progress Notes (Signed)
PROGRESS NOTE    Hannah Thompson  BJY:[id number] DOB: 05-09-1970 DOA: 12/10/2017 PCP: Hannah Thompson, Hannah W, MD   Brief Narrative:  Hannah Thompson is Hannah Thompson 47 y.o. female with history of recurrent pulmonary embolism who has not been taking her Pradaxa for last 2 weeks due to financial issues presents to the ER because of worsening chest pain and shortness of breath for last 4 days.  Denies any fever chills productive cough.  He denies any dizziness or loss of consciousness.  Assessment & Plan:   Principal Problem:   Pulmonary embolism (HCC) Active Problems:   Acute pulmonary embolism (HCC)   Hypertension   1. Submassive Pulmonary embolism with history of recurrent VTE: Pt admits to missing pradaxa due to inability to afford after losing insurance.  CT with evidence of RH strain.   1. Continue heparin for now 2. Need to discharge her on something she'll be able to afford, care management c/s (she's failed warfarin, xarelto, and now pradaxa, but it sounds like pradaxa was due to nonadherence.  ? If failure of warfarin and xarelto similarly from nonadherence (per previous hospitalization note, she was taking 10 mg xarelto instead of 20). 3. LE US with bilateral DVTs 4. Echo with diastolic dysfunction.  RV with normal systolic function. 5. She follows with Dr. Pearlie Thompson for her hx of recurrent PE 6. Elevated troponin, mild, likely 2/2 to above.  Normal BNP.  2. Normocytic normochromic anemia -follow CBC.  3. Grade 2 diastolic dysfunction per 2D echo done in October 2018.  Appears compensated.  Closely monitor.  4. 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 No cystic structure found in the popliteal fossa. Left: Findings consistent with acute deep vein thrombosis involving the left posterior tibial vein, and left peroneal vein. No cystic structure found in the popliteal fossa.  Echo Study Conclusions  - Left ventricle: The cavity size was normal. Systolic function was   normal. The estimated ejection fraction was in the range of 55%   to 60%. Wall motion was normal; there were no regional wall   motion abnormalities. Doppler parameters are consistent with   abnormal left ventricular relaxation (grade 1 diastolic   dysfunction). - Aortic valve: Transvalvular velocity was within the normal range.   There was no stenosis. There was no regurgitation. Valve area   (VTI): 3.9 cm^2. Valve area (Vmax): 3.47 cm^2. Valve area   (Vmean): 3.65 cm^2. - Aorta: Ascending aortic diameter: 38 mm (S). - Ascending aorta: The ascending aorta was mildly dilated. - Mitral valve: Transvalvular velocity was within the normal range.   There was no evidence for stenosis. There was trivial   regurgitation. - Right ventricle: The cavity size was normal. Wall thickness was   normal. Systolic function was normal. - Tricuspid valve: There was trivial regurgitation. - Pulmonary arteries: Systolic pressure was within the normal   range. PA peak pressure: 26 mm Hg (S).  Antimicrobials:  Anti-infectives (From admission, onward)   None     Subjective: Didn't take pradaxa for 2 weeks No complaints.  Objective: Vitals:   12/11/17 0300 12/11/17 0400 12/11/17 0600 12/11/17 0800  BP: 113/60 128/81 129/71  Pulse: 65     Resp: 16 (!) 22    Temp:  98.8 F (37.1 C)  98.2 F (36.8 C)  TempSrc:  Oral  Oral  SpO2: 96% 96%    Weight:      Height:        Intake/Output Summary (Last 24 hours) at 12/11/2017 0938 Last data filed at 12/11/2017 0600 Gross per 24 hour  Intake 240.38 ml  Output -  Net 240.38 ml   Filed Weights     12/09/17 2318 12/10/17 2235  Weight: 124.7 kg 120.1 kg    Examination:  General exam: Appears calm and comfortable.  obese Respiratory system: Clear to auscultation. Respiratory effort normal. Cardiovascular system: S1 & S2 heard, RRR Gastrointestinal system: Abdomen is nondistended, soft and nontender Central nervous system: Alert and oriented. No focal neurological deficits. Extremities: no lee Skin: No rashes, lesions or ulcers Psychiatry: Judgement and insight appear normal. Mood & affect appropriate.     Data Reviewed: I have personally reviewed following labs and imaging studies  CBC: Recent Labs  Lab 12/09/17 2328 12/10/17 0423 12/10/17 1147 12/11/17 0309  WBC 6.9 6.6 6.5 5.8  HGB 11.8* 11.5* 12.0 11.4*  HCT 36.6 36.3 37.4 35.7*  MCV 86.3 86.6 86.0 84.6  PLT 164 143* 159 171   Basic Metabolic Panel: Recent Labs  Lab 12/09/17 2328 12/11/17 0309  NA 140 139  K 3.7 3.6  CL 109 107  CO2 22 23  GLUCOSE 106* 115*  BUN 15 12  CREATININE 0.69 0.59  CALCIUM 9.1 8.7*   GFR: Estimated Creatinine Clearance: 117.9 mL/min (by C-G formula based on SCr of 0.59 mg/dL). Liver Function Tests: No results for input(s): AST, ALT, ALKPHOS, BILITOT, PROT, ALBUMIN in the last 168 hours. No results for input(s): LIPASE, AMYLASE in the last 168 hours. No results for input(s): AMMONIA in the last 168 hours. Coagulation Profile: Recent Labs  Lab 12/09/17 2328  INR 1.01   Cardiac Enzymes: Recent Labs  Lab 12/09/17 2334 12/10/17 0423 12/10/17 1147 12/10/17 2244  TROPONINI 0.08* 0.06* 0.04* 0.03*   BNP (last 3 results) No results for input(s): PROBNP in the last 8760 hours. HbA1C: No results for input(s): HGBA1C in the last 72 hours. CBG: No results for input(s): GLUCAP in the last 168 hours. Lipid Profile: No results for input(s): CHOL, HDL, LDLCALC, TRIG, CHOLHDL, LDLDIRECT in the last 72 hours. Thyroid Function Tests: No results for input(s): TSH, T4TOTAL,  FREET4, T3FREE, THYROIDAB in the last 72 hours. Anemia Panel: No results for input(s): VITAMINB12, FOLATE, FERRITIN, TIBC, IRON, RETICCTPCT in the last 72 hours. Sepsis Labs: No results for input(s): PROCALCITON, LATICACIDVEN in the last 168 hours.  Recent Results (from the past 240 hour(s))  MRSA PCR Screening     Status: None   Collection Time: 12/11/17  3:36 AM  Result Value Ref Range Status   MRSA by PCR NEGATIVE NEGATIVE Final    Comment:        The GeneXpert MRSA Assay (FDA approved for NASAL specimens only), is one component of [facility] comprehensive MRSA colonization surveillance program. It is not intended to diagnose MRSA infection nor to guide or monitor treatment for MRSA infections. Performed at [facility], [address], [place], [place] [address]          Radiology Studies: Dg Chest 2 View  Result Date: 12/09/2017 CLINICAL DATA:  Right chest pain and shortness of breath for the past 3 days. Smoker. EXAM: CHEST - 2 VIEW  COMPARISON:  02/26/2017 and chest CTA dated 04/30/2017. FINDINGS: The cardiac silhouette remains borderline enlarged. Tortuous aorta. Clear lungs with normal vascularity. Unremarkable bones. IMPRESSION: No acute abnormality. Electronically Signed   By: Beckie Salts M.D.   On: 12/09/2017 23:38   Ct Angio Chest Pe Thompson And/or Wo Contrast  Result Date: 12/10/2017 CLINICAL DATA:  Chest pain. Dyspnea. History of pulmonary embolism. EXAM: CT ANGIOGRAPHY CHEST WITH CONTRAST TECHNIQUE: Multidetector CT imaging of the chest was performed using the standard protocol during bolus administration of intravenous contrast. Multiplanar CT image reconstructions and MIPs were obtained to evaluate the vascular anatomy. CONTRAST:  ISOVUE-370 IOPAMIDOL (ISOVUE-370) INJECTION 76% COMPARISON:  Chest radiographs obtained yesterday. Chest CTA dated 04/30/2017. FINDINGS: Cardiovascular: Multiple bilateral pulmonary arterial filling defects of varying sizes,  not previously present. These filling defects or larger on the right compared to the left. Dilated right ventricle with Emmalou Hunger right ventricular to left ventricular ratio of 1.53. Mediastinum/Nodes: No enlarged mediastinal, hilar, or axillary lymph nodes. The previously demonstrated 2.4 x 1.3 cm oval soft tissue density in the right pre vascular space measures 2.4 x 1.0 cm today. Again, this most likely vascular in nature. Thyroid gland, trachea, and esophagus demonstrate no significant findings. 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 Otherwise, no lung nodules are seen. No airspace consolidation or pleural fluid. Upper Abdomen: Unremarkable. Musculoskeletal: Thoracic spine degenerative changes. Review of the MIP images confirms the above findings. IMPRESSION: 1. Positive for acute PE with CT evidence of right heart strain (RV/LV Ratio = 1.53) consistent with at least submassive (intermediate risk) PE. The presence of right heart strain has been associated with an increased risk of morbidity and mortality. Please activate Code PE by paging [phone]. 2. 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 Critical Value/emergent results were called by telephone at the time of interpretation on 12/10/2017 at 2:13 am to Dr. Zadie Rhine , who verbally acknowledged these results. Electronically Signed   By: Beckie Salts M.D.   On: 12/10/2017 02:18        Scheduled Meds: Continuous Infusions: . sodium chloride Stopped (12/10/17 0503)  . heparin 1,550 Units/hr (12/10/17 2214)     LOS: 1 day    Time spent: over 30 min    Lacretia Nicks, MD [facility] Pager [phone]  If 7PM-7AM, please contact night-coverage [email or web address] Password [email or web address] 12/11/2017, 9:38 AM

## 2017-12-12 DIAGNOSIS — I82403 Acute embolism and thrombosis of unspecified deep veins of lower extremity, bilateral: Secondary | ICD-10-CM

## 2017-12-12 LAB — CBC
HCT: 37.1 % (ref 36.0–46.0)
HEMOGLOBIN: 11.8 g/dL — AB (ref 12.0–15.0)
MCH: 27.6 pg (ref 26.0–34.0)
MCHC: 31.8 g/dL (ref 30.0–36.0)
MCV: 86.9 fL (ref 80.0–100.0)
Platelets: 153 10*3/uL (ref 150–400)
RBC: 4.27 MIL/uL (ref 3.87–5.11)
RDW: 14.6 % (ref 11.5–15.5)
WBC: 6 10*3/uL (ref 4.0–10.5)
nRBC: 0 % (ref 0.0–0.2)

## 2017-12-12 LAB — COMPREHENSIVE METABOLIC PANEL
ALT: 13 U/L (ref 0–44)
AST: 15 U/L (ref 15–41)
Albumin: 3.6 g/dL (ref 3.5–5.0)
Alkaline Phosphatase: 57 U/L (ref 38–126)
Anion gap: 7 (ref 5–15)
BUN: 11 mg/dL (ref 6–20)
CO2: 24 mmol/L (ref 22–32)
Calcium: 9 mg/dL (ref 8.9–10.3)
Chloride: 107 mmol/L (ref 98–111)
Creatinine, Ser: 0.55 mg/dL (ref 0.44–1.00)
GFR calc Af Amer: >60 mL/min (ref >60)
Glucose, Bld: 117 mg/dL — ABNORMAL HIGH (ref 70–99)
Potassium: 3.8 mmol/L (ref 3.5–5.1)
Sodium: 138 mmol/L (ref 135–145)
TOTAL PROTEIN: 6.9 g/dL (ref 6.5–8.1)
Total Bilirubin: 0.3 mg/dL (ref 0.3–1.2)

## 2017-12-12 LAB — MAGNESIUM: Magnesium: 2 mg/dL (ref 1.7–2.4)

## 2017-12-12 LAB — HEPARIN LEVEL (UNFRACTIONATED): Heparin Unfractionated: 0.59 IU/mL (ref 0.30–0.70)

## 2017-12-12 NOTE — Progress Notes (Signed)
PROGRESS NOTE    Hannah Thompson  [name] DOB: September 08, 1970 DOA: 12/10/2017 PCP: Corwin Levins, MD    Brief Narrative:   Hannah Thompson is a 47 y.o. female with history of recurrent pulmonary embolism who has not been taking her Pradaxa for last 2 weeks due to financial issues presents to the ER because of worsening chest pain and shortness of breath for last 4 days.    Patient found to have acute PE.  Also found to have bilateral lower extremity DVT.  Assessment & Plan:  Submassive Pulmonary embolism with history of recurrent VTE  Patient had not been taking her Pradaxa due to financial reasons.  She did not think of reaching out to her primary care provider or her hematologist.  This is the most likely reason for her recurrent PE and new DVTs.  CT scan suggested right heart strain.  No such concern identified on echocardiogram.  Patient was started on IV heparin.  She tells me that she is going to get back on her insurance starting January.  Will discuss with Dr. Myna Hidalgo regarding further anticoagulation strategy.  May be reasonable to put her back on Pradaxa as this will not be considered failure of the medication and more so noncompliance.  She previously has had failure of treatment with warfarin and Xarelto but this remains unclear.   Normocytic normochromic anemia Hemoglobin is stable.  No evidence of overt bleeding.  Grade 1 diastolic dysfunction Noted on echocardiogram done during this hospitalization.  Currently well compensated.    Vascular Malformations Incidentally noted on CT scan.  Stable as per report.     DVT prophylaxis: heparin Code Status: full  Family Communication: none at bedside Disposition Plan: Plan to transition her to oral anticoagulation tomorrow once above has been discussed with her hematologist.   Consultants:   none  Procedures:  LE [place] Summary: Right: Findings consistent with acute deep vein thrombosis involving the right popliteal vein,  right posterior tibial vein, right peroneal vein, and right femoral vein. No cystic structure found in the popliteal fossa. Left: Findings consistent with acute deep vein thrombosis involving the left posterior tibial vein, and left peroneal vein. No cystic structure found in the popliteal fossa.  Echo Study Conclusions  - Left ventricle: The cavity size was normal. Systolic function was   normal. The estimated ejection fraction was in the range of 55%   to 60%. Wall motion was normal; there were no regional wall   motion abnormalities. Doppler parameters are consistent with   abnormal left ventricular relaxation (grade 1 diastolic   dysfunction). - Aortic valve: Transvalvular velocity was within the normal range.   There was no stenosis. There was no regurgitation. Valve area   (VTI): 3.9 cm^2. Valve area (Vmax): 3.47 cm^2. Valve area   (Vmean): 3.65 cm^2. - Aorta: Ascending aortic diameter: 38 mm (S). - Ascending aorta: The ascending aorta was mildly dilated. - Mitral valve: Transvalvular velocity was within the normal range.   There was no evidence for stenosis. There was trivial   regurgitation. - Right ventricle: The cavity size was normal. Wall thickness was   normal. Systolic function was normal. - Tricuspid valve: There was trivial regurgitation. - Pulmonary arteries: Systolic pressure was within the normal   range. PA peak pressure: 26 mm Hg (S).  Antimicrobials:  Anti-infectives (From admission, onward)   None     Subjective: Denies any chest pain or shortness of breath this morning.  Objective: Vitals:   12/11/17  1525 12/11/17 2040 12/12/17 0536 12/12/17 1241  BP: 116/87 119/82 118/78 116/68  Pulse: 71 (!) 57 60 66  Resp: (!) 24 17 16    Temp: 98.2 F (36.8 C) 98.7 F (37.1 C) 97.9 F (36.6 C) 98.6 F (37 C)  TempSrc: Oral Oral Oral Oral  SpO2: 97% 92% 94% 91%  Weight:   121.1 kg   Height:        Intake/Output Summary (Last 24 hours) at 12/12/2017  1318 Last data filed at 12/11/2017 1810 Gross per 24 hour  Intake 364.58 ml  Output -  Net 364.58 ml   Filed Weights   12/09/17 2318 12/10/17 2235 12/12/17 0536  Weight: 124.7 kg 120.1 kg 121.1 kg    Examination:  General exam: Awake alert.  In no distress Respiratory system: Clear to auscultation bilaterally.  Normal effort at rest Cardiovascular system: S1-S2 is normal regular.  No S3-S4 Gastrointestinal system: Abdomen is soft.  Nontender nondistended Central nervous system: Alert and oriented x3.  No focal neurological deficits.    Data Reviewed: I have personally reviewed following labs and imaging studies  CBC: Recent Labs  Lab 12/09/17 2328 12/10/17 0423 12/10/17 1147 12/11/17 0309 12/12/17 0446  WBC 6.9 6.6 6.5 5.8 6.0  HGB 11.8* 11.5* 12.0 11.4* 11.8*  HCT 36.6 36.3 37.4 35.7* 37.1  MCV 86.3 86.6 86.0 84.6 86.9  PLT 164 143* 159 171 153   Basic Metabolic Panel: Recent Labs  Lab 12/09/17 2328 12/11/17 0309 12/12/17 0446  NA 140 139 138  K 3.7 3.6 3.8  CL 109 107 107  CO2 22 23 24   GLUCOSE 106* 115* 117*  BUN 15 12 11   CREATININE 0.69 0.59 0.55  CALCIUM 9.1 8.7* 9.0  MG  --   --  2.0   GFR: Estimated Creatinine Clearance: 118.5 mL/min (by C-G formula based on SCr of 0.55 mg/dL).  Liver Function Tests: Recent Labs  Lab 12/12/17 0446  AST 15  ALT 13  ALKPHOS 57  BILITOT 0.3  PROT 6.9  ALBUMIN 3.6   Coagulation Profile: Recent Labs  Lab 12/09/17 2328  INR 1.01   Cardiac Enzymes: Recent Labs  Lab 12/09/17 2334 12/10/17 0423 12/10/17 1147 12/10/17 2244  TROPONINI 0.08* 0.06* 0.04* 0.03*     Recent Results (from the past 240 hour(s))  MRSA PCR Screening     Status: None   Collection Time: 12/11/17  3:36 AM  Result Value Ref Range Status   MRSA by PCR NEGATIVE NEGATIVE Final    Comment:        The GeneXpert MRSA Assay (FDA approved for NASAL specimens only), is one component of a comprehensive MRSA  colonization surveillance program. It is not intended to diagnose MRSA infection nor to guide or monitor treatment for MRSA infections. Performed at [facility], [address], [place], [place] [address]          Radiology Studies: Vas [place] Lower Extremity Venous (dvt)  Result Date: 12/11/2017  Lower Venous Study Indications: Pulmonary embolism. Other Indications: CTA on 12/10/2017 confirmed Acute PE. Limitations: Body habitus. Comparison Study: 02/27/2017 Performing Technologist: Melodie Bouillon  Examination Guidelines: A complete evaluation includes B-mode imaging, spectral Doppler, color Doppler, and power Doppler as needed of all accessible portions of each vessel. Bilateral testing is considered an integral part of a complete examination. Limited examinations for reoccurring indications may be performed as noted.  Right Venous Findings: +---------+---------------+---------+-----------+----------------------+-------+          CompressibilityPhasicitySpontaneityProperties  Summary +---------+---------------+---------+-----------+----------------------+-------+ CFV      Full           Yes      Yes                                      +---------+---------------+---------+-----------+----------------------+-------+ SFJ      Full                                                             +---------+---------------+---------+-----------+----------------------+-------+ FV Prox  Full                                                             +---------+---------------+---------+-----------+----------------------+-------+ FV Mid   None                    Yes        partially             Acute                                               re-cannalized                 +---------+---------------+---------+-----------+----------------------+-------+ FV DistalFull                                                              +---------+---------------+---------+-----------+----------------------+-------+ PFV      Full                                                             +---------+---------------+---------+-----------+----------------------+-------+ POP      Partial                 Yes        partially             Acute                                               re-cannalized                 +---------+---------------+---------+-----------+----------------------+-------+ PTV      None                               dilated  Acute   +---------+---------------+---------+-----------+----------------------+-------+ PERO     None                               dilated               Acute   +---------+---------------+---------+-----------+----------------------+-------+ 3.33X3.44X0.73CM Lymph node seen at right groin area.  Left Venous Findings: +---------+---------------+---------+-----------+----------+---------+          CompressibilityPhasicitySpontaneityPropertiesSummary   +---------+---------------+---------+-----------+----------+---------+ CFV      Full           Yes      Yes                            +---------+---------------+---------+-----------+----------+---------+ SFJ      Full                                                   +---------+---------------+---------+-----------+----------+---------+ FV Prox  Full                                                   +---------+---------------+---------+-----------+----------+---------+ FV Mid   Full                                                   +---------+---------------+---------+-----------+----------+---------+ FV DistalFull                                                   +---------+---------------+---------+-----------+----------+---------+ PFV      Full                                                    +---------+---------------+---------+-----------+----------+---------+ POP      Full           Yes      Yes                  poor flow +---------+---------------+---------+-----------+----------+---------+ PTV      None                               dilated   Acute     +---------+---------------+---------+-----------+----------+---------+ PERO     None                               dilated   Acute     +---------+---------------+---------+-----------+----------+---------+    Summary: Right: Findings consistent with acute deep vein thrombosis involving the right popliteal vein, right posterior tibial vein, right peroneal vein, and right femoral vein. No cystic structure found in the popliteal fossa. Left: Findings consistent with acute deep vein thrombosis involving the left  posterior tibial vein, and left peroneal vein. No cystic structure found in the popliteal fossa.  *See table(s) above for measurements and observations. Electronically signed by Waverly Ferrari MD on 12/11/2017 at 5:12:03 PM.    Final         Scheduled Meds: Continuous Infusions: . sodium chloride Stopped (12/10/17 0503)  . heparin 1,550 Units/hr (12/12/17 0538)     LOS: 2 days    Osvaldo Shipper, MD [facility] Pager [phone]  If 7PM-7AM, please contact night-coverage [email or web address] Password [email or web address] 12/12/2017, 1:18 PM

## 2017-12-12 NOTE — Progress Notes (Signed)
ANTICOAGULATION CONSULT NOTE - Follow Up Consult  Pharmacy Consult for Heparin Indication: pulmonary embolus  Allergies  Allergen Reactions  . Oxaprozin     angioedema  . Sulfonamide Derivatives     REACTION: hives  . Orange Juice [Orange Oil] Hives    Orange juice  . Tomato Hives    tomatoes  . Latex Rash  . Nsaids Rash    Patient Measurements: Height: 5\' 7"  (170.2 cm) Weight: 267 lb (121.1 kg) IBW/kg (Calculated) : 61.6 Heparin Dosing Weight: 90 kg  Vital Signs: Temp: 97.9 F (36.6 C) (12/11 0536) Temp Source: Oral (12/11 0536) BP: 118/78 (12/11 0536) Pulse Rate: 60 (12/11 0536)  Labs: Recent Labs    12/09/17 2328  12/10/17 0423 12/10/17 1147  12/10/17 2244 12/11/17 0309 12/11/17 0847 12/12/17 0446  HGB 11.8*  --  11.5* 12.0  --   --  11.4*  --  11.8*  HCT 36.6  --  36.3 37.4  --   --  35.7*  --  37.1  PLT 164  --  143* 159  --   --  171  --  153  LABPROT 13.2  --   --   --   --   --   --   --   --   INR 1.01  --   --   --   --   --   --   --   --   HEPARINUNFRC  --   --   --  0.37   < >  --  0.45 0.49 0.59  CREATININE 0.69  --   --   --   --   --  0.59  --  0.55  TROPONINI  --    < > 0.06* 0.04*  --  0.03*  --   --   --    < > = values in this interval not displayed.    Estimated Creatinine Clearance: 118.5 mL/min (by C-G formula based on SCr of 0.55 mg/dL).   Medications:  Infusions:  . sodium chloride Stopped (12/10/17 0503)  . heparin 1,550 Units/hr (12/12/17 [id number])    Assessment: [age] yr female presents with chest pain and shortness of breath.  PMH significant for recurrent PE and DVT.  She has been on warfarin, Xarelto, and most recently Pradaxa, but has not taken in > 2 weeks d/t running out (insurance/cost issues).  Pharmacy has been consulted for heparin dosing. 12/9 CT angio: positive for PE with R heart strain  12/12/17, Today  HL = 0.59 is therapeutic on heparin infusion of 1550 units/hr   CBC: Hgb 11.8, Plt 153, stable  No bleeding  or line issues per RN  Goal of Therapy:  Heparin level 0.3-0.7 units/ml Monitor platelets by anticoagulation protocol: Yes   Plan:   Continue heparin IV infusion @ 1550 units/hr  Daily heparin level and CBC  Continue to monitor H&H and platelets  Monitor for s/s of bleeding   Cindi CarbonMary M , PharmD 12/12/17 7:09 AM

## 2017-12-12 NOTE — Progress Notes (Signed)
Pradaxa's toll free number [phone] was given to pt to call for assistance. Pradaxa company approved pt for 30 days supply of Pradaxa. Confirmation e-mail Card from [employer] was sent to pt's e-mail. Pt shared e-mail with this CM, Card ID, Group, BIN number was sent to pt for 30 days supply of Pradaxa.

## 2017-12-13 DIAGNOSIS — I2609 Other pulmonary embolism with acute cor pulmonale: Principal | ICD-10-CM

## 2017-12-13 LAB — CBC
HCT: 37.9 % (ref 36.0–46.0)
Hemoglobin: 11.9 g/dL — ABNORMAL LOW (ref 12.0–15.0)
MCH: 26.9 pg (ref 26.0–34.0)
MCHC: 31.4 g/dL (ref 30.0–36.0)
MCV: 85.6 fL (ref 80.0–100.0)
Platelets: 169 10*3/uL (ref 150–400)
RBC: 4.43 MIL/uL (ref 3.87–5.11)
RDW: 14.6 % (ref 11.5–15.5)
WBC: 5.4 10*3/uL (ref 4.0–10.5)
nRBC: 0 % (ref 0.0–0.2)

## 2017-12-13 LAB — HEPARIN LEVEL (UNFRACTIONATED): Heparin Unfractionated: 0.7 IU/mL (ref 0.30–0.70)

## 2017-12-13 MED ORDER — DABIGATRAN ETEXILATE MESYLATE 150 MG PO CAPS: 150.0000 mg | ORAL_CAPSULE | Freq: Two times a day (BID) | ORAL | 2 refills | Status: DC

## 2017-12-13 MED ORDER — DABIGATRAN ETEXILATE MESYLATE 150 MG PO CAPS
150.0000 mg | ORAL_CAPSULE | Freq: Two times a day (BID) | ORAL | Status: DC
  Administered 2017-12-13: 150 mg via ORAL
  Filled 2017-12-13: qty 1

## 2017-12-13 MED ORDER — HEPARIN (PORCINE) 25000 UT/250ML-% IV SOLN
1500.0000 [IU]/h | INTRAVENOUS | Status: AC
  Administered 2017-12-13: 1500 [IU]/h via INTRAVENOUS

## 2017-12-13 NOTE — Progress Notes (Addendum)
ANTICOAGULATION CONSULT NOTE - Follow Up Consult  Pharmacy Consult for Heparin Indication: pulmonary embolus  Allergies  Allergen Reactions  . Oxaprozin     angioedema  . Sulfonamide Derivatives     REACTION: hives  . Orange Juice [Orange Oil] Hives    Orange juice  . Tomato Hives    tomatoes  . Latex Rash  . Nsaids Rash    Patient Measurements: Height: 5\' 7"  (170.2 cm) Weight: 267 lb (121.1 kg) IBW/kg (Calculated) : 61.6 Heparin Dosing Weight: 90 kg  Vital Signs: Temp: 97.6 F (36.4 C) (12/12 0352) Temp Source: Oral (12/12 0352) BP: 105/80 (12/12 0352) Pulse Rate: 57 (12/12 0352)  Labs: Recent Labs    12/10/17 1147  12/10/17 2244 12/11/17 0309 12/11/17 0847 12/12/17 0446 12/13/17 0450  HGB 12.0  --   --  11.4*  --  11.8* 11.9*  HCT 37.4  --   --  35.7*  --  37.1 37.9  PLT 159  --   --  171  --  153 169  HEPARINUNFRC 0.37   < >  --  0.45 0.49 0.59 0.70  CREATININE  --   --   --  0.59  --  0.55  --   TROPONINI 0.04*  --  0.03*  --   --   --   --    < > = values in this interval not displayed.    Estimated Creatinine Clearance: 118.5 mL/min (by C-G formula based on SCr of 0.55 mg/dL).   Medications:  Infusions:  . sodium chloride Stopped (12/10/17 0503)  . heparin      Assessment: [age] yr female presents with chest pain and shortness of breath.  PMH significant for recurrent PE and DVT.  She has been on warfarin, Xarelto, and most recently Pradaxa, but has not taken in > 2 weeks d/t running out (insurance/cost issues).  Pharmacy has been consulted for heparin dosing. 12/9 CT angio: positive for PE with R heart strain  12/13/17, Today  HL = 0.7 is therapeutic on heparin infusion of 1550 units/hr, but on the upper end of therapeutic range  CBC: Hgb 11.9, Plt 169 are stable  No bleeding or line issues per RN  Goal of Therapy:  Heparin level 0.3-0.7 units/ml Monitor platelets by anticoagulation protocol: Yes   Plan:   Slightly decrease heparin IV  infusion to 1500 units/hr  Check HL in 6 hours  Daily heparin level and CBC  Continue to monitor H&H and platelets  Monitor for s/s of bleeding  Cindi CarbonMary M , PharmD Clinical Pharmacist 12/13/17 7:12 AM  Addendum:  Received verbal order from MD to convert from heparin drip to pradaxa 150 mg PO BID.  -Turn off heparin drip at time that 1st pradaxa dose is given -Pradaxa 150 mg PO BID  Cindi CarbonMary M , PharmD 12/13/17 11:04 AM

## 2017-12-13 NOTE — Discharge Summary (Signed)
[facility]  Physician Discharge Summary   Patient ID: Hannah Thompson MRN: [id number] DOB/AGE: 08-29-1970 47 y.o.  Admit date: 12/10/2017 Discharge date: 12/13/2017  PCP: Corwin LevinsJohn, James W, MD  DISCHARGE DIAGNOSES:  Recurrent acute pulmonary embolism Bilateral lower extremity DVT  RECOMMENDATIONS FOR OUTPATIENT FOLLOW UP: 1. Patient instructed to follow-up with her hematologist, Dr. Myna HidalgoEnnever, in a few weeks time 2. Patient has been given vouchers/coupon for Pradaxa 3. Compliance was emphasized to the patient   DISCHARGE CONDITION: fair  Diet recommendation: As before  [facility]   12/09/17 2318 12/10/17 2235 12/12/17 0536  Weight: 124.7 kg 120.1 kg 121.1 kg    INITIAL HISTORY: Hannah PearlWanda R Wilsonis a 47 y.o.femalewithhistory of recurrent pulmonary embolism who has not been taking her Pradaxa for last 2 weeks due to financial issues presents to the ER because of worsening chest pain and shortness of breath for last 4 days.   Patient found to have acute PE.  Also found to have bilateral lower extremity DVT.   Consultations:  Phone discussion with Dr. Myna HidalgoEnnever  Procedures: LE US Summary: Right: Findings consistent with acute deep vein thrombosis involving the right popliteal vein, right posterior tibial vein, right peroneal vein, and right femoral vein. No cystic structure found in the popliteal fossa. Left: Findings consistent with acute deep vein thrombosis involving the left posterior tibial vein, and left peroneal vein. No cystic structure found in the popliteal fossa.  Echo Study Conclusions  - Left ventricle: The cavity size was normal. Systolic function was normal. The estimated ejection fraction was in the range of 55% to 60%. Wall motion was normal; there were no regional wall motion abnormalities. Doppler parameters are consistent with abnormal left ventricular relaxation (grade 1 diastolic dysfunction). - Aortic valve: Transvalvular  velocity was within the normal range. There was no stenosis. There was no regurgitation. Valve area (VTI): 3.9 cm^2. Valve area (Vmax): 3.47 cm^2. Valve area (Vmean): 3.65 cm^2. - Aorta: Ascending aortic diameter: 38 mm (S). - Ascending aorta: The ascending aorta was mildly dilated. - Mitral valve: Transvalvular velocity was within the normal range. There was no evidence for stenosis. There was trivial regurgitation. - Right ventricle: The cavity size was normal. Wall thickness was normal. Systolic function was normal. - Tricuspid valve: There was trivial regurgitation. - Pulmonary arteries: Systolic pressure was within the normal range. PA peak pressure: 26 mm Hg (S).   HOSPITAL COURSE:   Submassive Pulmonary embolism with history of recurrent VTE  Patient had not been taking her Pradaxa due to financial reasons.  She did not think of reaching out to her primary care provider or her hematologist.  This is the most likely reason for her recurrent PE and new DVTs.  CT scan suggested right heart strain.  No such concern identified on echocardiogram.  Patient was started on IV heparin.  She tells me that she is going to get back on her insurance starting January.  The situation was discussed with Dr. Myna HidalgoEnnever.  She saw him last in April.  He recommends putting her back on Pradaxa and he will see her in his office. She previously has had failure of treatment with warfarin and Xarelto but this remains unclear.  Patient has been hemodynamically stable.  She is been ambulating without any shortness of breath.  Saturating normal on room air.  Okay to transition to Pradaxa as recommended by the hematologist.  She was on 110 mg of Pradaxa twice a day which is a maintenance dose.  She will  be discharged on full therapeutic dose of 150 mg twice a day.   Normocytic normochromic anemia Hemoglobin is stable.  No evidence of overt bleeding.  Grade 1 diastolic dysfunction Noted on  echocardiogram done during this hospitalization.  Currently well compensated.    Vascular Malformations Incidentally noted on CT scan.  Stable as per report.    Overall stable.  Okay for discharge home today.    PERTINENT LABS:  The results of significant diagnostics from this hospitalization (including imaging, microbiology, ancillary and laboratory) are listed below for reference.    Microbiology: Recent Results (from the past 240 hour(s))  MRSA PCR Screening     Status: None   Collection Time: 12/11/17  3:36 AM  Result Value Ref Range Status   MRSA by PCR NEGATIVE NEGATIVE Final    Comment:        The GeneXpert MRSA Assay (FDA approved for NASAL specimens only), is one component of a comprehensive MRSA colonization surveillance program. It is not intended to diagnose MRSA infection nor to guide or monitor treatment for MRSA infections. Performed at [facility], [address] [place], [place] [address]      Labs: Basic Metabolic Panel: Recent Labs  Lab 12/09/17 2328 12/11/17 0309 12/12/17 0446  NA 140 139 138  K 3.7 3.6 3.8  CL 109 107 107  CO2 22 23 24   GLUCOSE 106* 115* 117*  BUN 15 12 11   CREATININE 0.69 0.59 0.55  CALCIUM 9.1 8.7* 9.0  MG  --   --  2.0   Liver Function Tests: Recent Labs  Lab 12/12/17 0446  AST 15  ALT 13  ALKPHOS 57  BILITOT 0.3  PROT 6.9  ALBUMIN 3.6   CBC: Recent Labs  Lab 12/10/17 0423 12/10/17 1147 12/11/17 0309 12/12/17 0446 12/13/17 0450  WBC 6.6 6.5 5.8 6.0 5.4  HGB 11.5* 12.0 11.4* 11.8* 11.9*  HCT 36.3 37.4 35.7* 37.1 37.9  MCV 86.6 86.0 84.6 86.9 85.6  PLT 143* 159 171 153 169   Cardiac Enzymes: Recent Labs  Lab 12/09/17 2334 12/10/17 0423 12/10/17 1147 12/10/17 2244  TROPONINI 0.08* 0.06* 0.04* 0.03*   BNP: BNP (last 3 results) Recent Labs    12/10/17 0423  BNP 20.8     IMAGING STUDIES Dg Chest 2 View  Result Date: 12/09/2017 CLINICAL DATA:  Right chest pain and  shortness of breath for the past 3 days. Smoker. EXAM: CHEST - 2 VIEW COMPARISON:  02/26/2017 and chest CTA dated 04/30/2017. FINDINGS: The cardiac silhouette remains borderline enlarged. Tortuous aorta. Clear lungs with normal vascularity. Unremarkable bones. IMPRESSION: No acute abnormality. Electronically Signed   By: Beckie Salts M.D.   On: 12/09/2017 23:38   Ct Angio Chest Pe W And/or Wo Contrast  Result Date: 12/10/2017 CLINICAL DATA:  Chest pain. Dyspnea. History of pulmonary embolism. EXAM: CT ANGIOGRAPHY CHEST WITH CONTRAST TECHNIQUE: Multidetector CT imaging of the chest was performed using the standard protocol during bolus administration of intravenous contrast. Multiplanar CT image reconstructions and MIPs were obtained to evaluate the vascular anatomy. CONTRAST:  ISOVUE-370 IOPAMIDOL (ISOVUE-370) INJECTION 76% COMPARISON:  Chest radiographs obtained yesterday. Chest CTA dated 04/30/2017. FINDINGS: Cardiovascular: Multiple bilateral pulmonary arterial filling defects of varying sizes, not previously present. These filling defects or larger on the right compared to the left. Dilated right ventricle with a right ventricular to left ventricular ratio of 1.53. Mediastinum/Nodes: No enlarged mediastinal, hilar, or axillary lymph nodes. The previously demonstrated 2.4 x 1.3 cm oval  soft tissue density in the right pre vascular space measures 2.4 x 1.0 cm today. Again, this most likely vascular in nature. Thyroid gland, trachea, and esophagus demonstrate no significant findings. 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 Otherwise, no lung nodules are seen. No airspace consolidation or pleural fluid. Upper Abdomen: Unremarkable. Musculoskeletal: Thoracic spine degenerative changes. Review of the MIP images confirms the above findings. IMPRESSION: 1. Positive for acute PE with CT evidence of right heart strain (RV/LV Ratio = 1.53) consistent with  at least submassive (intermediate risk) PE. The presence of right heart strain has been associated with an increased risk of morbidity and mortality. Please activate Code PE by paging [phone]. 2. 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 Critical Value/emergent results were called by telephone at the time of interpretation on 12/10/2017 at 2:13 am to Dr. Zadie Rhine , who verbally acknowledged these results. Electronically Signed   By: Beckie Salts M.D.   On: 12/10/2017 02:18   Vas [place] Lower Extremity Venous (dvt)  Result Date: 12/11/2017  Lower Venous Study Indications: Pulmonary embolism. Other Indications: CTA on 12/10/2017 confirmed Acute PE. Limitations: Body habitus. Comparison Study: 02/27/2017 Performing Technologist: Melodie Bouillon  Examination Guidelines: A complete evaluation includes B-mode imaging, spectral Doppler, color Doppler, and power Doppler as needed of all accessible portions of each vessel. Bilateral testing is considered an integral part of a complete examination. Limited examinations for reoccurring indications may be performed as noted.  Right Venous Findings: +---------+---------------+---------+-----------+----------------------+-------+          CompressibilityPhasicitySpontaneityProperties            Summary +---------+---------------+---------+-----------+----------------------+-------+ CFV      Full           Yes      Yes                                      +---------+---------------+---------+-----------+----------------------+-------+ SFJ      Full                                                             +---------+---------------+---------+-----------+----------------------+-------+ FV Prox  Full                                                              +---------+---------------+---------+-----------+----------------------+-------+ FV Mid   None                    Yes        partially             Acute                                               re-cannalized                 +---------+---------------+---------+-----------+----------------------+-------+  FV DistalFull                                                             +---------+---------------+---------+-----------+----------------------+-------+ PFV      Full                                                             +---------+---------------+---------+-----------+----------------------+-------+ POP      Partial                 Yes        partially             Acute                                               re-cannalized                 +---------+---------------+---------+-----------+----------------------+-------+ PTV      None                               dilated               Acute   +---------+---------------+---------+-----------+----------------------+-------+ PERO     None                               dilated               Acute   +---------+---------------+---------+-----------+----------------------+-------+ 3.33X3.44X0.73CM Lymph node seen at right groin area.  Left Venous Findings: +---------+---------------+---------+-----------+----------+---------+          CompressibilityPhasicitySpontaneityPropertiesSummary   +---------+---------------+---------+-----------+----------+---------+ CFV      Full           Yes      Yes                            +---------+---------------+---------+-----------+----------+---------+ SFJ      Full                                                   +---------+---------------+---------+-----------+----------+---------+ FV Prox  Full                                                   +---------+---------------+---------+-----------+----------+---------+ FV Mid   Full                                                    +---------+---------------+---------+-----------+----------+---------+ FV  DistalFull                                                   +---------+---------------+---------+-----------+----------+---------+ PFV      Full                                                   +---------+---------------+---------+-----------+----------+---------+ POP      Full           Yes      Yes                  poor flow +---------+---------------+---------+-----------+----------+---------+ PTV      None                               dilated   Acute     +---------+---------------+---------+-----------+----------+---------+ PERO     None                               dilated   Acute     +---------+---------------+---------+-----------+----------+---------+    Summary: Right: Findings consistent with acute deep vein thrombosis involving the right popliteal vein, right posterior tibial vein, right peroneal vein, and right femoral vein. No cystic structure found in the popliteal fossa. Left: Findings consistent with acute deep vein thrombosis involving the left posterior tibial vein, and left peroneal vein. No cystic structure found in the popliteal fossa.  *See table(s) above for measurements and observations. Electronically signed by Waverly Ferrari MD on 12/11/2017 at 5:12:03 PM.    Final     DISCHARGE EXAMINATION: Vitals:   12/12/17 0536 12/12/17 1241 12/12/17 2036 12/13/17 0352  BP: 118/78 116/68 105/70 105/80  Pulse: 60 66 73 (!) 57  Resp: 16  18 18   Temp: 97.9 F (36.6 C) 98.6 F (37 C) 97.9 F (36.6 C) 97.6 F (36.4 C)  TempSrc: Oral Oral Oral Oral  SpO2: 94% 91% 96% 94%  Weight: 121.1 kg     Height:       General appearance: alert, cooperative, appears stated age and no distress Resp: clear to auscultation bilaterally Cardio: regular rate and rhythm, S1, S2 normal, no murmur, click, rub or gallop GI: soft, non-tender;  bowel sounds normal; no masses,  no organomegaly  DISPOSITION: Home  Discharge Instructions    Call MD for:  difficulty breathing, headache or visual disturbances   Complete by:  As directed    Call MD for:  extreme fatigue   Complete by:  As directed    Call MD for:  persistant dizziness or light-headedness   Complete by:  As directed    Call MD for:  persistant nausea and vomiting   Complete by:  As directed    Call MD for:  severe uncontrolled pain   Complete by:  As directed    Call MD for:  temperature >100.4   Complete by:  As directed    Diet - low sodium heart healthy   Complete by:  As directed    Discharge instructions   Complete by:  As directed    Please be sure to follow-up with  Dr. Myna Hidalgo.  Please contact him or your primary care provider if you are running out of the Pradaxa and dont have a way of getting it.  You were cared for by a hospitalist during your hospital stay. If you have any questions about your discharge medications or the care you received while you were in the hospital after you are discharged, you can call the unit and asked to speak with the hospitalist on call if the hospitalist that took care of you is not available. Once you are discharged, your primary care physician will handle any further medical issues. Please note that NO REFILLS for any discharge medications will be authorized once you are discharged, as it is imperative that you return to your primary care physician (or establish a relationship with a primary care physician if you do not have one) for your aftercare needs so that they can reassess your need for medications and monitor your lab values. If you do not have a primary care physician, you can call [phone] for a physician referral.   Increase activity slowly   Complete by:  As directed        Allergies as of 12/13/2017      Reactions   Oxaprozin    angioedema   Sulfonamide Derivatives    REACTION: hives   Orange Juice [orange  Oil] Hives   Orange juice   Tomato Hives   tomatoes   Latex Rash   Nsaids Rash      Medication List    STOP taking these medications   cetirizine 10 MG tablet Commonly known as:  ZYRTEC   EPINEPHrine 0.3 mg/0.3 mL Devi Commonly known as:  EPI-PEN   polyethylene glycol packet Commonly known as:  MIRALAX / GLYCOLAX   triamcinolone 55 MCG/ACT Aero nasal inhaler Commonly known as:  NASACORT AQ     TAKE these medications   acetaminophen 325 MG tablet Commonly known as:  TYLENOL Take 2 tablets (650 mg total) by mouth every 6 (six) hours as needed for mild pain, moderate pain or headache (or Fever >/= 101).   dabigatran 150 MG Caps capsule Commonly known as:  PRADAXA Take 1 capsule (150 mg total) by mouth every 12 (twelve) hours. What changed:    medication strength  how much to take        Follow-up Information    Ennever, Rose Phi, MD. Schedule an appointment as soon as possible for a visit in 4 week(s).   Specialty:  Oncology Why:  call for appointment Contact information: [address] [place] [address] [phone]        Corwin Levins, MD. Schedule an appointment as soon as possible for a visit in 1 week(s).   Specialties:  Internal Medicine, Radiology Contact information: [address] [place]be [facility] [place] [address] [phone]           TOTAL DISCHARGE TIME: 35 mins  Osvaldo Shipper  [facility] Pager [phone]  12/13/2017, 3:29 PM

## 2018-01-07 ENCOUNTER — Inpatient Hospital Stay: Payer: BLUE CROSS/BLUE SHIELD | Attending: Family

## 2018-01-07 ENCOUNTER — Telehealth: Payer: Self-pay | Admitting: Family

## 2018-01-07 ENCOUNTER — Other Ambulatory Visit: Payer: Self-pay

## 2018-01-07 ENCOUNTER — Inpatient Hospital Stay (HOSPITAL_BASED_OUTPATIENT_CLINIC_OR_DEPARTMENT_OTHER): Payer: BLUE CROSS/BLUE SHIELD | Admitting: Family

## 2018-01-07 ENCOUNTER — Encounter: Payer: Self-pay | Admitting: Family

## 2018-01-07 VITALS — BP 124/75 | HR 61 | Temp 98.2°F | Resp 19 | Ht 67.0 in | Wt 275.5 lb

## 2018-01-07 DIAGNOSIS — D696 Thrombocytopenia, unspecified: Secondary | ICD-10-CM

## 2018-01-07 DIAGNOSIS — I82403 Acute embolism and thrombosis of unspecified deep veins of lower extremity, bilateral: Secondary | ICD-10-CM

## 2018-01-07 DIAGNOSIS — I2692 Saddle embolus of pulmonary artery without acute cor pulmonale: Secondary | ICD-10-CM | POA: Diagnosis not present

## 2018-01-07 DIAGNOSIS — D72819 Decreased white blood cell count, unspecified: Secondary | ICD-10-CM

## 2018-01-07 DIAGNOSIS — Z7901 Long term (current) use of anticoagulants: Secondary | ICD-10-CM | POA: Diagnosis not present

## 2018-01-07 DIAGNOSIS — I82491 Acute embolism and thrombosis of other specified deep vein of right lower extremity: Secondary | ICD-10-CM

## 2018-01-07 DIAGNOSIS — I824Y2 Acute embolism and thrombosis of unspecified deep veins of left proximal lower extremity: Secondary | ICD-10-CM

## 2018-01-07 LAB — CBC WITH DIFFERENTIAL (CANCER CENTER ONLY)
Abs Immature Granulocytes: 0.01 10*3/uL (ref 0.00–0.07)
Basophils Absolute: 0 10*3/uL (ref 0.0–0.1)
Basophils Relative: 0 %
Eosinophils Absolute: 0.3 10*3/uL (ref 0.0–0.5)
Eosinophils Relative: 6 %
HCT: 35.7 % — ABNORMAL LOW (ref 36.0–46.0)
HEMOGLOBIN: 11.4 g/dL — AB (ref 12.0–15.0)
Immature Granulocytes: 0 %
Lymphocytes Relative: 51 %
Lymphs Abs: 2.5 10*3/uL (ref 0.7–4.0)
MCH: 27.4 pg (ref 26.0–34.0)
MCHC: 31.9 g/dL (ref 30.0–36.0)
MCV: 85.8 fL (ref 80.0–100.0)
Monocytes Absolute: 0.3 10*3/uL (ref 0.1–1.0)
Monocytes Relative: 6 %
Neutro Abs: 1.8 10*3/uL (ref 1.7–7.7)
Neutrophils Relative %: 37 %
Platelet Count: 205 10*3/uL (ref 150–400)
RBC: 4.16 MIL/uL (ref 3.87–5.11)
RDW: 14.7 % (ref 11.5–15.5)
WBC Count: 4.9 10*3/uL (ref 4.0–10.5)
nRBC: 0 % (ref 0.0–0.2)

## 2018-01-07 LAB — LACTATE DEHYDROGENASE: LDH: 151 U/L (ref 98–192)

## 2018-01-07 LAB — CMP (CANCER CENTER ONLY)
ALK PHOS: 60 U/L (ref 38–126)
ALT: 12 U/L (ref 0–44)
AST: 13 U/L — ABNORMAL LOW (ref 15–41)
Albumin: 4 g/dL (ref 3.5–5.0)
Anion gap: 6 (ref 5–15)
BUN: 10 mg/dL (ref 6–20)
CALCIUM: 8.8 mg/dL — AB (ref 8.9–10.3)
CO2: 25 mmol/L (ref 22–32)
Chloride: 106 mmol/L (ref 98–111)
Creatinine: 0.69 mg/dL (ref 0.44–1.00)
GFR, Est AFR Am: >60 mL/min (ref >60)
GFR, Estimated: >60 mL/min (ref >60)
Glucose, Bld: 110 mg/dL — ABNORMAL HIGH (ref 70–99)
Potassium: 4.3 mmol/L (ref 3.5–5.1)
Sodium: 137 mmol/L (ref 135–145)
Total Bilirubin: 0.3 mg/dL (ref 0.3–1.2)
Total Protein: 7.1 g/dL (ref 6.5–8.1)

## 2018-01-07 MED ORDER — DABIGATRAN ETEXILATE MESYLATE 150 MG PO CAPS: 150.0000 mg | ORAL_CAPSULE | Freq: Two times a day (BID) | ORAL | 11 refills | Status: DC

## 2018-01-07 NOTE — Progress Notes (Signed)
Hematology and Oncology Follow Up Visit  Hannah Thompson [id number] 11/03/1970 47 y.o. 01/07/2018   Principle Diagnosis:  Recurrent PE with right heart stain - Idiopathic Bilateral lower extremity DVT Ethnic associated leukopenia Transient thrombocytopenia  Current Therapy:   Pradaxa 110 mg po BID - long term maintanence    Interim History:  Hannah Thompson is here today for follow-up. She was hospitalized in December with recurrent PE and bilateral lower extremity PE's. She had not been taking her Pradaxa but has since restarted.  She verbalized that she is taking her Pradaxa PO BID as prescribed.  She is doing well and has no complaints at this time.  No episodes of bleeding, no bruising or petechiae.  No fever, chills, n/v, cough, rash, dizziness, SOB, chest pain, palpitations, abdominal pain or changes in bowel or bladder habits.  No swelling, tenderness, numbness or tingling in her extremities.  No lymphadenopathy noted on exam.  She has maintained a good appetite and is staying well hydrated. Her weight is stable.   ECOG Performance Status: 0 - Asymptomatic  Medications:  Allergies as of 01/07/2018      Reactions   Oxaprozin    angioedema   Sulfonamide Derivatives    REACTION: hives   Orange Juice [orange Oil] Hives   Orange juice   Tomato Hives   tomatoes   Latex Rash   Nsaids Rash      Medication List       Accurate as of January 07, 2018 11:08 AM. Always use your most recent med list.        acetaminophen 325 MG tablet Commonly known as:  TYLENOL Take 2 tablets (650 mg total) by mouth every 6 (six) hours as needed for mild pain, moderate pain or headache (or Fever >/= 101).   dabigatran 150 MG Caps capsule Commonly known as:  PRADAXA Take 1 capsule (150 mg total) by mouth every 12 (twelve) hours.       Allergies:  Allergies  Allergen Reactions  . Oxaprozin     angioedema  . Sulfonamide Derivatives     REACTION: hives  . Orange Juice [Orange Oil] Hives     Orange juice  . Tomato Hives    tomatoes  . Latex Rash  . Nsaids Rash    Past Medical History, Surgical history, Social history, and Family History were reviewed and updated.  Review of Systems: All other 10 point review of systems is negative.   Physical Exam:  height is 5\' 7"  (1.702 m) and weight is 275 lb 8 oz (125 kg). Her temperature is 98.2 F (36.8 C). Her blood pressure is 124/75 and her pulse is 61. Her respiration is 19 and oxygen saturation is 97%.   Wt Readings from Last 3 Encounters:  01/07/18 275 lb 8 oz (125 kg)  12/12/17 267 lb (121.1 kg)  05/21/17 277 lb (125.6 kg)    Ocular: Sclerae unicteric, pupils equal, round and reactive to light Ear-nose-throat: Oropharynx clear, dentition fair Lymphatic: No cervical, supraclavicular or axillary adenopathy Lungs no rales or rhonchi, good excursion bilaterally Heart regular rate and rhythm, no murmur appreciated Abd soft, nontender, positive bowel sounds, no liver or spleen tip palpated on exam, no fluid wave  MSK no focal spinal tenderness, no joint edema Neuro: non-focal, well-oriented, appropriate affect Breasts: Deferred   Lab Results  Component Value Date   WBC 4.9 01/07/2018   HGB 11.4 (L) 01/07/2018   HCT 35.7 (L) 01/07/2018   MCV 85.8 01/07/2018  PLT 205 01/07/2018   Lab Results  Component Value Date   FERRITIN 242 08/16/2015   IRON 76 08/16/2015   TIBC 251 08/16/2015   UIBC 175 08/16/2015   IRONPCTSAT 30 08/16/2015   Lab Results  Component Value Date   RBC 4.16 01/07/2018   No results found for: KPAFRELGTCHN, LAMBDASER, KAPLAMBRATIO No results found for: Loel Lofty, IGMSERUM No results found for: Marda Stalker, SPEI   Chemistry      Component Value Date/Time   NA 137 01/07/2018 1022   NA 139 10/23/2016 1306   NA 140 09/25/2016 1145   K 4.3 01/07/2018 1022   K 3.8 10/23/2016 1306   K 4.1 09/25/2016 1145   CL 106 01/07/2018 1022     CL 107 10/23/2016 1306   CO2 25 01/07/2018 1022   CO2 26 10/23/2016 1306   CO2 28 09/25/2016 1145   BUN 10 01/07/2018 1022   BUN 8 10/23/2016 1306   BUN 9.5 09/25/2016 1145   CREATININE 0.69 01/07/2018 1022   CREATININE 0.8 10/23/2016 1306   CREATININE 0.8 09/25/2016 1145      Component Value Date/Time   CALCIUM 8.8 (L) 01/07/2018 1022   CALCIUM 8.7 10/23/2016 1306   CALCIUM 9.6 09/25/2016 1145   ALKPHOS 60 01/07/2018 1022   ALKPHOS 67 10/23/2016 1306   ALKPHOS 72 09/25/2016 1145   AST 13 (L) 01/07/2018 1022   AST 17 09/25/2016 1145   ALT 12 01/07/2018 1022   ALT 26 10/23/2016 1306   ALT 11 09/25/2016 1145   BILITOT 0.3 01/07/2018 1022   BILITOT 0.26 09/25/2016 1145       Impression and Plan: Hannah Thompson is a very pleasant 48 yo African [place] female with significant history of thrombus and most recently pulmonary embolism with right heart strain as well as bilateral lower extremity DVT's while off of Pradaxa for 2 weeks.  She is now back on her Pradaxa PO BID and is doing well. She has no complaints at this time.  We will plan to see her back in 2 months for repeat scans and MD follow-up.  She will contact our office with any questions or concerns. We can certainly see her sooner if need be.   Emeline Gins, NP 1/6/202011:08 AM

## 2018-01-07 NOTE — Telephone Encounter (Signed)
Appointments scheduled avs/calendar printed per 01/07/2018 los  °

## 2018-01-17 ENCOUNTER — Other Ambulatory Visit: Payer: Self-pay | Admitting: *Deleted

## 2018-01-17 DIAGNOSIS — I82491 Acute embolism and thrombosis of other specified deep vein of right lower extremity: Secondary | ICD-10-CM

## 2018-01-17 DIAGNOSIS — I2692 Saddle embolus of pulmonary artery without acute cor pulmonale: Secondary | ICD-10-CM

## 2018-01-17 MED ORDER — DABIGATRAN ETEXILATE MESYLATE 150 MG PO CAPS: 150.0000 mg | ORAL_CAPSULE | Freq: Two times a day (BID) | ORAL | 11 refills | Status: DC

## 2018-03-07 ENCOUNTER — Other Ambulatory Visit: Payer: Self-pay

## 2018-03-07 ENCOUNTER — Inpatient Hospital Stay: Payer: BLUE CROSS/BLUE SHIELD | Attending: Family

## 2018-03-07 ENCOUNTER — Inpatient Hospital Stay: Payer: BLUE CROSS/BLUE SHIELD | Admitting: Hematology & Oncology

## 2018-03-07 ENCOUNTER — Encounter (HOSPITAL_BASED_OUTPATIENT_CLINIC_OR_DEPARTMENT_OTHER): Payer: Self-pay

## 2018-03-07 ENCOUNTER — Ambulatory Visit (HOSPITAL_BASED_OUTPATIENT_CLINIC_OR_DEPARTMENT_OTHER)
Admission: RE | Admit: 2018-03-07 | Discharge: 2018-03-07 | Disposition: A | Discharge status: Home / Self Care | Payer: BLUE CROSS/BLUE SHIELD | Source: Ambulatory Visit | Attending: Family | Admitting: Family

## 2018-03-07 ENCOUNTER — Encounter: Payer: Self-pay | Admitting: Hematology & Oncology

## 2018-03-07 VITALS — BP 138/74 | HR 70 | Temp 98.0°F | Resp 18 | Wt 276.0 lb

## 2018-03-07 DIAGNOSIS — Z7901 Long term (current) use of anticoagulants: Secondary | ICD-10-CM | POA: Insufficient documentation

## 2018-03-07 DIAGNOSIS — I2692 Saddle embolus of pulmonary artery without acute cor pulmonale: Secondary | ICD-10-CM

## 2018-03-07 DIAGNOSIS — I82491 Acute embolism and thrombosis of other specified deep vein of right lower extremity: Secondary | ICD-10-CM | POA: Diagnosis not present

## 2018-03-07 DIAGNOSIS — D696 Thrombocytopenia, unspecified: Secondary | ICD-10-CM | POA: Insufficient documentation

## 2018-03-07 DIAGNOSIS — Z86711 Personal history of pulmonary embolism: Secondary | ICD-10-CM

## 2018-03-07 DIAGNOSIS — I2601 Septic pulmonary embolism with acute cor pulmonale: Secondary | ICD-10-CM

## 2018-03-07 DIAGNOSIS — I824Y2 Acute embolism and thrombosis of unspecified deep veins of left proximal lower extremity: Secondary | ICD-10-CM

## 2018-03-07 DIAGNOSIS — I82403 Acute embolism and thrombosis of unspecified deep veins of lower extremity, bilateral: Secondary | ICD-10-CM | POA: Insufficient documentation

## 2018-03-07 DIAGNOSIS — D72819 Decreased white blood cell count, unspecified: Secondary | ICD-10-CM | POA: Insufficient documentation

## 2018-03-07 LAB — CMP (CANCER CENTER ONLY)
ALBUMIN: 4.2 g/dL (ref 3.5–5.0)
ALT: 10 U/L (ref 0–44)
AST: 12 U/L — ABNORMAL LOW (ref 15–41)
Alkaline Phosphatase: 60 U/L (ref 38–126)
Anion gap: 5 (ref 5–15)
BUN: 10 mg/dL (ref 6–20)
CO2: 28 mmol/L (ref 22–32)
Calcium: 9.8 mg/dL (ref 8.9–10.3)
Chloride: 105 mmol/L (ref 98–111)
Creatinine: 0.67 mg/dL (ref 0.44–1.00)
GFR, Est AFR Am: >60 mL/min (ref >60)
GFR, Estimated: >60 mL/min (ref >60)
GLUCOSE: 119 mg/dL — AB (ref 70–99)
Potassium: 4.8 mmol/L (ref 3.5–5.1)
Sodium: 138 mmol/L (ref 135–145)
Total Bilirubin: 0.3 mg/dL (ref 0.3–1.2)
Total Protein: 7.6 g/dL (ref 6.5–8.1)

## 2018-03-07 LAB — CBC WITH DIFFERENTIAL (CANCER CENTER ONLY)
Abs Immature Granulocytes: 0.01 10*3/uL (ref 0.00–0.07)
BASOS ABS: 0 10*3/uL (ref 0.0–0.1)
Basophils Relative: 0 %
Eosinophils Absolute: 0.2 10*3/uL (ref 0.0–0.5)
Eosinophils Relative: 5 %
HEMATOCRIT: 36.2 % (ref 36.0–46.0)
Hemoglobin: 11.7 g/dL — ABNORMAL LOW (ref 12.0–15.0)
Immature Granulocytes: 0 %
Lymphocytes Relative: 47 %
Lymphs Abs: 2.4 10*3/uL (ref 0.7–4.0)
MCH: 27.6 pg (ref 26.0–34.0)
MCHC: 32.3 g/dL (ref 30.0–36.0)
MCV: 85.4 fL (ref 80.0–100.0)
Monocytes Absolute: 0.4 10*3/uL (ref 0.1–1.0)
Monocytes Relative: 8 %
Neutro Abs: 2.1 10*3/uL (ref 1.7–7.7)
Neutrophils Relative %: 40 %
Platelet Count: 199 10*3/uL (ref 150–400)
RBC: 4.24 MIL/uL (ref 3.87–5.11)
RDW: 14.9 % (ref 11.5–15.5)
WBC Count: 5.1 10*3/uL (ref 4.0–10.5)
nRBC: 0 % (ref 0.0–0.2)

## 2018-03-07 LAB — LACTATE DEHYDROGENASE: LDH: 181 U/L (ref 98–192)

## 2018-03-07 MED ORDER — IOPAMIDOL (ISOVUE-370) INJECTION 76%
100.0000 mL | Freq: Once | INTRAVENOUS | Status: AC | PRN
  Administered 2018-03-07: 100 mL via INTRAVENOUS

## 2018-03-07 NOTE — Progress Notes (Signed)
Hematology and Oncology Follow Up Visit  Hannah Thompson [id number] [birth year] 48 y.o. 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  She is doing pretty well.  She is on Pradaxa and doing okay with the Pradaxa.  We did go ahead and do a CT angiogram of her chest today.  There is no evidence of any residual pulmonary emboli.  Which she had previously are now resolved.  We also got an ultrasound of her right leg.  There is no DVT noted in either leg.  She is had no problems with bleeding from the Pradaxa.  She has had no issues with nausea or vomiting.  She has had no cough.  There is been no chest wall pain.  She has had no leg swelling.  Overall, her performance status is ECOG 1.    Medications:  Allergies as of 03/07/2018      Reactions   Oxaprozin    angioedema   Sulfonamide Derivatives    REACTION: hives   Orange Juice [orange Oil] Hives   Orange juice   Tomato Hives   tomatoes   Latex Rash   Nsaids Rash      Medication List       Accurate as of March 07, 2018  1:49 PM. Always use your most recent med list.        acetaminophen 325 MG tablet Commonly known as:  TYLENOL Take 2 tablets (650 mg total) by mouth every 6 (six) hours as needed for mild pain, moderate pain or headache (or Fever >/= 101).   dabigatran 150 MG Caps capsule Commonly known as:  PRADAXA Take 1 capsule (150 mg total) by mouth every 12 (twelve) hours.       Allergies:  Allergies  Allergen Reactions  . Oxaprozin     angioedema  . Sulfonamide Derivatives     REACTION: hives  . Orange Juice [Orange Oil] Hives    Orange juice  . Tomato Hives    tomatoes  . Latex Rash  . Nsaids Rash    Past Medical History, Surgical history, Social  history, and Family History were reviewed and updated.  Review of Systems: Review of Systems  Constitutional: Negative.   HENT: Negative.   Eyes: Negative.   Respiratory: Negative.   Cardiovascular: Negative.   Gastrointestinal: Negative.   Genitourinary: Negative.   Musculoskeletal: Negative.   Skin: Negative.   Neurological: Negative.   Endo/Heme/Allergies: Negative.   Psychiatric/Behavioral: Negative.      Physical Exam:  weight is 276 lb (125.2 kg). Her oral temperature is 98 F (36.7 C). Her blood pressure is 138/74 and her pulse is 70. Her respiration is 18 and oxygen saturation is 100%.   Wt Readings from Last 3 Encounters:  03/07/18 276 lb (125.2 kg)  01/07/18 275 lb 8 oz (125 kg)  12/12/17 267 lb (121.1 kg)    Physical Exam Vitals signs reviewed.  HENT:     Head: Normocephalic and atraumatic.  Eyes:     Pupils: Pupils are equal, round, and reactive to light.  Neck:     Musculoskeletal: Normal range of motion.  Cardiovascular:     Rate and Rhythm: Normal rate and regular rhythm.     Heart sounds: Normal heart sounds.  Pulmonary:     Effort: Pulmonary effort is normal.     Breath sounds: Normal breath sounds.  Abdominal:     General: Bowel sounds are normal.     Palpations: Abdomen is soft.  Musculoskeletal: Normal range of motion.        General: No tenderness or deformity.  Lymphadenopathy:     Cervical: No cervical adenopathy.  Skin:    General: Skin is warm and dry.     Findings: No erythema or rash.  Neurological:     Mental Status: She is alert and oriented to person, place, and time.  Psychiatric:        Behavior: Behavior normal.        Thought Content: Thought content normal.        Judgment: Judgment normal.      Lab Results  Component Value Date   WBC 5.1 03/07/2018   HGB 11.7 (L) 03/07/2018   HCT 36.2 03/07/2018   MCV 85.4 03/07/2018   PLT 199 03/07/2018   Lab Results  Component Value Date   FERRITIN 242 08/16/2015   IRON 76  08/16/2015   TIBC 251 08/16/2015   UIBC 175 08/16/2015   IRONPCTSAT 30 08/16/2015   Lab Results  Component Value Date   RBC 4.24 03/07/2018   No results found for: KPAFRELGTCHN, LAMBDASER, KAPLAMBRATIO No results found for: Loel Lofty, IGMSERUM No results found for: Marda Stalker, SPEI   Chemistry      Component Value Date/Time   NA 138 03/07/2018 0956   NA 139 10/23/2016 1306   NA 140 09/25/2016 1145   K 4.8 03/07/2018 0956   K 3.8 10/23/2016 1306   K 4.1 09/25/2016 1145   CL 105 03/07/2018 0956   CL 107 10/23/2016 1306   CO2 28 03/07/2018 0956   CO2 26 10/23/2016 1306   CO2 28 09/25/2016 1145   BUN 10 03/07/2018 0956   BUN 8 10/23/2016 1306   BUN 9.5 09/25/2016 1145   CREATININE 0.67 03/07/2018 0956   CREATININE 0.8 10/23/2016 1306   CREATININE 0.8 09/25/2016 1145      Component Value Date/Time   CALCIUM 9.8 03/07/2018 0956   CALCIUM 8.7 10/23/2016 1306   CALCIUM 9.6 09/25/2016 1145   ALKPHOS 60 03/07/2018 0956   ALKPHOS 67 10/23/2016 1306   ALKPHOS 72 09/25/2016 1145   AST 12 (L) 03/07/2018 0956   AST 17 09/25/2016 1145   ALT 10 03/07/2018 0956   ALT 26 10/23/2016 1306   ALT 11 09/25/2016 1145   BILITOT 0.3 03/07/2018 0956   BILITOT 0.26 09/25/2016 1145       Impression and Plan: Hannah Thompson is a very pleasant 48 yo African [place] female with significant history of thrombus and most recently pulmonary embolism with right heart strain as well as bilateral lower extremity DVT's while off of Pradaxa for 2 weeks.   She is now back on her Pradaxa PO BID and is doing well. She has no complaints at this time.   We will plan to see her back in 4 months for repeat scans and MD follow-up.      Josph Macho, MD 3/5/20201:49 PM

## 2018-03-08 ENCOUNTER — Ambulatory Visit: Payer: Medicaid Other | Admitting: Hematology & Oncology

## 2018-03-08 ENCOUNTER — Other Ambulatory Visit: Payer: Medicaid Other

## 2018-03-31 IMAGING — DX DG AC JOINTS*L*
4 series · 4 of 4 positions shown · non-contrast
Comparison: None.

CLINICAL DATA: Trip and fall 2 months ago with persistent right
shoulder pain, initial encounter

EXAM:
ACROMIOCLAVICULAR JOINTS

[dg ac joints ap (1 of 4)]
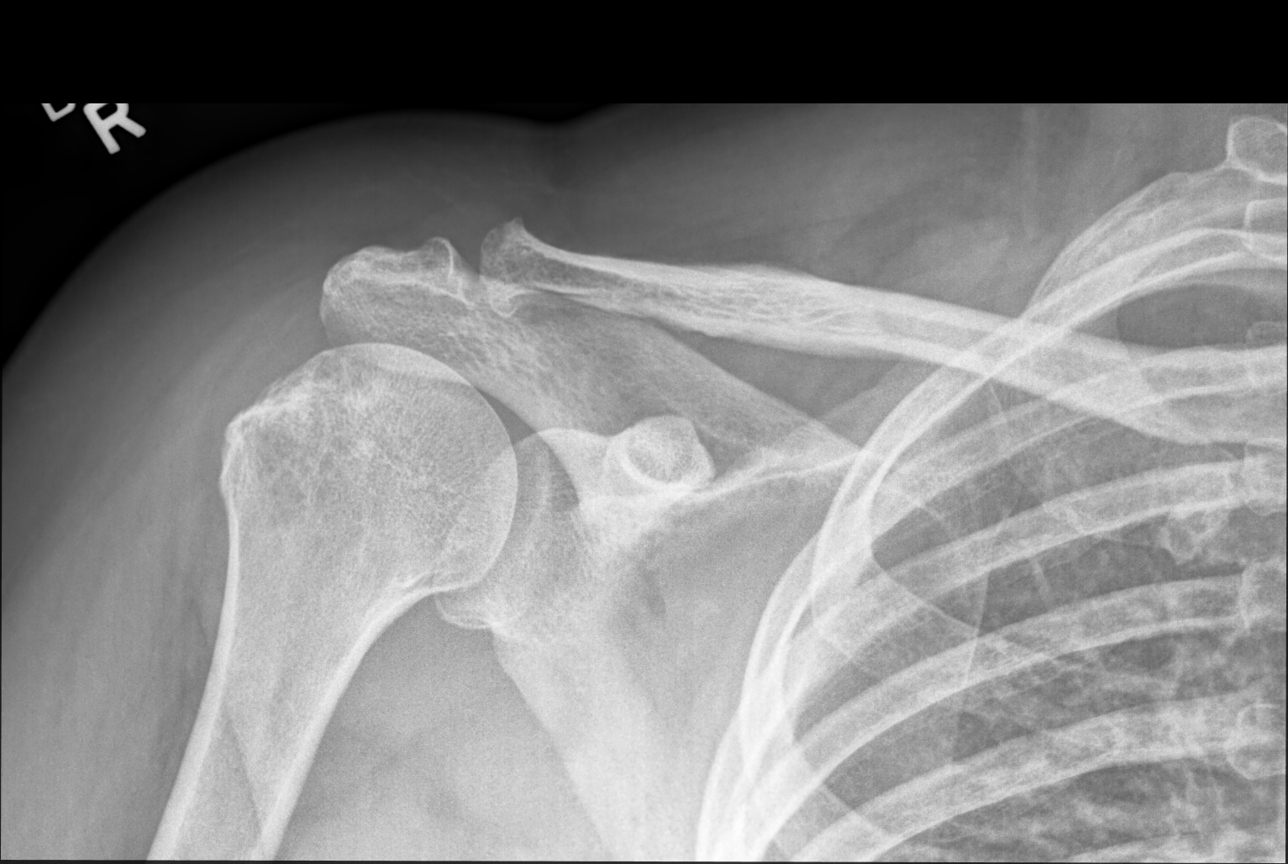

[dg ac joints ap (2 of 4)]
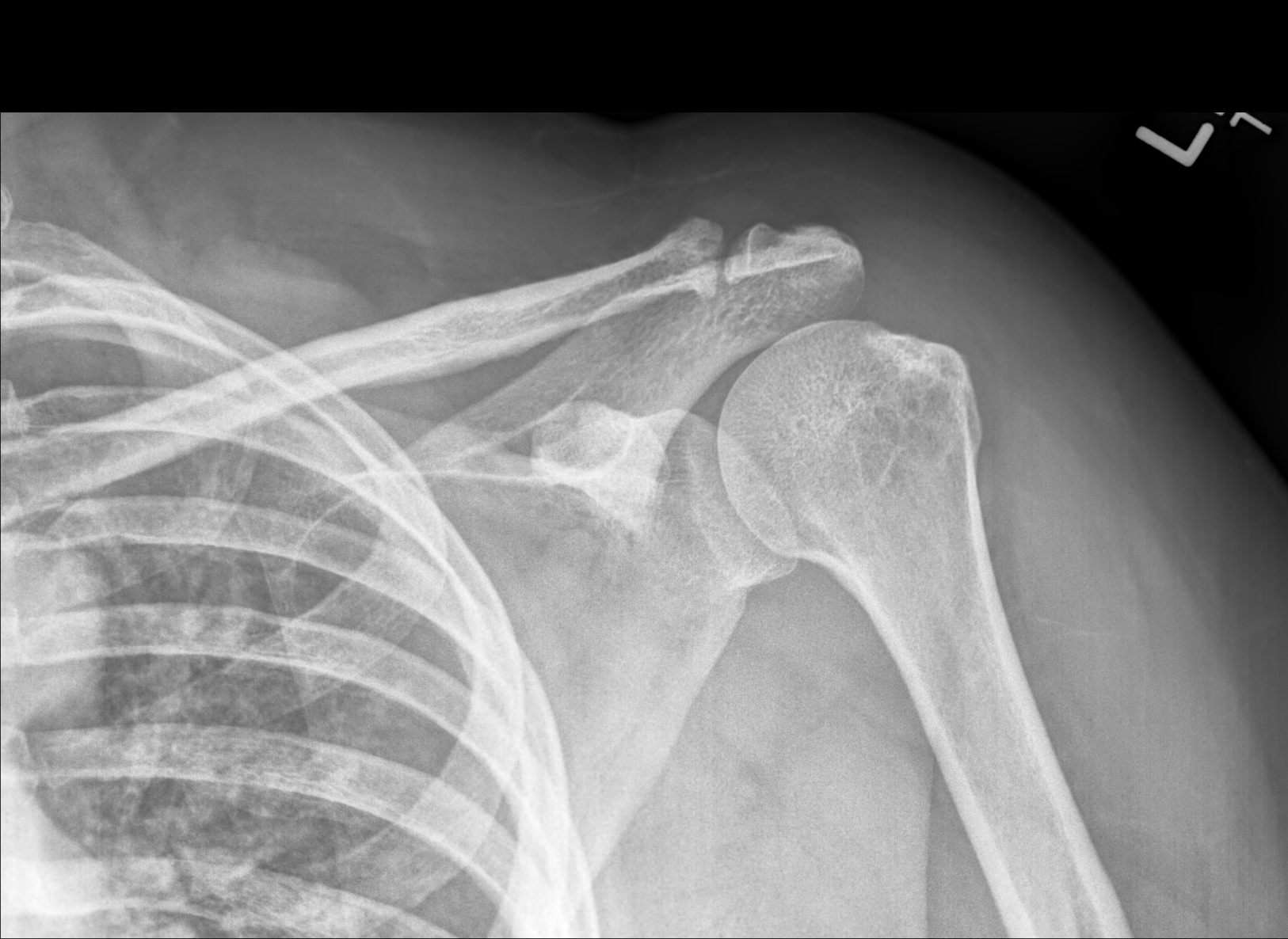

[dg ac joints ap (3 of 4)]
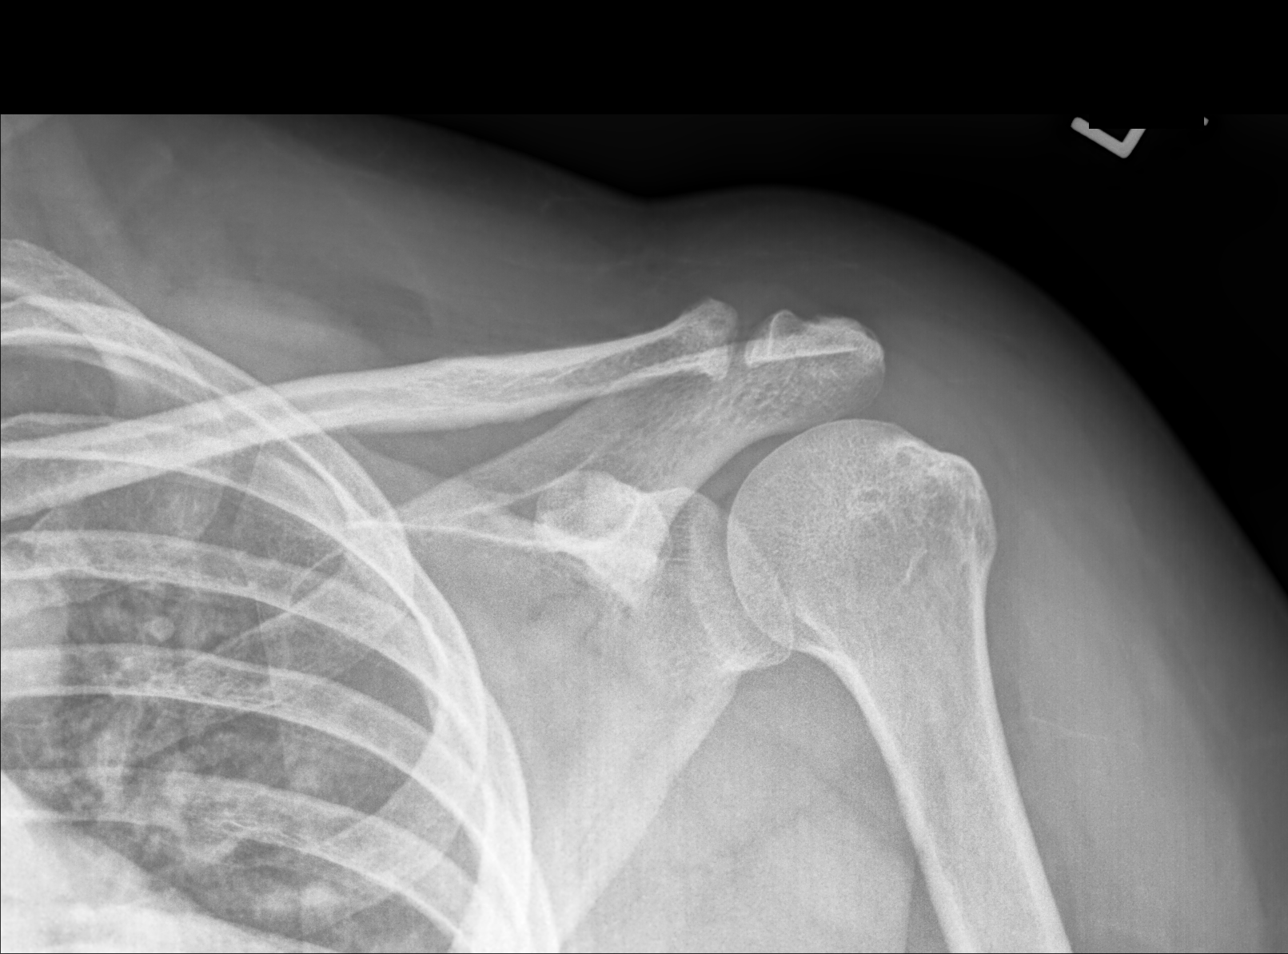

[dg ac joints ap (4 of 4)]
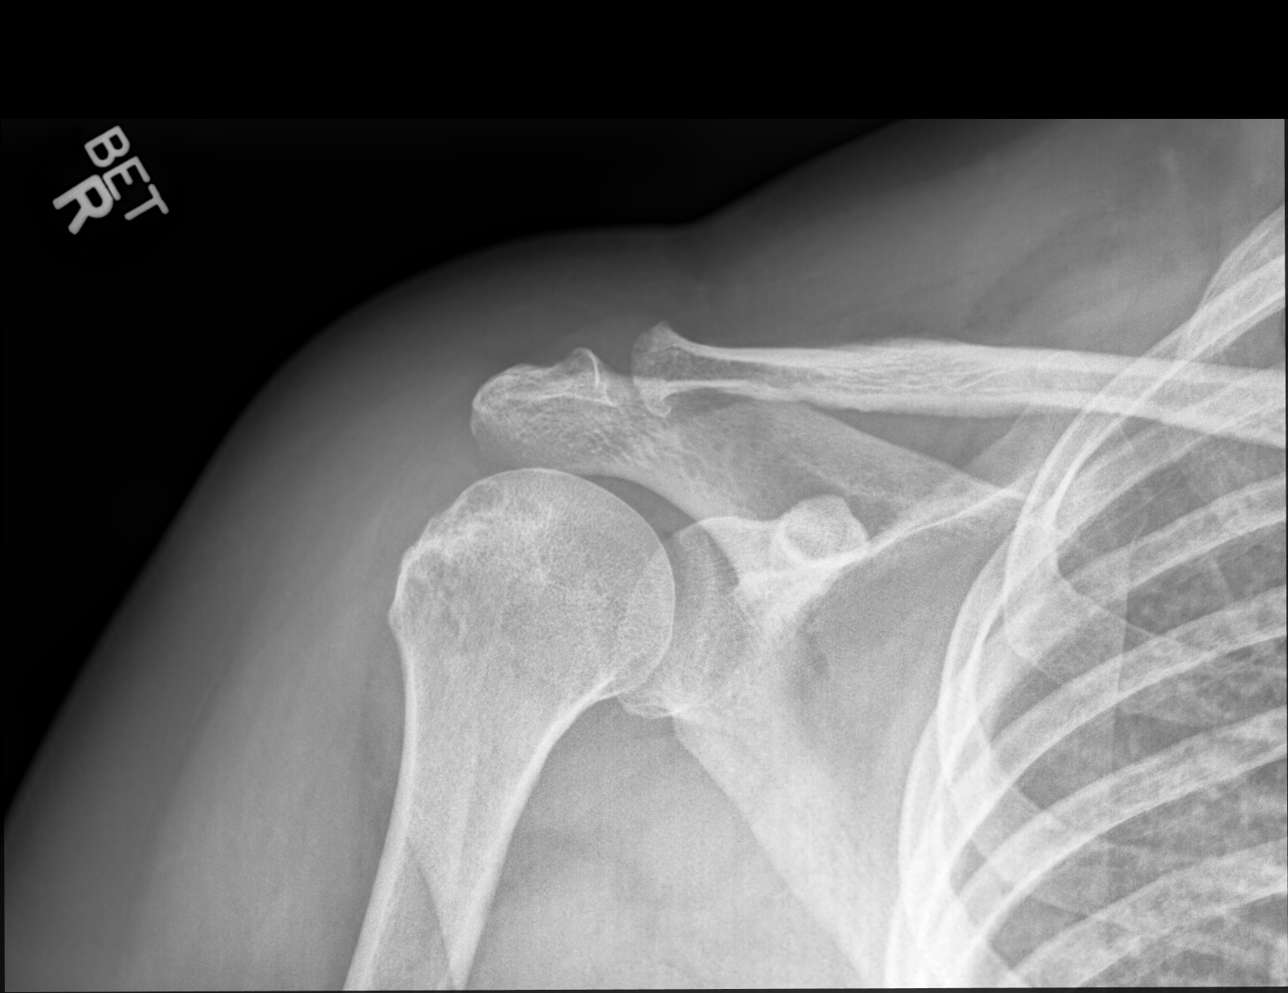

[4 of 4 positions shown; findings below may reference images not displayed]

FINDINGS: Degenerative changes of the acromioclavicular joints are noted
bilaterally. No acute fracture or dislocation is seen. No
significant joint separation is noted. Weighted images also show no
significant joint separation.
IMPRESSION: Degenerative changes of the acromioclavicular joints bilaterally. No
significant separation is noted.

## 2018-06-26 ENCOUNTER — Telehealth: Payer: Self-pay | Admitting: Internal Medicine

## 2018-06-26 NOTE — Telephone Encounter (Signed)
Pt stated she called earlier and spoke with someone in regards to Covid-19 because she went to the beach and she was told she has to quarantine herself for 14 days. Pt requests to speak with a nurse because she has some questions. Cb# [phone]

## 2018-06-28 NOTE — Telephone Encounter (Signed)
Pt wanted clarification if she needed to quarantine after returning from a beach vacation after speaking with her coworkers. I informed her to double check with her employer to see what the policy is for mandatory or involuntary quarantine after a vacation and she expressed understanding. I informed her if she is having symptoms that we could have her called about an appointment for a covid test. She stated that she is not having any symptoms but wanted a little insight. She stated that she would call back if she start to develop any for a test.

## 2018-07-08 ENCOUNTER — Inpatient Hospital Stay: Payer: BLUE CROSS/BLUE SHIELD | Attending: Hematology & Oncology | Admitting: Hematology & Oncology

## 2018-07-08 ENCOUNTER — Encounter: Payer: Self-pay | Admitting: Hematology & Oncology

## 2018-07-08 ENCOUNTER — Inpatient Hospital Stay: Payer: BLUE CROSS/BLUE SHIELD

## 2018-07-08 ENCOUNTER — Other Ambulatory Visit: Payer: Self-pay

## 2018-07-08 VITALS — BP 130/89 | HR 58 | Temp 98.7°F | Resp 19 | Wt 266.0 lb

## 2018-07-08 DIAGNOSIS — Z7901 Long term (current) use of anticoagulants: Secondary | ICD-10-CM

## 2018-07-08 DIAGNOSIS — D696 Thrombocytopenia, unspecified: Secondary | ICD-10-CM | POA: Diagnosis not present

## 2018-07-08 DIAGNOSIS — I2699 Other pulmonary embolism without acute cor pulmonale: Secondary | ICD-10-CM

## 2018-07-08 DIAGNOSIS — I2601 Septic pulmonary embolism with acute cor pulmonale: Secondary | ICD-10-CM

## 2018-07-08 DIAGNOSIS — Z86718 Personal history of other venous thrombosis and embolism: Secondary | ICD-10-CM | POA: Diagnosis not present

## 2018-07-08 DIAGNOSIS — D72819 Decreased white blood cell count, unspecified: Secondary | ICD-10-CM | POA: Diagnosis not present

## 2018-07-08 LAB — CMP (CANCER CENTER ONLY)
ALT: 9 U/L (ref 0–44)
AST: 13 U/L — ABNORMAL LOW (ref 15–41)
Albumin: 4.1 g/dL (ref 3.5–5.0)
Alkaline Phosphatase: 57 U/L (ref 38–126)
Anion gap: 6 (ref 5–15)
BUN: 11 mg/dL (ref 6–20)
CO2: 26 mmol/L (ref 22–32)
Calcium: 8.6 mg/dL — ABNORMAL LOW (ref 8.9–10.3)
Chloride: 105 mmol/L (ref 98–111)
Creatinine: 0.66 mg/dL (ref 0.44–1.00)
GFR, Est AFR Am: >60 mL/min (ref >60)
GFR, Estimated: >60 mL/min (ref >60)
Glucose, Bld: 114 mg/dL — ABNORMAL HIGH (ref 70–99)
Potassium: 4.5 mmol/L (ref 3.5–5.1)
Sodium: 137 mmol/L (ref 135–145)
Total Bilirubin: 0.3 mg/dL (ref 0.3–1.2)
Total Protein: 7 g/dL (ref 6.5–8.1)

## 2018-07-08 LAB — CBC WITH DIFFERENTIAL (CANCER CENTER ONLY)
Abs Immature Granulocytes: 0.01 10*3/uL (ref 0.00–0.07)
Basophils Absolute: 0 10*3/uL (ref 0.0–0.1)
Basophils Relative: 1 %
Eosinophils Absolute: 0.4 10*3/uL (ref 0.0–0.5)
Eosinophils Relative: 8 %
HCT: 36.2 % (ref 36.0–46.0)
Hemoglobin: 11.8 g/dL — ABNORMAL LOW (ref 12.0–15.0)
Immature Granulocytes: 0 %
Lymphocytes Relative: 55 %
Lymphs Abs: 2.8 10*3/uL (ref 0.7–4.0)
MCH: 27.7 pg (ref 26.0–34.0)
MCHC: 32.6 g/dL (ref 30.0–36.0)
MCV: 85 fL (ref 80.0–100.0)
Monocytes Absolute: 0.3 10*3/uL (ref 0.1–1.0)
Monocytes Relative: 6 %
Neutro Abs: 1.5 10*3/uL — ABNORMAL LOW (ref 1.7–7.7)
Neutrophils Relative %: 30 %
Platelet Count: 203 10*3/uL (ref 150–400)
RBC: 4.26 MIL/uL (ref 3.87–5.11)
RDW: 14.8 % (ref 11.5–15.5)
WBC Count: 5.1 10*3/uL (ref 4.0–10.5)
nRBC: 0 % (ref 0.0–0.2)

## 2018-07-08 NOTE — Progress Notes (Signed)
Hematology and Oncology Follow Up Visit  Hannah Thompson [id number] 12/17/1970 49 y.o. 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 is here today for follow-up.  She is doing pretty well.  She is on Pradaxa and doing okay with the Pradaxa.  She had a nice July 4 holiday.  She was with her family.  She has had no problems with cough or shortness of breath.  There has been no chest wall pain.  She has had no bleeding.  There has been no abdominal discomfort.  She has had no change in bowel or bladder habits.  Overall, her performance status is ECOG 1.    Medications:  Allergies as of 07/08/2018      Reactions   Oxaprozin    angioedema   Sulfonamide Derivatives    REACTION: hives   Orange Juice [orange Oil] Hives   Orange juice   Tomato Hives   tomatoes   Latex Rash   Nsaids Rash      Medication List       Accurate as of July 08, 2018  9:50 AM. If you have any questions, ask your nurse or doctor.        acetaminophen 325 MG tablet Commonly known as: TYLENOL Take 2 tablets (650 mg total) by mouth every 6 (six) hours as needed for mild pain, moderate pain or headache (or Fever >/= 101).   dabigatran 150 MG Caps capsule Commonly known as: PRADAXA Take 1 capsule (150 mg total) by mouth every 12 (twelve) hours.       Allergies:  Allergies  Allergen Reactions  . Oxaprozin     angioedema  . Sulfonamide Derivatives     REACTION: hives  . Orange Juice [Orange Oil] Hives    Orange juice  . Tomato Hives    tomatoes  . Latex Rash  . Nsaids Rash    Past Medical History, Surgical history, Social history, and Family History were reviewed and updated.  Review of Systems: Review of Systems  Constitutional: Negative.   HENT: Negative.   Eyes:  Negative.   Respiratory: Negative.   Cardiovascular: Negative.   Gastrointestinal: Negative.   Genitourinary: Negative.   Musculoskeletal: Negative.   Skin: Negative.   Neurological: Negative.   Endo/Heme/Allergies: Negative.   Psychiatric/Behavioral: Negative.      Physical Exam:  weight is 266 lb (120.7 kg). Her oral temperature is 98.7 F (37.1 C). Her blood pressure is 130/89 and her pulse is 58 (abnormal). Her respiration is 19 and oxygen saturation is 99%.   Wt Readings from Last 3 Encounters:  07/08/18 266 lb (120.7 kg)  03/07/18 276 lb (125.2 kg)  01/07/18 275 lb 8 oz (125 kg)    Physical Exam Vitals signs reviewed.  HENT:     Head: Normocephalic and atraumatic.  Eyes:     Pupils: Pupils are equal, round, and reactive to light.  Neck:     Musculoskeletal: Normal range of motion.  Cardiovascular:     Rate and Rhythm: Normal rate and regular rhythm.     Heart sounds: Normal heart sounds.  Pulmonary:     Effort: Pulmonary effort is normal.     Breath sounds: Normal breath sounds.  Abdominal:     General: Bowel sounds are normal.  Palpations: Abdomen is soft.  Musculoskeletal: Normal range of motion.        General: No tenderness or deformity.  Lymphadenopathy:     Cervical: No cervical adenopathy.  Skin:    General: Skin is warm and dry.     Findings: No erythema or rash.  Neurological:     Mental Status: She is alert and oriented to person, place, and time.  Psychiatric:        Behavior: Behavior normal.        Thought Content: Thought content normal.        Judgment: Judgment normal.      Lab Results  Component Value Date   WBC 5.1 07/08/2018   HGB 11.8 (L) 07/08/2018   HCT 36.2 07/08/2018   MCV 85.0 07/08/2018   PLT 203 07/08/2018   Lab Results  Component Value Date   FERRITIN 242 08/16/2015   IRON 76 08/16/2015   TIBC 251 08/16/2015   UIBC 175 08/16/2015   IRONPCTSAT 30 08/16/2015   Lab Results  Component Value Date   RBC 4.26  07/08/2018   No results found for: KPAFRELGTCHN, LAMBDASER, KAPLAMBRATIO No results found for: Loel LoftyGGSERUM, IGA, IGMSERUM No results found for: Marda StalkerOTALPROTELP, ALBUMINELP, A1GS, A2GS, BETS, BETA2SER, GAMS, MSPIKE, SPEI   Chemistry      Component Value Date/Time   NA 138 03/07/2018 0956   NA 139 10/23/2016 1306   NA 140 09/25/2016 1145   K 4.8 03/07/2018 0956   K 3.8 10/23/2016 1306   K 4.1 09/25/2016 1145   CL 105 03/07/2018 0956   CL 107 10/23/2016 1306   CO2 28 03/07/2018 0956   CO2 26 10/23/2016 1306   CO2 28 09/25/2016 1145   BUN 10 03/07/2018 0956   BUN 8 10/23/2016 1306   BUN 9.5 09/25/2016 1145   CREATININE 0.67 03/07/2018 0956   CREATININE 0.8 10/23/2016 1306   CREATININE 0.8 09/25/2016 1145      Component Value Date/Time   CALCIUM 9.8 03/07/2018 0956   CALCIUM 8.7 10/23/2016 1306   CALCIUM 9.6 09/25/2016 1145   ALKPHOS 60 03/07/2018 0956   ALKPHOS 67 10/23/2016 1306   ALKPHOS 72 09/25/2016 1145   AST 12 (L) 03/07/2018 0956   AST 17 09/25/2016 1145   ALT 10 03/07/2018 0956   ALT 26 10/23/2016 1306   ALT 11 09/25/2016 1145   BILITOT 0.3 03/07/2018 0956   BILITOT 0.26 09/25/2016 1145       Impression and Plan: [name] is a very pleasant 48 yo African [place] female with significant history of thrombus and most recently pulmonary embolism with right heart strain as well as bilateral lower extremity DVT's while off of Pradaxa for 2 weeks.   She is now back on her Pradaxa PO BID and is doing well. She has no complaints at this time.  We will see her back now in 6 months.    Josph MachoPeter R , MD 7/6/20209:50 AM

## 2018-10-01 IMAGING — US US EXTREM LOW VENOUS BILAT
1 series · 13 of 24 positions shown · non-contrast
Comparison: None.

CLINICAL DATA: History of prior DVT, elevated D-dimer, fall, right
shin bruising.



[Series 1: us extrem low venous bilat · 0.09mm/px · 46 acquisitions, 13 frames shown]
[im 1/46]
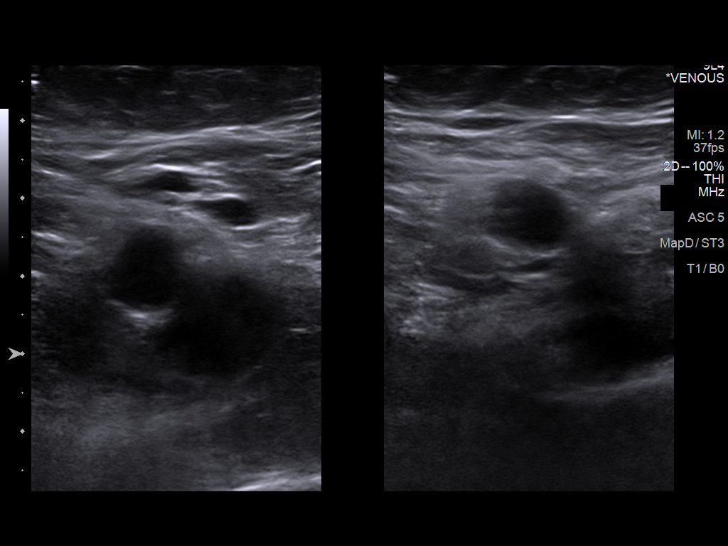
[im 4/46]
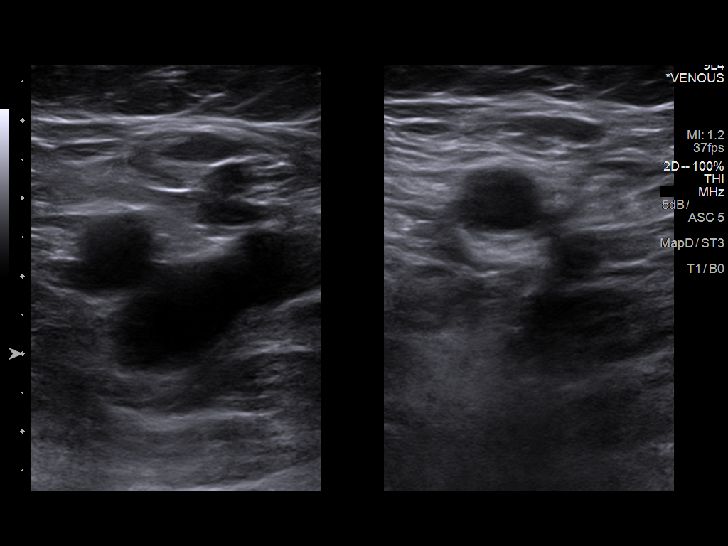
[im 8/46]
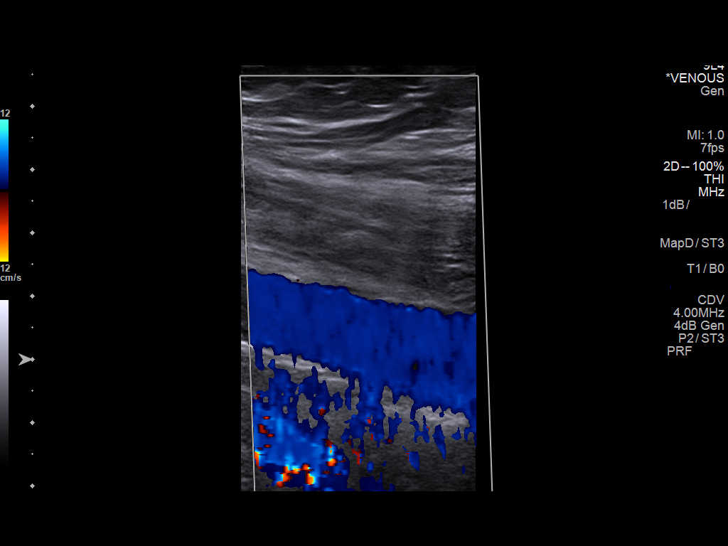
[im 12/46]
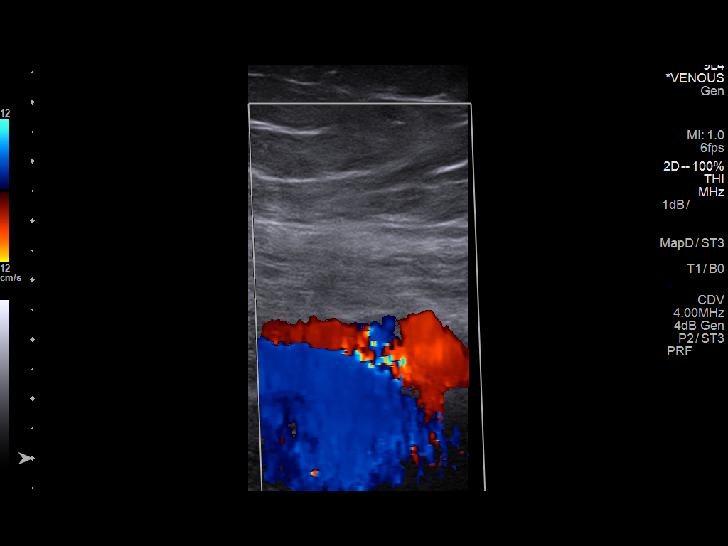
[im 16/46]
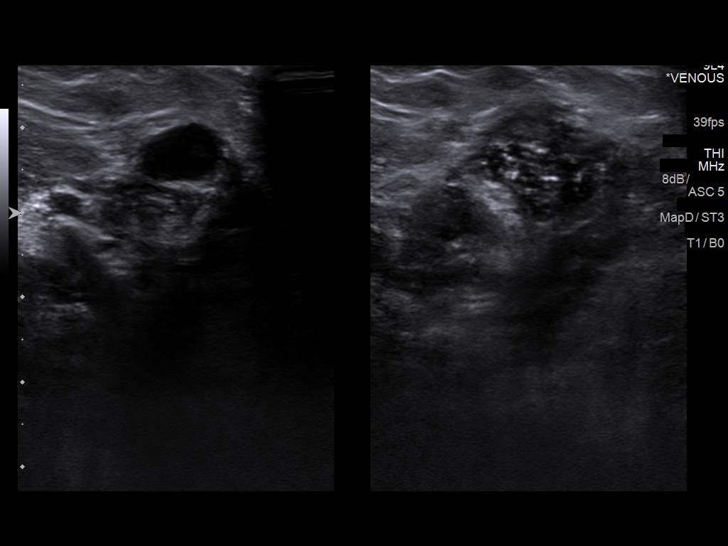
[im 20/46]
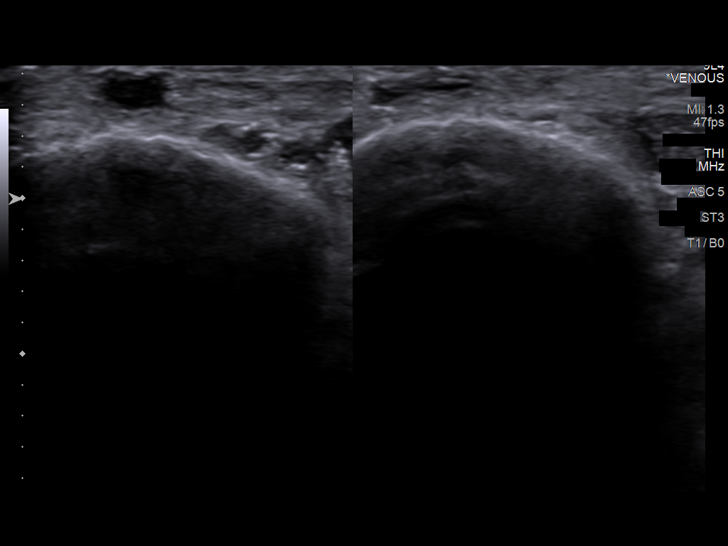
[im 26/46]
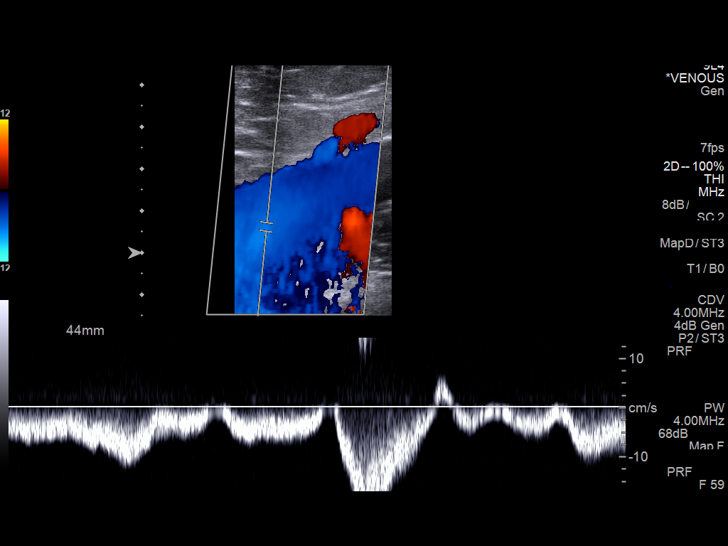
[im 28/46]
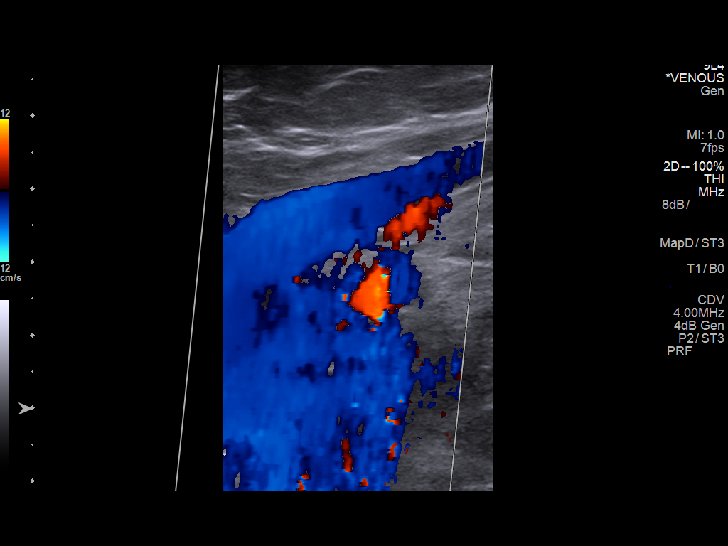
[im 32/46]
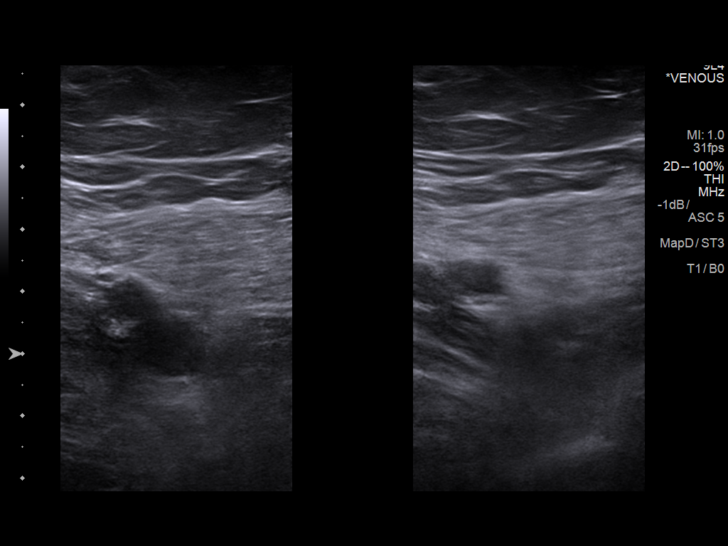
[im 36/46]
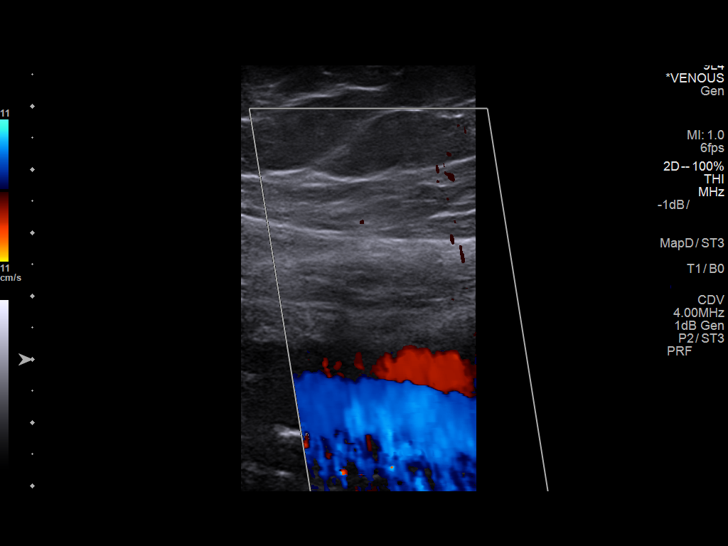
[im 40/46]
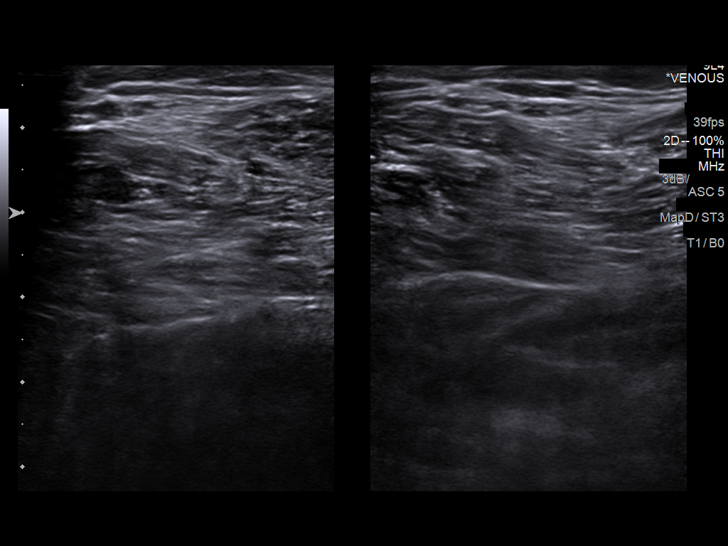
[im 42/46]
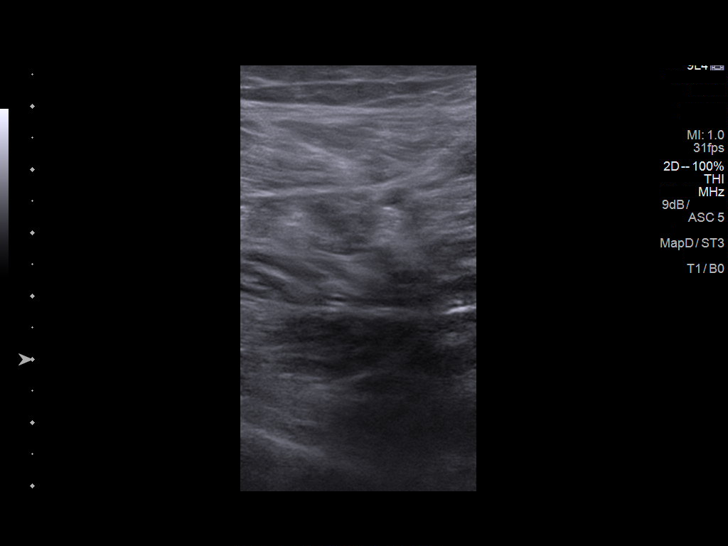
[im 46/46]
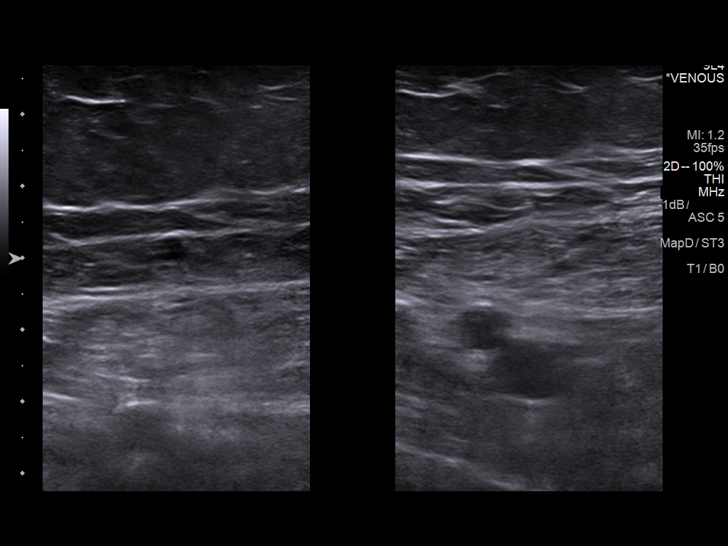

[13 of 24 positions shown; findings below may reference images not displayed]

FINDINGS: RIGHT LOWER EXTREMITY

Common Femoral Vein: No evidence of thrombus. Normal
compressibility, respiratory phasicity and response to augmentation.

Saphenofemoral Junction: No evidence of thrombus. Normal
compressibility and flow on color Doppler imaging.

Profunda Femoral Vein: No evidence of thrombus. Normal
compressibility and flow on color Doppler imaging.

Femoral Vein: No evidence of thrombus. Normal compressibility,
respiratory phasicity and response to augmentation.

Popliteal Vein: No evidence of thrombus. Normal compressibility,
respiratory phasicity and response to augmentation.

Calf Veins: No evidence of thrombus. Normal compressibility and flow
on color Doppler imaging.

Superficial Great Saphenous Vein: No evidence of thrombus. Normal
compressibility and flow on color Doppler imaging.

Venous Reflux:  None.

Other Findings: Right anterior distal tibia nonspecific subcutaneous
fluid collection measures 1.1 x 0.4 x 1.2 cm. This correlates with
the palpable abnormality and recent local trauma. Appearance remains
nonspecific but could represent sequelae from localized hematoma.

LEFT LOWER EXTREMITY

Common Femoral Vein: No evidence of thrombus. Normal
compressibility, respiratory phasicity and response to augmentation.

Saphenofemoral Junction: No evidence of thrombus. Normal
compressibility and flow on color Doppler imaging.

Profunda Femoral Vein: No evidence of thrombus. Normal
compressibility and flow on color Doppler imaging.

Femoral Vein: No evidence of thrombus. Normal compressibility,
respiratory phasicity and response to augmentation.

Popliteal Vein: No evidence of thrombus. Normal compressibility,
respiratory phasicity and response to augmentation.

Calf Veins: No evidence of thrombus. Normal compressibility and flow
on color Doppler imaging.

Superficial Great Saphenous Vein: No evidence of thrombus. Normal
compressibility and flow on color Doppler imaging.

Venous Reflux:  None.

Other Findings:  Small left knee joint effusion incidentally noted.
IMPRESSION: Negative for significant acute DVT in either extremity.

Other incidental findings as above.

## 2019-01-08 ENCOUNTER — Inpatient Hospital Stay: Payer: Self-pay

## 2019-01-08 ENCOUNTER — Other Ambulatory Visit: Payer: Self-pay

## 2019-01-08 ENCOUNTER — Telehealth: Payer: Self-pay | Admitting: Family

## 2019-01-08 ENCOUNTER — Inpatient Hospital Stay: Payer: Self-pay | Attending: Family | Admitting: Family

## 2019-01-08 ENCOUNTER — Encounter: Payer: Self-pay | Admitting: Family

## 2019-01-08 VITALS — BP 144/79 | HR 59 | Temp 97.3°F | Resp 18 | Wt 274.0 lb

## 2019-01-08 DIAGNOSIS — Z7901 Long term (current) use of anticoagulants: Secondary | ICD-10-CM | POA: Insufficient documentation

## 2019-01-08 DIAGNOSIS — Z882 Allergy status to sulfonamides status: Secondary | ICD-10-CM | POA: Insufficient documentation

## 2019-01-08 DIAGNOSIS — Z79899 Other long term (current) drug therapy: Secondary | ICD-10-CM | POA: Insufficient documentation

## 2019-01-08 DIAGNOSIS — D696 Thrombocytopenia, unspecified: Secondary | ICD-10-CM | POA: Insufficient documentation

## 2019-01-08 DIAGNOSIS — I2692 Saddle embolus of pulmonary artery without acute cor pulmonale: Secondary | ICD-10-CM

## 2019-01-08 DIAGNOSIS — I5189 Other ill-defined heart diseases: Secondary | ICD-10-CM | POA: Insufficient documentation

## 2019-01-08 DIAGNOSIS — D72819 Decreased white blood cell count, unspecified: Secondary | ICD-10-CM | POA: Insufficient documentation

## 2019-01-08 DIAGNOSIS — Z86711 Personal history of pulmonary embolism: Secondary | ICD-10-CM | POA: Insufficient documentation

## 2019-01-08 DIAGNOSIS — I82491 Acute embolism and thrombosis of other specified deep vein of right lower extremity: Secondary | ICD-10-CM

## 2019-01-08 DIAGNOSIS — I82403 Acute embolism and thrombosis of unspecified deep veins of lower extremity, bilateral: Secondary | ICD-10-CM | POA: Insufficient documentation

## 2019-01-08 DIAGNOSIS — I2699 Other pulmonary embolism without acute cor pulmonale: Secondary | ICD-10-CM

## 2019-01-08 DIAGNOSIS — Z886 Allergy status to analgesic agent status: Secondary | ICD-10-CM | POA: Insufficient documentation

## 2019-01-08 LAB — CBC WITH DIFFERENTIAL (CANCER CENTER ONLY)
Abs Immature Granulocytes: 0.01 10*3/uL (ref 0.00–0.07)
Basophils Absolute: 0 10*3/uL (ref 0.0–0.1)
Basophils Relative: 1 %
Eosinophils Absolute: 0.3 10*3/uL (ref 0.0–0.5)
Eosinophils Relative: 7 %
HCT: 34.4 % — ABNORMAL LOW (ref 36.0–46.0)
Hemoglobin: 11.3 g/dL — ABNORMAL LOW (ref 12.0–15.0)
Immature Granulocytes: 0 %
Lymphocytes Relative: 47 %
Lymphs Abs: 2.3 10*3/uL (ref 0.7–4.0)
MCH: 27.6 pg (ref 26.0–34.0)
MCHC: 32.8 g/dL (ref 30.0–36.0)
MCV: 83.9 fL (ref 80.0–100.0)
Monocytes Absolute: 0.3 10*3/uL (ref 0.1–1.0)
Monocytes Relative: 7 %
Neutro Abs: 1.8 10*3/uL (ref 1.7–7.7)
Neutrophils Relative %: 38 %
Platelet Count: 186 10*3/uL (ref 150–400)
RBC: 4.1 MIL/uL (ref 3.87–5.11)
RDW: 14.1 % (ref 11.5–15.5)
WBC Count: 4.9 10*3/uL (ref 4.0–10.5)
nRBC: 0 % (ref 0.0–0.2)

## 2019-01-08 LAB — CMP (CANCER CENTER ONLY)
ALT: 9 U/L (ref 0–44)
AST: 12 U/L — ABNORMAL LOW (ref 15–41)
Albumin: 3.9 g/dL (ref 3.5–5.0)
Alkaline Phosphatase: 55 U/L (ref 38–126)
Anion gap: 7 (ref 5–15)
BUN: 12 mg/dL (ref 6–20)
CO2: 25 mmol/L (ref 22–32)
Calcium: 9.1 mg/dL (ref 8.9–10.3)
Chloride: 105 mmol/L (ref 98–111)
Creatinine: 0.61 mg/dL (ref 0.44–1.00)
GFR, Est AFR Am: >60 mL/min (ref >60)
GFR, Estimated: >60 mL/min (ref >60)
Glucose, Bld: 103 mg/dL — ABNORMAL HIGH (ref 70–99)
Potassium: 4.1 mmol/L (ref 3.5–5.1)
Sodium: 137 mmol/L (ref 135–145)
Total Bilirubin: 0.4 mg/dL (ref 0.3–1.2)
Total Protein: 7 g/dL (ref 6.5–8.1)

## 2019-01-08 NOTE — Progress Notes (Signed)
Hematology and Oncology Follow Up Visit  Hannah Thompson [id number] 09-Apr-1970 49 y.o. 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 She is doing well and has no complaints at this time.  She verbalized that she is taking her Pradaxa BID as prescribed.  Her cycle is regular and can be heavy at times due to the Pradaxa. No other bleeding noted. No bruising or petechiae.  No fever, chills, n/v, cough, rash, dizziness, SOB, chest pain or palpitations, abdominal pain or changes in bowel or bladder habits.  No swelling, tenderness, numbness or tingling in her extremities.  No falls or syncope.  She has maintained a good appetite and is staying well hydrated. Her weight is stable.   ECOG Performance Status: 1 - Symptomatic but completely ambulatory  Medications:  Allergies as of 01/08/2019      Reactions   Oxaprozin    angioedema   Sulfonamide Derivatives    REACTION: hives   Orange Juice [orange Oil] Hives   Orange juice   Tomato Hives   tomatoes   Latex Rash   Nsaids Rash      Medication List       Accurate as of January 08, 2019  9:08 AM. If you have any questions, ask your nurse or doctor.        acetaminophen 325 MG tablet Commonly known as: TYLENOL Take 2 tablets (650 mg total) by mouth every 6 (six) hours as needed for mild pain, moderate pain or headache (or Fever >/= 101).   dabigatran 150 MG Caps capsule Commonly known as: PRADAXA Take 1 capsule (150 mg total) by mouth every 12 (twelve) hours.       Allergies:  Allergies  Allergen Reactions  . Oxaprozin     angioedema  . Sulfonamide Derivatives     REACTION: hives  . Orange Juice [Orange Oil] Hives    Orange juice  . Tomato Hives    tomatoes  . Latex  Rash  . Nsaids Rash    Past Medical History, Surgical history, Social history, and Family History were reviewed and updated.  Review of Systems: All other 10 point review of systems is negative.   Physical Exam:  vitals were not taken for this visit.   Wt Readings from Last 3 Encounters:  07/08/18 266 lb (120.7 kg)  03/07/18 276 lb (125.2 kg)  01/07/18 275 lb 8 oz (125 kg)    Ocular: Sclerae unicteric, pupils equal, round and reactive to light Ear-nose-throat: Oropharynx clear, dentition fair Lymphatic: No cervical or supraclavicular adenopathy Lungs no rales or rhonchi, good excursion bilaterally Heart regular rate and rhythm, no murmur appreciated Abd soft, nontender, positive bowel sounds, no liver or spleen tip palpated on exam, no fluid wave  MSK no focal spinal tenderness, no joint edema Neuro: non-focal, well-oriented, appropriate affect Breasts: Deferred   Lab Results  Component Value Date   WBC 5.1 07/08/2018   HGB 11.8 (L) 07/08/2018   HCT 36.2 07/08/2018   MCV 85.0 07/08/2018   PLT 203 07/08/2018   Lab Results  Component Value Date   FERRITIN 242 08/16/2015   IRON 76 08/16/2015   TIBC 251 08/16/2015   UIBC 175 08/16/2015   IRONPCTSAT 30 08/16/2015   Lab Results  Component Value Date  RBC 4.26 07/08/2018   No results found for: KPAFRELGTCHN, LAMBDASER, KAPLAMBRATIO No results found for: IGGSERUM, IGA, IGMSERUM No results found for: Hannah Thompson, SPEI   Chemistry      Component Value Date/Time   NA 137 07/08/2018 0915   NA 139 10/23/2016 1306   NA 140 09/25/2016 1145   K 4.5 07/08/2018 0915   K 3.8 10/23/2016 1306   K 4.1 09/25/2016 1145   CL 105 07/08/2018 0915   CL 107 10/23/2016 1306   CO2 26 07/08/2018 0915   CO2 26 10/23/2016 1306   CO2 28 09/25/2016 1145   BUN 11 07/08/2018 0915   BUN 8 10/23/2016 1306   BUN 9.5 09/25/2016 1145   CREATININE 0.66 07/08/2018 0915   CREATININE 0.8  10/23/2016 1306   CREATININE 0.8 09/25/2016 1145      Component Value Date/Time   CALCIUM 8.6 (L) 07/08/2018 0915   CALCIUM 8.7 10/23/2016 1306   CALCIUM 9.6 09/25/2016 1145   ALKPHOS 57 07/08/2018 0915   ALKPHOS 67 10/23/2016 1306   ALKPHOS 72 09/25/2016 1145   AST 13 (L) 07/08/2018 0915   AST 17 09/25/2016 1145   ALT 9 07/08/2018 0915   ALT 26 10/23/2016 1306   ALT 11 09/25/2016 1145   BILITOT 0.3 07/08/2018 0915   BILITOT 0.26 09/25/2016 1145       Impression and Plan: Hannah Thompson is a very pleasant 49 yo African [place] female with significant history of thrombus and most recently pulmonary embolism with right heart strain as well as bilateral lower extremity DVT's while off of Pradaxa for 2 weeks.  She continues to do well on long term Pradaxa and will continue her same regimen.  We will continue to follow along with her and see her back in another 6 months.  She will contact our office with any questions or concerns. We can certainly see her sooner if needed.   Emeline Gins, NP 1/6/20219:08 AM

## 2019-01-08 NOTE — Telephone Encounter (Signed)
Appointments scheduled calendar printed per 1/6 los 

## 2019-02-17 ENCOUNTER — Other Ambulatory Visit: Payer: Self-pay | Admitting: Family

## 2019-02-17 DIAGNOSIS — I82491 Acute embolism and thrombosis of other specified deep vein of right lower extremity: Secondary | ICD-10-CM

## 2019-02-17 DIAGNOSIS — I2692 Saddle embolus of pulmonary artery without acute cor pulmonale: Secondary | ICD-10-CM

## 2019-02-19 ENCOUNTER — Other Ambulatory Visit: Payer: Self-pay | Admitting: *Deleted

## 2019-02-19 DIAGNOSIS — I2692 Saddle embolus of pulmonary artery without acute cor pulmonale: Secondary | ICD-10-CM

## 2019-02-19 DIAGNOSIS — I82491 Acute embolism and thrombosis of other specified deep vein of right lower extremity: Secondary | ICD-10-CM

## 2019-02-25 ENCOUNTER — Encounter: Payer: Self-pay | Admitting: Family

## 2019-02-26 ENCOUNTER — Telehealth: Payer: Self-pay

## 2019-02-26 NOTE — Telephone Encounter (Signed)
Letter related to pt's requirement for Pradaxa after failing multiple other anti-coagulants placed in box of financial counselor, Delice Bison. dph

## 2019-03-06 ENCOUNTER — Telehealth: Payer: Self-pay | Admitting: *Deleted

## 2019-03-06 NOTE — Telephone Encounter (Signed)
Received a call from Patient inquiring about the status of her Pradaxa medication and prior authorization.  Phone call forwarded to Artist.

## 2019-03-26 ENCOUNTER — Other Ambulatory Visit: Payer: Medicaid Other

## 2019-03-26 ENCOUNTER — Ambulatory Visit: Payer: MEDICAID | Attending: Internal Medicine

## 2019-03-26 DIAGNOSIS — Z20822 Contact with and (suspected) exposure to covid-19: Secondary | ICD-10-CM

## 2019-03-27 LAB — NOVEL CORONAVIRUS, NAA: SARS-CoV-2, NAA: NOT DETECTED

## 2019-03-27 LAB — SARS-COV-2, NAA 2 DAY TAT

## 2019-04-13 ENCOUNTER — Other Ambulatory Visit: Payer: Self-pay | Admitting: Family

## 2019-04-13 DIAGNOSIS — I2692 Saddle embolus of pulmonary artery without acute cor pulmonale: Secondary | ICD-10-CM

## 2019-04-13 DIAGNOSIS — I82491 Acute embolism and thrombosis of other specified deep vein of right lower extremity: Secondary | ICD-10-CM

## 2019-05-26 ENCOUNTER — Other Ambulatory Visit: Payer: Self-pay | Admitting: Family

## 2019-05-26 DIAGNOSIS — I2692 Saddle embolus of pulmonary artery without acute cor pulmonale: Secondary | ICD-10-CM

## 2019-05-26 DIAGNOSIS — I82491 Acute embolism and thrombosis of other specified deep vein of right lower extremity: Secondary | ICD-10-CM

## 2019-06-11 ENCOUNTER — Ambulatory Visit (INDEPENDENT_AMBULATORY_CARE_PROVIDER_SITE_OTHER): Payer: 59 | Admitting: Family

## 2019-06-11 ENCOUNTER — Encounter: Payer: Self-pay | Admitting: Family

## 2019-06-11 ENCOUNTER — Other Ambulatory Visit: Payer: Self-pay

## 2019-06-11 VITALS — BP 126/74 | HR 52 | Temp 98.3°F | Wt 272.2 lb

## 2019-06-11 DIAGNOSIS — M545 Low back pain, unspecified: Secondary | ICD-10-CM

## 2019-06-11 DIAGNOSIS — M62838 Other muscle spasm: Secondary | ICD-10-CM

## 2019-06-11 MED ORDER — HYDROCODONE-ACETAMINOPHEN 5-325 MG PO TABS: 1.0000 | ORAL_TABLET | Freq: Four times a day (QID) | ORAL | 0 refills | Status: DC | PRN

## 2019-06-11 MED ORDER — CYCLOBENZAPRINE HCL 10 MG PO TABS: 10.0000 mg | ORAL_TABLET | Freq: Three times a day (TID) | ORAL | 0 refills | Status: DC | PRN

## 2019-06-11 MED ORDER — METHYLPREDNISOLONE ACETATE 40 MG/ML IJ SUSP
40.0000 mg | Freq: Once | INTRAMUSCULAR | Status: AC
  Administered 2019-06-11: 40 mg via INTRAMUSCULAR

## 2019-06-11 NOTE — Addendum Note (Signed)
Addended by: Deatra James on: 06/11/2019 01:26 PM   Modules accepted: Orders

## 2019-06-11 NOTE — Progress Notes (Signed)
Hannah Thompson is a 49 y.o. female with the following history as recorded in EpicCare:  Patient Active Problem List   Diagnosis Date Noted  . Pulmonary embolism ([place]) 12/10/2017  . Recurrent pulmonary embolism ([place]) 02/26/2017  . Hypertension 10/17/2016  . Hypokalemia 10/17/2016  . Toxic conjunctivitis, left 08/12/2016  . Encounter for well adult exam with abnormal findings 06/27/2016  . Acute pain of right shoulder 06/27/2016  . Foreign body in right foot 03/27/2016  . Grade 2 ankle sprain 07/27/2015  . Headache 04/27/2015  . Hyperpigmentation 03/01/2015  . Skin trauma 03/01/2015  . Medial meniscus tear 01/13/2015  . Effusion of left knee 01/13/2015  . Encounter for therapeutic drug monitoring 07/07/2014  . Pedal edema 04/28/2014  . Chronic low back pain 02/03/2014  . Burn of right foot 12/19/2013  . Cellulitis 11/21/2013  . Therapeutic drug monitoring 11/21/2013  . Chronic anticoagulation 10/21/2013  . Pulmonary embolus ([place]) 10/15/2013  . Late effect of superficial injury 09/11/2013  . Pruritus 09/11/2013  . Acute pulmonary embolism ([place]) 09/04/2013  . Acute deep vein thrombosis (DVT) of other specified vein of right lower extremity ([place]) 09/04/2013  . Thrombocytopenia ([place]) 09/04/2013  . Syncope and collapse 09/04/2013  . Grief 08/18/2013  . Right arm pain 05/30/2013  . Angioedema 03/14/2013  . Obesity 05/13/2011  . KELOID 06/21/2009  . SKIN LESION 06/21/2009  . Headache(784.0) 06/21/2009  . VITAMIN B12 DEFICIENCY 03/18/2009  . Hyperlipidemia 03/17/2009  . ANEMIA-NOS 03/17/2009  . Allergic rhinitis 03/17/2009  . Asthma 03/17/2009  . ECZEMA 03/17/2009  . [place] DISEASE, LUMBAR 03/17/2009  . FATIGUE 03/17/2009    Current Outpatient Medications  Medication Sig Dispense Refill  . acetaminophen (TYLENOL) 325 MG tablet Take 2 tablets (650 mg total) by mouth every 6 (six) hours as needed for mild pain, moderate pain or headache (or Fever >/= 101).    Marland Kitchen PRADAXA 150 MG  CAPS capsule TAKE 1 CAPSULE BY MOUTH EVERY 12 HOURS 60 capsule 0  . cyclobenzaprine (FLEXERIL) 10 MG tablet Take 1 tablet (10 mg total) by mouth 3 (three) times daily as needed for muscle spasms. 30 tablet 0  . HYDROcodone-acetaminophen (NORCO) 5-325 MG tablet Take 1 tablet by mouth every 6 (six) hours as needed for moderate pain. 15 tablet 0   No current facility-administered medications for this visit.    Allergies: Oxaprozin, Sulfonamide derivatives, Avocado, Orange juice [orange oil], Tomato, Latex, and Nsaids  Past Medical History:  Diagnosis Date  . ALLERGIC RHINITIS 03/17/2009  . Anemia   . ASTHMA 03/17/2009  . DVT (deep venous thrombosis) ([place])   . ECZEMA 03/17/2009  . Headache(784.0) 06/21/2009   recurrent  . HYPERLIPIDEMIA 03/17/2009  . KELOID 06/21/2009  . Pulmonary emboli ([place])   . VITAMIN B12 DEFICIENCY 03/18/2009    Past Surgical History:  Procedure Laterality Date  . KELOID EXCISION  mid 1990's   s/p keloid removal-laser     Family History  Problem Relation Age of Onset  . Cancer Father        Prostate Cancer  . Hypertension Father   . Allergies Sister   . Cancer Paternal Aunt        Ovarian Cancer  . Cancer Other        Grandparent-Lung Cancer  . Diabetes Other        Grandparent  . Heart disease Other        Grandparent  . Alcohol abuse Other        Several on both sides  of family-3 uncles  . Hypertension Mother   . Diabetes Mother   . Deep vein thrombosis Sister     Social History   Tobacco Use  . Smoking status: Former Smoker    Packs/day: 0.25    Years: 4.00    Pack years: 1.00    Types: Cigarettes    Start date: 03/17/2004    Quit date: 04/17/2008    Years since quitting: 11.1  . Smokeless tobacco: Never Used  . Tobacco comment: quit 5 years ago  Substance Use Topics  . Alcohol use: Yes    Alcohol/week: 0.0 standard drinks    Comment: socially    Subjective:  Low back pain x 24 hours; bent down to pick up something off the floor and  immediately felt her back lock up; no numbness or tingling; no change in bowel or bladder habits; used Oxycodone with some relief;     Objective:  Vitals:   06/11/19 1230  BP: 126/74  Pulse: (!) 52  Temp: 98.3 F (36.8 C)  TempSrc: Oral  SpO2: 97%  Weight: 272 lb 3.2 oz (123.5 kg)    General: Well developed, well nourished, in no acute distress  Skin : Warm and dry.  Head: Normocephalic and atraumatic  Lungs: Respirations unlabored; clear to auscultation bilaterally without wheeze, rales, rhonchi  Musculoskeletal: No deformities; no active joint inflammation  Extremities: No edema, cyanosis, clubbing  Vessels: Symmetric bilaterally  Neurologic: Alert and oriented; speech intact; face symmetrical; moves all extremities well; CNII-XII intact without focal deficit   Assessment:  1. Acute low back pain without sciatica, unspecified back pain laterality   2. Muscle spasm     Plan:  Depo-Medrol IM 40 mg given in office today; Rx for Flexeril and Norco; apply heat and rest; follow-up worse, no better.  She is encouraged to follow-up with her PCP for CPE as she has not seen him since 2019.  No follow-ups on file.  No orders of the defined types were placed in this encounter.   Requested Prescriptions   Signed Prescriptions Disp Refills  . cyclobenzaprine (FLEXERIL) 10 MG tablet 30 tablet 0    Sig: Take 1 tablet (10 mg total) by mouth 3 (three) times daily as needed for muscle spasms.  Marland Kitchen HYDROcodone-acetaminophen (NORCO) 5-325 MG tablet 15 tablet 0    Sig: Take 1 tablet by mouth every 6 (six) hours as needed for moderate pain.

## 2019-06-12 ENCOUNTER — Telehealth: Payer: Self-pay | Admitting: Internal Medicine

## 2019-06-12 DIAGNOSIS — M545 Low back pain, unspecified: Secondary | ICD-10-CM

## 2019-06-12 NOTE — Telephone Encounter (Signed)
Pt saw Vernona Rieger 06/11/19 will forward msg to her since she rx medication.Marland KitchenRaechel Chute

## 2019-06-12 NOTE — Telephone Encounter (Signed)
    Patient calling to report cyclobenzaprine (FLEXERIL) 10 MG tablet and HYDROcodone-acetaminophen (NORCO) 5-325 MG tablet are not helping with pain and mobility. Patient states meds are putting her to sleep but the pain is still there.  Please advise

## 2019-06-13 ENCOUNTER — Other Ambulatory Visit: Payer: Self-pay

## 2019-06-13 ENCOUNTER — Ambulatory Visit (INDEPENDENT_AMBULATORY_CARE_PROVIDER_SITE_OTHER)
Admission: RE | Admit: 2019-06-13 | Discharge: 2019-06-13 | Disposition: A | Discharge status: Home / Self Care | Payer: 59 | Source: Ambulatory Visit | Attending: Family | Admitting: Family

## 2019-06-13 DIAGNOSIS — M545 Low back pain, unspecified: Secondary | ICD-10-CM

## 2019-06-13 NOTE — Telephone Encounter (Signed)
I will put in order for her to go get a back X-ray at [facility]; they are open from 8-5; we will know better what we need to do after that.

## 2019-06-13 NOTE — Telephone Encounter (Signed)
   Patient made aware order in system for xray Patient agreeable to go to [facility] today

## 2019-06-20 ENCOUNTER — Encounter: Payer: Self-pay | Admitting: Internal Medicine

## 2019-06-20 ENCOUNTER — Ambulatory Visit (INDEPENDENT_AMBULATORY_CARE_PROVIDER_SITE_OTHER): Payer: 59 | Admitting: Internal Medicine

## 2019-06-20 ENCOUNTER — Other Ambulatory Visit: Payer: Self-pay | Admitting: Internal Medicine

## 2019-06-20 ENCOUNTER — Other Ambulatory Visit: Payer: Self-pay

## 2019-06-20 VITALS — BP 118/80 | HR 59 | Temp 98.0°F | Ht 67.0 in | Wt 272.0 lb

## 2019-06-20 DIAGNOSIS — R739 Hyperglycemia, unspecified: Secondary | ICD-10-CM

## 2019-06-20 DIAGNOSIS — M545 Low back pain, unspecified: Secondary | ICD-10-CM

## 2019-06-20 DIAGNOSIS — E538 Deficiency of other specified B group vitamins: Secondary | ICD-10-CM | POA: Diagnosis not present

## 2019-06-20 DIAGNOSIS — G8929 Other chronic pain: Secondary | ICD-10-CM

## 2019-06-20 DIAGNOSIS — I10 Essential (primary) hypertension: Secondary | ICD-10-CM

## 2019-06-20 DIAGNOSIS — E785 Hyperlipidemia, unspecified: Secondary | ICD-10-CM

## 2019-06-20 DIAGNOSIS — E559 Vitamin D deficiency, unspecified: Secondary | ICD-10-CM | POA: Diagnosis not present

## 2019-06-20 DIAGNOSIS — Z0000 Encounter for general adult medical examination without abnormal findings: Secondary | ICD-10-CM | POA: Diagnosis not present

## 2019-06-20 DIAGNOSIS — Z0001 Encounter for general adult medical examination with abnormal findings: Secondary | ICD-10-CM

## 2019-06-20 DIAGNOSIS — J4520 Mild intermittent asthma, uncomplicated: Secondary | ICD-10-CM | POA: Diagnosis not present

## 2019-06-20 DIAGNOSIS — E119 Type 2 diabetes mellitus without complications: Secondary | ICD-10-CM | POA: Insufficient documentation

## 2019-06-20 LAB — BASIC METABOLIC PANEL
BUN: 10 mg/dL (ref 6–23)
CO2: 29 mEq/L (ref 19–32)
Calcium: 9.2 mg/dL (ref 8.4–10.5)
Chloride: 107 mEq/L (ref 96–112)
Creatinine, Ser: 0.67 mg/dL (ref 0.40–1.20)
GFR: 113.46 mL/min (ref >60.00)
Glucose, Bld: 94 mg/dL (ref 70–99)
Potassium: 3.8 mEq/L (ref 3.5–5.1)
Sodium: 137 mEq/L (ref 135–145)

## 2019-06-20 LAB — VITAMIN D 25 HYDROXY (VIT D DEFICIENCY, FRACTURES): VITD: 8.08 ng/mL — ABNORMAL LOW (ref 30.00–100.00)

## 2019-06-20 LAB — CBC WITH DIFFERENTIAL/PLATELET
Basophils Absolute: 0 10*3/uL (ref 0.0–0.1)
Basophils Relative: 0.5 % (ref 0.0–3.0)
Eosinophils Absolute: 0.3 10*3/uL (ref 0.0–0.7)
Eosinophils Relative: 6.8 % — ABNORMAL HIGH (ref 0.0–5.0)
HCT: 34 % — ABNORMAL LOW (ref 36.0–46.0)
Hemoglobin: 11.1 g/dL — ABNORMAL LOW (ref 12.0–15.0)
Lymphocytes Relative: 50.7 % — ABNORMAL HIGH (ref 12.0–46.0)
Lymphs Abs: 2.3 10*3/uL (ref 0.7–4.0)
MCHC: 32.7 g/dL (ref 30.0–36.0)
MCV: 85.7 fl (ref 78.0–100.0)
Monocytes Absolute: 0.3 10*3/uL (ref 0.1–1.0)
Monocytes Relative: 7 % (ref 3.0–12.0)
Neutro Abs: 1.6 10*3/uL (ref 1.4–7.7)
Neutrophils Relative %: 35 % — ABNORMAL LOW (ref 43.0–77.0)
Platelets: 190 10*3/uL (ref 150.0–400.0)
RBC: 3.96 Mil/uL (ref 3.87–5.11)
RDW: 16.1 % — ABNORMAL HIGH (ref 11.5–15.5)
WBC: 4.4 10*3/uL (ref 4.0–10.5)

## 2019-06-20 LAB — HEPATIC FUNCTION PANEL
ALT: 11 U/L (ref 0–35)
AST: 15 U/L (ref 0–37)
Albumin: 4 g/dL (ref 3.5–5.2)
Alkaline Phosphatase: 57 U/L (ref 39–117)
Bilirubin, Direct: 0.1 mg/dL (ref 0.0–0.3)
Total Bilirubin: 0.2 mg/dL (ref 0.2–1.2)
Total Protein: 7.1 g/dL (ref 6.0–8.3)

## 2019-06-20 LAB — URINALYSIS, ROUTINE W REFLEX MICROSCOPIC
Bilirubin Urine: NEGATIVE
Ketones, ur: NEGATIVE
Leukocytes,Ua: NEGATIVE
Nitrite: NEGATIVE
Specific Gravity, Urine: 1.025 (ref 1.000–1.030)
Total Protein, Urine: NEGATIVE
Urine Glucose: NEGATIVE
Urobilinogen, UA: 0.2 (ref 0.0–1.0)
pH: 6 (ref 5.0–8.0)

## 2019-06-20 LAB — LIPID PANEL
Cholesterol: 155 mg/dL (ref 0–200)
HDL: 37.9 mg/dL — ABNORMAL LOW (ref >39.00)
LDL Cholesterol: 85 mg/dL (ref 0–99)
NonHDL: 116.74
Total CHOL/HDL Ratio: 4
Triglycerides: 159 mg/dL — ABNORMAL HIGH (ref 0.0–149.0)
VLDL: 31.8 mg/dL (ref 0.0–40.0)

## 2019-06-20 LAB — VITAMIN B12: Vitamin B-12: 237 pg/mL (ref 211–911)

## 2019-06-20 LAB — TSH: TSH: 2.18 u[IU]/mL (ref 0.35–4.50)

## 2019-06-20 LAB — HEMOGLOBIN A1C: Hgb A1c MFr Bld: 6.3 % (ref 4.6–6.5)

## 2019-06-20 MED ORDER — VITAMIN D (ERGOCALCIFEROL) 1.25 MG (50000 UNIT) PO CAPS: 50000.0000 [IU] | ORAL_CAPSULE | ORAL | 0 refills | Status: DC

## 2019-06-20 MED ORDER — TRAMADOL HCL 50 MG PO TABS: 50.0000 mg | ORAL_TABLET | Freq: Four times a day (QID) | ORAL | 0 refills | Status: DC | PRN

## 2019-06-20 NOTE — Patient Instructions (Addendum)
Please take all new medication as prescribed - the tramadol as needed for pain  Please call in 1 week for further pain medicine if needed, as we are only allowed to do 1 week to start by law  Please continue all other medications as before, and refills have been done if requested.  Please have the pharmacy call with any other refills you may need.  Please continue your efforts at being more active, low cholesterol diet, and weight control.  You are otherwise up to date with prevention measures today.  Please keep your appointments with your specialists as you may have planned - your GYN for pap smear and mammogram  Please go to the LAB at the blood drawing area for the tests to be done  You will be contacted by phone if any changes need to be made immediately.  Otherwise, you will receive a letter about your results with an explanation, but please check with MyChart first.  Please remember to sign up for MyChart if you have not done so, as this will be important to you in the future with finding out test results, communicating by private email, and scheduling acute appointments online when needed.  Please make an Appointment to return for your 1 year visit, or sooner if needed

## 2019-06-20 NOTE — Progress Notes (Signed)
Subjective:    Patient ID: Hannah Thompson, female    DOB: 05/10/1970, 49 y.o.   MRN: [id number]  HPI  Here for wellness and f/u;  Overall doing ok;  Pt denies Chest pain, worsening SOB, DOE, wheezing, orthopnea, PND, worsening LE edema, palpitations, dizziness or syncope.  Pt denies neurological change such as new headache, facial or extremity weakness.  Pt denies polydipsia, polyuria, or low sugar symptoms. Pt states overall good compliance with treatment and medications, good tolerability, and has been trying to follow appropriate diet.  Pt denies worsening depressive symptoms, suicidal ideation or panic. No fever, night sweats, wt loss, loss of appetite, or other constitutional symptoms.  Pt states good ability with ADL's, has low fall risk, home safety reviewed and adequate, no other significant changes in hearing or vision, and only occasionally active with exercise.  Also,, Pt continues to have acute onset 3 wks recurring left LBP without change in severity, bowel or bladder change, fever, wt loss,  worsening LE pain/numbness/weakness, gait change or falls. Past Medical History:  Diagnosis Date  . ALLERGIC RHINITIS 03/17/2009  . Anemia   . ASTHMA 03/17/2009  . DVT (deep venous thrombosis) ([place])   . ECZEMA 03/17/2009  . Headache(784.0) 06/21/2009   recurrent  . HYPERLIPIDEMIA 03/17/2009  . KELOID 06/21/2009  . Pulmonary emboli ([place])   . VITAMIN B12 DEFICIENCY 03/18/2009   Past Surgical History:  Procedure Laterality Date  . KELOID EXCISION  mid 1990's   s/p keloid removal-laser     reports that she quit smoking about 11 years ago. Her smoking use included cigarettes. She started smoking about 15 years ago. She has a 1.00 pack-year smoking history. She has never used smokeless tobacco. She reports current alcohol use. She reports that she does not use drugs. family history includes Alcohol abuse in an other family member; Allergies in her sister; Cancer in her father, paternal aunt, and  another family member; Deep vein thrombosis in her sister; Diabetes in her mother and another family member; Heart disease in an other family member; Hypertension in her father and mother. Allergies  Allergen Reactions  . Oxaprozin     angioedema  . Sulfonamide Derivatives     REACTION: hives  . Avocado   . Orange Juice [Orange Oil] Hives    Orange juice  . Tomato Hives    tomatoes  . Latex Rash  . Nsaids Rash   Current Outpatient Medications on File Prior to Visit  Medication Sig Dispense Refill  . acetaminophen (TYLENOL) 325 MG tablet Take 2 tablets (650 mg total) by mouth every 6 (six) hours as needed for mild pain, moderate pain or headache (or Fever >/= 101).    . cyclobenzaprine (FLEXERIL) 10 MG tablet Take 1 tablet (10 mg total) by mouth 3 (three) times daily as needed for muscle spasms. 30 tablet 0  . HYDROcodone-acetaminophen (NORCO) 5-325 MG tablet Take 1 tablet by mouth every 6 (six) hours as needed for moderate pain. 15 tablet 0  . PRADAXA 150 MG CAPS capsule TAKE 1 CAPSULE BY MOUTH EVERY 12 HOURS 60 capsule 0   No current facility-administered medications on file prior to visit.   Review of Systems All otherwise neg per pt     Objective:   Physical Exam BP 118/80 (BP Location: Left Arm, Patient Position: Sitting, Cuff Size: Large)   Pulse (!) 59   Temp 98 F (36.7 C) (Oral)   Ht 5\' 7"  (1.702 m)   Wt 272 lb (123.4  kg)   SpO2 98%   BMI 42.60 kg/m  VS noted,  Constitutional: Pt appears in NAD HENT: Head: NCAT.  Right Ear: External ear normal.  Left Ear: External ear normal.  Eyes: . Pupils are equal, round, and reactive to light. Conjunctivae and EOM are normal Nose: without d/c or deformity Neck: Neck supple. Gross normal ROM Cardiovascular: Normal rate and regular rhythm.   Pulmonary/Chest: Effort normal and breath sounds without rales or wheezing.  Abd:  Soft, NT, ND, + BS, no organomegaly Spine nontender with mod left lumbar paravertebral tender spasm  noted Neurological: Pt is alert. At baseline orientation, motor grossly intact Skin: Skin is warm. No rashes, other new lesions, no LE edema Psychiatric: Pt behavior is normal without agitation  All otherwise neg per pt Lab Results  Component Value Date   WBC 4.4 06/20/2019   HGB 11.1 (L) 06/20/2019   HCT 34.0 (L) 06/20/2019   PLT 190.0 06/20/2019   GLUCOSE 94 06/20/2019   CHOL 155 06/20/2019   TRIG 159.0 (H) 06/20/2019   HDL 37.90 (L) 06/20/2019   LDLCALC 85 06/20/2019   ALT 11 06/20/2019   AST 15 06/20/2019   NA 137 06/20/2019   K 3.8 06/20/2019   CL 107 06/20/2019   CREATININE 0.67 06/20/2019   BUN 10 06/20/2019   CO2 29 06/20/2019   TSH 2.18 06/20/2019   INR 1.01 12/09/2017   HGBA1C 6.3 06/20/2019      Assessment & Plan:

## 2019-06-21 ENCOUNTER — Encounter: Payer: Self-pay | Admitting: Internal Medicine

## 2019-06-21 NOTE — Assessment & Plan Note (Signed)
stable overall by history and exam, recent data reviewed with pt, and pt to continue medical treatment as before,  to f/u any worsening symptoms or concerns  

## 2019-06-21 NOTE — Assessment & Plan Note (Signed)

## 2019-06-21 NOTE — Assessment & Plan Note (Addendum)
With acute on chronic left lumbar x 3 wks - ? msk strain, for tramadol, declines MRI for now  I spent 31 minutes in addition to time for CPX wellness examination in preparing to see the patient by review of recent labs, imaging and procedures, obtaining and reviewing separately obtained history, communicating with the patient and family or caregiver, ordering medications, tests or procedures, and documenting clinical information in the EHR including the differential Dx, treatment, and any further evaluation and other management of low back pain, hld, asthma, htn, hyperglycemia

## 2019-07-08 ENCOUNTER — Inpatient Hospital Stay (HOSPITAL_BASED_OUTPATIENT_CLINIC_OR_DEPARTMENT_OTHER): Payer: 59 | Admitting: Family

## 2019-07-08 ENCOUNTER — Inpatient Hospital Stay: Payer: 59 | Attending: Hematology & Oncology

## 2019-07-08 ENCOUNTER — Other Ambulatory Visit: Payer: Self-pay

## 2019-07-08 ENCOUNTER — Encounter: Payer: Self-pay | Admitting: Family

## 2019-07-08 VITALS — BP 114/85 | HR 60 | Temp 98.4°F | Resp 16 | Wt 270.1 lb

## 2019-07-08 DIAGNOSIS — Z882 Allergy status to sulfonamides status: Secondary | ICD-10-CM | POA: Insufficient documentation

## 2019-07-08 DIAGNOSIS — I82491 Acute embolism and thrombosis of other specified deep vein of right lower extremity: Secondary | ICD-10-CM | POA: Diagnosis not present

## 2019-07-08 DIAGNOSIS — I82403 Acute embolism and thrombosis of unspecified deep veins of lower extremity, bilateral: Secondary | ICD-10-CM | POA: Insufficient documentation

## 2019-07-08 DIAGNOSIS — Z886 Allergy status to analgesic agent status: Secondary | ICD-10-CM | POA: Insufficient documentation

## 2019-07-08 DIAGNOSIS — Z7901 Long term (current) use of anticoagulants: Secondary | ICD-10-CM | POA: Insufficient documentation

## 2019-07-08 DIAGNOSIS — I2692 Saddle embolus of pulmonary artery without acute cor pulmonale: Secondary | ICD-10-CM

## 2019-07-08 DIAGNOSIS — D72819 Decreased white blood cell count, unspecified: Secondary | ICD-10-CM | POA: Diagnosis not present

## 2019-07-08 DIAGNOSIS — R072 Precordial pain: Secondary | ICD-10-CM | POA: Diagnosis not present

## 2019-07-08 DIAGNOSIS — Z79899 Other long term (current) drug therapy: Secondary | ICD-10-CM | POA: Insufficient documentation

## 2019-07-08 DIAGNOSIS — D696 Thrombocytopenia, unspecified: Secondary | ICD-10-CM | POA: Diagnosis not present

## 2019-07-08 DIAGNOSIS — K219 Gastro-esophageal reflux disease without esophagitis: Secondary | ICD-10-CM | POA: Diagnosis not present

## 2019-07-08 DIAGNOSIS — M549 Dorsalgia, unspecified: Secondary | ICD-10-CM | POA: Diagnosis not present

## 2019-07-08 LAB — CBC WITH DIFFERENTIAL (CANCER CENTER ONLY)
Abs Immature Granulocytes: 0.01 10*3/uL (ref 0.00–0.07)
Basophils Absolute: 0 10*3/uL (ref 0.0–0.1)
Basophils Relative: 0 %
Eosinophils Absolute: 0.3 10*3/uL (ref 0.0–0.5)
Eosinophils Relative: 8 %
HCT: 36 % (ref 36.0–46.0)
Hemoglobin: 11.6 g/dL — ABNORMAL LOW (ref 12.0–15.0)
Immature Granulocytes: 0 %
Lymphocytes Relative: 55 %
Lymphs Abs: 2.5 10*3/uL (ref 0.7–4.0)
MCH: 27.6 pg (ref 26.0–34.0)
MCHC: 32.2 g/dL (ref 30.0–36.0)
MCV: 85.5 fL (ref 80.0–100.0)
Monocytes Absolute: 0.3 10*3/uL (ref 0.1–1.0)
Monocytes Relative: 6 %
Neutro Abs: 1.4 10*3/uL — ABNORMAL LOW (ref 1.7–7.7)
Neutrophils Relative %: 31 %
Platelet Count: 188 10*3/uL (ref 150–400)
RBC: 4.21 MIL/uL (ref 3.87–5.11)
RDW: 14.6 % (ref 11.5–15.5)
WBC Count: 4.6 10*3/uL (ref 4.0–10.5)
nRBC: 0 % (ref 0.0–0.2)

## 2019-07-08 LAB — CMP (CANCER CENTER ONLY)
ALT: 9 U/L (ref 0–44)
AST: 12 U/L — ABNORMAL LOW (ref 15–41)
Albumin: 4.1 g/dL (ref 3.5–5.0)
Alkaline Phosphatase: 58 U/L (ref 38–126)
Anion gap: 6 (ref 5–15)
BUN: 12 mg/dL (ref 6–20)
CO2: 26 mmol/L (ref 22–32)
Calcium: 9.2 mg/dL (ref 8.9–10.3)
Chloride: 107 mmol/L (ref 98–111)
Creatinine: 0.7 mg/dL (ref 0.44–1.00)
GFR, Est AFR Am: >60 mL/min (ref >60)
GFR, Estimated: >60 mL/min (ref >60)
Glucose, Bld: 111 mg/dL — ABNORMAL HIGH (ref 70–99)
Potassium: 4.1 mmol/L (ref 3.5–5.1)
Sodium: 139 mmol/L (ref 135–145)
Total Bilirubin: 0.3 mg/dL (ref 0.3–1.2)
Total Protein: 7.3 g/dL (ref 6.5–8.1)

## 2019-07-08 MED ORDER — DABIGATRAN ETEXILATE MESYLATE 150 MG PO CAPS: ORAL_CAPSULE | ORAL | 5 refills | Status: DC

## 2019-07-08 NOTE — Progress Notes (Signed)
Hematology and Oncology Follow Up Visit  Hannah Thompson [id number] 02-Oct-1970 49 y.o. 07/08/2019   Principle Diagnosis:  Recurrent PE with right heart stain - Idiopathic Bilateral lower extremity DVT Ethnic associated leukopenia Transient thrombocytopenia  Current Therapy:        Pradaxa 110 mg po BID - long term maintanence   Interim History:  Hannah Thompson is here today for follow-up. She is doing well but has been having issues with back pain. She has seen her PCP about this and plans to request an MRI for better evaluation.  She is doing well on Pradaxa and verbalized that she is taking as prescribed.  She has had no issue with bleeding. No abnormal bruising or petechiae.  She states that her cycle is regular and flow has not been too heavy.  No fever, chills, n/v, cough, rash, dizziness, SOB, palpitations, abdominal pain or changes in bowel or bladder habits.  She had one episode of mid sternal chest pain she feels was related to GERD last week. No other issues since that time.  No swelling, tenderness, numbness or tingling in her extremities.  No falls or syncope.  She has maintained a good appetite and is staying well hydrated. Her weight is stable.   ECOG Performance Status: 0 - Asymptomatic  Medications:  Allergies as of 07/08/2019      Reactions   Oxaprozin    angioedema   Sulfonamide Derivatives    REACTION: hives   Avocado    Orange Juice [orange Oil] Hives   Orange juice   Tomato Hives   tomatoes   Latex Rash   Nsaids Rash      Medication List       Accurate as of July 08, 2019  9:43 AM. If you have any questions, ask your nurse or doctor.        acetaminophen 325 MG tablet Commonly known as: TYLENOL Take 2 tablets (650 mg total) by mouth every 6 (six) hours as needed for mild pain, moderate pain or headache (or Fever >/= 101).   cyclobenzaprine 10 MG tablet Commonly known as: FLEXERIL Take 1 tablet (10 mg total) by mouth 3 (three) times daily as  needed for muscle spasms.   HYDROcodone-acetaminophen 5-325 MG tablet Commonly known as: Norco Take 1 tablet by mouth every 6 (six) hours as needed for moderate pain.   Pradaxa 150 MG Caps capsule Generic drug: dabigatran TAKE 1 CAPSULE BY MOUTH EVERY 12 HOURS   traMADol 50 MG tablet Commonly known as: ULTRAM Take 1 tablet (50 mg total) by mouth every 6 (six) hours as needed.   Vitamin D (Ergocalciferol) 1.25 MG (50000 UNIT) Caps capsule Commonly known as: DRISDOL Take 1 capsule (50,000 Units total) by mouth every 7 (seven) days.       Allergies:  Allergies  Allergen Reactions  . Oxaprozin     angioedema  . Sulfonamide Derivatives     REACTION: hives  . Avocado   . Orange Juice [Orange Oil] Hives    Orange juice  . Tomato Hives    tomatoes  . Latex Rash  . Nsaids Rash    Past Medical History, Surgical history, Social history, and Family History were reviewed and updated.  Review of Systems: All other 10 point review of systems is negative.   Physical Exam:  weight is 270 lb 1.6 oz (122.5 kg). Her oral temperature is 98.4 F (36.9 C). Her blood pressure is 114/85 and her pulse is 60. Her respiration is 16  and oxygen saturation is 100%.   Wt Readings from Last 3 Encounters:  07/08/19 270 lb 1.6 oz (122.5 kg)  06/20/19 272 lb (123.4 kg)  06/11/19 272 lb 3.2 oz (123.5 kg)    Ocular: Sclerae unicteric, pupils equal, round and reactive to light Ear-nose-throat: Oropharynx clear, dentition fair Lymphatic: No cervical or supraclavicular adenopathy Lungs no rales or rhonchi, good excursion bilaterally Heart regular rate and rhythm, no murmur appreciated Abd soft, nontender, positive bowel sounds, no liver or spleen tip palpated on exam, no fluid wave  MSK no focal spinal tenderness, no joint edema Neuro: non-focal, well-oriented, appropriate affect Breasts: Deferred  Lab Results  Component Value Date   WBC 4.6 07/08/2019   HGB 11.6 (L) 07/08/2019   HCT 36.0  07/08/2019   MCV 85.5 07/08/2019   PLT 188 07/08/2019   Lab Results  Component Value Date   FERRITIN 242 08/16/2015   IRON 76 08/16/2015   TIBC 251 08/16/2015   UIBC 175 08/16/2015   IRONPCTSAT 30 08/16/2015   Lab Results  Component Value Date   RBC 4.21 07/08/2019   No results found for: KPAFRELGTCHN, LAMBDASER, KAPLAMBRATIO No results found for: IGGSERUM, IGA, IGMSERUM No results found for: Marda Stalker, SPEI   Chemistry      Component Value Date/Time   NA 139 07/08/2019 0902   NA 139 10/23/2016 1306   NA 140 09/25/2016 1145   K 4.1 07/08/2019 0902   K 3.8 10/23/2016 1306   K 4.1 09/25/2016 1145   CL 107 07/08/2019 0902   CL 107 10/23/2016 1306   CO2 26 07/08/2019 0902   CO2 26 10/23/2016 1306   CO2 28 09/25/2016 1145   BUN 12 07/08/2019 0902   BUN 8 10/23/2016 1306   BUN 9.5 09/25/2016 1145   CREATININE 0.70 07/08/2019 0902   CREATININE 0.8 10/23/2016 1306   CREATININE 0.8 09/25/2016 1145      Component Value Date/Time   CALCIUM 9.2 07/08/2019 0902   CALCIUM 8.7 10/23/2016 1306   CALCIUM 9.6 09/25/2016 1145   ALKPHOS 58 07/08/2019 0902   ALKPHOS 67 10/23/2016 1306   ALKPHOS 72 09/25/2016 1145   AST 12 (L) 07/08/2019 0902   AST 17 09/25/2016 1145   ALT 9 07/08/2019 0902   ALT 26 10/23/2016 1306   ALT 11 09/25/2016 1145   BILITOT 0.3 07/08/2019 0902   BILITOT 0.26 09/25/2016 1145       Impression and Plan: Hannah Thompson is a very pleasant 49 yo African [place] female with significant history of thrombus and pulmonary embolism with right heart strain as well as bilateral lower extremity DVT's (while off of Pradaxa for 2 weeks).  She is doing well and taking her Pradaxa as prescribed. No new recurrences.  She will continue her same regimen and we will plan to see her again in another 6 months.  She will contact our office with any questions or concerns. We can certainly see her sooner if needed.   Emeline Gins, NP 7/6/20219:43 AM

## 2019-07-28 ENCOUNTER — Encounter: Payer: 59 | Admitting: Internal Medicine

## 2019-11-03 ENCOUNTER — Other Ambulatory Visit: Payer: Self-pay

## 2019-11-03 ENCOUNTER — Ambulatory Visit (INDEPENDENT_AMBULATORY_CARE_PROVIDER_SITE_OTHER): Payer: 59 | Admitting: Family

## 2019-11-03 ENCOUNTER — Encounter: Payer: Self-pay | Admitting: Family

## 2019-11-03 VITALS — BP 130/86 | HR 77 | Temp 98.1°F | Ht 67.0 in | Wt 272.2 lb

## 2019-11-03 DIAGNOSIS — R21 Rash and other nonspecific skin eruption: Secondary | ICD-10-CM

## 2019-11-03 MED ORDER — PREDNISONE 20 MG PO TABS: 40.0000 mg | ORAL_TABLET | Freq: Every day | ORAL | 0 refills | Status: DC

## 2019-11-03 MED ORDER — METHYLPREDNISOLONE ACETATE 80 MG/ML IJ SUSP
80.0000 mg | Freq: Once | INTRAMUSCULAR | Status: AC
  Administered 2019-11-03: 80 mg via INTRAMUSCULAR

## 2019-11-03 NOTE — Progress Notes (Signed)
Hannah Thompson is a 49 y.o. female with the following history as recorded in Epic[employer]:  Patient Active Problem List   Diagnosis Date Noted   Hyperglycemia 06/20/2019   Pulmonary embolism (HCC) 12/10/2017   Recurrent pulmonary embolism (HCC) 02/26/2017   Hypertension 10/17/2016   Hypokalemia 10/17/2016   Toxic conjunctivitis, left 08/12/2016   Encounter for well adult exam with abnormal findings 06/27/2016   Acute pain of right shoulder 06/27/2016   Foreign body in right foot 03/27/2016   Grade 2 ankle sprain 07/27/2015   Headache 04/27/2015   Hyperpigmentation 03/01/2015   Skin trauma 03/01/2015   Medial meniscus tear 01/13/2015   Effusion of left knee 01/13/2015   Encounter for therapeutic drug monitoring 07/07/2014   Pedal edema 04/28/2014   Chronic low back pain 02/03/2014   Burn of right foot 12/19/2013   Cellulitis 11/21/2013   Therapeutic drug monitoring 11/21/2013   Chronic anticoagulation 10/21/2013   Pulmonary embolus (HCC) 10/15/2013   Late effect of superficial injury 09/11/2013   Pruritus 09/11/2013   Acute pulmonary embolism (HCC) 09/04/2013   Acute deep vein thrombosis (DVT) of other specified vein of right lower extremity (HCC) 09/04/2013   Thrombocytopenia (HCC) 09/04/2013   Syncope and collapse 09/04/2013   Grief 08/18/2013   Right arm pain 05/30/2013   Angioedema 03/14/2013   Obesity 05/13/2011   KELOID 06/21/2009   SKIN LESION 06/21/2009   Headache(784.0) 06/21/2009   VITAMIN B12 DEFICIENCY 03/18/2009   Hyperlipidemia 03/17/2009   ANEMIA-NOS 03/17/2009   Allergic rhinitis 03/17/2009   Asthma 03/17/2009   ECZEMA 03/17/2009   DISC DISEASE, LUMBAR 03/17/2009   FATIGUE 03/17/2009    Current Outpatient Medications  Medication Sig Dispense Refill   acetaminophen (TYLENOL) 325 MG tablet Take 2 tablets (650 mg total) by mouth every 6 (six) hours as needed for mild pain, moderate pain or headache (or Fever >/=  101).     cyclobenzaprine (FLEXERIL) 10 MG tablet Take 1 tablet (10 mg total) by mouth 3 (three) times daily as needed for muscle spasms. 30 tablet 0   dabigatran (PRADAXA) 150 MG CAPS capsule TAKE 1 CAPSULE BY MOUTH EVERY 12 HOURS 60 capsule 5   HYDROcodone-acetaminophen (NORCO) 5-325 MG tablet Take 1 tablet by mouth every 6 (six) hours as needed for moderate pain. 15 tablet 0   traMADol (ULTRAM) 50 MG tablet Take 1 tablet (50 mg total) by mouth every 6 (six) hours as needed. 30 tablet 0   Vitamin D, Ergocalciferol, (DRISDOL) 1.25 MG (50000 UNIT) CAPS capsule Take 1 capsule (50,000 Units total) by mouth every 7 (seven) days. 12 capsule 0   predniSONE (DELTASONE) 20 MG tablet Take 2 tablets (40 mg total) by mouth daily with breakfast. 10 tablet 0   No current facility-administered medications for this visit.    Allergies: Oxaprozin, Sulfonamide derivatives, Avocado, Orange juice [orange oil], Tomato, Zyrtec [cetirizine], Latex, and Nsaids  Past Medical History:  Diagnosis Date   ALLERGIC RHINITIS 03/17/2009   Anemia    ASTHMA 03/17/2009   DVT (deep venous thrombosis) (HCC)    ECZEMA 03/17/2009   Headache(784.0) 06/21/2009   recurrent   HYPERLIPIDEMIA 03/17/2009   KELOID 06/21/2009   Pulmonary emboli (HCC)    VITAMIN B12 DEFICIENCY 03/18/2009    Past Surgical History:  Procedure Laterality Date   KELOID EXCISION  mid 1990's   s/p keloid removal-laser     Family History  Problem Relation Age of Onset   Cancer Father        Prostate  Cancer   Hypertension Father    Allergies Sister    Cancer Paternal Aunt        Ovarian Cancer   Cancer Other        Grandparent-Lung Cancer   Diabetes Other        Grandparent   Heart disease Other        Grandparent   Alcohol abuse Other        Several on both sides of family-3 uncles   Hypertension Mother    Diabetes Mother    Deep vein thrombosis Sister     Social History   Tobacco Use   Smoking status: Former  Smoker    Packs/day: 0.25    Years: 4.00    Pack years: 1.00    Types: Cigarettes    Start date: 03/17/2004    Quit date: 04/17/2008    Years since quitting: 11.5   Smokeless tobacco: Never Used   Tobacco comment: quit 5 years ago  Substance Use Topics   Alcohol use: Yes    Alcohol/week: 0.0 standard drinks    Comment: socially    Subjective:  Patient is concerned she is having an allergic reaction to Zyrtec; notes she started with allergies approximately 1 week ago after moving furniture in her apartment; started taking Zyrtec and has gradually noticed a worsening rash over the past few days; woke up this am and her eyes were swollen/ itching on her face/ lips swollen; she denies any difficulty breathing while in the office;   Objective:  Vitals:   11/03/19 1028  BP: 130/86  Pulse: 77  Temp: 98.1 F (36.7 C)  TempSrc: Oral  SpO2: 93%  Weight: 272 lb 3.2 oz (123.5 kg)  Height: 5\' 7"  (1.702 m)    General: Well developed, well nourished, in no acute distress; able to speak in full sentences with no difficulty Skin : Warm and dry. Rash noted over face/ eyes are swollen/ puffy Head: Normocephalic and atraumatic  Eyes: Sclera and conjunctiva clear; pupils round and reactive to light; extraocular movements intact  Ears: External normal; canals clear; tympanic membranes normal  Oropharynx: Pink, supple. No suspicious lesions  Neck: Supple without thyromegaly, adenopathy  Lungs: Respirations unlabored;  Neurologic: Alert and oriented; speech intact; face symmetrical; moves all extremities well; CNII-XII intact without focal deficit   Assessment:  1. Rash     Plan:  ? Allergic reaction to Zyrtec; no emergent symptoms noted in office- speaking and breathing normally in office today; Depo-Medrol IM 80 mg given in office today; start Prednisone 40 mg qd x 5 days; recommend that she see her PCP in 4 days for re-check;   Return in about 4 days (around 11/07/2019) for with Dr. 13/05/2019  rash re-check.  No orders of the defined types were placed in this encounter.   Requested Prescriptions   Signed Prescriptions Disp Refills   predniSONE (DELTASONE) 20 MG tablet 10 tablet 0    Sig: Take 2 tablets (40 mg total) by mouth daily with breakfast.

## 2019-11-04 ENCOUNTER — Telehealth: Payer: Self-pay | Admitting: Internal Medicine

## 2019-11-04 NOTE — Telephone Encounter (Signed)
[facility] Report/Call: ---The caller states that she took cetirizine and she thinks that she may be having a reaction to. She states that her eyes are swollen. Has hives. Is having swelling of her tongue and lips.  Advised see PCP within 4hours.  Patient was seen.

## 2019-11-07 ENCOUNTER — Ambulatory Visit (INDEPENDENT_AMBULATORY_CARE_PROVIDER_SITE_OTHER): Payer: 59 | Admitting: Family

## 2019-11-07 ENCOUNTER — Other Ambulatory Visit: Payer: Self-pay

## 2019-11-07 ENCOUNTER — Encounter: Payer: Self-pay | Admitting: Family

## 2019-11-07 VITALS — BP 120/82 | HR 78 | Temp 98.5°F | Ht 67.0 in | Wt 270.0 lb

## 2019-11-07 DIAGNOSIS — T7840XD Allergy, unspecified, subsequent encounter: Secondary | ICD-10-CM

## 2019-11-07 DIAGNOSIS — Z23 Encounter for immunization: Secondary | ICD-10-CM | POA: Diagnosis not present

## 2019-11-07 NOTE — Progress Notes (Signed)
Hannah Thompson is a 49 y.o. female with the following history as recorded in EpicCare:  Patient Active Problem List   Diagnosis Date Noted  . Hyperglycemia 06/20/2019  . Pulmonary embolism (HCC) 12/10/2017  . Recurrent pulmonary embolism (HCC) 02/26/2017  . Hypertension 10/17/2016  . Hypokalemia 10/17/2016  . Toxic conjunctivitis, left 08/12/2016  . Encounter for well adult exam with abnormal findings 06/27/2016  . Acute pain of right shoulder 06/27/2016  . Foreign body in right foot 03/27/2016  . Grade 2 ankle sprain 07/27/2015  . Headache 04/27/2015  . Hyperpigmentation 03/01/2015  . Skin trauma 03/01/2015  . Medial meniscus tear 01/13/2015  . Effusion of left knee 01/13/2015  . Encounter for therapeutic drug monitoring 07/07/2014  . Pedal edema 04/28/2014  . Chronic low back pain 02/03/2014  . Burn of right foot 12/19/2013  . Cellulitis 11/21/2013  . Therapeutic drug monitoring 11/21/2013  . Chronic anticoagulation 10/21/2013  . Pulmonary embolus (HCC) 10/15/2013  . Late effect of superficial injury 09/11/2013  . Pruritus 09/11/2013  . Acute pulmonary embolism (HCC) 09/04/2013  . Acute deep vein thrombosis (DVT) of other specified vein of right lower extremity (HCC) 09/04/2013  . Thrombocytopenia (HCC) 09/04/2013  . Syncope and collapse 09/04/2013  . Grief 08/18/2013  . Right arm pain 05/30/2013  . Angioedema 03/14/2013  . Obesity 05/13/2011  . KELOID 06/21/2009  . SKIN LESION 06/21/2009  . Headache(784.0) 06/21/2009  . VITAMIN B12 DEFICIENCY 03/18/2009  . Hyperlipidemia 03/17/2009  . ANEMIA-NOS 03/17/2009  . Allergic rhinitis 03/17/2009  . Asthma 03/17/2009  . ECZEMA 03/17/2009  . DISC DISEASE, LUMBAR 03/17/2009  . FATIGUE 03/17/2009    Current Outpatient Medications  Medication Sig Dispense Refill  . acetaminophen (TYLENOL) 325 MG tablet Take 2 tablets (650 mg total) by mouth every 6 (six) hours as needed for mild pain, moderate pain or headache (or Fever >/=  101).    . cyclobenzaprine (FLEXERIL) 10 MG tablet Take 1 tablet (10 mg total) by mouth 3 (three) times daily as needed for muscle spasms. 30 tablet 0  . dabigatran (PRADAXA) 150 MG CAPS capsule TAKE 1 CAPSULE BY MOUTH EVERY 12 HOURS 60 capsule 5  . HYDROcodone-acetaminophen (NORCO) 5-325 MG tablet Take 1 tablet by mouth every 6 (six) hours as needed for moderate pain. 15 tablet 0  . predniSONE (DELTASONE) 20 MG tablet Take 2 tablets (40 mg total) by mouth daily with breakfast. 10 tablet 0  . traMADol (ULTRAM) 50 MG tablet Take 1 tablet (50 mg total) by mouth every 6 (six) hours as needed. 30 tablet 0  . Vitamin D, Ergocalciferol, (DRISDOL) 1.25 MG (50000 UNIT) CAPS capsule Take 1 capsule (50,000 Units total) by mouth every 7 (seven) days. 12 capsule 0   No current facility-administered medications for this visit.    Allergies: Oxaprozin, Sulfonamide derivatives, Avocado, Orange juice [orange oil], Tomato, Zyrtec [cetirizine], Latex, and Nsaids  Past Medical History:  Diagnosis Date  . ALLERGIC RHINITIS 03/17/2009  . Anemia   . ASTHMA 03/17/2009  . DVT (deep venous thrombosis) (HCC)   . ECZEMA 03/17/2009  . Headache(784.0) 06/21/2009   recurrent  . HYPERLIPIDEMIA 03/17/2009  . KELOID 06/21/2009  . Pulmonary emboli (HCC)   . VITAMIN B12 DEFICIENCY 03/18/2009    Past Surgical History:  Procedure Laterality Date  . KELOID EXCISION  mid 1990's   s/p keloid removal-laser     Family History  Problem Relation Age of Onset  . Cancer Father        Prostate  Cancer  . Hypertension Father   . Allergies Sister   . Cancer Paternal Aunt        Ovarian Cancer  . Cancer Other        Grandparent-Lung Cancer  . Diabetes Other        Grandparent  . Heart disease Other        Grandparent  . Alcohol abuse Other        Several on both sides of family-3 uncles  . Hypertension Mother   . Diabetes Mother   . Deep vein thrombosis Sister     Social History   Tobacco Use  . Smoking status: Former  Smoker    Packs/day: 0.25    Years: 4.00    Pack years: 1.00    Types: Cigarettes    Start date: 03/17/2004    Quit date: 04/17/2008    Years since quitting: 11.5  . Smokeless tobacco: Never Used  . Tobacco comment: quit 5 years ago  Substance Use Topics  . Alcohol use: Yes    Alcohol/week: 0.0 standard drinks    Comment: socially    Subjective:  4 day follow up allergic reaction; has noticed marked improvement on Prednisone; facial swelling has almost completely resolved; using Aquaphor to help with dry skin on face/ eye lids; No difficulty breathing or swallowing;   Would like to get flu shot if possible;     Objective:  Vitals:   11/07/19 1359  BP: 120/82  Pulse: 78  Temp: 98.5 F (36.9 C)  TempSrc: Oral  SpO2: 98%  Weight: 270 lb (122.5 kg)  Height: 5\' 7"  (1.702 m)    General: Well developed, well nourished, in no acute distress  Skin : Warm and dry.  Head: Normocephalic and atraumatic  Lungs: Respirations unlabored;  Neurologic: Alert and oriented; speech intact; face symmetrical; moves all extremities well; CNII-XII intact without focal deficit   Assessment:  1. Allergic reaction, subsequent encounter   2. Needs flu shot     Plan:  Marked improvement noted today; finish Prednisone tomorrow as scheduled- do not feel she needs extended dose; okay to continue using Aquaphor;   Okay to give flu shot;   Follow-up with her PCP as scheduled;  This visit occurred during the SARS-CoV-2 public health emergency.  Safety protocols were in place, including screening questions prior to the visit, additional usage of staff PPE, and extensive cleaning of exam room while observing appropriate contact time as indicated for disinfecting solutions.     No follow-ups on file.  No orders of the defined types were placed in this encounter.   Requested Prescriptions    No prescriptions requested or ordered in this encounter

## 2019-11-07 NOTE — Addendum Note (Signed)
Addended by: Casper Harrison on: 11/07/2019 02:40 PM   Modules accepted: Orders

## 2020-01-12 ENCOUNTER — Inpatient Hospital Stay: Payer: 59 | Attending: Hematology & Oncology | Admitting: Hematology & Oncology

## 2020-01-12 ENCOUNTER — Inpatient Hospital Stay: Payer: 59

## 2020-01-12 ENCOUNTER — Telehealth: Payer: Self-pay | Admitting: Hematology & Oncology

## 2020-01-12 ENCOUNTER — Other Ambulatory Visit: Payer: Self-pay

## 2020-01-12 ENCOUNTER — Encounter: Payer: Self-pay | Admitting: Hematology & Oncology

## 2020-01-12 VITALS — BP 117/64 | HR 59 | Temp 98.0°F | Resp 18 | Wt 271.0 lb

## 2020-01-12 DIAGNOSIS — Z86711 Personal history of pulmonary embolism: Secondary | ICD-10-CM | POA: Diagnosis not present

## 2020-01-12 DIAGNOSIS — Z79899 Other long term (current) drug therapy: Secondary | ICD-10-CM | POA: Diagnosis not present

## 2020-01-12 DIAGNOSIS — I82491 Acute embolism and thrombosis of other specified deep vein of right lower extremity: Secondary | ICD-10-CM

## 2020-01-12 DIAGNOSIS — I82403 Acute embolism and thrombosis of unspecified deep veins of lower extremity, bilateral: Secondary | ICD-10-CM | POA: Diagnosis not present

## 2020-01-12 DIAGNOSIS — I2601 Septic pulmonary embolism with acute cor pulmonale: Secondary | ICD-10-CM | POA: Diagnosis not present

## 2020-01-12 DIAGNOSIS — D72819 Decreased white blood cell count, unspecified: Secondary | ICD-10-CM | POA: Diagnosis not present

## 2020-01-12 DIAGNOSIS — Z7901 Long term (current) use of anticoagulants: Secondary | ICD-10-CM | POA: Insufficient documentation

## 2020-01-12 DIAGNOSIS — D696 Thrombocytopenia, unspecified: Secondary | ICD-10-CM | POA: Diagnosis not present

## 2020-01-12 DIAGNOSIS — Z882 Allergy status to sulfonamides status: Secondary | ICD-10-CM | POA: Insufficient documentation

## 2020-01-12 DIAGNOSIS — M549 Dorsalgia, unspecified: Secondary | ICD-10-CM | POA: Diagnosis not present

## 2020-01-12 DIAGNOSIS — I2692 Saddle embolus of pulmonary artery without acute cor pulmonale: Secondary | ICD-10-CM

## 2020-01-12 DIAGNOSIS — Z886 Allergy status to analgesic agent status: Secondary | ICD-10-CM | POA: Diagnosis not present

## 2020-01-12 LAB — CBC WITH DIFFERENTIAL (CANCER CENTER ONLY)
Abs Immature Granulocytes: 0.02 10*3/uL (ref 0.00–0.07)
Basophils Absolute: 0 10*3/uL (ref 0.0–0.1)
Basophils Relative: 1 %
Eosinophils Absolute: 0.3 10*3/uL (ref 0.0–0.5)
Eosinophils Relative: 6 %
HCT: 36.3 % (ref 36.0–46.0)
Hemoglobin: 11.7 g/dL — ABNORMAL LOW (ref 12.0–15.0)
Immature Granulocytes: 1 %
Lymphocytes Relative: 49 %
Lymphs Abs: 2.1 10*3/uL (ref 0.7–4.0)
MCH: 27.4 pg (ref 26.0–34.0)
MCHC: 32.2 g/dL (ref 30.0–36.0)
MCV: 85 fL (ref 80.0–100.0)
Monocytes Absolute: 0.2 10*3/uL (ref 0.1–1.0)
Monocytes Relative: 4 %
Neutro Abs: 1.7 10*3/uL (ref 1.7–7.7)
Neutrophils Relative %: 39 %
Platelet Count: 203 10*3/uL (ref 150–400)
RBC: 4.27 MIL/uL (ref 3.87–5.11)
RDW: 14.4 % (ref 11.5–15.5)
WBC Count: 4.3 10*3/uL (ref 4.0–10.5)
nRBC: 0 % (ref 0.0–0.2)

## 2020-01-12 LAB — CMP (CANCER CENTER ONLY)
ALT: 12 U/L (ref 0–44)
AST: 13 U/L — ABNORMAL LOW (ref 15–41)
Albumin: 3.9 g/dL (ref 3.5–5.0)
Alkaline Phosphatase: 60 U/L (ref 38–126)
Anion gap: 5 (ref 5–15)
BUN: 8 mg/dL (ref 6–20)
CO2: 30 mmol/L (ref 22–32)
Calcium: 9.4 mg/dL (ref 8.9–10.3)
Chloride: 103 mmol/L (ref 98–111)
Creatinine: 0.68 mg/dL (ref 0.44–1.00)
GFR, Estimated: >60 mL/min (ref >60)
Glucose, Bld: 145 mg/dL — ABNORMAL HIGH (ref 70–99)
Potassium: 3.9 mmol/L (ref 3.5–5.1)
Sodium: 138 mmol/L (ref 135–145)
Total Bilirubin: 0.3 mg/dL (ref 0.3–1.2)
Total Protein: 7 g/dL (ref 6.5–8.1)

## 2020-01-12 NOTE — Progress Notes (Signed)
Hematology and Oncology Follow Up Visit  Hannah Thompson [id number] [birth year] 50 y.o. 01/12/2020   Principle Diagnosis:  Recurrent PE with right heart stain - Idiopathic Bilateral lower extremity DVT Ethnic associated leukopenia Transient thrombocytopenia  Current Therapy:        Pradaxa 110 mg po BID - long term maintanence   Interim History:  [name] is here today for follow-up.  Last saw her 6 months ago.  She has been doing pretty well.  She has been having some problems with her back.  I think her family doctor has been evaluating this.  She is doing well on the Pradaxa.  She has had no problems with respect to her legs.  There is been no leg swelling.  She has had no problems with cough or shortness of breath.  There is been no chest wall pain.  She has had no change in bowel or bladder habits.  There has been no issue with bleeding.  Her birthday is tomorrow.  Hopefully she will be able to enjoy it.  She wants to lose a little bit of weight.  I certainly can understand this.  Overall, performance status is ECOG 1.    Medications:  Allergies as of 01/12/2020      Reactions   Oxaprozin    angioedema   Sulfonamide Derivatives    REACTION: hives   Avocado    Orange Juice [orange Oil] Hives   Orange juice   Tomato Hives   tomatoes   Zyrtec [cetirizine]    Latex Rash   Nsaids Rash      Medication List       Accurate as of January 12, 2020  9:22 AM. If you have any questions, ask your nurse or doctor.        acetaminophen 325 MG tablet Commonly known as: TYLENOL Take 2 tablets (650 mg total) by mouth every 6 (six) hours as needed for mild pain, moderate pain or headache (or Fever >/= 101).   cyclobenzaprine 10 MG tablet Commonly known as: FLEXERIL Take 1 tablet (10 mg total) by mouth 3 (three) times daily as needed for muscle spasms.   dabigatran 150 MG Caps capsule Commonly known as: Pradaxa TAKE 1 CAPSULE BY MOUTH EVERY 12 HOURS    HYDROcodone-acetaminophen 5-325 MG tablet Commonly known as: Norco Take 1 tablet by mouth every 6 (six) hours as needed for moderate pain.   predniSONE 20 MG tablet Commonly known as: DELTASONE Take 2 tablets (40 mg total) by mouth daily with breakfast.   traMADol 50 MG tablet Commonly known as: ULTRAM Take 1 tablet (50 mg total) by mouth every 6 (six) hours as needed.   Vitamin D (Ergocalciferol) 1.25 MG (50000 UNIT) Caps capsule Commonly known as: DRISDOL Take 1 capsule (50,000 Units total) by mouth every 7 (seven) days.       Allergies:  Allergies  Allergen Reactions  . Oxaprozin     angioedema  . Sulfonamide Derivatives     REACTION: hives  . Avocado   . Orange Juice [Orange Oil] Hives    Orange juice  . Tomato Hives    tomatoes  . Zyrtec [Cetirizine]   . Latex Rash  . Nsaids Rash    Past Medical History, Surgical history, Social history, and Family History were reviewed and updated.  Review of Systems: Review of Systems  Constitutional: Negative.   HENT: Negative.   Eyes: Negative.   Respiratory: Negative.   Cardiovascular: Negative.   Gastrointestinal: Negative.  Genitourinary: Negative.   Musculoskeletal: Positive for back pain.  Skin: Negative.   Neurological: Negative.   Endo/Heme/Allergies: Negative.   Psychiatric/Behavioral: Negative.      Physical Exam:  weight is 271 lb (122.9 kg). Her oral temperature is 98 F (36.7 C). Her blood pressure is 117/64 and her pulse is 59 (abnormal). Her respiration is 18 and oxygen saturation is 100%.   Wt Readings from Last 3 Encounters:  01/12/20 271 lb (122.9 kg)  11/07/19 270 lb (122.5 kg)  11/03/19 272 lb 3.2 oz (123.5 kg)    Ocular: Sclerae unicteric, pupils equal, round and reactive to light Ear-nose-throat: Oropharynx clear, dentition fair Lymphatic: No cervical or supraclavicular adenopathy Lungs no rales or rhonchi, good excursion bilaterally Heart regular rate and rhythm, no murmur  appreciated Abd soft, nontender, positive bowel sounds, no liver or spleen tip palpated on exam, no fluid wave  MSK no focal spinal tenderness, no joint edema Neuro: non-focal, well-oriented, appropriate affect Breasts: Deferred  Lab Results  Component Value Date   WBC 4.3 01/12/2020   HGB 11.7 (L) 01/12/2020   HCT 36.3 01/12/2020   MCV 85.0 01/12/2020   PLT 203 01/12/2020   Lab Results  Component Value Date   FERRITIN 242 08/16/2015   IRON 76 08/16/2015   TIBC 251 08/16/2015   UIBC 175 08/16/2015   IRONPCTSAT 30 08/16/2015   Lab Results  Component Value Date   RBC 4.27 01/12/2020   No results found for: KPAFRELGTCHN, LAMBDASER, KAPLAMBRATIO No results found for: Loel Lofty, IGMSERUM No results found for: Marda Stalker, SPEI   Chemistry      Component Value Date/Time   NA 138 01/12/2020 0834   NA 139 10/23/2016 1306   NA 140 09/25/2016 1145   K 3.9 01/12/2020 0834   K 3.8 10/23/2016 1306   K 4.1 09/25/2016 1145   CL 103 01/12/2020 0834   CL 107 10/23/2016 1306   CO2 30 01/12/2020 0834   CO2 26 10/23/2016 1306   CO2 28 09/25/2016 1145   BUN 8 01/12/2020 0834   BUN 8 10/23/2016 1306   BUN 9.5 09/25/2016 1145   CREATININE 0.68 01/12/2020 0834   CREATININE 0.8 10/23/2016 1306   CREATININE 0.8 09/25/2016 1145      Component Value Date/Time   CALCIUM 9.4 01/12/2020 0834   CALCIUM 8.7 10/23/2016 1306   CALCIUM 9.6 09/25/2016 1145   ALKPHOS 60 01/12/2020 0834   ALKPHOS 67 10/23/2016 1306   ALKPHOS 72 09/25/2016 1145   AST 13 (L) 01/12/2020 0834   AST 17 09/25/2016 1145   ALT 12 01/12/2020 0834   ALT 26 10/23/2016 1306   ALT 11 09/25/2016 1145   BILITOT 0.3 01/12/2020 0834   BILITOT 0.26 09/25/2016 1145       Impression and Plan: Hannah Thompson is a very pleasant 50 yo African [place] female with significant history of thromboembolic disease with a pulmonary embolism with right heart strain as well as  bilateral lower extremity DVT's (while off of Pradaxa for 2 weeks).   Of note, her last CT angiogram was done back in March 2020.  There is no evidence of residual pulmonary emboli.  Last lower extremity Dopplers were also done back in March 2020.  There is no evidence of residual or chronic thromboembolic disease.  She is doing well and taking her Pradaxa as prescribed. No new recurrences.   She will continue her same regimen and we will plan to see her  again in another 6 months.   She will contact our office with any questions or concerns. We can certainly see her sooner if needed.   Josph Macho, MD 1/10/20229:22 AM

## 2020-01-12 NOTE — Telephone Encounter (Signed)
Appointments scheduled per 1/10 los patient has My Chart access for updates

## 2020-02-21 ENCOUNTER — Emergency Department (HOSPITAL_BASED_OUTPATIENT_CLINIC_OR_DEPARTMENT_OTHER): Payer: 59

## 2020-02-21 ENCOUNTER — Encounter (HOSPITAL_BASED_OUTPATIENT_CLINIC_OR_DEPARTMENT_OTHER): Payer: Self-pay | Admitting: Emergency Medicine

## 2020-02-21 ENCOUNTER — Emergency Department (HOSPITAL_BASED_OUTPATIENT_CLINIC_OR_DEPARTMENT_OTHER)
Admission: EM | Admit: 2020-02-21 | Discharge: 2020-02-21 | Disposition: A | Discharge status: Home / Self Care | Payer: 59 | Attending: Emergency Medicine | Admitting: Emergency Medicine

## 2020-02-21 ENCOUNTER — Other Ambulatory Visit: Payer: Self-pay

## 2020-02-21 DIAGNOSIS — Z87891 Personal history of nicotine dependence: Secondary | ICD-10-CM | POA: Diagnosis not present

## 2020-02-21 DIAGNOSIS — R079 Chest pain, unspecified: Secondary | ICD-10-CM | POA: Diagnosis not present

## 2020-02-21 DIAGNOSIS — Z86711 Personal history of pulmonary embolism: Secondary | ICD-10-CM

## 2020-02-21 DIAGNOSIS — R0602 Shortness of breath: Secondary | ICD-10-CM | POA: Insufficient documentation

## 2020-02-21 DIAGNOSIS — Z91040 Latex allergy status: Secondary | ICD-10-CM | POA: Insufficient documentation

## 2020-02-21 DIAGNOSIS — J45909 Unspecified asthma, uncomplicated: Secondary | ICD-10-CM | POA: Diagnosis not present

## 2020-02-21 DIAGNOSIS — Z7901 Long term (current) use of anticoagulants: Secondary | ICD-10-CM | POA: Insufficient documentation

## 2020-02-21 LAB — HEPATIC FUNCTION PANEL
ALT: 14 U/L (ref 0–44)
AST: 17 U/L (ref 15–41)
Albumin: 4 g/dL (ref 3.5–5.0)
Alkaline Phosphatase: 58 U/L (ref 38–126)
Bilirubin, Direct: <0.1 mg/dL (ref 0.0–0.2)
Total Bilirubin: 0.2 mg/dL — ABNORMAL LOW (ref 0.3–1.2)
Total Protein: 7.7 g/dL (ref 6.5–8.1)

## 2020-02-21 LAB — BASIC METABOLIC PANEL
Anion gap: 8 (ref 5–15)
BUN: 10 mg/dL (ref 6–20)
CO2: 25 mmol/L (ref 22–32)
Calcium: 8.8 mg/dL — ABNORMAL LOW (ref 8.9–10.3)
Chloride: 104 mmol/L (ref 98–111)
Creatinine, Ser: 0.62 mg/dL (ref 0.44–1.00)
GFR, Estimated: >60 mL/min (ref >60)
Glucose, Bld: 95 mg/dL (ref 70–99)
Potassium: 3.8 mmol/L (ref 3.5–5.1)
Sodium: 137 mmol/L (ref 135–145)

## 2020-02-21 LAB — CBC
HCT: 36.1 % (ref 36.0–46.0)
Hemoglobin: 11.8 g/dL — ABNORMAL LOW (ref 12.0–15.0)
MCH: 27.6 pg (ref 26.0–34.0)
MCHC: 32.7 g/dL (ref 30.0–36.0)
MCV: 84.5 fL (ref 80.0–100.0)
Platelets: 198 10*3/uL (ref 150–400)
RBC: 4.27 MIL/uL (ref 3.87–5.11)
RDW: 14.9 % (ref 11.5–15.5)
WBC: 4.1 10*3/uL (ref 4.0–10.5)
nRBC: 0 % (ref 0.0–0.2)

## 2020-02-21 LAB — TROPONIN I (HIGH SENSITIVITY)
Troponin I (High Sensitivity): 3 ng/L (ref <18)
Troponin I (High Sensitivity): 2 ng/L (ref <18)

## 2020-02-21 LAB — LIPASE, BLOOD: Lipase: 27 U/L (ref 11–51)

## 2020-02-21 MED ORDER — IOHEXOL 350 MG/ML SOLN
100.0000 mL | Freq: Once | INTRAVENOUS | Status: AC | PRN
  Administered 2020-02-21: 100 mL via INTRAVENOUS

## 2020-02-21 NOTE — ED Triage Notes (Signed)
Reports sharp epigastric pain that started around 1200.  Hx of PE.

## 2020-02-21 NOTE — Discharge Instructions (Addendum)
As we discussed I would recommend considering strategies to try and help increase how often you are taking your blood thinning medicine. Today your CT scan on your chest did not show a blood clot. Please schedule a follow-up appointment with your primary care doctor to discuss your ED visit and symptoms today. If your symptoms change, worsen, or you have any new concerns please seek additional medical care and evaluation.  As we discussed today you had 2 normal heart enzymes which is reassuring.  We discussed role of admission versus discharge with close outpatient follow-up and you elected for close follow-up. Please make sure that you schedule a follow-up appointment with your primary care doctor.

## 2020-02-21 NOTE — ED Provider Notes (Signed)
[facility] EMERGENCY DEPARTMENT Provider Note   CSN: [id number] Arrival date & time: 02/21/20  1543     History Chief Complaint  Patient presents with  . Chest Pain    Hannah Thompson is a 50 y.o. female with a past medical history of multiple PEs, DVT, chronically anticoagulated with Pradaxa who presents today for evaluation of sudden onset of midline chest pain that started at noon. 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  She reports that she misses her bedtime dose of Pradaxa about twice a week.  She reports mild shortness of breath.  She denies any fevers or known sick contacts.  She states that this does feel like one of her PEs that she has had in the past however she cannot remember if it was the second or third time that she had a PE. She denies any swelling.  The pain does not radiate or move.  She denies any nausea, vomiting, diaphoresis or abdominal pain.  She has found no aggravating or alleviating symptoms.     Additionally patient reports that her left wrist has been sore.  It is not her left arm, and has started earlier today before her chest pain.  She states that she thinks she may have hit it on the nightstand at night however does not recall this. HPI     Past Medical History:  Diagnosis Date  . ALLERGIC RHINITIS 03/17/2009  . Anemia   . ASTHMA 03/17/2009  . DVT (deep venous thrombosis) (HCC)   . ECZEMA 03/17/2009  . Headache(784.0) 06/21/2009   recurrent  . HYPERLIPIDEMIA 03/17/2009  . KELOID 06/21/2009  . Pulmonary emboli (HCC)   . VITAMIN B12 DEFICIENCY 03/18/2009    Patient Active Problem List   Diagnosis Date Noted  . Hyperglycemia 06/20/2019  . Pulmonary embolism (HCC) 12/10/2017  . Recurrent pulmonary embolism (HCC) 02/26/2017  . Hypertension 10/17/2016  . Hypokalemia 10/17/2016  . Toxic conjunctivitis, left 08/12/2016  . Encounter for well adult exam with abnormal findings 06/27/2016  . Acute pain of  right shoulder 06/27/2016  . Foreign body in right foot 03/27/2016  . Grade 2 ankle sprain 07/27/2015  . Headache 04/27/2015  . Hyperpigmentation 03/01/2015  . Skin trauma 03/01/2015  . Medial meniscus tear 01/13/2015  . Effusion of left knee 01/13/2015  . Encounter for therapeutic drug monitoring 07/07/2014  . Pedal edema 04/28/2014  . Chronic low back pain 02/03/2014  . Burn of right foot 12/19/2013  . Cellulitis 11/21/2013  . Therapeutic drug monitoring 11/21/2013  . Chronic anticoagulation 10/21/2013  . Pulmonary embolus (HCC) 10/15/2013  . Late effect of superficial injury 09/11/2013  . Pruritus 09/11/2013  . Acute pulmonary embolism (HCC) 09/04/2013  . Acute deep vein thrombosis (DVT) of other specified vein of right lower extremity (HCC) 09/04/2013  . Thrombocytopenia (HCC) 09/04/2013  . Syncope and collapse 09/04/2013  . Grief 08/18/2013  . Right arm pain 05/30/2013  . Angioedema 03/14/2013  . Obesity 05/13/2011  . KELOID 06/21/2009  . SKIN LESION 06/21/2009  . Headache(784.0) 06/21/2009  . VITAMIN B12 DEFICIENCY 03/18/2009  . Hyperlipidemia 03/17/2009  . ANEMIA-NOS 03/17/2009  . Allergic rhinitis 03/17/2009  . Asthma 03/17/2009  . ECZEMA 03/17/2009  . DISC DISEASE, LUMBAR 03/17/2009  . FATIGUE 03/17/2009    Past Surgical History:  Procedure Laterality Date  . KELOID EXCISION  mid 1990's   s/p keloid removal-laser      OB History  Gravida  5   Para  3   Term  3   Preterm      AB  2   Living  3     SAB  1   IAB  1   Ectopic      Multiple      Live Births              Family History  Problem Relation Age of Onset  . Cancer Father        Prostate Cancer  . Hypertension Father   . Allergies Sister   . Cancer Paternal Aunt        Ovarian Cancer  . Cancer Other        Grandparent-Lung Cancer  . Diabetes Other        Grandparent  . Heart disease Other        Grandparent  . Alcohol abuse Other        Several on both sides of  family-3 uncles  . Hypertension Mother   . Diabetes Mother   . Deep vein thrombosis Sister     Social History   Tobacco Use  . Smoking status: Former Smoker    Packs/day: 0.25    Years: 4.00    Pack years: 1.00    Types: Cigarettes    Start date: 03/17/2004    Quit date: 04/17/2008    Years since quitting: 11.8  . Smokeless tobacco: Never Used  . Tobacco comment: quit 5 years ago  Vaping Use  . Vaping Use: Never used  Substance Use Topics  . Alcohol use: Yes    Alcohol/week: 0.0 standard drinks    Comment: socially  . Drug use: No    Home Medications Prior to Admission medications   Medication Sig Start Date End Date Taking? Authorizing Provider  acetaminophen (TYLENOL) 325 MG tablet Take 2 tablets (650 mg total) by mouth every 6 (six) hours as needed for mild pain, moderate pain or headache (or Fever >/= 101). 09/08/13  Yes Hongalgi, Maximino GreenlandAnand D, MD  dabigatran (PRADAXA) 150 MG CAPS capsule TAKE 1 CAPSULE BY MOUTH EVERY 12 HOURS 07/08/19  Yes [place], Brand MalesSarah M, NP  cyclobenzaprine (FLEXERIL) 10 MG tablet Take 1 tablet (10 mg total) by mouth 3 (three) times daily as needed for muscle spasms. 06/11/19   Olive BassMurray, Laura Woodruff, FNP  HYDROcodone-acetaminophen (NORCO) 5-325 MG tablet Take 1 tablet by mouth every 6 (six) hours as needed for moderate pain. 06/11/19   Olive BassMurray, Laura Woodruff, FNP  predniSONE (DELTASONE) 20 MG tablet Take 2 tablets (40 mg total) by mouth daily with breakfast. 11/03/19   Olive BassMurray, Laura Woodruff, FNP  traMADol (ULTRAM) 50 MG tablet Take 1 tablet (50 mg total) by mouth every 6 (six) hours as needed. 06/20/19   Corwin LevinsJohn, James W, MD  Vitamin D, Ergocalciferol, (DRISDOL) 1.25 MG (50000 UNIT) CAPS capsule Take 1 capsule (50,000 Units total) by mouth every 7 (seven) days. 06/20/19   Corwin LevinsJohn, James W, MD    Allergies    Oxaprozin, Sulfonamide derivatives, Fish allergy, Avocado, Orange juice [orange oil], Tomato, Zyrtec [cetirizine], Latex, and Nsaids  Review of Systems   Review  of Systems  Constitutional: Negative for chills and fever.  HENT: Negative for congestion.   Respiratory: Positive for shortness of breath. Negative for cough.   Cardiovascular: Positive for chest pain. Negative for palpitations and leg swelling.  Gastrointestinal: Negative for abdominal pain, diarrhea, nausea and vomiting.  Genitourinary: Negative for dysuria.  Musculoskeletal: Negative for  back pain and neck pain.  Skin: Negative for pallor.  Neurological: Negative for weakness and headaches.  Psychiatric/Behavioral: Negative for confusion.    Physical Exam Updated Vital Signs BP (!) 141/78   Pulse (!) 55   Temp 97.7 F (36.5 C) (Oral)   Resp 16   Ht 5\' 7"  (1.702 m)   Wt 122.5 kg   SpO2 99%   BMI 42.29 kg/m   Physical Exam Vitals and nursing note reviewed.  Constitutional:      General: She is not in acute distress.    Appearance: She is not diaphoretic.  HENT:     Head: Normocephalic and atraumatic.  Eyes:     General: No scleral icterus.       Right eye: No discharge.        Left eye: No discharge.     Conjunctiva/sclera: Conjunctivae normal.  Cardiovascular:     Rate and Rhythm: Normal rate and regular rhythm.     Pulses:          Radial pulses are 2+ on the right side and 2+ on the left side.       Dorsalis pedis pulses are 2+ on the right side and 2+ on the left side.       Posterior tibial pulses are 2+ on the right side and 2+ on the left side.     Heart sounds: Normal heart sounds. No murmur heard.   Pulmonary:     Effort: Pulmonary effort is normal. No respiratory distress.     Breath sounds: Normal breath sounds. No stridor.  Chest:     Chest wall: Tenderness (Patient does have mild anterior chest wall tenderness to palpation however she reports that this is distinctly different than her ongoing epigastric pain.) present.  Abdominal:     General: There is no distension.     Palpations: Abdomen is soft.     Tenderness: There is no abdominal  tenderness.  Musculoskeletal:        General: No deformity.     Cervical back: Normal range of motion.     Right lower leg: No tenderness. No edema.     Left lower leg: No tenderness. No edema.     Comments: Left wrist has full active range of motion.  Strength is intact.  Mild tenderness with palpation over the dorsum.  No crepitus or deformity.  Skin:    General: Skin is warm and dry.  Neurological:     Mental Status: She is alert.     Motor: No abnormal muscle tone.     Comments: Patient is awake and alert, sensation intact to bilateral upper and lower extremities.  Psychiatric:        Behavior: Behavior normal.     ED Results / Procedures / Treatments   Labs (all labs ordered are listed, but only abnormal results are displayed) Labs Reviewed  BASIC METABOLIC PANEL - Abnormal; Notable for the following components:      Result Value   Calcium 8.8 (*)    All other components within normal limits  CBC - Abnormal; Notable for the following components:   Hemoglobin 11.8 (*)    All other components within normal limits  HEPATIC FUNCTION PANEL - Abnormal; Notable for the following components:   Total Bilirubin 0.2 (*)    All other components within normal limits  LIPASE, BLOOD  PREGNANCY, URINE  TROPONIN I (HIGH SENSITIVITY)  TROPONIN I (HIGH SENSITIVITY)    EKG EKG Interpretation  Date/Time:  Saturday February 21 2020 15:48:22 EST Ventricular Rate:  54 PR Interval:  150 QRS Duration: 82 QT Interval:  502 QTC Calculation: 476 R Axis:   14 Text Interpretation: Sinus bradycardia Nonspecific T wave abnormality Prolonged QT Abnormal ECG Confirmed by Rolan Bucco [phone]) on 02/21/2020 5:43:42 PM   Radiology DG Chest 2 View  Result Date: 02/21/2020 CLINICAL DATA:  Pain.  Epigastric pain. EXAM: CHEST - 2 VIEW COMPARISON:  December 09, 2017 FINDINGS: The heart size and mediastinal contours are within normal limits. Both lungs are clear. The visualized skeletal structures are  unremarkable. IMPRESSION: No active cardiopulmonary disease. Electronically Signed   By: Katherine Mantle M.D.   On: 02/21/2020 16:25   DG Wrist Complete Left  Result Date: 02/21/2020 CLINICAL DATA:  Pain EXAM: LEFT WRIST - COMPLETE 3+ VIEW COMPARISON:  None. FINDINGS: There is no evidence of fracture or dislocation. There is no evidence of arthropathy or other focal bone abnormality. Soft tissues are unremarkable. IMPRESSION: Negative. Electronically Signed   By: Katherine Mantle M.D.   On: 02/21/2020 16:26   CT Angio Chest PE W/Cm &/Or Wo Cm  Result Date: 02/21/2020 CLINICAL DATA:  Sharp epigastric pain which began around 1200 hours, history of pulmonary embolism EXAM: CT ANGIOGRAPHY CHEST WITH CONTRAST TECHNIQUE: Multidetector CT imaging of the chest was performed using the standard protocol during bolus administration of intravenous contrast. Multiplanar CT image reconstructions and MIPs were obtained to evaluate the vascular anatomy. CONTRAST:  OMNIPAQUE IOHEXOL 350 MG/ML SOLN COMPARISON:  CT angiography 03/07/2018 , chest radiograph 02/21/2020 FINDINGS: Cardiovascular: Satisfactory opacification the pulmonary arteries to the segmental level. No pulmonary artery filling defects are identified. Central pulmonary arteries are normal caliber. Normal heart size. No pericardial effusion. The aorta is normal caliber. No acute luminal abnormality of the imaged aorta. No periaortic stranding or hemorrhage. Normal 3 vessel branching of the aortic arch. Proximal great vessels are slightly tortuous but otherwise unremarkable. No major venous abnormalities. Mediastinum/Nodes: No mediastinal fluid or gas. Normal thyroid gland and thoracic inlet. No acute abnormality of the trachea or esophagus. No worrisome mediastinal, hilar or axillary adenopathy. Lungs/Pleura: Some dependent regions of ground-glass likely reflect dependent atelectasis. No consolidative process. No pneumothorax or effusion. Airways are  patent. Stable appearance of several anterior right upper lobe subcentimeter nodules versus pulmonary vascular malformation (5/31). No new concerning pulmonary nodules or masses. Upper Abdomen: Diffuse hepatic hypoattenuation compatible with hepatic steatosis. Sparing along the gallbladder fossa. No acute abnormalities present in the visualized portions of the upper abdomen. Musculoskeletal: Multilevel degenerative changes are present in the imaged portions of the spine. No acute osseous abnormality or suspicious osseous lesion. No worrisome chest wall masses or lesions. Review of the MIP images confirms the above findings. IMPRESSION: 1. No evidence of pulmonary embolism. 2. No acute intrathoracic process. 3. Stable subcentimeter nodules versus small pulmonary vascular malformations in the anterior right upper lobe. 4. Hepatic steatosis. Electronically Signed   By: Kreg Shropshire M.D.   On: 02/21/2020 20:10    Procedures Procedures   Medications Ordered in ED Medications  iohexol (OMNIPAQUE) 350 MG/ML injection 100 mL (100 mLs Intravenous Contrast Given 02/21/20 1949)    ED Course  I have reviewed the triage vital signs and the nursing notes.  Pertinent labs & imaging results that were available during my care of the patient were reviewed by me and considered in my medical decision making (see chart for details).    MDM Rules/Calculators/A&P  Patient is a 50 year old woman with a extensive past medical history including multiple PEs who presents today for evaluation of sharp acute epigastric chest pain. She reports that she has missed doses on her anticoagulation, and that she misses about 2-3 of her nighttime doses in an average week.  CBC, BMP, hepatic function panel, and lipase are all unremarkable. Troponin x2 is not elevated.  Given her extensive history CTA PE study was obtained without evidence of PE or other acute intrathoracic process. She did have incidental  findings that were discussed and recommended PCP follow-up for these.  Patient does have some risk factors for cardiac cause of her pain. While she does not have any prior MIs and her symptoms do not sound like a classic cardiac chest pain we discussed possibility of admission for ACS rule out through multiple troponins. Patient declined this stating that she wants to go home, that her primary concern today was that she may have a PE. I discussed the importance of close outpatient follow-up and she states her understanding. Additionally discussed the importance of altering her habits to ensure higher compliance with her anticoagulation and she states her understanding.  Return precautions were discussed with patient who states their understanding.  At the time of discharge patient denied any unaddressed complaints or concerns.  Patient is agreeable for discharge home.  Note: Portions of this report may have been transcribed using voice recognition software. Every effort was made to ensure accuracy; however, inadvertent computerized transcription errors may be present   Final Clinical Impression(s) / ED Diagnoses Final diagnoses:  Chest pain, unspecified type  History of pulmonary embolus (PE)  Chronic anticoagulation    Rx / DC Orders ED Discharge Orders    None       Cristina Gong, PA-C 02/22/20 0011    Rolan Bucco, MD 02/22/20 1650

## 2020-02-23 ENCOUNTER — Telehealth: Payer: Self-pay | Admitting: *Deleted

## 2020-02-23 NOTE — Telephone Encounter (Signed)
Message received from patient stating that she was in the ER over the weekend with chest pain and would like to know if she should f/u with her PCP or with Dr. Myna Hidalgo.  Call placed back to patient and patient notified per order of Dr. Myna Hidalgo to f/u with PCP d/t pt.'s CT Angio chest was negative for PE.  Pt appreciative of call back and states that she will call her PCP now.

## 2020-02-25 ENCOUNTER — Ambulatory Visit: Payer: 59 | Admitting: Internal Medicine

## 2020-02-25 ENCOUNTER — Encounter: Payer: Self-pay | Admitting: Internal Medicine

## 2020-02-25 ENCOUNTER — Ambulatory Visit (INDEPENDENT_AMBULATORY_CARE_PROVIDER_SITE_OTHER): Payer: 59 | Admitting: Internal Medicine

## 2020-02-25 ENCOUNTER — Other Ambulatory Visit: Payer: Self-pay

## 2020-02-25 VITALS — BP 136/84 | HR 62 | Temp 97.9°F | Ht 67.0 in | Wt 275.0 lb

## 2020-02-25 DIAGNOSIS — R87619 Unspecified abnormal cytological findings in specimens from cervix uteri: Secondary | ICD-10-CM | POA: Insufficient documentation

## 2020-02-25 DIAGNOSIS — Z6841 Body Mass Index (BMI) 40.0 and over, adult: Secondary | ICD-10-CM

## 2020-02-25 DIAGNOSIS — E538 Deficiency of other specified B group vitamins: Secondary | ICD-10-CM | POA: Diagnosis not present

## 2020-02-25 DIAGNOSIS — E785 Hyperlipidemia, unspecified: Secondary | ICD-10-CM | POA: Diagnosis not present

## 2020-02-25 DIAGNOSIS — R739 Hyperglycemia, unspecified: Secondary | ICD-10-CM | POA: Diagnosis not present

## 2020-02-25 DIAGNOSIS — M79642 Pain in left hand: Secondary | ICD-10-CM

## 2020-02-25 DIAGNOSIS — E559 Vitamin D deficiency, unspecified: Secondary | ICD-10-CM | POA: Diagnosis not present

## 2020-02-25 DIAGNOSIS — M940 Chondrocostal junction syndrome [Tietze]: Secondary | ICD-10-CM

## 2020-02-25 MED ORDER — MELOXICAM 15 MG PO TABS: 15.0000 mg | ORAL_TABLET | Freq: Every day | ORAL | 1 refills | Status: DC | PRN

## 2020-02-25 MED ORDER — PHENTERMINE HCL 37.5 MG PO CAPS: 37.5000 mg | ORAL_CAPSULE | ORAL | 2 refills | Status: DC

## 2020-02-25 NOTE — Patient Instructions (Signed)
Please take all new medication as prescribed - the phentermine for wt loss as well as the mobic as needed for pain  Please consider seeing Dr Katrinka Blazing with Sports Medicine for the tendonitis to the left arm  Please take OTC Vitamin D3 at 2000 units per day, indefinitely.  Please continue all other medications as before, and refills have been done if requested.  Please have the pharmacy call with any other refills you may need.  Please continue your efforts at being more active, low cholesterol diet, and weight control.  Please keep your appointments with your specialists as you may have planned  Please make an Appointment to return in 3 months, or sooner if needed, also with Lab Appointment for testing done 3-5 days before at the FIRST FLOOR Lab (so this is for TWO appointments - please see the scheduling desk as you leave)  Due to the ongoing Covid 19 pandemic, our lab now requires an appointment for any labs done at our office.  If you need labs done and do not have an appointment, please call our office ahead of time to schedule before presenting to the lab for your testing.

## 2020-02-25 NOTE — Progress Notes (Signed)
Patient ID: Hannah Thompson, female   DOB: 19-Nov-1970, 50 y.o.   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  Also has a focal area left chest parasternal tender sorenss pleuritic x 1 wk without sob, n/v, palps, dizziness or diaphoresis, whic is not positional or exertional.  Has low vit d, not taking vit d otc.  Cannot seem to lose wt - asks for tx, despite working on activity and lower calories.  Denies new focal neuro s/s.   Pt denies polydipsia, polyuria,  Pt denies fever, wt loss, night sweats, loss of appetite, or other constitutional symptoms  Wt Readings from Last 3 Encounters:  02/25/20 275 lb (124.7 kg)  02/21/20 270 lb (122.5 kg)  01/12/20 271 lb (122.9 kg)   BP Readings from Last 3 Encounters:  02/25/20 136/84  02/21/20 (!) 141/78  01/12/20 117/64         Past Medical History:  Diagnosis Date  . ALLERGIC RHINITIS 03/17/2009  . Anemia   . ASTHMA 03/17/2009  . DVT (deep venous thrombosis) (HCC)   . ECZEMA 03/17/2009  . Headache(784.0) 06/21/2009   recurrent  . HYPERLIPIDEMIA 03/17/2009  . KELOID 06/21/2009  . Pulmonary emboli (HCC)   . VITAMIN B12 DEFICIENCY 03/18/2009   Past Surgical History:  Procedure Laterality Date  . KELOID EXCISION  mid 1990's   s/p keloid removal-laser     reports that she quit smoking about 11 years ago. Her smoking use included cigarettes. She started smoking about 15 years ago. She has a 1.00 pack-year smoking history. She has never used smokeless tobacco. She reports current alcohol use. She reports that she does not use drugs. family history includes Alcohol abuse in an other family member; Allergies in her sister; Cancer in her father, paternal aunt, and another family member; Deep vein thrombosis in her  sister; Diabetes in her mother and another family member; Heart disease in an other family member; Hypertension in her father and mother. Allergies  Allergen Reactions  . Oxaprozin     angioedema  . Sulfonamide Derivatives     REACTION: hives  . Fish Allergy Swelling    Pollock  . Avocado   . Orange Juice [Orange Oil] Hives    Orange juice  . Tomato Hives    tomatoes  . Zyrtec [Cetirizine]   . Latex Rash  . Nsaids Rash   Current Outpatient Medications on File Prior to Visit  Medication Sig Dispense Refill  . acetaminophen (TYLENOL) 325 MG tablet Take 2 tablets (650 mg total) by mouth every 6 (six) hours as needed for mild pain, moderate pain or headache (or Fever >/= 101).     No current facility-administered medications on file prior to visit.        ROS:  All others reviewed and negative.  Objective        PE:  BP 136/84   Pulse 62   Temp 97.9 F (36.6 C) (Oral)   Ht 5\' 7"  (1.702 m)   Wt 275 lb (124.7 kg)   SpO2 96%   BMI 43.07 kg/m  Constitutional: Pt appears in NAD               HENT: Head: NCAT.                Right Ear: External ear normal.                 Left Ear: External ear normal.                Eyes: . Pupils are equal, round, and reactive to light. Conjunctivae and EOM are normal               Nose: without d/c or deformity               Neck: Neck supple. Gross normal ROM               Cardiovascular: Normal rate and regular rhythm.                 Pulmonary/Chest: Effort normal and breath sounds without rales or wheezing. ; also has tender area left mid sternal/parasterna               Left 5th ray hand and wrist tender sweling               Neurological: Pt is alert. At baseline orientation, motor grossly intact               Skin: Skin is warm. No rashes, no other new lesions, LE edema -none               Psychiatric: Pt behavior is normal without agitation   Micro: none  Cardiac tracings I have personally interpreted today:   none  Pertinent Radiological findings (summarize): none   Lab Results  Component Value Date   WBC 4.1 02/21/2020   HGB 11.8 (L) 02/21/2020   HCT 36.1 02/21/2020   PLT 198 02/21/2020   GLUCOSE 95 02/21/2020   CHOL 155 06/20/2019   TRIG 159.0 (H) 06/20/2019   HDL 37.90 (L) 06/20/2019   LDLCALC 85 06/20/2019   ALT 14 02/21/2020   AST 17 02/21/2020   NA 137 02/21/2020   K 3.8 02/21/2020   CL 104 02/21/2020   CREATININE 0.62 02/21/2020   BUN 10 02/21/2020   CO2 25 02/21/2020   TSH 2.18 06/20/2019   INR 1.01 12/09/2017   HGBA1C 6.3 06/20/2019   Assessment/Plan:  Hannah Thompson is a 50 y.o. Black or African American [2] female with  has a past medical history of ALLERGIC RHINITIS (03/17/2009), Anemia, ASTHMA (03/17/2009), DVT (deep venous thrombosis) (HCC), ECZEMA (03/17/2009), Headache(784.0) (06/21/2009), HYPERLIPIDEMIA (03/17/2009), KELOID (06/21/2009), Pulmonary emboli (HCC), and VITAMIN B12 DEFICIENCY (03/18/2009).  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  Oliver Barre, MD 03/01/2020 9:25 PM [facility]

## 2020-02-28 ENCOUNTER — Other Ambulatory Visit: Payer: Self-pay | Admitting: Family

## 2020-02-28 DIAGNOSIS — I2692 Saddle embolus of pulmonary artery without acute cor pulmonale: Secondary | ICD-10-CM

## 2020-02-28 DIAGNOSIS — I82491 Acute embolism and thrombosis of other specified deep vein of right lower extremity: Secondary | ICD-10-CM

## 2020-03-01 ENCOUNTER — Encounter: Payer: Self-pay | Admitting: Internal Medicine

## 2020-03-01 DIAGNOSIS — M79642 Pain in left hand: Secondary | ICD-10-CM | POA: Insufficient documentation

## 2020-03-01 DIAGNOSIS — M940 Chondrocostal junction syndrome [Tietze]: Secondary | ICD-10-CM | POA: Insufficient documentation

## 2020-03-01 NOTE — Assessment & Plan Note (Signed)
Lab Results  Component Value Date   LDLCALC 85 06/20/2019   Stable, pt to continue current statin  - diet

## 2020-03-01 NOTE — Assessment & Plan Note (Signed)
Mild, for mobic prn,  to f/u any worsening symptoms or concerns  

## 2020-03-01 NOTE — Assessment & Plan Note (Signed)
Ok for limited phentermine 37.5 mg  And increased activity, decreaed calories

## 2020-03-01 NOTE — Assessment & Plan Note (Signed)
Lab Results  Component Value Date   HGBA1C 6.3 06/20/2019   Stable, pt to continue current medical treatment  - diet

## 2020-03-01 NOTE — Assessment & Plan Note (Signed)
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

## 2020-05-17 ENCOUNTER — Other Ambulatory Visit: Payer: 59

## 2020-05-21 ENCOUNTER — Telehealth: Payer: Self-pay | Admitting: Internal Medicine

## 2020-05-24 ENCOUNTER — Ambulatory Visit (INDEPENDENT_AMBULATORY_CARE_PROVIDER_SITE_OTHER): Payer: 59 | Admitting: Internal Medicine

## 2020-05-24 ENCOUNTER — Other Ambulatory Visit: Payer: Self-pay

## 2020-05-24 ENCOUNTER — Encounter: Payer: Self-pay | Admitting: Internal Medicine

## 2020-05-24 VITALS — BP 130/78 | HR 54 | Temp 98.4°F | Ht 67.0 in | Wt 260.0 lb

## 2020-05-24 DIAGNOSIS — E785 Hyperlipidemia, unspecified: Secondary | ICD-10-CM | POA: Diagnosis not present

## 2020-05-24 DIAGNOSIS — Z0001 Encounter for general adult medical examination with abnormal findings: Secondary | ICD-10-CM | POA: Diagnosis not present

## 2020-05-24 DIAGNOSIS — M79674 Pain in right toe(s): Secondary | ICD-10-CM | POA: Diagnosis not present

## 2020-05-24 DIAGNOSIS — R739 Hyperglycemia, unspecified: Secondary | ICD-10-CM | POA: Diagnosis not present

## 2020-05-24 DIAGNOSIS — E559 Vitamin D deficiency, unspecified: Secondary | ICD-10-CM | POA: Diagnosis not present

## 2020-05-24 DIAGNOSIS — J309 Allergic rhinitis, unspecified: Secondary | ICD-10-CM | POA: Diagnosis not present

## 2020-05-24 DIAGNOSIS — E538 Deficiency of other specified B group vitamins: Secondary | ICD-10-CM | POA: Diagnosis not present

## 2020-05-24 DIAGNOSIS — Z1211 Encounter for screening for malignant neoplasm of colon: Secondary | ICD-10-CM

## 2020-05-24 DIAGNOSIS — I10 Essential (primary) hypertension: Secondary | ICD-10-CM

## 2020-05-24 LAB — URINALYSIS, ROUTINE W REFLEX MICROSCOPIC
Bilirubin Urine: NEGATIVE
Hgb urine dipstick: NEGATIVE
Ketones, ur: NEGATIVE
Leukocytes,Ua: NEGATIVE
Nitrite: NEGATIVE
RBC / HPF: NONE SEEN (ref 0–?)
Specific Gravity, Urine: 1.025 (ref 1.000–1.030)
Total Protein, Urine: NEGATIVE
Urine Glucose: NEGATIVE
Urobilinogen, UA: 0.2 (ref 0.0–1.0)
WBC, UA: NONE SEEN (ref 0–?)
pH: 6.5 (ref 5.0–8.0)

## 2020-05-24 LAB — HEPATIC FUNCTION PANEL
ALT: 11 U/L (ref 0–35)
AST: 15 U/L (ref 0–37)
Albumin: 3.9 g/dL (ref 3.5–5.2)
Alkaline Phosphatase: 57 U/L (ref 39–117)
Bilirubin, Direct: 0.1 mg/dL (ref 0.0–0.3)
Total Bilirubin: 0.3 mg/dL (ref 0.2–1.2)
Total Protein: 7.1 g/dL (ref 6.0–8.3)

## 2020-05-24 LAB — CBC WITH DIFFERENTIAL/PLATELET
Basophils Absolute: 0 10*3/uL (ref 0.0–0.1)
Basophils Relative: 0.5 % (ref 0.0–3.0)
Eosinophils Absolute: 0.2 10*3/uL (ref 0.0–0.7)
Eosinophils Relative: 5.2 % — ABNORMAL HIGH (ref 0.0–5.0)
HCT: 34.7 % — ABNORMAL LOW (ref 36.0–46.0)
Hemoglobin: 11.4 g/dL — ABNORMAL LOW (ref 12.0–15.0)
Lymphocytes Relative: 51.6 % — ABNORMAL HIGH (ref 12.0–46.0)
Lymphs Abs: 2 10*3/uL (ref 0.7–4.0)
MCHC: 33 g/dL (ref 30.0–36.0)
MCV: 83.7 fl (ref 78.0–100.0)
Monocytes Absolute: 0.3 10*3/uL (ref 0.1–1.0)
Monocytes Relative: 6.8 % (ref 3.0–12.0)
Neutro Abs: 1.4 10*3/uL (ref 1.4–7.7)
Neutrophils Relative %: 35.9 % — ABNORMAL LOW (ref 43.0–77.0)
Platelets: 185 10*3/uL (ref 150.0–400.0)
RBC: 4.14 Mil/uL (ref 3.87–5.11)
RDW: 15.2 % (ref 11.5–15.5)
WBC: 4 10*3/uL (ref 4.0–10.5)

## 2020-05-24 LAB — VITAMIN D 25 HYDROXY (VIT D DEFICIENCY, FRACTURES): VITD: 9.78 ng/mL — ABNORMAL LOW (ref 30.00–100.00)

## 2020-05-24 LAB — LIPID PANEL
Cholesterol: 142 mg/dL (ref 0–200)
HDL: 35.9 mg/dL — ABNORMAL LOW (ref >39.00)
LDL Cholesterol: 78 mg/dL (ref 0–99)
NonHDL: 106.03
Total CHOL/HDL Ratio: 4
Triglycerides: 138 mg/dL (ref 0.0–149.0)
VLDL: 27.6 mg/dL (ref 0.0–40.0)

## 2020-05-24 LAB — HEMOGLOBIN A1C: Hgb A1c MFr Bld: 6.5 % (ref 4.6–6.5)

## 2020-05-24 LAB — BASIC METABOLIC PANEL
BUN: 12 mg/dL (ref 6–23)
CO2: 28 mEq/L (ref 19–32)
Calcium: 9.1 mg/dL (ref 8.4–10.5)
Chloride: 106 mEq/L (ref 96–112)
Creatinine, Ser: 0.61 mg/dL (ref 0.40–1.20)
GFR: 105.02 mL/min (ref >60.00)
Glucose, Bld: 81 mg/dL (ref 70–99)
Potassium: 3.9 mEq/L (ref 3.5–5.1)
Sodium: 140 mEq/L (ref 135–145)

## 2020-05-24 LAB — TSH: TSH: 2.48 u[IU]/mL (ref 0.35–4.50)

## 2020-05-24 LAB — VITAMIN B12: Vitamin B-12: 201 pg/mL — ABNORMAL LOW (ref 211–911)

## 2020-05-24 NOTE — Progress Notes (Signed)
Patient ID: Hannah Thompson, female   DOB: 16-Jun-1970, 50 y.o.   MRN: [id number]         Chief Complaint:: wellness exam and Follow-up (3 month)  with allergies, and right toe pain, b12 deficiency, hld, hyperglycemia       HPI:  Hannah Thompson is a 50 y.o. female here for wellness exam; plans to call for pap, and is considering having the covid booster.  O/w up to date with preventive referral and immunizations                        Also Does have several wks ongoing nasal allergy symptoms with clearish congestion, itch and sneezing, without fever, pain, ST, cough, swelling or wheezing.  Needs referral to allergist as symtpoms uncontrolled depiste otc meds.  Lost 15 lbs with much better lower calorie diet than previous.  Also has 2 toes on right foot with swelling and ongoing pain for > 3 wks, asks for podaitry referral, no recent trauma she can recall.  Pain is now constant, moderate, without radiation, sharp, worse to walk, better to not do this.    Not taking B12  Pt denies chest pain, increased sob or doe, wheezing, orthopnea, PND, increased LE swelling, palpitations, dizziness or syncope.   Pt denies polydipsia, polyuria, or new focal neuro s/s.  No other new compalints    Wt Readings from Last 3 Encounters:  05/24/20 260 lb (117.9 kg)  02/25/20 275 lb (124.7 kg)  02/21/20 270 lb (122.5 kg)   BP Readings from Last 3 Encounters:  05/24/20 130/78  02/25/20 136/84  02/21/20 (!) 141/78   Immunization History  Administered Date(s) Administered  . Influenza,inj,Quad PF,6+ Mos 10/18/2016, 11/07/2019  . PFIZER(Purple Top)SARS-COV-2 Vaccination 04/03/2019, 05/05/2019  . Tdap 12/19/2013   Health Maintenance Due  Topic Date Due  . PAP SMEAR-Modifier  10/02/2015  . COLONOSCOPY (Pts 45-31yrs Insurance coverage will need to be confirmed)  Never done      Past Medical History:  Diagnosis Date  . ALLERGIC RHINITIS 03/17/2009  . Anemia   . ASTHMA 03/17/2009  . DVT (deep venous thrombosis) (HCC)    . ECZEMA 03/17/2009  . Headache(784.0) 06/21/2009   recurrent  . HYPERLIPIDEMIA 03/17/2009  . KELOID 06/21/2009  . Pulmonary emboli (HCC)   . VITAMIN B12 DEFICIENCY 03/18/2009   Past Surgical History:  Procedure Laterality Date  . KELOID EXCISION  mid 1990's   s/p keloid removal-laser     reports that she quit smoking about 12 years ago. Her smoking use included cigarettes. She started smoking about 16 years ago. She has a 1.00 pack-year smoking history. She has never used smokeless tobacco. She reports current alcohol use. She reports that she does not use drugs. family history includes Alcohol abuse in an other family member; Allergies in her sister; Cancer in her father, paternal aunt, and another family member; Deep vein thrombosis in her sister; Diabetes in her mother and another family member; Heart disease in an other family member; Hypertension in her father and mother. Allergies  Allergen Reactions  . Oxaprozin     angioedema  . Sulfonamide Derivatives     REACTION: hives  . Fish Allergy Swelling    Pollock  . Avocado   . Orange Juice [Orange Oil] Hives    Orange juice  . Tomato Hives    tomatoes  . Zyrtec [Cetirizine]   . Latex Rash  . Nsaids Rash   Current Outpatient Medications  on File Prior to Visit  Medication Sig Dispense Refill  . acetaminophen (TYLENOL) 325 MG tablet Take 2 tablets (650 mg total) by mouth every 6 (six) hours as needed for mild pain, moderate pain or headache (or Fever >/= 101).    . dabigatran (PRADAXA) 150 MG CAPS capsule TAKE 1 CAPSULE BY MOUTH EVERY 12 HOURS 60 capsule 0  . meloxicam (MOBIC) 15 MG tablet Take 1 tablet (15 mg total) by mouth daily as needed for pain. 90 tablet 1  . phentermine 37.5 MG capsule Take 1 capsule (37.5 mg total) by mouth every morning. 30 capsule 2   No current facility-administered medications on file prior to visit.        ROS:  All others reviewed and negative.  Objective        PE:  BP 130/78 (BP Location:  Right Arm, Patient Position: Sitting, Cuff Size: Large)   Pulse (!) 54   Temp 98.4 F (36.9 C) (Oral)   Ht 5\' 7"  (1.702 m)   Wt 260 lb (117.9 kg)   LMP 05/17/2020   SpO2 98%   BMI 40.72 kg/m                 Constitutional: Pt appears in NAD               HENT: Head: NCAT.                Right Ear: External ear normal.                 Left Ear: External ear normal.                Eyes: . Pupils are equal, round, and reactive to light. Conjunctivae and EOM are normal               Nose: without d/c or deformity               Neck: Neck supple. Gross normal ROM               Cardiovascular: Normal rate and regular rhythm.                 Pulmonary/Chest: Effort normal and breath sounds without rales or wheezing.                Abd:  Soft, NT, ND, + BS, no organomegaly               Neurological: Pt is alert. At baseline orientation, motor grossly intact               Skin: Skin is warm. No rashes, no other new lesions, LE edema - none; right foot 2nd and third toes mild swelling, tender without erythema                Psychiatric: Pt behavior is normal without agitation   Micro: none  Cardiac tracings I have personally interpreted today:  none  Pertinent Radiological findings (summarize): none   Lab Results  Component Value Date   WBC 4.0 05/24/2020   HGB 11.4 (L) 05/24/2020   HCT 34.7 (L) 05/24/2020   PLT 185.0 05/24/2020   GLUCOSE 81 05/24/2020   CHOL 142 05/24/2020   TRIG 138.0 05/24/2020   HDL 35.90 (L) 05/24/2020   LDLCALC 78 05/24/2020   ALT 11 05/24/2020   AST 15 05/24/2020   NA 140 05/24/2020   K 3.9 05/24/2020   CL 106 05/24/2020  CREATININE 0.61 05/24/2020   BUN 12 05/24/2020   CO2 28 05/24/2020   TSH 2.48 05/24/2020   INR 1.01 12/09/2017   HGBA1C 6.5 05/24/2020   Assessment/Plan:  Hannah Thompson is a 50 y.o. Black or African American [2] female with  has a past medical history of ALLERGIC RHINITIS (03/17/2009), Anemia, ASTHMA (03/17/2009), DVT (deep  venous thrombosis) (HCC), ECZEMA (03/17/2009), Headache(784.0) (06/21/2009), HYPERLIPIDEMIA (03/17/2009), KELOID (06/21/2009), Pulmonary emboli (HCC), and VITAMIN B12 DEFICIENCY (03/18/2009).  B12 deficiency Lab Results  Component Value Date   VITAMINB12 201 (L) 05/24/2020   Low , to start oral replacement - b12 1000 mcg qd   Encounter for well adult exam with abnormal findings Age and sex appropriate education and counseling updated with regular exercise and diet Referrals for preventative services - pt to call for pap with GYN Immunizations addressed - encouraged for covid booster Smoking counseling  - none needed Evidence for depression or other mood disorder - none significant Most recent labs reviewed. I have personally reviewed and have noted: 1) the patient's medical and social history 2) The patient's current medications and supplements 3) The patient's height, weight, and BMI have been recorded in the chart   Hyperlipidemia Lab Results  Component Value Date   LDLCALC 78 05/24/2020   Stable, pt to continue current low chol diet, decliens statin for now   Allergic rhinitis With at least mod symptoms, pt requests allergy referraal  Hypertension BP Readings from Last 3 Encounters:  05/24/20 130/78  02/25/20 136/84  02/21/20 (!) 141/78   Stable, pt to continue medical treatment  - wt control, low salt diet   Hyperglycemia Lab Results  Component Value Date   HGBA1C 6.5 05/24/2020   Stable, pt to continue current medical treatment  - diet   Vitamin D deficiency Last vitamin D Lab Results  Component Value Date   VD25OH 9.78 (L) 05/24/2020   Low, to start oral replacement   Toe pain, right Ok for referral podiatry, declines xray for now  Followup: Return in about 6 months (around 11/24/2020).  Oliver Barre, MD 05/31/2020 1:14 PM [facility] [place] [facility] - [facility]

## 2020-05-24 NOTE — Patient Instructions (Signed)
Please continue all other medications as before, and refills have been done if requested.  Please have the pharmacy call with any other refills you may need.  Please continue your efforts at being more active, low cholesterol diet, and weight control.  You are otherwise up to date with prevention measures today.  Please keep your appointments with your specialists as you may have planned  You will be contacted regarding the referral for: colonoscopy, allergy, and podiatry  Please remember to call for your yearly GYN, and consider the covid booster shot  Please go to the LAB at the blood drawing area for the tests to be done  You will be contacted by phone if any changes need to be made immediately.  Otherwise, you will receive a letter about your results with an explanation, but please check with MyChart first.  Please remember to sign up for MyChart if you have not done so, as this will be important to you in the future with finding out test results, communicating by private email, and scheduling acute appointments online when needed.  Please make an Appointment to return in 6 months, or sooner if needed

## 2020-05-25 ENCOUNTER — Encounter: Payer: Self-pay | Admitting: Internal Medicine

## 2020-05-25 DIAGNOSIS — E559 Vitamin D deficiency, unspecified: Secondary | ICD-10-CM | POA: Insufficient documentation

## 2020-05-31 ENCOUNTER — Encounter: Payer: Self-pay | Admitting: Internal Medicine

## 2020-05-31 DIAGNOSIS — M79674 Pain in right toe(s): Secondary | ICD-10-CM | POA: Insufficient documentation

## 2020-05-31 NOTE — Assessment & Plan Note (Signed)
Lab Results  Component Value Date   HGBA1C 6.5 05/24/2020   Stable, pt to continue current medical treatment  - diet

## 2020-05-31 NOTE — Assessment & Plan Note (Signed)
Lab Results  Component Value Date   VITAMINB12 201 (L) 05/24/2020   Low , to start oral replacement - b12 1000 mcg qd

## 2020-05-31 NOTE — Assessment & Plan Note (Signed)
Lab Results  Component Value Date   LDLCALC 78 05/24/2020   Stable, pt to continue current low chol diet, decliens statin for now

## 2020-05-31 NOTE — Assessment & Plan Note (Signed)
Age and sex appropriate education and counseling updated with regular exercise and diet Referrals for preventative services - pt to call for pap with GYN Immunizations addressed - encouraged for covid booster Smoking counseling  - none needed Evidence for depression or other mood disorder - none significant Most recent labs reviewed. I have personally reviewed and have noted: 1) the patient's medical and social history 2) The patient's current medications and supplements 3) The patient's height, weight, and BMI have been recorded in the chart

## 2020-05-31 NOTE — Assessment & Plan Note (Signed)
Last vitamin D Lab Results  Component Value Date   VD25OH 9.78 (L) 05/24/2020   Low, to start oral replacement

## 2020-05-31 NOTE — Assessment & Plan Note (Signed)
BP Readings from Last 3 Encounters:  05/24/20 130/78  02/25/20 136/84  02/21/20 (!) 141/78   Stable, pt to continue medical treatment  - wt control, low salt diet

## 2020-05-31 NOTE — Assessment & Plan Note (Signed)
Ok for referral podiatry, declines xray for now

## 2020-05-31 NOTE — Assessment & Plan Note (Signed)
With at least mod symptoms, pt requests allergy referraal

## 2020-06-01 ENCOUNTER — Other Ambulatory Visit: Payer: Self-pay | Admitting: Hematology & Oncology

## 2020-06-01 DIAGNOSIS — I2692 Saddle embolus of pulmonary artery without acute cor pulmonale: Secondary | ICD-10-CM

## 2020-06-01 DIAGNOSIS — I82491 Acute embolism and thrombosis of other specified deep vein of right lower extremity: Secondary | ICD-10-CM

## 2020-06-09 ENCOUNTER — Other Ambulatory Visit: Payer: Self-pay

## 2020-06-09 ENCOUNTER — Ambulatory Visit (INDEPENDENT_AMBULATORY_CARE_PROVIDER_SITE_OTHER): Payer: 59 | Admitting: Podiatry

## 2020-06-09 ENCOUNTER — Ambulatory Visit (INDEPENDENT_AMBULATORY_CARE_PROVIDER_SITE_OTHER): Payer: 59

## 2020-06-09 DIAGNOSIS — G5761 Lesion of plantar nerve, right lower limb: Secondary | ICD-10-CM

## 2020-06-09 DIAGNOSIS — M2141 Flat foot [pes planus] (acquired), right foot: Secondary | ICD-10-CM | POA: Diagnosis not present

## 2020-06-09 NOTE — Progress Notes (Signed)
   HPI: 50 y.o. female presenting today as a new patient for evaluation of separation between the second and third digit to the right foot that slowly is developed over the past few years.  Patient states that it is only minimally symptomatic.  She denies a history of injury.  She has not done anything for treatment other than trying to tape the toes together.  Past Medical History:  Diagnosis Date  . ALLERGIC RHINITIS 03/17/2009  . Anemia   . ASTHMA 03/17/2009  . DVT (deep venous thrombosis) (HCC)   . ECZEMA 03/17/2009  . Headache(784.0) 06/21/2009   recurrent  . HYPERLIPIDEMIA 03/17/2009  . KELOID 06/21/2009  . Pulmonary emboli (HCC)   . VITAMIN B12 DEFICIENCY 03/18/2009     Physical Exam: General: The patient is alert and oriented x3 in no acute distress.  Dermatology: Skin is warm, dry and supple bilateral lower extremities. Negative for open lesions or macerations.  Vascular: Palpable pedal pulses bilaterally. No edema or erythema noted. Capillary refill within normal limits.  Neurological: Epicritic and protective threshold grossly intact bilaterally.   Musculoskeletal Exam: Range of motion within normal limits to all pedal and ankle joints bilateral. Muscle strength 5/5 in all groups bilateral.  Splay toe deformity noted between the second and third digits that correlate with radiographic exam.  There is some pain on palpation between the second and third digits very minimally in the intermetatarsal area  Radiographic Exam:  Normal osseous mineralization. Joint spaces preserved. No fracture/dislocation/boney destruction.  Splay toe deformity between the second and third digits at the level of the MTPJ.    Assessment: 1.  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 Patient evaluated. X-Rays reviewed.  2.  The patient symptoms are very minimal.  Recommend conservative treatment for the moment.  Recommend good supportive shoes with arch supports that  alleviate pressure from the forefoot 3.  Return to clinic as needed      Felecia Shelling, DPM [facility]  Dr. Felecia Shelling, DPM    [address] [place], [place] [address]                Office [phone]  Fax [phone]

## 2020-06-09 NOTE — Patient Instructions (Signed)
[employer]ionic Womens shoes and sandals

## 2020-07-12 ENCOUNTER — Telehealth: Payer: Self-pay

## 2020-07-12 ENCOUNTER — Inpatient Hospital Stay (HOSPITAL_BASED_OUTPATIENT_CLINIC_OR_DEPARTMENT_OTHER): Payer: 59 | Admitting: Family

## 2020-07-12 ENCOUNTER — Inpatient Hospital Stay: Payer: 59 | Attending: Family

## 2020-07-12 ENCOUNTER — Other Ambulatory Visit: Payer: Self-pay

## 2020-07-12 ENCOUNTER — Encounter: Payer: Self-pay | Admitting: Family

## 2020-07-12 VITALS — BP 128/94 | HR 53 | Temp 98.0°F | Resp 19 | Ht 67.0 in | Wt 256.4 lb

## 2020-07-12 DIAGNOSIS — D696 Thrombocytopenia, unspecified: Secondary | ICD-10-CM | POA: Diagnosis not present

## 2020-07-12 DIAGNOSIS — D72819 Decreased white blood cell count, unspecified: Secondary | ICD-10-CM | POA: Insufficient documentation

## 2020-07-12 DIAGNOSIS — Z882 Allergy status to sulfonamides status: Secondary | ICD-10-CM | POA: Diagnosis not present

## 2020-07-12 DIAGNOSIS — I2601 Septic pulmonary embolism with acute cor pulmonale: Secondary | ICD-10-CM | POA: Diagnosis not present

## 2020-07-12 DIAGNOSIS — I2699 Other pulmonary embolism without acute cor pulmonale: Secondary | ICD-10-CM | POA: Insufficient documentation

## 2020-07-12 DIAGNOSIS — Z886 Allergy status to analgesic agent status: Secondary | ICD-10-CM | POA: Diagnosis not present

## 2020-07-12 DIAGNOSIS — I82403 Acute embolism and thrombosis of unspecified deep veins of lower extremity, bilateral: Secondary | ICD-10-CM | POA: Diagnosis present

## 2020-07-12 DIAGNOSIS — Z7901 Long term (current) use of anticoagulants: Secondary | ICD-10-CM | POA: Diagnosis not present

## 2020-07-12 DIAGNOSIS — I82491 Acute embolism and thrombosis of other specified deep vein of right lower extremity: Secondary | ICD-10-CM

## 2020-07-12 LAB — CBC WITH DIFFERENTIAL (CANCER CENTER ONLY)
Abs Immature Granulocytes: 0.01 10*3/uL (ref 0.00–0.07)
Basophils Absolute: 0 10*3/uL (ref 0.0–0.1)
Basophils Relative: 1 %
Eosinophils Absolute: 0.3 10*3/uL (ref 0.0–0.5)
Eosinophils Relative: 8 %
HCT: 35.9 % — ABNORMAL LOW (ref 36.0–46.0)
Hemoglobin: 11.7 g/dL — ABNORMAL LOW (ref 12.0–15.0)
Immature Granulocytes: 0 %
Lymphocytes Relative: 53 %
Lymphs Abs: 2.2 10*3/uL (ref 0.7–4.0)
MCH: 27.9 pg (ref 26.0–34.0)
MCHC: 32.6 g/dL (ref 30.0–36.0)
MCV: 85.5 fL (ref 80.0–100.0)
Monocytes Absolute: 0.3 10*3/uL (ref 0.1–1.0)
Monocytes Relative: 7 %
Neutro Abs: 1.2 10*3/uL — ABNORMAL LOW (ref 1.7–7.7)
Neutrophils Relative %: 31 %
Platelet Count: 183 10*3/uL (ref 150–400)
RBC: 4.2 MIL/uL (ref 3.87–5.11)
RDW: 14.4 % (ref 11.5–15.5)
WBC Count: 4.1 10*3/uL (ref 4.0–10.5)
nRBC: 0 % (ref 0.0–0.2)

## 2020-07-12 LAB — CMP (CANCER CENTER ONLY)
ALT: 9 U/L (ref 0–44)
AST: 13 U/L — ABNORMAL LOW (ref 15–41)
Albumin: 4.1 g/dL (ref 3.5–5.0)
Alkaline Phosphatase: 64 U/L (ref 38–126)
Anion gap: 6 (ref 5–15)
BUN: 14 mg/dL (ref 6–20)
CO2: 28 mmol/L (ref 22–32)
Calcium: 9.6 mg/dL (ref 8.9–10.3)
Chloride: 104 mmol/L (ref 98–111)
Creatinine: 0.73 mg/dL (ref 0.44–1.00)
GFR, Estimated: >60 mL/min (ref >60)
Glucose, Bld: 88 mg/dL (ref 70–99)
Potassium: 3.7 mmol/L (ref 3.5–5.1)
Sodium: 138 mmol/L (ref 135–145)
Total Bilirubin: 0.3 mg/dL (ref 0.3–1.2)
Total Protein: 6.9 g/dL (ref 6.5–8.1)

## 2020-07-12 NOTE — Progress Notes (Signed)
Hematology and Oncology Follow Up Visit  Hannah Thompson [id number] 09/18/1970 50 y.o. 07/12/2020   Principle Diagnosis:  Recurrent PE with right heart stain - Idiopathic Bilateral lower extremity DVT Ethnic associated leukopenia Transient thrombocytopenia   Current Therapy:        Pradaxa 110 mg po BID - long term maintanence    Interim History:  Hannah Thompson is here today for follow-up. She is doing well and has no complaints at this time.  She continues to tolerate the Pradaxa nicely. No episodes of bleeding. No bruising or petechiae.  No fever, chills, n/v, cough, rash, dizziness, SOB, chest pain, palpitations, abdominal pain or changes in bowel or bladder habits.  No swelling, tenderness, numbness or tingling in her extremities.  No falls or syncope.  She has maintained a good appetite and is staying well hydrated. Her weight is stable at 256 lbs.   She has been walking some for exercise.   ECOG Performance Status: 0 - Asymptomatic  Medications:  Allergies as of 07/12/2020       Reactions   Oxaprozin    angioedema   Sulfonamide Derivatives    REACTION: hives   Fish Allergy Swelling   Pollock   Avocado    Zyrtec [cetirizine]    Latex Rash   Nsaids Rash   Orange Juice [orange Oil] Hives   Orange juice   Tomato Hives   tomatoes        Medication List        Accurate as of July 12, 2020  9:32 AM. If you have any questions, ask your nurse or doctor.          acetaminophen 325 MG tablet Commonly known as: TYLENOL Take 2 tablets (650 mg total) by mouth every 6 (six) hours as needed for mild pain, moderate pain or headache (or Fever >/= 101).   dabigatran 150 MG Caps capsule Commonly known as: Pradaxa TAKE 1 CAPSULE BY MOUTH EVERY 12 HOURS   meloxicam 15 MG tablet Commonly known as: Mobic Take 1 tablet (15 mg total) by mouth daily as needed for pain.   phentermine 37.5 MG capsule Take 1 capsule (37.5 mg total) by mouth every morning.         Allergies:  Allergies  Allergen Reactions   Oxaprozin     angioedema   Sulfonamide Derivatives     REACTION: hives   Fish Allergy Swelling    Pollock   Avocado    Zyrtec [Cetirizine]    Latex Rash   Nsaids Rash   Orange Juice [Orange Oil] Hives    Orange juice   Tomato Hives    tomatoes    Past Medical History, Surgical history, Social history, and Family History were reviewed and updated.  Review of Systems: All other 10 point review of systems is negative.   Physical Exam:  vitals were not taken for this visit.   Wt Readings from Last 3 Encounters:  05/24/20 260 lb (117.9 kg)  02/25/20 275 lb (124.7 kg)  02/21/20 270 lb (122.5 kg)    Ocular: Sclerae unicteric, pupils equal, round and reactive to light Ear-nose-throat: Oropharynx clear, dentition fair Lymphatic: No cervical, supraclavicular or axillary adenopathy Lungs no rales or rhonchi, good excursion bilaterally Heart regular rate and rhythm, no murmur appreciated Abd soft, nontender, positive bowel sounds MSK no focal spinal tenderness, no joint edema Neuro: non-focal, well-oriented, appropriate affect Breasts: Deferred   Lab Results  Component Value Date   WBC 4.1 07/12/2020  HGB 11.7 (L) 07/12/2020   HCT 35.9 (L) 07/12/2020   MCV 85.5 07/12/2020   PLT 183 07/12/2020   Lab Results  Component Value Date   FERRITIN 242 08/16/2015   IRON 76 08/16/2015   TIBC 251 08/16/2015   UIBC 175 08/16/2015   IRONPCTSAT 30 08/16/2015   Lab Results  Component Value Date   RBC 4.20 07/12/2020   No results found for: KPAFRELGTCHN, LAMBDASER, KAPLAMBRATIO No results found for: Loel Lofty, IGMSERUM No results found for: Marda Stalker, SPEI   Chemistry      Component Value Date/Time   NA 140 05/24/2020 0941   NA 139 10/23/2016 1306   NA 140 09/25/2016 1145   K 3.9 05/24/2020 0941   K 3.8 10/23/2016 1306   K 4.1 09/25/2016 1145   CL 106  05/24/2020 0941   CL 107 10/23/2016 1306   CO2 28 05/24/2020 0941   CO2 26 10/23/2016 1306   CO2 28 09/25/2016 1145   BUN 12 05/24/2020 0941   BUN 8 10/23/2016 1306   BUN 9.5 09/25/2016 1145   CREATININE 0.61 05/24/2020 0941   CREATININE 0.68 01/12/2020 0834   CREATININE 0.8 10/23/2016 1306   CREATININE 0.8 09/25/2016 1145      Component Value Date/Time   CALCIUM 9.1 05/24/2020 0941   CALCIUM 8.7 10/23/2016 1306   CALCIUM 9.6 09/25/2016 1145   ALKPHOS 57 05/24/2020 0941   ALKPHOS 67 10/23/2016 1306   ALKPHOS 72 09/25/2016 1145   AST 15 05/24/2020 0941   AST 13 (L) 01/12/2020 0834   AST 17 09/25/2016 1145   ALT 11 05/24/2020 0941   ALT 12 01/12/2020 0834   ALT 26 10/23/2016 1306   ALT 11 09/25/2016 1145   BILITOT 0.3 05/24/2020 0941   BILITOT 0.3 01/12/2020 0834   BILITOT 0.26 09/25/2016 1145       Impression and Plan: Hannah Thompson is a very pleasant 50 yo African American female with significant history of thromboembolic disease with a pulmonary embolism with right heart strain as well as bilateral lower extremity DVT's (while off of Pradaxa for 2 weeks).  She is doing well on Pradaxa and is taking as prescribed. So far there has been no evidence of new recurrence.  Follow-up in 6 months.  She can contact our office with any questions or concerns.   Hannah Gins, NP 7/11/20229:32 AM

## 2020-07-12 NOTE — Telephone Encounter (Signed)
Appts made and printed for pt per los 07/12/20   

## 2020-08-09 ENCOUNTER — Ambulatory Visit (INDEPENDENT_AMBULATORY_CARE_PROVIDER_SITE_OTHER): Payer: 59 | Admitting: Allergy

## 2020-08-09 ENCOUNTER — Other Ambulatory Visit: Payer: Self-pay

## 2020-08-09 ENCOUNTER — Encounter: Payer: Self-pay | Admitting: Allergy

## 2020-08-09 ENCOUNTER — Other Ambulatory Visit: Payer: Self-pay | Admitting: Hematology & Oncology

## 2020-08-09 VITALS — BP 130/82 | HR 98 | Temp 98.7°F | Resp 18 | Ht 66.85 in | Wt 259.0 lb

## 2020-08-09 DIAGNOSIS — I2692 Saddle embolus of pulmonary artery without acute cor pulmonale: Secondary | ICD-10-CM

## 2020-08-09 DIAGNOSIS — H1013 Acute atopic conjunctivitis, bilateral: Secondary | ICD-10-CM | POA: Diagnosis not present

## 2020-08-09 DIAGNOSIS — I82491 Acute embolism and thrombosis of other specified deep vein of right lower extremity: Secondary | ICD-10-CM

## 2020-08-09 DIAGNOSIS — J3089 Other allergic rhinitis: Secondary | ICD-10-CM

## 2020-08-09 DIAGNOSIS — Z889 Allergy status to unspecified drugs, medicaments and biological substances status: Secondary | ICD-10-CM | POA: Diagnosis not present

## 2020-08-09 DIAGNOSIS — T781XXD Other adverse food reactions, not elsewhere classified, subsequent encounter: Secondary | ICD-10-CM

## 2020-08-09 DIAGNOSIS — L2089 Other atopic dermatitis: Secondary | ICD-10-CM

## 2020-08-09 MED ORDER — EPINEPHRINE 0.3 MG/0.3ML IJ SOAJ: 0.3000 mg | INTRAMUSCULAR | 2 refills | Status: DC | PRN

## 2020-08-09 MED ORDER — CROMOLYN SODIUM 4 % OP SOLN: 1.0000 [drp] | Freq: Four times a day (QID) | OPHTHALMIC | 5 refills | Status: DC | PRN

## 2020-08-09 NOTE — Patient Instructions (Addendum)
Today's skin testing showed: Positive to grass, weed pollen, ragweed, trees, mold, dust mites, cat, feathers, cockroach. Borderline positive to soy, hazelnut, pistachio, orange, cantaloupe and watermelon. Results given.  Environmental allergies: Start environmental control measures as below. Use over the counter antihistamines such as Claritin (loratadine), Allegra (fexofenadine), or Xyzal (levocetirizine) daily as needed. May take twice a day during allergy flares. May switch antihistamines every few months. Use Flonase (fluticasone) OR Nasonex nasal spray 1 spray per nostril twice a day as needed for nasal congestion.  Nasal saline spray (i.e., Simply Saline) or nasal saline lavage (i.e., NeilMed) is recommended as needed and prior to medicated nasal sprays. Consider allergy injections for long term control if above medications do not help the symptoms - handout given.   Food: Continue to avoid foods that are bothersome - melons, oranges, coconut, fresh tomatoes, avocado, pollock.  I have prescribed epinephrine injectable and demonstrated proper use. For mild symptoms you can take over the counter antihistamines such as Benadryl and monitor symptoms closely. If symptoms worsen or if you have severe symptoms including breathing issues, throat closure, significant swelling, whole body hives, severe diarrhea and vomiting, lightheadedness then inject epinephrine and seek immediate medical care afterwards. Action plan given.  Discussed that her food triggered oral and throat symptoms are likely caused by oral food allergy syndrome (OFAS). This is caused by cross reactivity of pollen with fresh fruits and vegetables, and nuts. Symptoms are usually localized in the form of itching and burning in mouth and throat. Very rarely it can progress to more severe symptoms. Eating foods in cooked or processed forms usually minimizes symptoms. I recommended avoidance of eating the problem foods, especially during  the peak season(s). A list of common pollens and food cross-reactivities was provided to the patient.   Drugs: Continue to avoid drugs on your allergy list.   Skin: See below for proper skin care.  Follow up in 6 months or sooner if needed.   Reducing Pollen Exposure Pollen seasons: trees (spring), grass (summer) and ragweed/weeds (fall). Keep windows closed in your home and car to lower pollen exposure.  Install air conditioning in the bedroom and throughout the house if possible.  Avoid going out in dry windy days - especially early morning. Pollen counts are highest between 5 - 10 AM and on dry, hot and windy days.  Save outside activities for late afternoon or after a heavy rain, when pollen levels are lower.  Avoid mowing of grass if you have grass pollen allergy. Be aware that pollen can also be transported indoors on people and pets.  Dry your clothes in an automatic dryer rather than hanging them outside where they might collect pollen.  Rinse hair and eyes before bedtime. Control of House Dust Mite Allergen Dust mite allergens are a common trigger of allergy and asthma symptoms. While they can be found throughout the house, these microscopic creatures thrive in warm, humid environments such as bedding, upholstered furniture and carpeting. Because so much time is spent in the bedroom, it is essential to reduce mite levels there.  Encase pillows, mattresses, and box springs in special allergen-proof fabric covers or airtight, zippered plastic covers.  Bedding should be washed weekly in hot water (130 F) and dried in a hot dryer. Allergen-proof covers are available for comforters and pillows that can't be regularly washed.  Wash the allergy-proof covers every few months. Minimize clutter in the bedroom. Keep pets out of the bedroom.  Keep humidity less than 50% by using  a dehumidifier or air conditioning. You can buy a humidity measuring device called a hygrometer to monitor this.   If possible, replace carpets with hardwood, linoleum, or washable area rugs. If that's not possible, vacuum frequently with a vacuum that has a HEPA filter. Remove all upholstered furniture and non-washable window drapes from the bedroom. Remove all non-washable stuffed toys from the bedroom.  Wash stuffed toys weekly. Mold Control Mold and fungi can grow on a variety of surfaces provided certain temperature and moisture conditions exist.  Outdoor molds grow on plants, decaying vegetation and soil. The major outdoor mold, Alternaria and Cladosporium, are found in very high numbers during hot and dry conditions. Generally, a late summer - fall peak is seen for common outdoor fungal spores. Rain will temporarily lower outdoor mold spore count, but counts rise rapidly when the rainy period ends. The most important indoor molds are Aspergillus and Penicillium. Dark, humid and poorly ventilated basements are ideal sites for mold growth. The next most common sites of mold growth are the bathroom and the kitchen. Outdoor (Seasonal) Mold Control Use air conditioning and keep windows closed. Avoid exposure to decaying vegetation. Avoid leaf raking. Avoid grain handling. Consider wearing a face mask if working in moldy areas.  Indoor (Perennial) Mold Control  Maintain humidity below 50%. Get rid of mold growth on hard surfaces with water, detergent and, if necessary, 5% bleach (do not mix with other cleaners). Then dry the area completely. If mold covers an area more than 10 square feet, consider hiring an indoor environmental professional. For clothing, washing with soap and water is best. If moldy items cannot be cleaned and dried, throw them away. Remove sources e.g. contaminated carpets. Repair and seal leaking roofs or pipes. Using dehumidifiers in damp basements may be helpful, but empty the water and clean units regularly to prevent mildew from forming. All rooms, especially basements, bathrooms and  kitchens, require ventilation and cleaning to deter mold and mildew growth. Avoid carpeting on concrete or damp floors, and storing items in damp areas. Pet Allergen Avoidance: Contrary to popular opinion, there are no "hypoallergenic" breeds of dogs or cats. That is because people are not allergic to an animal's hair, but to an allergen found in the animal's saliva, dander (dead skin flakes) or urine. Pet allergy symptoms typically occur within minutes. For some people, symptoms can build up and become most severe 8 to 12 hours after contact with the animal. People with severe allergies can experience reactions in public places if dander has been transported on the pet owners' clothing. Keeping an animal outdoors is only a partial solution, since homes with pets in the yard still have higher concentrations of animal allergens. Before getting a pet, ask your allergist to determine if you are allergic to animals. If your pet is already considered part of your family, try to minimize contact and keep the pet out of the bedroom and other rooms where you spend a great deal of time. As with dust mites, vacuum carpets often or replace carpet with a hardwood floor, tile or linoleum. High-efficiency particulate air (HEPA) cleaners can reduce allergen levels over time. While dander and saliva are the source of cat and dog allergens, urine is the source of allergens from rabbits, hamsters, mice and [place] pigs; so ask a non-allergic family member to clean the animal's cage. If you have a pet allergy, talk to your allergist about the potential for allergy immunotherapy (allergy shots). This strategy can often provide long-term relief.  Cockroach Allergen Avoidance Cockroaches are often found in the homes of densely populated urban areas, schools or commercial buildings, but these creatures can lurk almost anywhere. This does not mean that you have a dirty house or living area. Block all areas where roaches can enter  the home. This includes crevices, wall cracks and windows.  Cockroaches need water to survive, so fix and seal all leaky faucets and pipes. Have an exterminator go through the house when your family and pets are gone to eliminate any remaining roaches. Keep food in lidded containers and put pet food dishes away after your pets are done eating. Vacuum and sweep the floor after meals, and take out garbage and recyclables. Use lidded garbage containers in the kitchen. Wash dishes immediately after use and clean under stoves, refrigerators or toasters where crumbs can accumulate. Wipe off the stove and other kitchen surfaces and cupboards regularly.  Skin care recommendations  Bath time: Always use lukewarm water. AVOID very hot or cold water. Keep bathing time to 5-10 minutes. Do NOT use bubble bath. Use a mild soap and use just enough to wash the dirty areas. Do NOT scrub skin vigorously.  After bathing, pat dry your skin with a towel. Do NOT rub or scrub the skin.  Moisturizers and prescriptions:  ALWAYS apply moisturizers immediately after bathing (within 3 minutes). This helps to lock-in moisture. Use the moisturizer several times a day over the whole body. Good summer moisturizers include: Aveeno, CeraVe, Cetaphil. Good winter moisturizers include: Aquaphor, Vaseline, Cerave, Cetaphil, Eucerin, Vanicream. When using moisturizers along with medications, the moisturizer should be applied about one hour after applying the medication to prevent diluting effect of the medication or moisturize around where you applied the medications. When not using medications, the moisturizer can be continued twice daily as maintenance.  Laundry and clothing: Avoid laundry products with added color or perfumes. Use unscented hypo-allergenic laundry products such as Tide free, Cheer free & gentle, and All free and clear.  If the skin still seems dry or sensitive, you can try double-rinsing the clothes. Avoid  tight or scratchy clothing such as wool. Do not use fabric softeners or dyer sheets.
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
## 2020-08-10 DIAGNOSIS — T781XXD Other adverse food reactions, not elsewhere classified, subsequent encounter: Secondary | ICD-10-CM | POA: Insufficient documentation

## 2020-08-10 DIAGNOSIS — H1013 Acute atopic conjunctivitis, bilateral: Secondary | ICD-10-CM | POA: Insufficient documentation

## 2020-08-10 DIAGNOSIS — L2089 Other atopic dermatitis: Secondary | ICD-10-CM | POA: Insufficient documentation

## 2020-08-10 DIAGNOSIS — Z889 Allergy status to unspecified drugs, medicaments and biological substances status: Secondary | ICD-10-CM | POA: Insufficient documentation

## 2020-08-10 NOTE — Assessment & Plan Note (Signed)
Fresh tomatoes cause facial pruritus/? Tongue swelling. Tolerates processed tomatoes. Coconut causes contact rash and perioral pruritus. Melons/oranges cause perioral pruritus. Avocados caused vomiting/pruritus. Pollock caused nausea, pruritus and eye swelling. Eats other seafood with no issues.  . Today's skin testing showed: Borderline positive to soy, hazelnut, pistachio, orange, cantaloupe and watermelon. . Continue to avoid foods that are bothersome - melons, oranges, coconut, fresh tomatoes, avocado, pollock. . I have prescribed epinephrine injectable and demonstrated proper use. For mild symptoms you can take over the counter antihistamines such as Benadryl and monitor symptoms closely. If symptoms worsen or if you have severe symptoms including breathing issues, throat closure, significant swelling, whole body hives, severe diarrhea and vomiting, lightheadedness then inject epinephrine and seek immediate medical care afterwards. . Action plan given.

## 2020-08-10 NOTE — Assessment & Plan Note (Signed)
Continue to avoid medications on allergy list. 

## 2020-08-10 NOTE — Assessment & Plan Note (Signed)
Perennial rhinoconjunctivitis symptoms for 40+ years with worsening from spring through fall.  Zyrtec caused a rash and swelling.  Skin testing over 20 years ago showed multiple positives per patient report and was on allergy immunotherapy for a few years with good benefit.  Today's skin testing showed: Positive to grass, weed pollen, ragweed, trees, mold, dust mites, cat, feathers, cockroach.  Start environmental control measures as below.  Use over the counter antihistamines such as Claritin (loratadine), Allegra (fexofenadine), or Xyzal (levocetirizine) daily as needed. May take twice a day during allergy flares. May switch antihistamines every few months.  Use Flonase (fluticasone) OR Nasonex nasal spray 1 spray per nostril twice a day as needed for nasal congestion.   Nasal saline spray (i.e., Simply Saline) or nasal saline lavage (i.e., NeilMed) is recommended as needed and prior to medicated nasal sprays.  Consider allergy injections for long term control if above medications do not help the symptoms - handout given.

## 2020-08-10 NOTE — Assessment & Plan Note (Signed)
.   See assessment and plan as above. 

## 2020-08-10 NOTE — Assessment & Plan Note (Signed)
Continue proper skin care. 

## 2020-08-26 ENCOUNTER — Other Ambulatory Visit: Payer: Self-pay | Admitting: Internal Medicine

## 2020-10-01 ENCOUNTER — Encounter: Payer: Self-pay | Admitting: Internal Medicine

## 2020-10-01 ENCOUNTER — Other Ambulatory Visit: Payer: Self-pay

## 2020-10-01 ENCOUNTER — Telehealth (INDEPENDENT_AMBULATORY_CARE_PROVIDER_SITE_OTHER): Payer: 59 | Admitting: Internal Medicine

## 2020-10-01 ENCOUNTER — Ambulatory Visit (INDEPENDENT_AMBULATORY_CARE_PROVIDER_SITE_OTHER)
Admission: RE | Admit: 2020-10-01 | Discharge: 2020-10-01 | Disposition: A | Discharge status: Home / Self Care | Payer: 59 | Source: Ambulatory Visit | Attending: Internal Medicine | Admitting: Internal Medicine

## 2020-10-01 DIAGNOSIS — M25562 Pain in left knee: Secondary | ICD-10-CM | POA: Insufficient documentation

## 2020-10-01 MED ORDER — TRAMADOL HCL 50 MG PO TABS: 50.0000 mg | ORAL_TABLET | Freq: Four times a day (QID) | ORAL | 0 refills | Status: DC | PRN

## 2020-10-01 NOTE — Progress Notes (Signed)
Patient ID: Hannah Thompson, female   DOB: 10-15-1970, 50 y.o.   MRN: [id number]  Virtual Visit via Video Note  I connected with Hannah Thompson on 10/01/20 at  3:40 PM EDT by a video enabled telemedicine application and verified that I am speaking with the correct person using two identifiers.  Location of all participants today Patient: at home Provider: at office   I discussed the limitations of evaluation and management by telemedicine and the availability of in person appointments. The patient expressed understanding and agreed to proceed.  History of Present Illness: Here to f/u with c/o fall x 2 days ago down a last few steps and hit with all ehr wt on the left knee to hard floor, now with severe pain and swelling, limps to walk though has been able to get by at work.  Has large swelling and decreased ROM.  No fever, hx of gout.  Asks for referral to dr Smith/sport med.  Pt denies chest pain, increased sob or doe, wheezing, orthopnea, PND, increased LE swelling, palpitations, dizziness or syncope.   Pt denies polydipsia, polyuria or new focal neuro s/s.   Past Medical History:  Diagnosis Date   ALLERGIC RHINITIS 03/17/2009   Anemia    ASTHMA 03/17/2009   DVT (deep venous thrombosis) (HCC)    ECZEMA 03/17/2009   Headache(784.0) 06/21/2009   recurrent   HYPERLIPIDEMIA 03/17/2009   KELOID 06/21/2009   Pulmonary emboli (HCC)    VITAMIN B12 DEFICIENCY 03/18/2009   Past Surgical History:  Procedure Laterality Date   KELOID EXCISION  mid 1990's   s/p keloid removal-laser     reports that she quit smoking about 12 years ago. Her smoking use included cigarettes. She started smoking about 16 years ago. She has a 1.00 pack-year smoking history. She has never been exposed to tobacco smoke. She has never used smokeless tobacco. She reports current alcohol use. She reports that she does not use drugs. family history includes Alcohol abuse in an other family member; Allergies in her sister; Angioedema  in her sister; Cancer in her father, paternal aunt, and another family member; Deep vein thrombosis in her sister; Diabetes in her mother and another family member; Eczema in her sister; Heart disease in an other family member; Hypertension in her father and mother. Allergies  Allergen Reactions   Avocado Nausea And Vomiting    Projectile N/V   Fish Allergy Anaphylaxis and Swelling    Pollock.Swelling of lips, throat and face.   Oxaprozin     angioedema   Sulfonamide Derivatives     REACTION: hives   Latex Rash   Nsaids Rash   Orange Juice [Orange Oil] Hives    Orange juice   Tomato Hives    tomatoes   Zyrtec [Cetirizine] Hives and Rash   Current Outpatient Medications on File Prior to Visit  Medication Sig Dispense Refill   acetaminophen (TYLENOL) 325 MG tablet Take 2 tablets (650 mg total) by mouth every 6 (six) hours as needed for mild pain, moderate pain or headache (or Fever >/= 101).     cromolyn (OPTICROM) 4 % ophthalmic solution Place 1 drop into both eyes 4 (four) times daily as needed (itchy/watery eyes). 10 mL 5   dabigatran (PRADAXA) 150 MG CAPS capsule TAKE 1 CAPSULE BY MOUTH EVERY 12 HOURS 60 capsule 0   EPINEPHrine 0.3 mg/0.3 mL IJ SOAJ injection Inject 0.3 mg into the muscle as needed for anaphylaxis. 1 each 2   meloxicam (MOBIC) 15  MG tablet TAKE 1 TABLET BY MOUTH ONCE DAILY AS NEEDED FOR PAIN 90 tablet 0   phentermine 37.5 MG capsule Take 1 capsule (37.5 mg total) by mouth every morning. (Patient not taking: Reported on 08/09/2020) 30 capsule 2   No current facility-administered medications on file prior to visit.    Observations/Objective: Alert, NAD, appropriate mood and affect, resps normal, cn 2-12 intact, moves all 4s, no visible rash or swelling Lab Results  Component Value Date   WBC 4.1 07/12/2020   HGB 11.7 (L) 07/12/2020   HCT 35.9 (L) 07/12/2020   PLT 183 07/12/2020   GLUCOSE 88 07/12/2020   CHOL 142 05/24/2020   TRIG 138.0 05/24/2020   HDL 35.90  (L) 05/24/2020   LDLCALC 78 05/24/2020   ALT 9 07/12/2020   AST 13 (L) 07/12/2020   NA 138 07/12/2020   K 3.7 07/12/2020   CL 104 07/12/2020   CREATININE 0.73 07/12/2020   BUN 14 07/12/2020   CO2 28 07/12/2020   TSH 2.48 05/24/2020   INR 1.01 12/09/2017   HGBA1C 6.5 05/24/2020   Assessment and Plan: See notes  Follow Up Instructions: See notes   I discussed the assessment and treatment plan with the patient. The patient was provided an opportunity to ask questions and all were answered. The patient agreed with the plan and demonstrated an understanding of the instructions.   The patient was advised to call back or seek an in-person evaluation if the symptoms worsen or if the condition fails to improve as anticipated.  Oliver Barre, MD

## 2020-10-01 NOTE — Assessment & Plan Note (Signed)
Post fall with large effusion and pain; for tramadol prn, left knee film, and refer sport medicine asap,,  to f/u any worsening symptoms or concerns

## 2020-10-01 NOTE — Patient Instructions (Signed)
Please take all new medication as prescribed  Please go to the XRAY Department for xray at the [facility] xray  You will be contacted regarding the referral for: Dr Katrinka Blazing

## 2020-10-03 ENCOUNTER — Encounter: Payer: Self-pay | Admitting: Internal Medicine

## 2020-10-04 ENCOUNTER — Other Ambulatory Visit: Payer: Self-pay | Admitting: Hematology & Oncology

## 2020-10-04 DIAGNOSIS — I82491 Acute embolism and thrombosis of other specified deep vein of right lower extremity: Secondary | ICD-10-CM

## 2020-10-04 DIAGNOSIS — I2692 Saddle embolus of pulmonary artery without acute cor pulmonale: Secondary | ICD-10-CM

## 2020-10-14 NOTE — Progress Notes (Signed)
I, Philbert Riser, LAT, ATC acting as a scribe for Clementeen Graham, MD.  Subjective:    CC: L knee pain  HPI: Pt is a 50 y/o female presenting w/ L knee pain and swelling after falling down some stairs on 09/29/20, striking her L knee on a hard floor.  She saw her PCP virtually on 10/01/20 and was prescribed Tramadol.  Today, pt reports L knee has improved, but she still has a pocket of swelling along the anterior-lateral aspect.  She locates her pain to the anterior aspect of her L knee.  L knee swelilng: yes L knee mechanical symptoms: yes Aggravating factors: knee flex, transitioning to stand Treatments tried: Tylenol, Tramadol  Diagnostic testing: L knee XR- 10/01/20  Pertinent review of Systems: No fevers or chills  Relevant historical information: History of PE currently on Pradaxa.   Objective:    Vitals:   10/15/20 1003  BP: 118/82  Pulse: 76  SpO2: 98%   General: Well Developed, well nourished, and in no acute distress.   MSK: Left knee moderate effusion.  No obvious swelling anterior knee. Normal motion.  Stable ligamentous exam.  Intact strength.  Some pain with resisted knee extension.  Lab and Radiology Results  Procedure: Real-time Ultrasound Guided Injection of knee superior patellar space Device: Philips Affiniti 50G Images permanently stored and available for review in PACS Ultrasound evaluation prior to injection reveals moderate joint effusion.  Intact patellar and quad tendon. Trace prepatellar bursitis is present. Verbal informed consent obtained.  Discussed risks and benefits of procedure. Warned about infection bleeding damage to structures skin hypopigmentation and fat atrophy among others. Patient expresses understanding and agreement Time-out conducted.   Noted no overlying erythema, induration, or other signs of local infection.   Skin prepped in a sterile fashion.   Local anesthesia: Topical Ethyl chloride.   With sterile technique and under  real time ultrasound guidance: 40 mg of Kenalog and 2 mL of lidocaine injected into knee joint. Fluid seen entering the joint capsule.   Completed without difficulty   Pain immediately resolved suggesting accurate placement of the medication.   Advised to call if fevers/chills, erythema, induration, drainage, or persistent bleeding.   Images permanently stored and available for review in the ultrasound unit.  Impression: Technically successful ultrasound guided injection.   X-ray ordered by Dr. Galen Manila: LEFT KNEE - COMPLETE 4+ VIEW   COMPARISON:  01/13/2015   FINDINGS: There is lateral subluxation of the tibia with respect to the distal femur although this is stable in appearance from the prior exam. Some progressive osteophytic changes are noted in all 3 joint compartments. Small joint effusion is noted. No acute fracture or dislocation is seen.   IMPRESSION: Progressive degenerative change with small joint effusion.   Stable lateral subluxation of the tibia with respect to the distal femur.     Electronically Signed   By: Alcide Clever M.D.   On: 10/02/2020 23:12  I, Clementeen Graham, personally (independently) visualized and performed the interpretation of the images attached in this note.  Moderate DJD changes per my read     Impression and Recommendations:    Assessment and Plan: 50 y.o. female with left knee pain after fall.  Pain predominantly due to exacerbation of DJD.  She does have some small remaining prepatellar bursitis however this is a lesser issue.  Plan to proceed with intra-articular steroid injection today and compression.  All so recommend Voltaren gel.  This should be reasonably safe along with  her Pradaxa.  Certainly much safer than oral NSAIDs.  Recheck back as needed.  PDMP not reviewed this encounter. Orders Placed This Encounter  Procedures   [place] LIMITED JOINT SPACE STRUCTURES LOW LEFT(NO LINKED CHARGES)    Standing Status:   Future    Number of  Occurrences:   1    Standing Expiration Date:   04/15/2021    Order Specific Question:   Reason for Exam (SYMPTOM  OR DIAGNOSIS REQUIRED)    Answer:   left knee pain    Order Specific Question:   Preferred imaging location?    Answer:   [facility]   No orders of the defined types were placed in this encounter.   Discussed warning signs or symptoms. Please see discharge instructions. Patient expresses understanding.   The above documentation has been reviewed and is accurate and complete Clementeen Graham, M.D.

## 2020-10-15 ENCOUNTER — Ambulatory Visit: Payer: Self-pay

## 2020-10-15 ENCOUNTER — Encounter: Payer: Self-pay | Admitting: Family Medicine

## 2020-10-15 ENCOUNTER — Ambulatory Visit (INDEPENDENT_AMBULATORY_CARE_PROVIDER_SITE_OTHER): Payer: 59 | Admitting: Family Medicine

## 2020-10-15 ENCOUNTER — Other Ambulatory Visit: Payer: Self-pay

## 2020-10-15 VITALS — BP 118/82 | HR 76 | Ht 66.85 in | Wt 252.8 lb

## 2020-10-15 DIAGNOSIS — M25562 Pain in left knee: Secondary | ICD-10-CM

## 2020-10-15 NOTE — Patient Instructions (Addendum)
Thank you for coming in today.   You received an injection today. Seek immediate medical attention if the joint becomes red, extremely painful, or is oozing fluid.   Please use Voltaren gel (Generic Diclofenac Gel) up to 4x daily for pain as needed.  This is available over-the-counter as both the name brand Voltaren gel and the generic diclofenac gel.   I recommend you obtained a compression sleeve to help with your joint problems. There are many options on the market however I recommend obtaining a full knee Body Helix compression sleeve.  You can find information (including how to appropriate measure yourself for sizing) can be found at [email or web address].  Many of these products are health savings account (HSA) eligible.   You can use the compression sleeve at any time throughout the day but is most important to use while being active as well as for 2 hours post-activity.   It is appropriate to ice following activity with the compression sleeve in place.   Recheck as needed.

## 2020-11-29 ENCOUNTER — Encounter: Payer: Self-pay | Admitting: Internal Medicine

## 2020-11-29 ENCOUNTER — Ambulatory Visit (INDEPENDENT_AMBULATORY_CARE_PROVIDER_SITE_OTHER): Payer: 59 | Admitting: Internal Medicine

## 2020-11-29 ENCOUNTER — Other Ambulatory Visit: Payer: Self-pay

## 2020-11-29 VITALS — BP 120/72 | HR 52 | Temp 98.2°F | Ht 66.25 in | Wt 250.0 lb

## 2020-11-29 DIAGNOSIS — Z6841 Body Mass Index (BMI) 40.0 and over, adult: Secondary | ICD-10-CM

## 2020-11-29 DIAGNOSIS — R739 Hyperglycemia, unspecified: Secondary | ICD-10-CM

## 2020-11-29 DIAGNOSIS — R350 Frequency of micturition: Secondary | ICD-10-CM

## 2020-11-29 DIAGNOSIS — E559 Vitamin D deficiency, unspecified: Secondary | ICD-10-CM | POA: Diagnosis not present

## 2020-11-29 DIAGNOSIS — M25562 Pain in left knee: Secondary | ICD-10-CM

## 2020-11-29 DIAGNOSIS — E538 Deficiency of other specified B group vitamins: Secondary | ICD-10-CM | POA: Diagnosis not present

## 2020-11-29 DIAGNOSIS — Z23 Encounter for immunization: Secondary | ICD-10-CM | POA: Diagnosis not present

## 2020-11-29 DIAGNOSIS — I10 Essential (primary) hypertension: Secondary | ICD-10-CM

## 2020-11-29 LAB — URINALYSIS, ROUTINE W REFLEX MICROSCOPIC
Bilirubin Urine: NEGATIVE
Hgb urine dipstick: NEGATIVE
Ketones, ur: NEGATIVE
Leukocytes,Ua: NEGATIVE
Nitrite: NEGATIVE
Specific Gravity, Urine: 1.02 (ref 1.000–1.030)
Total Protein, Urine: NEGATIVE
Urine Glucose: NEGATIVE
Urobilinogen, UA: 0.2 (ref 0.0–1.0)
pH: 7 (ref 5.0–8.0)

## 2020-11-29 LAB — HEMOGLOBIN A1C: Hgb A1c MFr Bld: 6.4 % (ref 4.6–6.5)

## 2020-11-29 NOTE — Progress Notes (Signed)
Patient ID: Hannah Thompson, female   DOB: Jan 07, 1970, 50 y.o.   MRN: [id number]        Chief Complaint: follow up HTN, HLD and hyperglycemia, left knee pain, low b12 and Vit D       HPI:  Hannah Thompson is a 50 y.o. female here with c/o acute 3 days worsening left knee pain after twisting with getting OOB x 2 days, with mod swelling and pain, no other giveaways or falls, worse to stand and walk, better to sit.  Not taking b12 or D.  Due for flu shot  Working on trying to lose wt.  Pt denies chest pain, increased sob or doe, wheezing, orthopnea, PND, increased LE swelling, palpitations, dizziness or syncope.   Pt denies polydipsia, polyuria, or new focal neuro s/s.  Denies urinary symptoms such as dysuria, urgency, flank pain, hematuria or n/v, fever, chills. But incidentally c/o urianry frequnecy Wt Readings from Last 3 Encounters:  12/02/20 251 lb (113.9 kg)  11/29/20 250 lb (113.4 kg)  10/15/20 252 lb 12.8 oz (114.7 kg)   BP Readings from Last 3 Encounters:  12/02/20 128/86  11/29/20 120/72  10/15/20 118/82         Past Medical History:  Diagnosis Date   ALLERGIC RHINITIS 03/17/2009   Anemia    ASTHMA 03/17/2009   DVT (deep venous thrombosis) ([place])    ECZEMA 03/17/2009   Headache(784.0) 06/21/2009   recurrent   HYPERLIPIDEMIA 03/17/2009   KELOID 06/21/2009   Pulmonary emboli ([place])    VITAMIN B12 DEFICIENCY 03/18/2009   Past Surgical History:  Procedure Laterality Date   KELOID EXCISION  mid 1990's   s/p keloid removal-laser     reports that she quit smoking about 12 years ago. Her smoking use included cigarettes. She started smoking about 16 years ago. She has a 1.00 pack-year smoking history. She has never been exposed to tobacco smoke. She has never used smokeless tobacco. She reports current alcohol use. She reports that she does not use drugs. family history includes Alcohol abuse in an other family member; Allergies in her sister; Angioedema in her sister; Cancer in her father,  paternal aunt, and another family member; Deep vein thrombosis in her sister; Diabetes in her mother and another family member; Eczema in her sister; Heart disease in an other family member; Hypertension in her father and mother. Allergies  Allergen Reactions   Avocado Nausea And Vomiting    Projectile N/V   Fish Allergy Anaphylaxis and Swelling    [place].Swelling of lips, throat and face.   Oxaprozin     angioedema   Sulfonamide Derivatives     REACTION: hives   Latex Rash   Nsaids Rash   Orange Juice [Orange Oil] Hives    Orange juice   Tomato Hives    tomatoes   Zyrtec [Cetirizine] Hives and Rash   Current Outpatient Medications on File Prior to Visit  Medication Sig Dispense Refill   acetaminophen (TYLENOL) 325 MG tablet Take 2 tablets (650 mg total) by mouth every 6 (six) hours as needed for mild pain, moderate pain or headache (or Fever >/= 101).     cromolyn (OPTICROM) 4 % ophthalmic solution Place 1 drop into both eyes 4 (four) times daily as needed (itchy/watery eyes). 10 mL 5   EPINEPHrine 0.3 mg/0.3 mL IJ SOAJ injection Inject 0.3 mg into the muscle as needed for anaphylaxis. 1 each 2   meloxicam (MOBIC) 15 MG tablet TAKE 1 TABLET BY MOUTH  ONCE DAILY AS NEEDED FOR PAIN 90 tablet 0   traMADol (ULTRAM) 50 MG tablet Take 1 tablet (50 mg total) by mouth every 6 (six) hours as needed. 30 tablet 0   phentermine 37.5 MG capsule Take 1 capsule (37.5 mg total) by mouth every morning. (Patient not taking: Reported on 08/09/2020) 30 capsule 2   No current facility-administered medications on file prior to visit.        ROS:  All others reviewed and negative.  Objective        PE:  BP 120/72 (BP Location: Right Arm, Patient Position: Sitting, Cuff Size: Large)   Pulse (!) 52   Temp 98.2 F (36.8 C) (Oral)   Ht 5' 6.25" (1.683 m)   Wt 250 lb (113.4 kg)   SpO2 100%   BMI 40.05 kg/m                 Constitutional: Pt appears in NAD               HENT: Head: NCAT.                 Right Ear: External ear normal.                 Left Ear: External ear normal.                Eyes: . Pupils are equal, round, and reactive to light. Conjunctivae and EOM are normal               Nose: without d/c or deformity               Neck: Neck supple. Gross normal ROM               Cardiovascular: Normal rate and regular rhythm.                 Pulmonary/Chest: Effort normal and breath sounds without rales or wheezing.                Left knee with iliotibial band insertion site tenderness and swelling; also noted is mild to mod sized tender post baker cyst by palpation               Neurological: Pt is alert. At baseline orientation, motor grossly intact               Skin: Skin is warm. No rashes, no other new lesions, LE edema - none               Psychiatric: Pt behavior is normal without agitation   Micro: none  Cardiac tracings I have personally interpreted today:  none  Pertinent Radiological findings (summarize): none   Lab Results  Component Value Date   WBC 4.1 07/12/2020   HGB 11.7 (L) 07/12/2020   HCT 35.9 (L) 07/12/2020   PLT 183 07/12/2020   GLUCOSE 88 07/12/2020   CHOL 142 05/24/2020   TRIG 138.0 05/24/2020   HDL 35.90 (L) 05/24/2020   LDLCALC 78 05/24/2020   ALT 9 07/12/2020   AST 13 (L) 07/12/2020   NA 138 07/12/2020   K 3.7 07/12/2020   CL 104 07/12/2020   CREATININE 0.73 07/12/2020   BUN 14 07/12/2020   CO2 28 07/12/2020   TSH 2.48 05/24/2020   INR 1.01 12/09/2017   HGBA1C 6.4 11/29/2020   Assessment/Plan:  Hannah Thompson is a 50 y.o. Black or African American [2] female with  has  a past medical history of ALLERGIC RHINITIS (03/17/2009), Anemia, ASTHMA (03/17/2009), DVT (deep venous thrombosis) ([place]), ECZEMA (03/17/2009), Headache(784.0) (06/21/2009), HYPERLIPIDEMIA (03/17/2009), KELOID (06/21/2009), Pulmonary emboli ([place]), and VITAMIN B12 DEFICIENCY (03/18/2009).  B12 deficiency Lab Results  Component Value Date   VITAMINB12 201 (L) 05/24/2020    Low, reminded to start oral replacement - b12 1000 mcg qd   Hyperglycemia Lab Results  Component Value Date   HGBA1C 6.4 11/29/2020   Stable, pt to continue current medical treatment  - diet, wt control, exercise as ablve   Hypertension BP Readings from Last 3 Encounters:  12/02/20 128/86  11/29/20 120/72  10/15/20 118/82   Stable, pt to continue medical treatment  - currently no med tx, for low salt diet, wt control, diet excercise   Vitamin D deficiency Last vitamin D Lab Results  Component Value Date   VD25OH 9.78 (L) 05/24/2020   Low, reminded to start oral replacement   Obesity Uncontrolled, pt ok for referral to medical wt management  Left knee pain Recent worsening, with iliotibial band tendonitis and post baker cyst left knee, for pain control, refer sports medicine  Urinary frequency Also for UA, exam benign  Followup: Return in about 6 months (around 05/29/2021).  Cathlean Cower, MD 12/05/2020 9:16 AM [facility]

## 2020-11-29 NOTE — Patient Instructions (Addendum)
You had the flu shot today  Please make an appt with Sports Medicine for the possible ileotibial band tendonitis, and possible left knee baker cyst  You will be contacted regarding the referral for: weight management  Please continue all other medications as before, and refills have been done if requested.  Please have the pharmacy call with any other refills you may need.  Please continue your efforts at being more active, low cholesterol diet, and weight control  Please keep your appointments with your specialists as you may have planned  Please go to the LAB at the blood drawing area for the tests to be done - the A1c and the urine testing today  You will be contacted by phone if any changes need to be made immediately.  Otherwise, you will receive a letter about your results with an explanation, but please check with MyChart first.  Please remember to sign up for MyChart if you have not done so, as this will be important to you in the future with finding out test results, communicating by private email, and scheduling acute appointments online when needed.  Please make an Appointment to return in 6 months, or sooner if needed, also with Lab Appointment for testing done 3-5 days before at the FIRST FLOOR Lab (so this is for TWO appointments - please see the scheduling desk as you leave)  Due to the ongoing Covid 19 pandemic, our lab now requires an appointment for any labs done at our office.  If you need labs done and do not have an appointment, please call our office ahead of time to schedule before presenting to the lab for your testing.

## 2020-11-30 LAB — URINE CULTURE: Result:: NO GROWTH

## 2020-12-01 ENCOUNTER — Encounter: Payer: Self-pay | Admitting: Internal Medicine

## 2020-12-01 NOTE — Progress Notes (Signed)
   I, Christoper Fabian, LAT, ATC, am serving as scribe for Dr. Clementeen Graham.  Hannah Thompson is a 50 y.o. female who presents to [facility] today for f/u of L ant knee pain that began after falling down some stairs on 09/29/20, striking her L knee on a hard floor.  She was last seen by Dr. Denyse Amass on 10/15/20 and had a L knee steroid injection.  She was also advised to use Voltaren gel and a knee compression sleeve.  Today, pt reports last Friday she was down on her knees decorating the Christmas tree and experienced an increased pain when trying to get back up. Pt notes the L knee was swollen and very painful after. Pt locates pain to anterior and posterior aspects of the L knee.  Diagnostic testing: L knee XR- 10/01/20  Pertinent review of systems: No fevers or chills  Relevant historical information: History of DVT and pulmonary embolism on Pradaxa.   Exam:  BP 128/86   Pulse 62   Ht 5\' 6"  (1.676 m)   Wt 251 lb (113.9 kg)   SpO2 96%   BMI 40.51 kg/m  General: Well Developed, well nourished, and in no acute distress.   MSK: Left knee mild effusion normal-appearing otherwise. Normal motion with crepitation. Stable ligamentous exam.    Lab and Radiology Results  EXAM: LEFT KNEE - COMPLETE 4+ VIEW   COMPARISON:  01/13/2015   FINDINGS: There is lateral subluxation of the tibia with respect to the distal femur although this is stable in appearance from the prior exam. Some progressive osteophytic changes are noted in all 3 joint compartments. Small joint effusion is noted. No acute fracture or dislocation is seen.   IMPRESSION: Progressive degenerative change with small joint effusion.   Stable lateral subluxation of the tibia with respect to the distal femur.     Electronically Signed   By: 03/13/2015 M.D.   On: 10/02/2020 23:12   I, 12/02/2020, personally (independently) visualized and performed the interpretation of the images attached in  this note.     Assessment and Plan: 50 y.o. female with left knee pain due to DJD exacerbation.  May also have degenerative meniscus tear as well.  Patient had steroid injection October 14 which worked until recently.  Discussed next steps.  Would be either hyaluronic acid injections or Zilretta.  We will work on October 16 now.  May be able to get hyaluronic acid injections approved in the new year.  If not able to get any of these improved would proceed with repeat regular steroid injection sooner than 3 months as an emergency backup plan..    Discussed warning signs or symptoms. Please see discharge instructions. Patient expresses understanding.   The above documentation has been reviewed and is accurate and complete [employer], M.D.   Total encounter time 20 minutes including face-to-face time with the patient and, reviewing past medical record, and charting on the date of service.   Treatment plan and options

## 2020-12-02 ENCOUNTER — Other Ambulatory Visit: Payer: Self-pay

## 2020-12-02 ENCOUNTER — Ambulatory Visit (INDEPENDENT_AMBULATORY_CARE_PROVIDER_SITE_OTHER): Payer: 59 | Admitting: Family Medicine

## 2020-12-02 ENCOUNTER — Other Ambulatory Visit: Payer: Self-pay | Admitting: Hematology & Oncology

## 2020-12-02 VITALS — BP 128/86 | HR 62 | Ht 66.0 in | Wt 251.0 lb

## 2020-12-02 DIAGNOSIS — I2692 Saddle embolus of pulmonary artery without acute cor pulmonale: Secondary | ICD-10-CM

## 2020-12-02 DIAGNOSIS — M1712 Unilateral primary osteoarthritis, left knee: Secondary | ICD-10-CM

## 2020-12-02 DIAGNOSIS — I82491 Acute embolism and thrombosis of other specified deep vein of right lower extremity: Secondary | ICD-10-CM

## 2020-12-02 DIAGNOSIS — M25562 Pain in left knee: Secondary | ICD-10-CM | POA: Diagnosis not present

## 2020-12-02 NOTE — Patient Instructions (Addendum)
Thank you for coming in today.   We will work on getting Zilretta injection approved and will call you once we have an answer.  Recheck back once Zilretta is approved.

## 2020-12-05 ENCOUNTER — Encounter: Payer: Self-pay | Admitting: Internal Medicine

## 2020-12-05 DIAGNOSIS — R350 Frequency of micturition: Secondary | ICD-10-CM | POA: Insufficient documentation

## 2020-12-05 NOTE — Assessment & Plan Note (Signed)
Last vitamin D Lab Results  Component Value Date   VD25OH 9.78 (L) 05/24/2020   Low, reminded to start oral replacement

## 2020-12-05 NOTE — Assessment & Plan Note (Signed)
BP Readings from Last 3 Encounters:  12/02/20 128/86  11/29/20 120/72  10/15/20 118/82   Stable, pt to continue medical treatment  - currently no med tx, for low salt diet, wt control, diet excercise

## 2020-12-05 NOTE — Addendum Note (Signed)
Addended by: Corwin Levins on: 12/05/2020 09:17 AM   Modules accepted: Orders

## 2020-12-05 NOTE — Assessment & Plan Note (Signed)
Lab Results  Component Value Date   HGBA1C 6.4 11/29/2020   Stable, pt to continue current medical treatment  - diet, wt control, exercise as ablve

## 2020-12-05 NOTE — Assessment & Plan Note (Signed)
Uncontrolled, pt ok for referral to medical wt management

## 2020-12-05 NOTE — Assessment & Plan Note (Signed)
Also for UA, exam benign

## 2020-12-05 NOTE — Assessment & Plan Note (Signed)
Lab Results  Component Value Date   VITAMINB12 201 (L) 05/24/2020   Low, reminded to start oral replacement - b12 1000 mcg qd

## 2020-12-05 NOTE — Assessment & Plan Note (Signed)
Recent worsening, with iliotibial band tendonitis and post baker cyst left knee, for pain control, refer sports medicine

## 2020-12-09 ENCOUNTER — Telehealth: Payer: Self-pay | Admitting: Family Medicine

## 2020-12-09 NOTE — Telephone Encounter (Signed)
[facility] called [phone] authorized 1 unit  12/08/2020-01/01/2021 Ref [id number]  Faxing confirmation Phone [phone] Fax [phone]

## 2020-12-13 NOTE — Telephone Encounter (Signed)
Appointment scheduled, Zilretta ordered.

## 2020-12-16 ENCOUNTER — Ambulatory Visit: Payer: Self-pay

## 2020-12-16 ENCOUNTER — Other Ambulatory Visit: Payer: Self-pay

## 2020-12-16 ENCOUNTER — Ambulatory Visit (INDEPENDENT_AMBULATORY_CARE_PROVIDER_SITE_OTHER): Payer: 59 | Admitting: Family Medicine

## 2020-12-16 DIAGNOSIS — G8929 Other chronic pain: Secondary | ICD-10-CM | POA: Diagnosis not present

## 2020-12-16 DIAGNOSIS — M25562 Pain in left knee: Secondary | ICD-10-CM | POA: Diagnosis not present

## 2020-12-16 DIAGNOSIS — M1712 Unilateral primary osteoarthritis, left knee: Secondary | ICD-10-CM

## 2020-12-16 NOTE — Patient Instructions (Signed)
Thank you for coming in today.   You received an injection today. Seek immediate medical attention if the joint becomes red, extremely painful, or is oozing fluid.   Recheck back as needed.   

## 2020-12-16 NOTE — Progress Notes (Signed)
Hannah Thompson presents to clinic today for Zilretta injection left knee  Procedure: Real-time Ultrasound Guided Injection of left knee superior lateral patellar space Device: Philips Affiniti 50G Images permanently stored and available for review in PACS Verbal informed consent obtained.  Discussed risks and benefits of procedure. Warned about infection bleeding damage to structures skin hypopigmentation and fat atrophy among others. Patient expresses understanding and agreement Time-out conducted.   Noted no overlying erythema, induration, or other signs of local infection.   Skin prepped in a sterile fashion.   Local anesthesia: Topical Ethyl chloride.   With sterile technique and under real time ultrasound guidance: Zilretta 32 mg injected into knee joint. Fluid seen entering the joint capsule.   Completed without difficulty   Advised to call if fevers/chills, erythema, induration, drainage, or persistent bleeding.   Images permanently stored and available for review in the ultrasound unit.  Impression: Technically successful ultrasound guided injection.   Lot number: [id number]

## 2021-01-17 ENCOUNTER — Inpatient Hospital Stay: Payer: Medicaid Other | Attending: Hematology & Oncology

## 2021-01-17 ENCOUNTER — Inpatient Hospital Stay: Payer: Medicaid Other | Admitting: Family

## 2021-02-09 ENCOUNTER — Other Ambulatory Visit: Payer: Self-pay | Admitting: Hematology & Oncology

## 2021-02-09 ENCOUNTER — Ambulatory Visit: Payer: 59 | Admitting: Allergy

## 2021-02-09 DIAGNOSIS — I82491 Acute embolism and thrombosis of other specified deep vein of right lower extremity: Secondary | ICD-10-CM

## 2021-02-09 DIAGNOSIS — I2692 Saddle embolus of pulmonary artery without acute cor pulmonale: Secondary | ICD-10-CM

## 2021-02-15 NOTE — Progress Notes (Signed)
Follow Up Note  RE: Hannah Thompson MRN: [id number] DOB: 25-Aug-1970 Date of Office Visit: 02/16/2021  Referring provider: Corwin Levins, MD Primary care provider: Corwin Levins, MD  Chief Complaint: Eczema (Some dry skin ) and Allergic Rhinitis  (No issues )  History of Present Illness: I had the pleasure of seeing Hannah Thompson for a follow up visit at the [facility] on 02/16/2021. She is a 51 y.o. female, who is being followed for allergic rhinoconjunctivitis, adverse food reaction, atopic dermatitis and multiple drug allergies. Her previous allergy office visit was on 08/09/2020 with Dr. Selena Batten. Today is a regular follow up visit.  Allergic rhinitis Not taking any daily medications. Only has to use medications as needed which control her symptoms.   Food Avoiding foods that are bothersome - melons, oranges, coconut, fresh tomatoes, avocado, pollock. No reactions. Epipen still up to date.  Other atopic dermatitis Had a mild breakout and wants triamcinolone ointment Rx.   Assessment and Plan: Hannah Thompson is a 51 y.o. female with: Seasonal and perennial allergic rhinoconjunctivitis Past history - Perennial rhinoconjunctivitis symptoms for 40+ years with worsening from spring through fall.  Zyrtec caused a rash and swelling.  Skin testing over 20 years ago showed multiple positives per patient report and was on allergy immunotherapy for a few years with good benefit. 2022 skin testing showed: Positive to grass, weed pollen, ragweed, trees, mold, dust mites, cat, feathers, cockroach. Interim history - only using meds prn with good benefit.  Continue environmental control measures as below. Use over the counter antihistamines such as Claritin (loratadine), Allegra (fexofenadine), or Xyzal (levocetirizine) daily as needed. May take twice a day during allergy flares. May switch antihistamines every few months. Use Flonase (fluticasone) OR Nasonex nasal spray 1 spray per nostril  twice a day as needed for nasal congestion.  Nasal saline spray (i.e., Simply Saline) or nasal saline lavage (i.e., NeilMed) is recommended as needed and prior to medicated nasal sprays.  Use cromolyn 4% 1 drop in each eye up to four times a day as needed for itchy/watery eyes.   Other adverse food reactions, not elsewhere classified, subsequent encounter Past history - Fresh tomatoes cause facial pruritus/? Tongue swelling. Tolerates processed tomatoes. Coconut causes contact rash and perioral pruritus. Melons/oranges cause perioral pruritus. Avocados caused vomiting/pruritus. Pollock caused nausea, pruritus and eye swelling. Eats other seafood with no issues. 2022 skin testing showed: Borderline positive to soy, hazelnut, pistachio, orange, cantaloupe and watermelon. Interim history - no reactions.  Continue to avoid foods that are bothersome - melons, oranges, coconut, fresh tomatoes, avocado, pollock. For mild symptoms you can take over the counter antihistamines such as Benadryl and monitor symptoms closely. If symptoms worsen or if you have severe symptoms including breathing issues, throat closure, significant swelling, whole body hives, severe diarrhea and vomiting, lightheadedness then inject epinephrine and seek immediate medical care afterwards. Action plan in place.   Other atopic dermatitis Continue proper skin care. Use triamcinolone 0.1% ointment twice a day as needed for rash flares. Do not use on the face, neck, armpits or groin area. Do not use more than 3 weeks in a row  Multiple drug allergies Continue to avoid drugs on your allergy list.   Return in about 6 months (around 08/16/2021).  Meds ordered this encounter  Medications   triamcinolone ointment (KENALOG) 0.1 %    Sig: Apply 1 application topically 2 (two) times daily as needed (rash flare). Do not use on the  face, neck, armpits or groin area. Do not use more than 3 weeks in a row.    Dispense:  30 g    Refill:  1    Lab Orders  No laboratory test(s) ordered today    Diagnostics: None.   Medication List:  Current Outpatient Medications  Medication Sig Dispense Refill   dabigatran (PRADAXA) 150 MG CAPS capsule TAKE 1 CAPSULE BY MOUTH EVERY 12 HOURS 60 capsule 0   EPINEPHrine 0.3 mg/0.3 mL IJ SOAJ injection Inject 0.3 mg into the muscle as needed for anaphylaxis. 1 each 2   triamcinolone ointment (KENALOG) 0.1 % Apply 1 application topically 2 (two) times daily as needed (rash flare). Do not use on the face, neck, armpits or groin area. Do not use more than 3 weeks in a row. 30 g 1   cromolyn (OPTICROM) 4 % ophthalmic solution Place 1 drop into both eyes 4 (four) times daily as needed (itchy/watery eyes). (Patient not taking: Reported on 02/16/2021) 10 mL 5   phentermine 37.5 MG capsule Take 1 capsule (37.5 mg total) by mouth every morning. (Patient not taking: Reported on 02/16/2021) 30 capsule 2   No current facility-administered medications for this visit.   Allergies: Allergies  Allergen Reactions   Avocado Nausea And Vomiting    Projectile N/V   Fish Allergy Anaphylaxis and Swelling    Pollock.Swelling of lips, throat and face.   Oxaprozin     angioedema   Sulfonamide Derivatives     REACTION: hives   Latex Rash   Nsaids Rash   Orange Juice [Orange Oil] Hives    Orange juice   Tomato Hives    tomatoes   Zyrtec [Cetirizine] Hives and Rash   I reviewed her past medical history, social history, family history, and environmental history and no significant changes have been reported from her previous visit.  Review of Systems  Constitutional:  Negative for appetite change, chills, fever and unexpected weight change.  HENT:  Negative for congestion and rhinorrhea.   Eyes:  Negative for itching.  Respiratory:  Negative for cough, chest tightness, shortness of breath and wheezing.   Cardiovascular:  Negative for chest pain.  Gastrointestinal:  Negative for abdominal pain.   Genitourinary:  Negative for difficulty urinating.  Skin:  Negative for rash.  Allergic/Immunologic: Positive for environmental allergies and food allergies.  Neurological:  Negative for headaches.   Objective: BP 124/86    Pulse 60    Temp 98.2 F (36.8 C)    Resp 18    Ht 5\' 6"  (1.676 m)    Wt 251 lb 12.8 oz (114.2 kg)    SpO2 99%    BMI 40.64 kg/m  Body mass index is 40.64 kg/m. Physical Exam Vitals and nursing note reviewed.  Constitutional:      Appearance: Normal appearance. She is well-developed.  HENT:     Head: Normocephalic and atraumatic.     Right Ear: Tympanic membrane and external ear normal.     Left Ear: Tympanic membrane and external ear normal.     Nose: Nose normal.     Mouth/Throat:     Mouth: Mucous membranes are moist.     Pharynx: Oropharynx is clear.  Eyes:     Conjunctiva/sclera: Conjunctivae normal.  Cardiovascular:     Rate and Rhythm: Normal rate and regular rhythm.     Heart sounds: Normal heart sounds. No murmur heard.   No friction rub. No gallop.  Pulmonary:     Effort:  Pulmonary effort is normal.     Breath sounds: Normal breath sounds. No wheezing, rhonchi or rales.  Abdominal:     Palpations: Abdomen is soft.  Musculoskeletal:     Cervical back: Neck supple.  Skin:    General: Skin is warm.     Findings: No rash.  Neurological:     Mental Status: She is alert and oriented to person, place, and time.  Previous notes and tests were reviewed. The plan was reviewed with the patient/family, and all questions/concerned were addressed.  It was my pleasure to see Hannah Thompson today and participate in her care. Please feel free to contact me with any questions or concerns.  Sincerely,  Wyline Mood, DO Allergy & Immunology  [facility] office: [phone] [facility] office: [phone]

## 2021-02-16 ENCOUNTER — Other Ambulatory Visit: Payer: Self-pay

## 2021-02-16 ENCOUNTER — Encounter: Payer: Self-pay | Admitting: Allergy

## 2021-02-16 ENCOUNTER — Ambulatory Visit (INDEPENDENT_AMBULATORY_CARE_PROVIDER_SITE_OTHER): Payer: 59 | Admitting: Allergy

## 2021-02-16 VITALS — BP 124/86 | HR 60 | Temp 98.2°F | Resp 18 | Ht 66.0 in | Wt 251.8 lb

## 2021-02-16 DIAGNOSIS — H1013 Acute atopic conjunctivitis, bilateral: Secondary | ICD-10-CM

## 2021-02-16 DIAGNOSIS — J3089 Other allergic rhinitis: Secondary | ICD-10-CM | POA: Diagnosis not present

## 2021-02-16 DIAGNOSIS — L2089 Other atopic dermatitis: Secondary | ICD-10-CM

## 2021-02-16 DIAGNOSIS — H1010 Acute atopic conjunctivitis, unspecified eye: Secondary | ICD-10-CM

## 2021-02-16 DIAGNOSIS — Z889 Allergy status to unspecified drugs, medicaments and biological substances status: Secondary | ICD-10-CM

## 2021-02-16 DIAGNOSIS — T781XXD Other adverse food reactions, not elsewhere classified, subsequent encounter: Secondary | ICD-10-CM

## 2021-02-16 MED ORDER — TRIAMCINOLONE ACETONIDE 0.1 % EX OINT: 1.0000 "application " | TOPICAL_OINTMENT | Freq: Two times a day (BID) | CUTANEOUS | 1 refills | Status: DC | PRN

## 2021-02-16 NOTE — Assessment & Plan Note (Signed)
   Continue proper skin care. . Use triamcinolone 0.1% ointment twice a day as needed for rash flares. Do not use on the face, neck, armpits or groin area. Do not use more than 3 weeks in a row.  

## 2021-02-16 NOTE — Assessment & Plan Note (Signed)
Past history - Fresh tomatoes cause facial pruritus/? Tongue swelling. Tolerates processed tomatoes. Coconut causes contact rash and perioral pruritus. Melons/oranges cause perioral pruritus. Avocados caused vomiting/pruritus. Pollock caused nausea, pruritus and eye swelling. Eats other seafood with no issues. 2022 skin testing showed: Borderline positive to soy, hazelnut, pistachio, orange, cantaloupe and watermelon. Interim history - no reactions.  Continue to avoid foods that are bothersome - melons, oranges, coconut, fresh tomatoes, avocado, pollock. For mild symptoms you can take over the counter antihistamines such as Benadryl and monitor symptoms closely. If symptoms worsen or if you have severe symptoms including breathing issues, throat closure, significant swelling, whole body hives, severe diarrhea and vomiting, lightheadedness then inject epinephrine and seek immediate medical care afterwards. Action plan in place.  

## 2021-02-16 NOTE — Assessment & Plan Note (Addendum)
Past history - Perennial rhinoconjunctivitis symptoms for 40+ years with worsening from spring through fall.  Zyrtec caused a rash and swelling.  Skin testing over 20 years ago showed multiple positives per patient report and was on allergy immunotherapy for a few years with good benefit. 2022 skin testing showed: Positive to grass, weed pollen, ragweed, trees, mold, dust mites, cat, feathers, cockroach. Interim history - only using meds prn with good benefit.   Continue environmental control measures as below.  Use over the counter antihistamines such as Claritin (loratadine), Allegra (fexofenadine), or Xyzal (levocetirizine) daily as needed. May take twice a day during allergy flares. May switch antihistamines every few months.  Use Flonase (fluticasone) OR Nasonex nasal spray 1 spray per nostril twice a day as needed for nasal congestion.   Nasal saline spray (i.e., Simply Saline) or nasal saline lavage (i.e., NeilMed) is recommended as needed and prior to medicated nasal sprays.  Use cromolyn 4% 1 drop in each eye up to four times a day as needed for itchy/watery eyes.

## 2021-02-16 NOTE — Patient Instructions (Addendum)
Environmental allergies: 2022 skin testing showed: Positive to grass, weed pollen, ragweed, trees, mold, dust mites, cat, feathers, cockroach. Continue environmental control measures as below. Use over the counter antihistamines such as Claritin (loratadine), Allegra (fexofenadine), or Xyzal (levocetirizine) daily as needed. May take twice a day during allergy flares. May switch antihistamines every few months. Use Flonase (fluticasone) OR Nasonex nasal spray 1 spray per nostril twice a day as needed for nasal congestion.  Nasal saline spray (i.e., Simply Saline) or nasal saline lavage (i.e., NeilMed) is recommended as needed and prior to medicated nasal sprays. Use cromolyn 4% 1 drop in each eye up to four times a day as needed for itchy/watery eyes.   Food: Continue to avoid foods that are bothersome - melons, oranges, coconut, fresh tomatoes, avocado, pollock. For mild symptoms you can take over the counter antihistamines such as Benadryl and monitor symptoms closely. If symptoms worsen or if you have severe symptoms including breathing issues, throat closure, significant swelling, whole body hives, severe diarrhea and vomiting, lightheadedness then inject epinephrine and seek immediate medical care afterwards. Action plan in place.   Drugs: Continue to avoid drugs on your allergy list.   Skin: Continue proper skin care. Use triamcinolone 0.1% ointment twice a day as needed for rash flares. Do not use on the face, neck, armpits or groin area. Do not use more than 3 weeks in a row.   Follow up in 6 months or sooner if needed.   Reducing Pollen Exposure Pollen seasons: trees (spring), grass (summer) and ragweed/weeds (fall). Keep windows closed in your home and car to lower pollen exposure.  Install air conditioning in the bedroom and throughout the house if possible.  Avoid going out in dry windy days - especially early morning. Pollen counts are highest between 5 - 10 AM and on dry, hot  and windy days.  Save outside activities for late afternoon or after a heavy rain, when pollen levels are lower.  Avoid mowing of grass if you have grass pollen allergy. Be aware that pollen can also be transported indoors on people and pets.  Dry your clothes in an automatic dryer rather than hanging them outside where they might collect pollen.  Rinse hair and eyes before bedtime. Control of House Dust Mite Allergen Dust mite allergens are a common trigger of allergy and asthma symptoms. While they can be found throughout the house, these microscopic creatures thrive in warm, humid environments such as bedding, upholstered furniture and carpeting. Because so much time is spent in the bedroom, it is essential to reduce mite levels there.  Encase pillows, mattresses, and box springs in special allergen-proof fabric covers or airtight, zippered plastic covers.  Bedding should be washed weekly in hot water (130 F) and dried in a hot dryer. Allergen-proof covers are available for comforters and pillows that cant be regularly washed.  Wash the allergy-proof covers every few months. Minimize clutter in the bedroom. Keep pets out of the bedroom.  Keep humidity less than 50% by using a dehumidifier or air conditioning. You can buy a humidity measuring device called a hygrometer to monitor this.  If possible, replace carpets with hardwood, linoleum, or washable area rugs. If that's not possible, vacuum frequently with a vacuum that has a HEPA filter. Remove all upholstered furniture and non-washable window drapes from the bedroom. Remove all non-washable stuffed toys from the bedroom.  Wash stuffed toys weekly. Mold Control Mold and fungi can grow on a variety of surfaces provided certain temperature  and moisture conditions exist.  Outdoor molds grow on plants, decaying vegetation and soil. The major outdoor mold, Alternaria and Cladosporium, are found in very high numbers during hot and dry conditions.  Generally, a late summer - fall peak is seen for common outdoor fungal spores. Rain will temporarily lower outdoor mold spore count, but counts rise rapidly when the rainy period ends. The most important indoor molds are Aspergillus and Penicillium. Dark, humid and poorly ventilated basements are ideal sites for mold growth. The next most common sites of mold growth are the bathroom and the kitchen. Outdoor (Seasonal) Mold Control Use air conditioning and keep windows closed. Avoid exposure to decaying vegetation. Avoid leaf raking. Avoid grain handling. Consider wearing a face mask if working in moldy areas.  Indoor (Perennial) Mold Control  Maintain humidity below 50%. Get rid of mold growth on hard surfaces with water, detergent and, if necessary, 5% bleach (do not mix with other cleaners). Then dry the area completely. If mold covers an area more than 10 square feet, consider hiring an indoor environmental professional. For clothing, washing with soap and water is best. If moldy items cannot be cleaned and dried, throw them away. Remove sources e.g. contaminated carpets. Repair and seal leaking roofs or pipes. Using dehumidifiers in damp basements may be helpful, but empty the water and clean units regularly to prevent mildew from forming. All rooms, especially basements, bathrooms and kitchens, require ventilation and cleaning to deter mold and mildew growth. Avoid carpeting on concrete or damp floors, and storing items in damp areas. Pet Allergen Avoidance: Contrary to popular opinion, there are no hypoallergenic breeds of dogs or cats. That is because people are not allergic to an animals hair, but to an allergen found in the animal's saliva, dander (dead skin flakes) or urine. Pet allergy symptoms typically occur within minutes. For some people, symptoms can build up and become most severe 8 to 12 hours after contact with the animal. People with severe allergies can experience reactions in  public places if dander has been transported on the pet owners clothing. Keeping an animal outdoors is only a partial solution, since homes with pets in the yard still have higher concentrations of animal allergens. Before getting a pet, ask your allergist to determine if you are allergic to animals. If your pet is already considered part of your family, try to minimize contact and keep the pet out of the bedroom and other rooms where you spend a great deal of time. As with dust mites, vacuum carpets often or replace carpet with a hardwood floor, tile or linoleum. High-efficiency particulate air (HEPA) cleaners can reduce allergen levels over time. While dander and saliva are the source of cat and dog allergens, urine is the source of allergens from rabbits, hamsters, mice and [place] pigs; so ask a non-allergic family member to clean the animals cage. If you have a pet allergy, talk to your allergist about the potential for allergy immunotherapy (allergy shots). This strategy can often provide long-term relief. Cockroach Allergen Avoidance Cockroaches are often found in the homes of densely populated urban areas, schools or commercial buildings, but these creatures can lurk almost anywhere. This does not mean that you have a dirty house or living area. Block all areas where roaches can enter the home. This includes crevices, wall cracks and windows.  Cockroaches need water to survive, so fix and seal all leaky faucets and pipes. Have an exterminator go through the house when your family and pets are  gone to eliminate any remaining roaches. Keep food in lidded containers and put pet food dishes away after your pets are done eating. Vacuum and sweep the floor after meals, and take out garbage and recyclables. Use lidded garbage containers in the kitchen. Wash dishes immediately after use and clean under stoves, refrigerators or toasters where crumbs can accumulate. Wipe off the stove and other kitchen  surfaces and cupboards regularly.  Skin care recommendations  Bath time: Always use lukewarm water. AVOID very hot or cold water. Keep bathing time to 5-10 minutes. Do NOT use bubble bath. Use a mild soap and use just enough to wash the dirty areas. Do NOT scrub skin vigorously.  After bathing, pat dry your skin with a towel. Do NOT rub or scrub the skin.  Moisturizers and prescriptions:  ALWAYS apply moisturizers immediately after bathing (within 3 minutes). This helps to lock-in moisture. Use the moisturizer several times a day over the whole body. Good summer moisturizers include: Aveeno, CeraVe, Cetaphil. Good winter moisturizers include: Aquaphor, Vaseline, Cerave, Cetaphil, Eucerin, Vanicream. When using moisturizers along with medications, the moisturizer should be applied about one hour after applying the medication to prevent diluting effect of the medication or moisturize around where you applied the medications. When not using medications, the moisturizer can be continued twice daily as maintenance.  Laundry and clothing: Avoid laundry products with added color or perfumes. Use unscented hypo-allergenic laundry products such as Tide free, Cheer free & gentle, and All free and clear.  If the skin still seems dry or sensitive, you can try double-rinsing the clothes. Avoid tight or scratchy clothing such as wool. Do not use fabric softeners or dyer sheets.

## 2021-02-16 NOTE — Assessment & Plan Note (Signed)
Continue to avoid drugs on your allergy list. 

## 2021-02-22 ENCOUNTER — Other Ambulatory Visit: Payer: Self-pay | Admitting: Internal Medicine

## 2021-03-02 ENCOUNTER — Telehealth: Payer: Self-pay | Admitting: Allergy

## 2021-03-02 NOTE — Telephone Encounter (Signed)
Please ask patient what she has been taking. ?I can send in azelastine and Singulair if needed. ? ?Per last OV: ? ?Use over the counter antihistamines such as Claritin (loratadine), Allegra (fexofenadine), or Xyzal (levocetirizine) daily as needed. May take twice a day during allergy flares. May switch antihistamines every few months. ?Use Flonase (fluticasone) OR Nasonex nasal spray 1 spray per nostril twice a day as needed for nasal congestion.  ?Nasal saline spray (i.e., Simply Saline) or nasal saline lavage (i.e., NeilMed) is recommended as needed and prior to medicated nasal sprays.  ?Use cromolyn 4% 1 drop in each eye up to four times a day as needed for itchy/watery eyes.  ?

## 2021-03-02 NOTE — Telephone Encounter (Signed)
I called the patient to see if she has gotten an over the counter antihistamine, and nasal spray to help with allergy symptoms. I left a message for the patient to call the office back. Please advise on any other medications you would like to send in for allergies.  ?

## 2021-03-02 NOTE — Telephone Encounter (Signed)
Called patient - LMOVM in detail for patient to contact office or message provider via myChart regarding medications she has tried to help with her symptoms. ?

## 2021-03-02 NOTE — Telephone Encounter (Signed)
Patient called and said that her allergy are really bad. Nose running, sneezing, coughing and needs something to take [facility] [phone].   ?

## 2021-03-08 MED ORDER — CROMOLYN SODIUM 4 % OP SOLN: 1.0000 [drp] | Freq: Four times a day (QID) | OPHTHALMIC | 5 refills | Status: DC | PRN

## 2021-03-08 MED ORDER — LEVOCETIRIZINE DIHYDROCHLORIDE 5 MG PO TABS: 5.0000 mg | ORAL_TABLET | Freq: Two times a day (BID) | ORAL | 5 refills | Status: DC | PRN

## 2021-03-08 MED ORDER — FLUTICASONE PROPIONATE 50 MCG/ACT NA SUSP: 1.0000 | Freq: Every day | NASAL | 5 refills | Status: DC

## 2021-03-08 NOTE — Telephone Encounter (Signed)
Called and spoke to patient and she informed me that would like to have prescriptions sent in as she uses her prescription card to pick up her medications.  ?

## 2021-03-08 NOTE — Addendum Note (Signed)
Addended by: Robet Leu A on: 03/08/2021 02:46 PM ? ? Modules accepted: Orders ? ?

## 2021-03-15 ENCOUNTER — Encounter (HOSPITAL_COMMUNITY): Payer: Self-pay | Admitting: Emergency Medicine

## 2021-03-15 NOTE — Progress Notes (Signed)
PA Case: [id number], Status: Closed, Prior Authorization not required for patient/medication, Closed Rationale: [employer] [facility] Prior Authorization team is unable to review this request for a coverage determination as the requested medication is on formulary and does not require a prior authorization. No further action is needed at this time. ? ?No PA needed for Pradaxa 150 mg capsules.  ?

## 2021-06-02 ENCOUNTER — Ambulatory Visit (INDEPENDENT_AMBULATORY_CARE_PROVIDER_SITE_OTHER): Payer: 59 | Admitting: Internal Medicine

## 2021-06-02 ENCOUNTER — Encounter: Payer: Self-pay | Admitting: Internal Medicine

## 2021-06-02 VITALS — BP 132/80 | HR 53 | Temp 98.3°F | Ht 66.0 in | Wt 255.0 lb

## 2021-06-02 DIAGNOSIS — E559 Vitamin D deficiency, unspecified: Secondary | ICD-10-CM

## 2021-06-02 DIAGNOSIS — I10 Essential (primary) hypertension: Secondary | ICD-10-CM | POA: Diagnosis not present

## 2021-06-02 DIAGNOSIS — Z01419 Encounter for gynecological examination (general) (routine) without abnormal findings: Secondary | ICD-10-CM

## 2021-06-02 DIAGNOSIS — R739 Hyperglycemia, unspecified: Secondary | ICD-10-CM

## 2021-06-02 DIAGNOSIS — E538 Deficiency of other specified B group vitamins: Secondary | ICD-10-CM

## 2021-06-02 DIAGNOSIS — E785 Hyperlipidemia, unspecified: Secondary | ICD-10-CM

## 2021-06-02 DIAGNOSIS — Z0001 Encounter for general adult medical examination with abnormal findings: Secondary | ICD-10-CM

## 2021-06-02 DIAGNOSIS — Z1211 Encounter for screening for malignant neoplasm of colon: Secondary | ICD-10-CM | POA: Diagnosis not present

## 2021-06-02 DIAGNOSIS — Z6841 Body Mass Index (BMI) 40.0 and over, adult: Secondary | ICD-10-CM

## 2021-06-02 LAB — HEPATIC FUNCTION PANEL
ALT: 27 U/L (ref 0–35)
AST: 24 U/L (ref 0–37)
Albumin: 4.2 g/dL (ref 3.5–5.2)
Alkaline Phosphatase: 59 U/L (ref 39–117)
Bilirubin, Direct: 0.1 mg/dL (ref 0.0–0.3)
Total Bilirubin: 0.5 mg/dL (ref 0.2–1.2)
Total Protein: 7.5 g/dL (ref 6.0–8.3)

## 2021-06-02 LAB — BASIC METABOLIC PANEL
BUN: 10 mg/dL (ref 6–23)
CO2: 29 mEq/L (ref 19–32)
Calcium: 9.4 mg/dL (ref 8.4–10.5)
Chloride: 104 mEq/L (ref 96–112)
Creatinine, Ser: 0.63 mg/dL (ref 0.40–1.20)
GFR: 103.46 mL/min (ref >60.00)
Glucose, Bld: 95 mg/dL (ref 70–99)
Potassium: 4.2 mEq/L (ref 3.5–5.1)
Sodium: 140 mEq/L (ref 135–145)

## 2021-06-02 LAB — CBC WITH DIFFERENTIAL/PLATELET
Basophils Absolute: 0 10*3/uL (ref 0.0–0.1)
Basophils Relative: 0.5 % (ref 0.0–3.0)
Eosinophils Absolute: 0.5 10*3/uL (ref 0.0–0.7)
Eosinophils Relative: 11 % — ABNORMAL HIGH (ref 0.0–5.0)
HCT: 36 % (ref 36.0–46.0)
Hemoglobin: 11.7 g/dL — ABNORMAL LOW (ref 12.0–15.0)
Lymphocytes Relative: 43.2 % (ref 12.0–46.0)
Lymphs Abs: 1.9 10*3/uL (ref 0.7–4.0)
MCHC: 32.5 g/dL (ref 30.0–36.0)
MCV: 83.9 fl (ref 78.0–100.0)
Monocytes Absolute: 0.3 10*3/uL (ref 0.1–1.0)
Monocytes Relative: 8 % (ref 3.0–12.0)
Neutro Abs: 1.6 10*3/uL (ref 1.4–7.7)
Neutrophils Relative %: 37.3 % — ABNORMAL LOW (ref 43.0–77.0)
Platelets: 163 10*3/uL (ref 150.0–400.0)
RBC: 4.28 Mil/uL (ref 3.87–5.11)
RDW: 15.1 % (ref 11.5–15.5)
WBC: 4.3 10*3/uL (ref 4.0–10.5)

## 2021-06-02 LAB — URINALYSIS, ROUTINE W REFLEX MICROSCOPIC
Bilirubin Urine: NEGATIVE
Hgb urine dipstick: NEGATIVE
Ketones, ur: NEGATIVE
Nitrite: NEGATIVE
RBC / HPF: NONE SEEN (ref 0–?)
Specific Gravity, Urine: 1.01 (ref 1.000–1.030)
Total Protein, Urine: NEGATIVE
Urine Glucose: NEGATIVE
Urobilinogen, UA: 0.2 (ref 0.0–1.0)
pH: 7 (ref 5.0–8.0)

## 2021-06-02 LAB — MICROALBUMIN / CREATININE URINE RATIO
Creatinine,U: 56.1 mg/dL
Microalb Creat Ratio: 1.2 mg/g (ref 0.0–30.0)
Microalb, Ur: <0.7 mg/dL (ref 0.0–1.9)

## 2021-06-02 LAB — HEMOGLOBIN A1C: Hgb A1c MFr Bld: 6.6 % — ABNORMAL HIGH (ref 4.6–6.5)

## 2021-06-02 LAB — LIPID PANEL
Cholesterol: 186 mg/dL (ref 0–200)
HDL: 47.3 mg/dL (ref >39.00)
LDL Cholesterol: 118 mg/dL — ABNORMAL HIGH (ref 0–99)
NonHDL: 138.54
Total CHOL/HDL Ratio: 4
Triglycerides: 104 mg/dL (ref 0.0–149.0)
VLDL: 20.8 mg/dL (ref 0.0–40.0)

## 2021-06-02 LAB — VITAMIN B12: Vitamin B-12: 294 pg/mL (ref 211–911)

## 2021-06-02 LAB — TSH: TSH: 2.14 u[IU]/mL (ref 0.35–5.50)

## 2021-06-02 LAB — VITAMIN D 25 HYDROXY (VIT D DEFICIENCY, FRACTURES): VITD: 8.37 ng/mL — ABNORMAL LOW (ref 30.00–100.00)

## 2021-06-02 MED ORDER — PHENTERMINE HCL 37.5 MG PO CAPS: 37.5000 mg | ORAL_CAPSULE | ORAL | 2 refills | Status: DC

## 2021-06-02 NOTE — Progress Notes (Signed)
Patient ID: Hannah Thompson, female   DOB: [birth year], 51 y.o.   MRN: [id number]         Chief Complaint:: wellness exam and obesity, low vit d, htn, hld, hyperglycemia, b12 deficiency       HPI:  Hannah Thompson is a 51 y.o. female here for wellness exam; needs new GYN for pap and mammogram; due for colonoscopy now, declines covid booster, shingrix, o/w up to date                        Also difficult to lose wt with diet and exercise, just seems to continue to gain.  Not sure if wegovy is covered with insurance, asks for phentermine.  Pt denies chest pain, increased sob or doe, wheezing, orthopnea, PND, increased LE swelling, palpitations, dizziness or syncope.   Pt denies polydipsia, polyuria, or new focal neuro s/s.    Pt denies fever, wt loss, night sweats, loss of appetite, or other constitutional symptoms  Not taking Vit D.     Wt Readings from Last 3 Encounters:  06/02/21 255 lb (115.7 kg)  02/16/21 251 lb 12.8 oz (114.2 kg)  12/02/20 251 lb (113.9 kg)   BP Readings from Last 3 Encounters:  06/02/21 132/80  02/16/21 124/86  12/02/20 128/86   Immunization History  Administered Date(s) Administered   Influenza,inj,Quad PF,6+ Mos 10/18/2016, 11/07/2019, 11/29/2020   PFIZER(Purple Top)SARS-COV-2 Vaccination 04/03/2019, 05/05/2019   Tdap 12/19/2013   Health Maintenance Due  Topic Date Due   PAP SMEAR-Modifier  10/02/2015   COLONOSCOPY (Pts 45-20yrs Insurance coverage will need to be confirmed)  Never done      Past Medical History:  Diagnosis Date   ALLERGIC RHINITIS 03/17/2009   Anemia    ASTHMA 03/17/2009   DVT (deep venous thrombosis) (HCC)    ECZEMA 03/17/2009   Headache(784.0) 06/21/2009   recurrent   HYPERLIPIDEMIA 03/17/2009   KELOID 06/21/2009   Pulmonary emboli (HCC)    VITAMIN B12 DEFICIENCY 03/18/2009   Past Surgical History:  Procedure Laterality Date   KELOID EXCISION  mid 1990's   s/p keloid removal-laser     reports that she quit smoking about 13 years ago.  Her smoking use included cigarettes. She started smoking about 17 years ago. She has a 1.00 pack-year smoking history. She has never been exposed to tobacco smoke. She has never used smokeless tobacco. She reports current alcohol use. She reports that she does not use drugs. family history includes Alcohol abuse in an other family member; Allergies in her sister; Angioedema in her sister; Cancer in her father, paternal aunt, and another family member; Deep vein thrombosis in her sister; Diabetes in her mother and another family member; Eczema in her sister; Heart disease in an other family member; Hypertension in her father and mother. Allergies  Allergen Reactions   Avocado Nausea And Vomiting    Projectile N/V   Fish Allergy Anaphylaxis and Swelling    Pollock.Swelling of lips, throat and face.   Oxaprozin     angioedema   Sulfonamide Derivatives     REACTION: hives   Latex Rash   Nsaids Rash   Orange Juice [Orange Oil] Hives    Orange juice   Tomato Hives    tomatoes   Zyrtec [Cetirizine] Hives and Rash   Current Outpatient Medications on File Prior to Visit  Medication Sig Dispense Refill   cromolyn (OPTICROM) 4 % ophthalmic solution Place 1 drop into both eyes 4 (  four) times daily as needed (itchy/watery eyes). 10 mL 5   dabigatran (PRADAXA) 150 MG CAPS capsule TAKE 1 CAPSULE BY MOUTH EVERY 12 HOURS 60 capsule 0   EPINEPHrine 0.3 mg/0.3 mL IJ SOAJ injection Inject 0.3 mg into the muscle as needed for anaphylaxis. 1 each 2   fluticasone (FLONASE) 50 MCG/ACT nasal spray Place 1 spray into both nostrils daily. 16 g 5   levocetirizine (XYZAL) 5 MG tablet Take 1 tablet (5 mg total) by mouth 2 (two) times daily as needed for allergies (Can take an extra dose during flare ups.). 60 tablet 5   triamcinolone ointment (KENALOG) 0.1 % Apply 1 application topically 2 (two) times daily as needed (rash flare). Do not use on the face, neck, armpits or groin area. Do not use more than 3 weeks in a  row. 30 g 1   No current facility-administered medications on file prior to visit.        ROS:  All others reviewed and negative.  Objective        PE:  BP 132/80 (BP Location: Right Arm, Patient Position: Sitting, Cuff Size: Large)   Pulse (!) 53   Temp 98.3 F (36.8 C) (Oral)   Ht 5\' 6"  (1.676 m)   Wt 255 lb (115.7 kg)   SpO2 97%   BMI 41.16 kg/m                 Constitutional: Pt appears in NAD               HENT: Head: NCAT.                Right Ear: External ear normal.                 Left Ear: External ear normal.                Eyes: . Pupils are equal, round, and reactive to light. Conjunctivae and EOM are normal               Nose: without d/c or deformity               Neck: Neck supple. Gross normal ROM               Cardiovascular: Normal rate and regular rhythm.                 Pulmonary/Chest: Effort normal and breath sounds without rales or wheezing.                Abd:  Soft, NT, ND, + BS, no organomegaly               Neurological: Pt is alert. At baseline orientation, motor grossly intact               Skin: Skin is warm. No rashes, no other new lesions, LE edema - none               Psychiatric: Pt behavior is normal without agitation   Micro: none  Cardiac tracings I have personally interpreted today:  none  Pertinent Radiological findings (summarize): none   Lab Results  Component Value Date   WBC 4.3 06/02/2021   HGB 11.7 (L) 06/02/2021   HCT 36.0 06/02/2021   PLT 163.0 06/02/2021   GLUCOSE 95 06/02/2021   CHOL 186 06/02/2021   TRIG 104.0 06/02/2021   HDL 47.30 06/02/2021   LDLCALC 118 (H) 06/02/2021   ALT  27 06/02/2021   AST 24 06/02/2021   NA 140 06/02/2021   K 4.2 06/02/2021   CL 104 06/02/2021   CREATININE 0.63 06/02/2021   BUN 10 06/02/2021   CO2 29 06/02/2021   TSH 2.14 06/02/2021   INR 1.01 12/09/2017   HGBA1C 6.6 (H) 06/02/2021   MICROALBUR <0.7 06/02/2021   Assessment/Plan:  Hannah Thompson is a 51 y.o. Black or African  American [2] female with  has a past medical history of ALLERGIC RHINITIS (03/17/2009), Anemia, ASTHMA (03/17/2009), DVT (deep venous thrombosis) (HCC), ECZEMA (03/17/2009), Headache(784.0) (06/21/2009), HYPERLIPIDEMIA (03/17/2009), KELOID (06/21/2009), Pulmonary emboli (HCC), and VITAMIN B12 DEFICIENCY (03/18/2009).  Vitamin D deficiency Last vitamin D Lab Results  Component Value Date   VD25OH 8.37 (L) 06/02/2021   Low, to start oral replacement   Encounter for well adult exam with abnormal findings Age and sex appropriate education and counseling updated with regular exercise and diet Referrals for preventative services - for colonoscopy, also refer GYN for pap and mammogram Immunizations addressed - declines covid booster, shingrix Smoking counseling  - none needed Evidence for depression or other mood disorder - none significant Most recent labs reviewed. I have personally reviewed and have noted: 1) the patient's medical and social history 2) The patient's current medications and supplements 3) The patient's height, weight, and BMI have been recorded in the chart   Hypertension BP Readings from Last 3 Encounters:  06/02/21 132/80  02/16/21 124/86  12/02/20 128/86   Stable, pt to continue medical treatment  - diet, wt control, low salt   Hyperlipidemia Lab Results  Component Value Date   LDLCALC 118 (H) 06/02/2021   Uncontrolled, goal ldl < 100, pt to continue current low chol diet, declines statin  Hyperglycemia Lab Results  Component Value Date   HGBA1C 6.6 (H) 06/02/2021   Mild uncontrolled, pt to continue current medical treatment  - diet, exercise, declines ozempic for now  B12 deficiency Lab Results  Component Value Date   VITAMINB12 294 06/02/2021   Stable, cont oral replacement - b12 1000 mcg qd   Obesity Uncontrolled, ok for phentermine, but ideally would be appropriate for ozempic or rybelsus, will try  To further address next visit  Followup: Return  in about 1 year (around 06/03/2022).  Oliver Barre, MD 06/05/2021 1:20 PM [facility]

## 2021-06-02 NOTE — Patient Instructions (Addendum)
You will be contacted regarding the referral for: gyn referral and colonoscopy  Please continue all other medications as before, and refills have been done if requested - the phentermine  Please have the pharmacy call with any other refills you may need.  Please continue your efforts at being more active, low cholesterol diet, and weight control.  You are otherwise up to date with prevention measures today.  Please keep your appointments with your specialists as you may have planned  Please go to the LAB at the blood drawing area for the tests to be done  You will be contacted by phone if any changes need to be made immediately.  Otherwise, you will receive a letter about your results with an explanation, but please check with MyChart first.  Please remember to sign up for MyChart if you have not done so, as this will be important to you in the future with finding out test results, communicating by private email, and scheduling acute appointments online when needed.  Please make an Appointment to return for your 1 year visit, or sooner if needed, with Lab testing by Appointment as well, to be done about 3-5 days before at the FIRST FLOOR Lab (so this is for TWO appointments - please see the scheduling desk as you leave)   Due to the ongoing Covid 19 pandemic, our lab now requires an appointment for any labs done at our office.  If you need labs done and do not have an appointment, please call our office ahead of time to schedule before presenting to the lab for your testing.

## 2021-06-05 ENCOUNTER — Encounter: Payer: Self-pay | Admitting: Internal Medicine

## 2021-06-05 NOTE — Assessment & Plan Note (Signed)
Last vitamin D Lab Results  Component Value Date   VD25OH 8.37 (L) 06/02/2021   Low, to start oral replacement

## 2021-06-05 NOTE — Assessment & Plan Note (Signed)
BP Readings from Last 3 Encounters:  06/02/21 132/80  02/16/21 124/86  12/02/20 128/86   Stable, pt to continue medical treatment  - diet, wt control, low salt

## 2021-06-05 NOTE — Assessment & Plan Note (Signed)
Age and sex appropriate education and counseling updated with regular exercise and diet Referrals for preventative services - for colonoscopy, also refer GYN for pap and mammogram Immunizations addressed - declines covid booster, shingrix Smoking counseling  - none needed Evidence for depression or other mood disorder - none significant Most recent labs reviewed. I have personally reviewed and have noted: 1) the patient's medical and social history 2) The patient's current medications and supplements 3) The patient's height, weight, and BMI have been recorded in the chart

## 2021-06-05 NOTE — Assessment & Plan Note (Signed)
Lab Results  Component Value Date   VITAMINB12 294 06/02/2021   Stable, cont oral replacement - b12 1000 mcg qd

## 2021-06-05 NOTE — Assessment & Plan Note (Signed)
Uncontrolled, ok for phentermine, but ideally would be appropriate for ozempic or rybelsus, will try  To further address next visit

## 2021-06-05 NOTE — Assessment & Plan Note (Signed)
Lab Results  Component Value Date   HGBA1C 6.6 (H) 06/02/2021   Mild uncontrolled, pt to continue current medical treatment  - diet, exercise, declines ozempic for now

## 2021-06-05 NOTE — Assessment & Plan Note (Signed)
Lab Results  Component Value Date   LDLCALC 118 (H) 06/02/2021   Uncontrolled, goal ldl < 100, pt to continue current low chol diet, declines statin

## 2021-06-13 ENCOUNTER — Telehealth: Payer: Self-pay | Admitting: *Deleted

## 2021-06-13 NOTE — Telephone Encounter (Signed)
Pt was on cover-my-meds need PA for phentermine. Complete via cover-my-meds w/ (Key: [id number]). Rec'd msg stating [employer] Rx Prior Authorization Team is unable to review this request for prior authorization as there is a closed record on file with duplicate information, Case ID: [id number]. This case was closed on 06/06/2021 because the requested medication is not on the patient's formulary....Raechel Chute

## 2021-08-15 ENCOUNTER — Telehealth: Payer: Self-pay

## 2021-08-15 MED ORDER — LEVOCETIRIZINE DIHYDROCHLORIDE 5 MG PO TABS: 5.0000 mg | ORAL_TABLET | Freq: Two times a day (BID) | ORAL | 5 refills | Status: DC | PRN

## 2021-08-15 MED ORDER — FLUTICASONE PROPIONATE 50 MCG/ACT NA SUSP: 1.0000 | Freq: Every day | NASAL | 5 refills | Status: DC

## 2021-08-15 MED ORDER — CROMOLYN SODIUM 4 % OP SOLN: 1.0000 [drp] | Freq: Four times a day (QID) | OPHTHALMIC | 1 refills | Status: DC | PRN

## 2021-08-15 NOTE — Telephone Encounter (Signed)
States she had coughing, sneezing, watery eyes, and sneezing. States that she was cleaning her house during this attack. Patient stated that she used a mask last night while cleaning and she got some relief. Patient stated that she will continue previous suggested regimen until next visit and discuss other options if this doesn't help.

## 2021-08-15 NOTE — Telephone Encounter (Signed)
Can you clarify what kind of symptoms she had?  I see she has an appointment with me next week. Keep the appointment as she is due for follow up.   As per the last OV note Use over the counter antihistamines such as Claritin (loratadine), Allegra (fexofenadine), or Xyzal (levocetirizine) daily as needed. May take twice a day during allergy flares. May switch antihistamines every few months. Use Flonase (fluticasone) OR Nasonex nasal spray 1 spray per nostril twice a day as needed for nasal congestion.  Nasal saline spray (i.e., Simply Saline) or nasal saline lavage (i.e., NeilMed) is recommended as needed and prior to medicated nasal sprays.  Use cromolyn 4% 1 drop in each eye up to four times a day as needed for itchy/watery eyes.

## 2021-08-15 NOTE — Addendum Note (Signed)
Addended by: Robet Leu A on: 08/15/2021 11:00 AM   Modules accepted: Orders

## 2021-08-15 NOTE — Telephone Encounter (Signed)
Spoke to patient and she informed me that she had a bad allergy attack yesterday. Patient stated that she didn't have her medications. I have sent in refills of her medications.  Please advise if something else should be done as she had her allergy attack yesterday.  Thank you!

## 2021-08-24 ENCOUNTER — Ambulatory Visit: Payer: Medicaid Other | Admitting: Allergy

## 2021-08-24 NOTE — Progress Notes (Unsigned)
Follow Up Note  RE: [name] MRN: [id number] DOB: 1970-06-22 Date of Office Visit: 08/25/2021  Referring provider: Corwin Levins, MD Primary care provider: Corwin Levins, MD  Chief Complaint: No chief complaint on file.  History of Present Illness: I had the pleasure of seeing Hannah Thompson for a follow up visit at the [facility] on 08/24/2021. She is a 51 y.o. female, who is being followed for allergic rhinoconjunctivitis, adverse food reaction, atopic dermatitis and multiple drug allergies. Her previous allergy office visit was on 02/16/2021 with Dr. Selena Batten. Today is a regular follow up visit.  Seasonal and perennial allergic rhinoconjunctivitis Past history - Perennial rhinoconjunctivitis symptoms for 40+ years with worsening from spring through fall.  Zyrtec caused a rash and swelling.  Skin testing over 20 years ago showed multiple positives per patient report and was on allergy immunotherapy for a few years with good benefit. 2022 skin testing showed: Positive to grass, weed pollen, ragweed, trees, mold, dust mites, cat, feathers, cockroach. Interim history - only using meds prn with good benefit.  Continue environmental control measures as below. Use over the counter antihistamines such as Claritin (loratadine), Allegra (fexofenadine), or Xyzal (levocetirizine) daily as needed. May take twice a day during allergy flares. May switch antihistamines every few months. Use Flonase (fluticasone) OR Nasonex nasal spray 1 spray per nostril twice a day as needed for nasal congestion.  Nasal saline spray (i.e., Simply Saline) or nasal saline lavage (i.e., NeilMed) is recommended as needed and prior to medicated nasal sprays.  Use cromolyn 4% 1 drop in each eye up to four times a day as needed for itchy/watery eyes.    Other adverse food reactions, not elsewhere classified, subsequent encounter Past history - Fresh tomatoes cause facial pruritus/? Tongue swelling. Tolerates  processed tomatoes. Coconut causes contact rash and perioral pruritus. Melons/oranges cause perioral pruritus. Avocados caused vomiting/pruritus. Pollock caused nausea, pruritus and eye swelling. Eats other seafood with no issues. 2022 skin testing showed: Borderline positive to soy, hazelnut, pistachio, orange, cantaloupe and watermelon. Interim history - no reactions.  Continue to avoid foods that are bothersome - melons, oranges, coconut, fresh tomatoes, avocado, pollock. For mild symptoms you can take over the counter antihistamines such as Benadryl and monitor symptoms closely. If symptoms worsen or if you have severe symptoms including breathing issues, throat closure, significant swelling, whole body hives, severe diarrhea and vomiting, lightheadedness then inject epinephrine and seek immediate medical care afterwards. Action plan in place.    Other atopic dermatitis Continue proper skin care. Use triamcinolone 0.1% ointment twice a day as needed for rash flares. Do not use on the face, neck, armpits or groin area. Do not use more than 3 weeks in a row   Multiple drug allergies Continue to avoid drugs on your allergy list.   Assessment and Plan: Hannah Thompson is a 51 y.o. female with: No problem-specific Assessment & Plan notes found for this encounter.  No follow-ups on file.  No orders of the defined types were placed in this encounter.  Lab Orders  No laboratory test(s) ordered today    Diagnostics: Spirometry:  Tracings reviewed. Her effort: {Blank single:19197::"Good reproducible efforts.","It was hard to get consistent efforts and there is a question as to whether this reflects a maximal maneuver.","Poor effort, data can not be interpreted."} FVC: ***L FEV1: ***L, ***% predicted FEV1/FVC ratio: ***% Interpretation: {Blank single:19197::"Spirometry consistent with mild obstructive disease","Spirometry consistent with moderate obstructive disease","Spirometry consistent with severe  obstructive  disease","Spirometry consistent with possible restrictive disease","Spirometry consistent with mixed obstructive and restrictive disease","Spirometry uninterpretable due to technique","Spirometry consistent with normal pattern","No overt abnormalities noted given today's efforts"}.  Please see scanned spirometry results for details.  Skin Testing: {Blank single:19197::"Select foods","Environmental allergy panel","Environmental allergy panel and select foods","Food allergy panel","None","Deferred due to recent antihistamines use"}. *** Results discussed with patient/family.   Medication List:  Current Outpatient Medications  Medication Sig Dispense Refill   cromolyn (OPTICROM) 4 % ophthalmic solution Place 1 drop into both eyes 4 (four) times daily as needed (itchy/watery eyes). 10 mL 1   dabigatran (PRADAXA) 150 MG CAPS capsule TAKE 1 CAPSULE BY MOUTH EVERY 12 HOURS 60 capsule 0   EPINEPHrine 0.3 mg/0.3 mL IJ SOAJ injection Inject 0.3 mg into the muscle as needed for anaphylaxis. 1 each 2   fluticasone (FLONASE) 50 MCG/ACT nasal spray Place 1 spray into both nostrils daily. 16 g 5   levocetirizine (XYZAL) 5 MG tablet Take 1 tablet (5 mg total) by mouth 2 (two) times daily as needed for allergies (Can take an extra dose during flare ups.). 60 tablet 5   phentermine 37.5 MG capsule Take 1 capsule (37.5 mg total) by mouth every morning. 30 capsule 2   triamcinolone ointment (KENALOG) 0.1 % Apply 1 application topically 2 (two) times daily as needed (rash flare). Do not use on the face, neck, armpits or groin area. Do not use more than 3 weeks in a row. 30 g 1   No current facility-administered medications for this visit.   Allergies: Allergies  Allergen Reactions   Avocado Nausea And Vomiting    Projectile N/V   Fish Allergy Anaphylaxis and Swelling    Pollock.Swelling of lips, throat and face.   Oxaprozin     angioedema   Sulfonamide Derivatives     REACTION: hives   Latex  Rash   Nsaids Rash   Orange Juice [Orange Oil] Hives    Orange juice   Tomato Hives    tomatoes   Zyrtec [Cetirizine] Hives and Rash   I reviewed her past medical history, social history, family history, and environmental history and no significant changes have been reported from her previous visit.  Review of Systems  Constitutional:  Negative for appetite change, chills, fever and unexpected weight change.  HENT:  Negative for congestion and rhinorrhea.   Eyes:  Negative for itching.  Respiratory:  Negative for cough, chest tightness, shortness of breath and wheezing.   Cardiovascular:  Negative for chest pain.  Gastrointestinal:  Negative for abdominal pain.  Genitourinary:  Negative for difficulty urinating.  Skin:  Negative for rash.  Allergic/Immunologic: Positive for environmental allergies and food allergies.  Neurological:  Negative for headaches.    Objective: There were no vitals taken for this visit. There is no height or weight on file to calculate BMI. Physical Exam Vitals and nursing note reviewed.  Constitutional:      Appearance: Normal appearance. She is well-developed.  HENT:     Head: Normocephalic and atraumatic.     Right Ear: Tympanic membrane and external ear normal.     Left Ear: Tympanic membrane and external ear normal.     Nose: Nose normal.     Mouth/Throat:     Mouth: Mucous membranes are moist.     Pharynx: Oropharynx is clear.  Eyes:     Conjunctiva/sclera: Conjunctivae normal.  Cardiovascular:     Rate and Rhythm: Normal rate and regular rhythm.     Heart sounds: Normal  heart sounds. No murmur heard.    No friction rub. No gallop.  Pulmonary:     Effort: Pulmonary effort is normal.     Breath sounds: Normal breath sounds. No wheezing, rhonchi or rales.  Abdominal:     Palpations: Abdomen is soft.  Musculoskeletal:     Cervical back: Neck supple.  Skin:    General: Skin is warm.     Findings: No rash.  Neurological:     Mental  Status: She is alert and oriented to person, place, and time.    Previous notes and tests were reviewed. The plan was reviewed with the patient/family, and all questions/concerned were addressed.  It was my pleasure to see Hannah Thompson today and participate in her care. Please feel free to contact me with any questions or concerns.  Sincerely,  Wyline Mood, DO Allergy & Immunology  [facility] office: [phone] [facility] office: [phone]

## 2021-08-25 ENCOUNTER — Ambulatory Visit (INDEPENDENT_AMBULATORY_CARE_PROVIDER_SITE_OTHER): Payer: Commercial Managed Care - HMO | Admitting: Allergy

## 2021-08-25 ENCOUNTER — Encounter: Payer: Self-pay | Admitting: Allergy

## 2021-08-25 VITALS — BP 112/70 | HR 55 | Temp 97.9°F | Resp 160 | Ht 67.0 in | Wt 255.0 lb

## 2021-08-25 DIAGNOSIS — H1013 Acute atopic conjunctivitis, bilateral: Secondary | ICD-10-CM

## 2021-08-25 DIAGNOSIS — L2089 Other atopic dermatitis: Secondary | ICD-10-CM

## 2021-08-25 DIAGNOSIS — J302 Other seasonal allergic rhinitis: Secondary | ICD-10-CM | POA: Diagnosis not present

## 2021-08-25 DIAGNOSIS — T781XXD Other adverse food reactions, not elsewhere classified, subsequent encounter: Secondary | ICD-10-CM

## 2021-08-25 DIAGNOSIS — H1010 Acute atopic conjunctivitis, unspecified eye: Secondary | ICD-10-CM

## 2021-08-25 DIAGNOSIS — Z889 Allergy status to unspecified drugs, medicaments and biological substances status: Secondary | ICD-10-CM

## 2021-08-25 MED ORDER — MONTELUKAST SODIUM 10 MG PO TABS: 10.0000 mg | ORAL_TABLET | Freq: Every evening | ORAL | 5 refills | Status: DC | PRN

## 2021-08-25 NOTE — Assessment & Plan Note (Signed)
Continue to avoid drugs on allergy list.  

## 2021-08-25 NOTE — Assessment & Plan Note (Signed)
Past history - Perennial rhinoconjunctivitis symptoms for 40+ years with worsening from spring through fall.  Zyrtec caused a rash and swelling.  Skin testing over 20 years ago showed multiple positives per patient report and was on allergy immunotherapy for a few years with good benefit. 2022 skin testing showed: Positive to grass, weed pollen, ragweed, trees, mold, dust mites, cat, feathers, cockroach. Interim history - flares when cleaning. Can't commit to AIT.    Continue environmental control measures.  Wear mask when cleaning.   Use over the counter antihistamines such as Claritin (loratadine), Allegra (fexofenadine), or Xyzal (levocetirizine) daily as needed. May take twice a day during allergy flares. May switch antihistamines every few months.  Use Flonase (fluticasone) OR Nasonex nasal spray 1 spray per nostril twice a day as needed for nasal congestion.   Nasal saline spray (i.e., Simply Saline) or nasal saline lavage (i.e., NeilMed) is recommended as needed and prior to medicated nasal sprays.  Use cromolyn 4% 1 drop in each eye up to four times a day as needed for itchy/watery eyes.   During an allergy flare you may:  Start Singulair (montelukast) 10mg  daily at night.  Cautioned that in some children/adults can experience behavioral changes including hyperactivity, agitation, depression, sleep disturbances and suicidal ideations. These side effects are rare, but if you notice them you should notify me and discontinue Singulair (montelukast).

## 2021-08-25 NOTE — Assessment & Plan Note (Signed)
Sometimes breaking out on her arms and chest with no triggers. Triamcinolone helps.   Continue proper skin care. . Use triamcinolone 0.1% ointment twice a day as needed for rash flares. Do not use on the face, neck, armpits or groin area. Do not use more than 3 weeks in a row

## 2021-08-25 NOTE — Patient Instructions (Addendum)
Environmental allergies: 2022 skin testing showed: Positive to grass, weed pollen, ragweed, trees, mold, dust mites, cat, feathers, cockroach. Continue environmental control measures. Use over the counter antihistamines such as Claritin (loratadine), Allegra (fexofenadine), or Xyzal (levocetirizine) daily as needed. May take twice a day during allergy flares. May switch antihistamines every few months. Use Flonase (fluticasone) OR Nasonex nasal spray 1 spray per nostril twice a day as needed for nasal congestion.  Nasal saline spray (i.e., Simply Saline) or nasal saline lavage (i.e., NeilMed) is recommended as needed and prior to medicated nasal sprays. Use cromolyn 4% 1 drop in each eye up to four times a day as needed for itchy/watery eyes.  During an allergy flare you may: Start Singulair (montelukast) 10mg  daily at night. Cautioned that in some children/adults can experience behavioral changes including hyperactivity, agitation, depression, sleep disturbances and suicidal ideations. These side effects are rare, but if you notice them you should notify me and discontinue Singulair (montelukast).  Food: Continue to avoid foods that are bothersome - melons, oranges, coconut, fresh tomatoes, avocado, pollock. For mild symptoms you can take over the counter antihistamines such as Benadryl and monitor symptoms closely. If symptoms worsen or if you have severe symptoms including breathing issues, throat closure, significant swelling, whole body hives, severe diarrhea and vomiting, lightheadedness then inject epinephrine and seek immediate medical care afterwards. Action plan in place.   Drugs: Continue to avoid drugs on your allergy list.   Skin: Continue proper skin care. Use triamcinolone 0.1% ointment twice a day as needed for rash flares. Do not use on the face, neck, armpits or groin area. Do not use more than 3 weeks in a row.   Follow up in 6 months or sooner if needed.

## 2021-08-25 NOTE — Assessment & Plan Note (Signed)
Past history - Fresh tomatoes cause facial pruritus/? Tongue swelling. Tolerates processed tomatoes. Coconut causes contact rash and perioral pruritus. Melons/oranges cause perioral pruritus. Avocados caused vomiting/pruritus. Pollock caused nausea, pruritus and eye swelling. Eats other seafood with no issues. 2022 skin testing showed: Borderline positive to soy, hazelnut, pistachio, orange, cantaloupe and watermelon. Interim history - no reactions.  Continue to avoid foods that are bothersome - melons, oranges, coconut, fresh tomatoes, avocado, pollock. For mild symptoms you can take over the counter antihistamines such as Benadryl and monitor symptoms closely. If symptoms worsen or if you have severe symptoms including breathing issues, throat closure, significant swelling, whole body hives, severe diarrhea and vomiting, lightheadedness then inject epinephrine and seek immediate medical care afterwards. Action plan in place.  

## 2021-11-30 ENCOUNTER — Encounter (HOSPITAL_COMMUNITY): Payer: Self-pay

## 2021-11-30 ENCOUNTER — Encounter: Payer: Self-pay | Admitting: Internal Medicine

## 2021-11-30 ENCOUNTER — Ambulatory Visit (INDEPENDENT_AMBULATORY_CARE_PROVIDER_SITE_OTHER): Payer: Commercial Managed Care - HMO | Admitting: Internal Medicine

## 2021-11-30 ENCOUNTER — Ambulatory Visit (HOSPITAL_COMMUNITY)
Admission: EM | Admit: 2021-11-30 | Discharge: 2021-11-30 | Disposition: A | Discharge status: Home / Self Care | Payer: Commercial Managed Care - HMO | Attending: Family Medicine | Admitting: Family Medicine

## 2021-11-30 VITALS — BP 126/78 | HR 85 | Temp 99.1°F | Ht 67.0 in | Wt 255.0 lb

## 2021-11-30 DIAGNOSIS — L0231 Cutaneous abscess of buttock: Secondary | ICD-10-CM

## 2021-11-30 DIAGNOSIS — R739 Hyperglycemia, unspecified: Secondary | ICD-10-CM

## 2021-11-30 DIAGNOSIS — I10 Essential (primary) hypertension: Secondary | ICD-10-CM | POA: Diagnosis not present

## 2021-11-30 DIAGNOSIS — E559 Vitamin D deficiency, unspecified: Secondary | ICD-10-CM | POA: Diagnosis not present

## 2021-11-30 MED ORDER — DOXYCYCLINE HYCLATE 100 MG PO TABS: 100.0000 mg | ORAL_TABLET | Freq: Two times a day (BID) | ORAL | 0 refills | Status: DC

## 2021-11-30 MED ORDER — HYDROCODONE-ACETAMINOPHEN 5-325 MG PO TABS: 1.0000 | ORAL_TABLET | Freq: Four times a day (QID) | ORAL | 0 refills | Status: DC | PRN

## 2021-11-30 MED ORDER — LIDOCAINE HCL 2 % IJ SOLN
INTRAMUSCULAR | Status: AC
  Filled 2021-11-30: qty 20

## 2021-11-30 MED ORDER — BUPIVACAINE HCL (PF) 0.5 % IJ SOLN: 10.0000 mL | Freq: Once | INTRAMUSCULAR | Status: DC

## 2021-11-30 MED ORDER — KETOROLAC TROMETHAMINE 30 MG/ML IJ SOLN
30.0000 mg | Freq: Once | INTRAMUSCULAR | Status: AC
  Administered 2021-11-30: 30 mg via INTRAVENOUS

## 2021-11-30 NOTE — Assessment & Plan Note (Signed)
BP Readings from Last 3 Encounters:  11/30/21 117/80  11/30/21 126/78  08/25/21 112/70   Stable, pt to continue medical treatment   - diet, low salt, excercise

## 2021-11-30 NOTE — Assessment & Plan Note (Signed)
Last vitamin D Lab Results  Component Value Date   VD25OH 8.37 (L) 06/02/2021   Low, reminded to start oral replacement

## 2021-11-30 NOTE — ED Triage Notes (Signed)
Chief Complaint: boil on the buttocks. States it right at the top of the crack of her bottom. Has never had an abscess before. States it is very painful. Has not drained.   Onset: Sunday  OTC medications tried: Yes- boil ease, hot compress    with no relief

## 2021-11-30 NOTE — Patient Instructions (Addendum)
You had the pain shot today (toradol 30 mg)  Please take all new medication as prescribed - the pain medication, and antibiotic  Please call in 2 -3 days if not improved to see General [facility]  Please continue all other medications as before, and refills have been done if requested.  Please have the pharmacy call with any other refills you may need.  Please keep your appointments with your specialists as you may have planned

## 2021-11-30 NOTE — Discharge Instructions (Signed)
Continue taking the doxycycline and hydrocodone prescribed by your primary care doctor today.

## 2021-11-30 NOTE — Progress Notes (Signed)
Patient ID: Hannah Thompson, female   DOB: 1970/02/24, 51 y.o.   MRN: [id number]        Chief Complaint: follow up left buttock pain and swelling, htn, hyperglyemia, low vit d       HPI:  Hannah Thompson is a 51 y.o. female here with c/o 3 days onset pain, redness swelling and induration to the left upper buttock area near the natal cleft  Has hx of prior abscess and boils but this one seems larger and more painful.  No high fever, chills, drainage.  Pt denies chest pain, increased sob or doe, wheezing, orthopnea, PND, increased LE swelling, palpitations, dizziness or syncope.   Pt denies polydipsia, polyuria, or new focal neuro s/s.  Pt states not allergic to nsaids  Wt Readings from Last 3 Encounters:  11/30/21 255 lb (115.7 kg)  11/30/21 255 lb (115.7 kg)  08/25/21 255 lb (115.7 kg)   BP Readings from Last 3 Encounters:  11/30/21 117/80  11/30/21 126/78  08/25/21 112/70         Past Medical History:  Diagnosis Date   ALLERGIC RHINITIS 03/17/2009   Anemia    ASTHMA 03/17/2009   DVT (deep venous thrombosis) (HCC)    ECZEMA 03/17/2009   Headache(784.0) 06/21/2009   recurrent   HYPERLIPIDEMIA 03/17/2009   KELOID 06/21/2009   Pulmonary emboli (HCC)    VITAMIN B12 DEFICIENCY 03/18/2009   Past Surgical History:  Procedure Laterality Date   KELOID EXCISION  mid 1990's   s/p keloid removal-laser     reports that she quit smoking about 13 years ago. Her smoking use included cigarettes. She started smoking about 17 years ago. She has a 1.00 pack-year smoking history. She has never been exposed to tobacco smoke. She has never used smokeless tobacco. She reports current alcohol use. She reports that she does not use drugs. family history includes Alcohol abuse in an other family member; Allergies in her sister; Angioedema in her sister; Cancer in her father, paternal aunt, and another family member; Deep vein thrombosis in her sister; Diabetes in her mother and another family member; Eczema in her  sister; Heart disease in an other family member; Hypertension in her father and mother. Allergies  Allergen Reactions   Avocado Nausea And Vomiting    Projectile N/V   Fish Allergy Anaphylaxis and Swelling    Pollock.Swelling of lips, throat and face.   Oxaprozin     angioedema   Sulfonamide Derivatives     REACTION: hives   Latex Rash   Orange Juice [Orange Oil] Hives    Orange juice   Tomato Hives    tomatoes   Zyrtec [Cetirizine] Hives and Rash   Current Outpatient Medications on File Prior to Visit  Medication Sig Dispense Refill   cromolyn (OPTICROM) 4 % ophthalmic solution Place 1 drop into both eyes 4 (four) times daily as needed (itchy/watery eyes). 10 mL 1   dabigatran (PRADAXA) 150 MG CAPS capsule TAKE 1 CAPSULE BY MOUTH EVERY 12 HOURS 60 capsule 0   EPINEPHrine 0.3 mg/0.3 mL IJ SOAJ injection Inject 0.3 mg into the muscle as needed for anaphylaxis. 1 each 2   fluticasone (FLONASE) 50 MCG/ACT nasal spray Place 1 spray into both nostrils daily. 16 g 5   levocetirizine (XYZAL) 5 MG tablet Take 1 tablet (5 mg total) by mouth 2 (two) times daily as needed for allergies (Can take an extra dose during flare ups.). 60 tablet 5   montelukast (SINGULAIR) 10 MG  tablet Take 1 tablet (10 mg total) by mouth at bedtime as needed (allergies). 30 tablet 5   triamcinolone ointment (KENALOG) 0.1 % Apply 1 application topically 2 (two) times daily as needed (rash flare). Do not use on the face, neck, armpits or groin area. Do not use more than 3 weeks in a row. 30 g 1   No current facility-administered medications on file prior to visit.        ROS:  All others reviewed and negative.  Objective        PE:  BP 126/78 (BP Location: Right Arm, Patient Position: Sitting, Cuff Size: Large)   Pulse 85   Temp 99.1 F (37.3 C) (Oral)   Ht 5\' 7"  (1.702 m)   Wt 255 lb (115.7 kg)   LMP  (LMP Unknown)   SpO2 99%   BMI 39.94 kg/m                 Constitutional: Pt appears in NAD, non toxic but  can't lie on her back               HENT: Head: NCAT.                Right Ear: External ear normal.                 Left Ear: External ear normal.                Eyes: . Pupils are equal, round, and reactive to light. Conjunctivae and EOM are normal               Nose: without d/c or deformity               Neck: Neck supple. Gross normal ROM               Cardiovascular: Normal rate and regular rhythm.                 Pulmonary/Chest: Effort normal and breath sounds without rales or wheezing.                Abd:  Soft, NT, ND, + BS, no organomegaly               Neurological: Pt is alert. At baseline orientation, motor grossly intact               Skin:  LE edema - none, left upper buttoc area wit 3 cm area induration with marked tenderness, non fluctuance and no drainage               Psychiatric: Pt behavior is normal without agitation   Micro: none  Cardiac tracings I have personally interpreted today:  none  Pertinent Radiological findings (summarize): none   Lab Results  Component Value Date   WBC 4.3 06/02/2021   HGB 11.7 (L) 06/02/2021   HCT 36.0 06/02/2021   PLT 163.0 06/02/2021   GLUCOSE 95 06/02/2021   CHOL 186 06/02/2021   TRIG 104.0 06/02/2021   HDL 47.30 06/02/2021   LDLCALC 118 (H) 06/02/2021   ALT 27 06/02/2021   AST 24 06/02/2021   NA 140 06/02/2021   K 4.2 06/02/2021   CL 104 06/02/2021   CREATININE 0.63 06/02/2021   BUN 10 06/02/2021   CO2 29 06/02/2021   TSH 2.14 06/02/2021   INR 1.01 12/09/2017   HGBA1C 6.6 (H) 06/02/2021   MICROALBUR <0.7 06/02/2021   Assessment/Plan:  08/02/2021  Hannah Thompson is a 51 y.o. Black or African American [2] female with  has a past medical history of ALLERGIC RHINITIS (03/17/2009), Anemia, ASTHMA (03/17/2009), DVT (deep venous thrombosis) (HCC), ECZEMA (03/17/2009), Headache(784.0) (06/21/2009), HYPERLIPIDEMIA (03/17/2009), KELOID (06/21/2009), Pulmonary emboli (HCC), and VITAMIN B12 DEFICIENCY (03/18/2009).  Abscess, gluteal,  left Mild to mod, for antibx course doxycycline 100 bid,  toradol 30 mg Im, and hydrocodone 5 325 mg qid prn, to f/u any worsening symptoms or concerns  Vitamin D deficiency Last vitamin D Lab Results  Component Value Date   VD25OH 8.37 (L) 06/02/2021   Low, reminded to start oral replacement   Hypertension BP Readings from Last 3 Encounters:  11/30/21 117/80  11/30/21 126/78  08/25/21 112/70   Stable, pt to continue medical treatment   - diet, low salt, excercise   Hyperglycemia Lab Results  Component Value Date   HGBA1C 6.6 (H) 06/02/2021   Stable, pt to continue current medical treatment  - diet, wt control, excercie  Followup: Return in about 6 months (around 06/08/2022).  Oliver Barre, MD 11/30/2021 9:08 PM [facility]

## 2021-11-30 NOTE — Assessment & Plan Note (Signed)
Mild to mod, for antibx course doxycycline 100 bid,  toradol 30 mg Im, and hydrocodone 5 325 mg qid prn, to f/u any worsening symptoms or concerns

## 2021-11-30 NOTE — Assessment & Plan Note (Signed)
Lab Results  Component Value Date   HGBA1C 6.6 (H) 06/02/2021   Stable, pt to continue current medical treatment  - diet, wt control, excercie

## 2021-12-01 ENCOUNTER — Telehealth: Payer: Self-pay | Admitting: Internal Medicine

## 2021-12-01 ENCOUNTER — Encounter (HOSPITAL_COMMUNITY): Payer: Self-pay | Admitting: *Deleted

## 2021-12-01 ENCOUNTER — Ambulatory Visit: Payer: Medicaid Other

## 2021-12-01 ENCOUNTER — Ambulatory Visit (HOSPITAL_COMMUNITY)
Admission: EM | Admit: 2021-12-01 | Discharge: 2021-12-01 | Disposition: A | Discharge status: Home / Self Care | Payer: Commercial Managed Care - HMO

## 2021-12-01 DIAGNOSIS — L0231 Cutaneous abscess of buttock: Secondary | ICD-10-CM | POA: Diagnosis not present

## 2021-12-01 DIAGNOSIS — Z5189 Encounter for other specified aftercare: Secondary | ICD-10-CM

## 2021-12-01 NOTE — Discharge Instructions (Signed)
Take the doxycycline as prescribed.  Take the pain medication as prescribed as well.  The abscess may continue to drain. You may shower, let water run over the abscess then pat the area dry.  Come back to the clinic in 24 to 48 hours to have the packing removed.  You may wear adult briefs/depends to help with the drainage and dressing changes since it is in a difficult place for you to do them yourself.  Return sooner if experiencing worsening symptoms for re-check. I hope you feel better!

## 2021-12-01 NOTE — ED Triage Notes (Signed)
Pt states she is here for follow up from her abscess on her buttocks. She is here to have her abscess repacked, she states she doesn't have help at home to do it. She was given a pain med rx but can't get it filled due to PA, I gave her a goodrx for her to get it filled at [facility]. She will call to have rx moved.

## 2021-12-01 NOTE — Telephone Encounter (Signed)
Patient called and said that [facility] on [address] said that they need a PA done on her pain medication that was sent in.  Patient did not know the name of the medication.

## 2021-12-01 NOTE — ED Provider Notes (Incomplete)
[facility]    CSN: [id number] Arrival date & time: 12/01/21  1601      History   Chief Complaint Chief Complaint  Patient presents with  . Abscess    HPI Hannah Thompson is a 51 y.o. female.   Patient presents urgent care for wound check after abscess incision and drainage with packing last night.  Abscess was drained at approximately 7:45 PM last night, patient went home with doxycycline antibiotic and prescription for Norco Vicodin pain pills but has not taken either of those medications yet.  She presents today to have the wound further evaluated and have the packing removed.  She states the wound has drained significantly overnight and soaked through the nonstick gauze that was placed on the wound last night at urgent care.  Drainage has seeped through the dressing, onto her underwear, and onto her outer layers of her clothing.  Drainage is foul smelling and yellow/white.  Patient was planning on taking her doxycycline antibiotic this morning but has not eaten anything today and wanted to wait until she had food in her body to take the antibiotic.  She was unable to pick up her strong pain medication due to need for prior authorization for insurance.  Nursing staff provided patient with good Rx coupon and she plans to [facility] the [facility] of the [facility] so that she is able to get this medication filled.  She believes that the packing of the abscess has remained intact despite the drainage but she is unsure as it is difficult for her to look in this area.  She does not have any help at home other than her 24 year old son who she states does not want to look at the area.   Abscess   Past Medical History:  Diagnosis Date  . ALLERGIC RHINITIS 03/17/2009  . Anemia   . ASTHMA 03/17/2009  . DVT (deep venous thrombosis) (HCC)   . ECZEMA 03/17/2009  . Headache(784.0) 06/21/2009   recurrent  . HYPERLIPIDEMIA 03/17/2009  . KELOID 06/21/2009   . Pulmonary emboli (HCC)   . VITAMIN B12 DEFICIENCY 03/18/2009    Patient Active Problem List   Diagnosis Date Noted  . Abscess, gluteal, left 11/30/2021  . Seasonal and perennial allergic rhinoconjunctivitis 02/16/2021  . Urinary frequency 12/05/2020  . Left knee pain 10/01/2020  . Food allergy 08/10/2020  . Multiple drug allergies 08/10/2020  . Oral allergy syndrome, subsequent encounter 08/10/2020  . Other atopic dermatitis 08/10/2020  . Toe pain, right 05/31/2020  . Vitamin D deficiency 05/25/2020  . Costochondritis 03/01/2020  . Left hand pain 03/01/2020  . Abnormal cervical Papanicolaou smear 02/25/2020  . Hyperglycemia 06/20/2019  . Pulmonary embolism (HCC) 12/10/2017  . Recurrent pulmonary embolism (HCC) 02/26/2017  . Hypertension 10/17/2016  . Hypokalemia 10/17/2016  . Toxic conjunctivitis, left 08/12/2016  . Encounter for well adult exam with abnormal findings 06/27/2016  . Acute pain of right shoulder 06/27/2016  . Foreign body in right foot 03/27/2016  . Grade 2 ankle sprain 07/27/2015  . Headache 04/27/2015  . Hyperpigmentation 03/01/2015  . Skin trauma 03/01/2015  . Medial meniscus tear 01/13/2015  . Effusion of left knee 01/13/2015  . Encounter for therapeutic drug monitoring 07/07/2014  . Pedal edema 04/28/2014  . Chronic low back pain 02/03/2014  . Burn of right foot 12/19/2013  . Cellulitis 11/21/2013  . Therapeutic drug monitoring 11/21/2013  . Chronic anticoagulation 10/21/2013  . Pulmonary embolus (HCC) 10/15/2013  . Late  effect of superficial injury 09/11/2013  . Pruritus 09/11/2013  . Acute pulmonary embolism (HCC) 09/04/2013  . Deep venous thrombosis (HCC) 09/04/2013  . Thrombocytopenic disorder (HCC) 09/04/2013  . Syncope and collapse 09/04/2013  . Grief 08/18/2013  . Right arm pain 05/30/2013  . Angioedema 03/14/2013  . Obesity 05/13/2011  . KELOID 06/21/2009  . SKIN LESION 06/21/2009  . Headache(784.0) 06/21/2009  . B12 deficiency  03/18/2009  . Hyperlipidemia 03/17/2009  . Anemia 03/17/2009  . Asthma 03/17/2009  . ECZEMA 03/17/2009  . DISC DISEASE, LUMBAR 03/17/2009  . FATIGUE 03/17/2009  . Cervical high risk HPV (human papillomavirus) test positive 01/03/1999  . Chlamydial infection 01/02/1989    Past Surgical History:  Procedure Laterality Date  . KELOID EXCISION  mid 1990's   s/p keloid removal-laser     OB History     Gravida  5   Para  3   Term  3   Preterm      AB  2   Living  3      SAB  1   IAB  1   Ectopic      Multiple      Live Births               Home Medications    Prior to Admission medications   Medication Sig Start Date End Date Taking? Authorizing Provider  cromolyn (OPTICROM) 4 % ophthalmic solution Place 1 drop into both eyes 4 (four) times daily as needed (itchy/watery eyes). 08/15/21  Yes Ellamae SiaKim, Yoon M, DO  dabigatran (PRADAXA) 150 MG CAPS capsule TAKE 1 CAPSULE BY MOUTH EVERY 12 HOURS 02/10/21  Yes Ennever, Rose PhiPeter R, MD  doxycycline (VIBRA-TABS) 100 MG tablet Take 1 tablet (100 mg total) by mouth 2 (two) times daily. 11/30/21  Yes Corwin LevinsJohn, James W, MD  EPINEPHrine 0.3 mg/0.3 mL IJ SOAJ injection Inject 0.3 mg into the muscle as needed for anaphylaxis. 08/09/20  Yes Ellamae SiaKim, Yoon M, DO  fluticasone (FLONASE) 50 MCG/ACT nasal spray Place 1 spray into both nostrils daily. 08/15/21  Yes Ellamae SiaKim, Yoon M, DO  HYDROcodone-acetaminophen (NORCO/VICODIN) 5-325 MG tablet Take 1 tablet by mouth every 6 (six) hours as needed. 11/30/21  Yes Corwin LevinsJohn, James W, MD  levocetirizine (XYZAL) 5 MG tablet Take 1 tablet (5 mg total) by mouth 2 (two) times daily as needed for allergies (Can take an extra dose during flare ups.). 08/15/21  Yes Ellamae SiaKim, Yoon M, DO  montelukast (SINGULAIR) 10 MG tablet Take 1 tablet (10 mg total) by mouth at bedtime as needed (allergies). 08/25/21  Yes Ellamae SiaKim, Yoon M, DO  triamcinolone ointment (KENALOG) 0.1 % Apply 1 application topically 2 (two) times daily as needed (rash flare). Do  not use on the face, neck, armpits or groin area. Do not use more than 3 weeks in a row. 02/16/21  Yes Ellamae SiaKim, Yoon M, DO    Family History Family History  Problem Relation Age of Onset  . Hypertension Mother   . Diabetes Mother   . Cancer Father        Prostate Cancer  . Hypertension Father   . Eczema Sister   . Allergies Sister   . Angioedema Sister   . Deep vein thrombosis Sister   . Cancer Paternal Aunt        Ovarian Cancer  . Cancer Other        Grandparent-Lung Cancer  . Diabetes Other        Grandparent  . Heart disease Other  Grandparent  . Alcohol abuse Other        Several on both sides of family-3 uncles    Social History Social History   Tobacco Use  . Smoking status: Former    Packs/day: 0.25    Years: 4.00    Total pack years: 1.00    Types: Cigarettes    Start date: 03/17/2004    Quit date: 04/17/2008    Years since quitting: 13.6    Passive exposure: Never  . Smokeless tobacco: Never  . Tobacco comments:    quit 5 years ago  Vaping Use  . Vaping Use: Never used  Substance Use Topics  . Alcohol use: Yes    Alcohol/week: 0.0 standard drinks of alcohol    Comment: socially  . Drug use: No     Allergies   Avocado, Fish allergy, Oxaprozin, Sulfonamide derivatives, Latex, Orange juice [orange oil], Tomato, and Zyrtec [cetirizine]   Review of Systems Review of Systems Per HPI  Physical Exam Triage Vital Signs ED Triage Vitals  Enc Vitals Group     BP 12/01/21 1105 130/86     Pulse Rate 12/01/21 1105 60     Resp 12/01/21 1105 18     Temp 12/01/21 1105 98 F (36.7 C)     Temp Source 12/01/21 1105 Oral     SpO2 12/01/21 1105 99 %     Weight --      Height --      Head Circumference --      Peak Flow --      Pain Score 12/01/21 1104 7     Pain Loc --      Pain Edu? --      Excl. in GC? --    No data found.  Updated Vital Signs BP 130/86 (BP Location: Right Arm)   Pulse 60   Temp 98 F (36.7 C) (Oral)   Resp 18   LMP  (LMP  Unknown)   SpO2 99%   Visual Acuity Right Eye Distance:   Left Eye Distance:   Bilateral Distance:    Right Eye Near:   Left Eye Near:    Bilateral Near:     Physical Exam Vitals and nursing note reviewed.  Constitutional:      Appearance: She is not ill-appearing or toxic-appearing.  HENT:     Head: Normocephalic and atraumatic.     Right Ear: Hearing and external ear normal.     Left Ear: Hearing and external ear normal.     Nose: Nose normal.     Mouth/Throat:     Lips: Pink.  Eyes:     General: Lids are normal. Vision grossly intact. Gaze aligned appropriately.     Extraocular Movements: Extraocular movements intact.     Conjunctiva/sclera: Conjunctivae normal.  Pulmonary:     Effort: Pulmonary effort is normal.  Genitourinary:    Comments: Left gluteal abscess to the left of the gluteal cleft appears to be draining with packing wick intact.  Significant amount of foul-smelling yellow and purulent drainage to the nonstick pad on assessment.  Abscess is nonfluctuant with surrounding area of soft tissue swelling and erythema. Musculoskeletal:     Cervical back: Neck supple.  Skin:    General: Skin is warm and dry.     Capillary Refill: Capillary refill takes less than 2 seconds.     Findings: No rash.  Neurological:     General: No focal deficit present.     Mental Status:  She is alert and oriented to person, place, and time. Mental status is at baseline.     Cranial Nerves: No dysarthria or facial asymmetry.  Psychiatric:        Mood and Affect: Mood normal.        Speech: Speech normal.        Behavior: Behavior normal.        Thought Content: Thought content normal.        Judgment: Judgment normal.     UC Treatments / Results  Labs (all labs ordered are listed, but only abnormal results are displayed) Labs Reviewed - No data to display  EKG   Radiology No results found.  Procedures Procedures (including critical care time)  Medications Ordered in  UC Medications - No data to display  Initial Impression / Assessment and Plan / UC Course  I have reviewed the triage vital signs and the nursing notes.  Pertinent labs & imaging results that were available during my care of the patient were reviewed by me and considered in my medical decision making (see chart for details).   1.  Abscess of buttock, left Patient to return to the clinic in 24 to 48 hours to have the packing removed as it has only been in place for 12 hours.  Packing appears to be in place and is not  *** Final Clinical Impressions(s) / UC Diagnoses   Final diagnoses:  Wound check, abscess  Abscess of buttock, left     Discharge Instructions      Take the doxycycline as prescribed.  Take the pain medication as prescribed as well.  The abscess may continue to drain. You may shower, let water run over the abscess then pat the area dry.  Come back to the clinic in 24 to 48 hours to have the packing removed.  You may wear adult briefs/depends to help with the drainage and dressing changes since it is in a difficult place for you to do them yourself.  Return sooner if experiencing worsening symptoms for re-check. I hope you feel better!    ED Prescriptions   None    PDMP not reviewed this encounter.

## 2021-12-02 DIAGNOSIS — Z419 Encounter for procedure for purposes other than remedying health state, unspecified: Secondary | ICD-10-CM | POA: Diagnosis not present

## 2021-12-02 NOTE — Telephone Encounter (Signed)
Tried to do PA WOULD NOT GO THROUGH. Need to verify insurance. Called pt no answer LMOM RTC need to verify insurance.Marland KitchenShearon Stalls

## 2021-12-03 NOTE — ED Provider Notes (Signed)
[facility]   [id number] 11/30/21 Arrival Time: 1916  ASSESSMENT & PLAN:  1. Abscess of buttock, left     Incision and Drainage Procedure Note  Anesthesia: 1% plain lidocaine  Procedure Details  The procedure, risks and complications have been discussed in detail (including, but not limited to pain and bleeding) with the patient.  The skin induration was prepped and draped in the usual fashion. After adequate local anesthesia, a fairly deep I&D with a #11 blade was performed on the left upper buttock lateral to gluteal cleft with copious drainage.  EBL: minimal Drains: none Packing: 1/2" Condition: Tolerated procedure well Complications: none.  Has Rx for hydrocodone and doxycycline given by PCP.  Meds ordered this encounter  Medications   DISCONTD: bupivacaine(PF) (MARCAINE) 0.5 % injection 10 mL   Wound care instructions discussed and given in written format. To return in 48 hours for wound check, sooner if needed.  Finish all antibiotics. OTC analgesics as needed.  Reviewed expectations re: course of current medical issues. Questions answered. Outlined signs and symptoms indicating need for more acute intervention. Patient verbalized understanding. After Visit Summary given.   SUBJECTIVE:  Hannah Thompson is a 51 y.o. female who presents with a possible infection of her L buttock. Onset gradual, few days; without active drainage and without active bleeding. Symptoms have gradually worsened since beginning. Fever: absent. OTC/home treatment: none. Saw PCP today; given hydrocodone and doxy.  OBJECTIVE:  Vitals:   11/30/21 1928 11/30/21 1930  BP: 117/80   Pulse: 82   Resp: 16   Temp: 98 F (36.7 C)   TempSrc: Oral   SpO2: 96%   Weight:  115.7 kg  Height:  5\' 7"  (1.702 m)    General appearance: alert; no distress Buttock: approx 3x4 cm area of thickened skin with central induration of her LEFT upper buttock; tender to touch; no active drainage  or bleeding Psychological: alert and cooperative; normal mood and affect  Allergies  Allergen Reactions   Avocado Nausea And Vomiting    Projectile N/V   Fish Allergy Anaphylaxis and Swelling    Pollock.Swelling of lips, throat and face.   Oxaprozin     angioedema   Sulfonamide Derivatives     REACTION: hives   Latex Rash   Orange Juice [Orange Oil] Hives    Orange juice   Tomato Hives    tomatoes   Zyrtec [Cetirizine] Hives and Rash    Past Medical History:  Diagnosis Date   ALLERGIC RHINITIS 03/17/2009   Anemia    ASTHMA 03/17/2009   DVT (deep venous thrombosis) (HCC)    ECZEMA 03/17/2009   Headache(784.0) 06/21/2009   recurrent   HYPERLIPIDEMIA 03/17/2009   KELOID 06/21/2009   Pulmonary emboli (HCC)    VITAMIN B12 DEFICIENCY 03/18/2009   Social History   Socioeconomic History   Marital status: Single    Spouse name: Not on file   Number of children: 3   Years of education: Not on file   Highest education level: Not on file  Occupational History   Occupation: Hair stylist  Tobacco Use   Smoking status: Former    Packs/day: 0.25    Years: 4.00    Total pack years: 1.00    Types: Cigarettes    Start date: 03/17/2004    Quit date: 04/17/2008    Years since quitting: 13.6    Passive exposure: Never   Smokeless tobacco: Never   Tobacco comments:    quit 5 years ago  Vaping Use   Vaping Use: Never used  Substance and Sexual Activity   Alcohol use: Yes    Alcohol/week: 0.0 standard drinks of alcohol    Comment: socially   Drug use: No   Sexual activity: Not on file  Other Topics Concern   Not on file  Social History Narrative   Not on file   Social Determinants of Health   Financial Resource Strain: Not on file  Food Insecurity: Not on file  Transportation Needs: Not on file  Physical Activity: Not on file  Stress: Not on file  Social Connections: Not on file   Family History  Problem Relation Age of Onset   Hypertension Mother    Diabetes Mother     Cancer Father        Prostate Cancer   Hypertension Father    Eczema Sister    Allergies Sister    Angioedema Sister    Deep vein thrombosis Sister    Cancer Paternal Aunt        Ovarian Cancer   Cancer Other        Grandparent-Lung Cancer   Diabetes Other        Grandparent   Heart disease Other        Grandparent   Alcohol abuse Other        Several on both sides of family-3 uncles   Past Surgical History:  Procedure Laterality Date   KELOID EXCISION  mid 1990's   s/p keloid removal-laser             Vanessa Kick, MD 12/03/21 1229

## 2021-12-04 ENCOUNTER — Ambulatory Visit
Admission: EM | Admit: 2021-12-04 | Discharge: 2021-12-04 | Disposition: A | Discharge status: Home / Self Care | Payer: Commercial Managed Care - HMO

## 2021-12-04 DIAGNOSIS — L0501 Pilonidal cyst with abscess: Secondary | ICD-10-CM

## 2021-12-04 NOTE — Discharge Instructions (Addendum)
During your visit today, we removed all the packing that was placed into your wound a few days ago.  It is my personal opinion that this wound is very deep and it continues to be infected despite you are taking antibiotics.  I do recommend that you complete the full course of doxycycline exactly as prescribed.  I recommend that you change the dressing on your wound twice daily and inspect the dressing for signs of purulent drainage, blood and foul odor.  If you do not see meaningful improvement of tenderness, purulent drainage, redness or you begin to develop worsening symptoms, especially if you develop a fever, please go to the emergency room for further evaluation.  Please make an appointment with your primary care provider soon as possible for follow-up of this wound, is important that it is monitored regularly to ensure proper healing.  Thank you for visiting urgent care today.

## 2021-12-04 NOTE — ED Triage Notes (Signed)
Pt reports abscess in left buttock x 4 days. Reports pain, bloody drainage. Pt is taking doxycyline for the abscess (started on 12/01/21).
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
## 2021-12-05 NOTE — Telephone Encounter (Signed)
PA went through received determination  med was APPROVED. Effective 12/05/2021 through 12/05/2022. Faxed approval to [facility]...lmb

## 2022-01-02 DIAGNOSIS — Z419 Encounter for procedure for purposes other than remedying health state, unspecified: Secondary | ICD-10-CM | POA: Diagnosis not present

## 2022-02-02 DIAGNOSIS — Z419 Encounter for procedure for purposes other than remedying health state, unspecified: Secondary | ICD-10-CM | POA: Diagnosis not present

## 2022-02-15 ENCOUNTER — Encounter: Payer: Self-pay | Admitting: Internal Medicine

## 2022-02-26 NOTE — Progress Notes (Unsigned)
Follow Up Note  RE: Hannah Thompson MRN: [id number] DOB: [birth year] Date of Office Visit: 02/27/2022  Referring provider: Biagio Borg, MD Primary care provider: Biagio Borg, MD  Chief Complaint: No chief complaint on file.  History of Present Illness: I had the pleasure of seeing Hannah Thompson for a follow up visit at the [facility] on 02/26/2022. She is a 52 y.o. female, who is being followed for allergic rhinoconjunctivitis, food allergy, atopic dermatitis and multiple drug allergies. Her previous allergy office visit was on 08/25/2021 with Dr. Maudie Mercury. Today is a regular follow up visit.  Seasonal and perennial allergic rhinoconjunctivitis Past history - Perennial rhinoconjunctivitis symptoms for 40+ years with worsening from spring through fall.  Zyrtec caused a rash and swelling.  Skin testing over 20 years ago showed multiple positives per patient report and was on allergy immunotherapy for a few years with good benefit. 2022 skin testing showed: Positive to grass, weed pollen, ragweed, trees, mold, dust mites, cat, feathers, cockroach. Interim history - flares when cleaning. Can't commit to AIT.   Continue environmental control measures. Wear mask when cleaning.  Use over the counter antihistamines such as Claritin (loratadine), Allegra (fexofenadine), or Xyzal (levocetirizine) daily as needed. May take twice a day during allergy flares. May switch antihistamines every few months. Use Flonase (fluticasone) OR Nasonex nasal spray 1 spray per nostril twice a day as needed for nasal congestion.  Nasal saline spray (i.e., Simply Saline) or nasal saline lavage (i.e., NeilMed) is recommended as needed and prior to medicated nasal sprays. Use cromolyn 4% 1 drop in each eye up to four times a day as needed for itchy/watery eyes.  During an allergy flare you may: Start Singulair (montelukast) '10mg'$  daily at night. Cautioned that in some children/adults can experience  behavioral changes including hyperactivity, agitation, depression, sleep disturbances and suicidal ideations. These side effects are rare, but if you notice them you should notify me and discontinue Singulair (montelukast).    Food allergy Past history - Fresh tomatoes cause facial pruritus/? Tongue swelling. Tolerates processed tomatoes. Coconut causes contact rash and perioral pruritus. Melons/oranges cause perioral pruritus. Avocados caused vomiting/pruritus. Pollock caused nausea, pruritus and eye swelling. Eats other seafood with no issues. 2022 skin testing showed: Borderline positive to soy, hazelnut, pistachio, orange, cantaloupe and watermelon. Interim history - no reactions.  Continue to avoid foods that are bothersome - melons, oranges, coconut, fresh tomatoes, avocado, pollock. For mild symptoms you can take over the counter antihistamines such as Benadryl and monitor symptoms closely. If symptoms worsen or if you have severe symptoms including breathing issues, throat closure, significant swelling, whole body hives, severe diarrhea and vomiting, lightheadedness then inject epinephrine and seek immediate medical care afterwards. Action plan in place.    Other atopic dermatitis Sometimes breaking out on her arms and chest with no triggers. Triamcinolone helps.  Continue proper skin care. Use triamcinolone 0.1% ointment twice a day as needed for rash flares. Do not use on the face, neck, armpits or groin area. Do not use more than 3 weeks in a row   Multiple drug allergies Continue to avoid drugs on allergy list.   Assessment and Plan: Hannah Thompson is a 52 y.o. female with: No problem-specific Assessment & Plan notes found for this encounter.  No follow-ups on file.  No orders of the defined types were placed in this encounter.  Lab Orders  No laboratory test(s) ordered today    Diagnostics: Spirometry:  Tracings reviewed.  Her effort: {Blank single:19197::"Good reproducible  efforts.","It was hard to get consistent efforts and there is a question as to whether this reflects a maximal maneuver.","Poor effort, data can not be interpreted."} FVC: ***L FEV1: ***L, ***% predicted FEV1/FVC ratio: ***% Interpretation: {Blank single:19197::"Spirometry consistent with mild obstructive disease","Spirometry consistent with moderate obstructive disease","Spirometry consistent with severe obstructive disease","Spirometry consistent with possible restrictive disease","Spirometry consistent with mixed obstructive and restrictive disease","Spirometry uninterpretable due to technique","Spirometry consistent with normal pattern","No overt abnormalities noted given today's efforts"}.  Please see scanned spirometry results for details.  Skin Testing: {Blank single:19197::"Select foods","Environmental allergy panel","Environmental allergy panel and select foods","Food allergy panel","None","Deferred due to recent antihistamines use"}. *** Results discussed with patient/family.   Medication List:  Current Outpatient Medications  Medication Sig Dispense Refill   cromolyn (OPTICROM) 4 % ophthalmic solution Place 1 drop into both eyes 4 (four) times daily as needed (itchy/watery eyes). 10 mL 1   dabigatran (PRADAXA) 150 MG CAPS capsule TAKE 1 CAPSULE BY MOUTH EVERY 12 HOURS 60 capsule 0   doxycycline (VIBRA-TABS) 100 MG tablet Take 1 tablet (100 mg total) by mouth 2 (two) times daily. 20 tablet 0   EPINEPHrine 0.3 mg/0.3 mL IJ SOAJ injection Inject 0.3 mg into the muscle as needed for anaphylaxis. 1 each 2   fluticasone (FLONASE) 50 MCG/ACT nasal spray Place 1 spray into both nostrils daily. 16 g 5   levocetirizine (XYZAL) 5 MG tablet Take 1 tablet (5 mg total) by mouth 2 (two) times daily as needed for allergies (Can take an extra dose during flare ups.). 60 tablet 5   montelukast (SINGULAIR) 10 MG tablet Take 1 tablet (10 mg total) by mouth at bedtime as needed (allergies). 30 tablet 5    triamcinolone ointment (KENALOG) 0.1 % Apply 1 application topically 2 (two) times daily as needed (rash flare). Do not use on the face, neck, armpits or groin area. Do not use more than 3 weeks in a row. 30 g 1   No current facility-administered medications for this visit.   Allergies: Allergies  Allergen Reactions   Avocado Nausea And Vomiting    Projectile N/V   Fish Allergy Anaphylaxis and Swelling    Pollock.Swelling of lips, throat and face.   Oxaprozin     angioedema   Sulfonamide Derivatives     REACTION: hives   Latex Rash   Orange Juice [Orange Oil] Hives    Orange juice   Tomato Hives    tomatoes   Zyrtec [Cetirizine] Hives and Rash   I reviewed her past medical history, social history, family history, and environmental history and no significant changes have been reported from her previous visit.  Review of Systems  Constitutional:  Negative for appetite change, chills, fever and unexpected weight change.  HENT:  Negative for congestion and rhinorrhea.   Eyes:  Negative for itching.  Respiratory:  Negative for cough, chest tightness, shortness of breath and wheezing.   Cardiovascular:  Negative for chest pain.  Gastrointestinal:  Negative for abdominal pain.  Genitourinary:  Negative for difficulty urinating.  Skin:  Positive for rash.  Allergic/Immunologic: Positive for environmental allergies and food allergies.  Neurological:  Negative for headaches.    Objective: LMP  (LMP Unknown)  There is no height or weight on file to calculate BMI. Physical Exam Vitals and nursing note reviewed.  Constitutional:      Appearance: Normal appearance. She is well-developed.  HENT:     Head: Normocephalic and atraumatic.     Right Ear:  Tympanic membrane and external ear normal.     Left Ear: Tympanic membrane and external ear normal.     Nose: Nose normal.     Mouth/Throat:     Mouth: Mucous membranes are moist.     Pharynx: Oropharynx is clear.  Eyes:      Conjunctiva/sclera: Conjunctivae normal.  Cardiovascular:     Rate and Rhythm: Normal rate and regular rhythm.     Heart sounds: Normal heart sounds. No murmur heard.    No friction rub. No gallop.  Pulmonary:     Effort: Pulmonary effort is normal.     Breath sounds: Normal breath sounds. No wheezing, rhonchi or rales.  Abdominal:     Palpations: Abdomen is soft.  Musculoskeletal:     Cervical back: Neck supple.  Skin:    General: Skin is warm.     Findings: No rash.  Neurological:     Mental Status: She is alert and oriented to person, place, and time.    Previous notes and tests were reviewed. The plan was reviewed with the patient/family, and all questions/concerned were addressed.  It was my pleasure to see Hannah Thompson today and participate in her care. Please feel free to contact me with any questions or concerns.  Sincerely,  Rexene Alberts, DO Allergy & Immunology  [facility] office: [place] office: [phone]

## 2022-02-27 ENCOUNTER — Encounter: Payer: Self-pay | Admitting: Allergy

## 2022-02-27 ENCOUNTER — Ambulatory Visit (INDEPENDENT_AMBULATORY_CARE_PROVIDER_SITE_OTHER): Payer: Medicaid Other | Admitting: Allergy

## 2022-02-27 ENCOUNTER — Other Ambulatory Visit: Payer: Self-pay

## 2022-02-27 VITALS — BP 132/82 | HR 55 | Temp 98.2°F | Resp 18 | Ht 67.0 in | Wt 259.2 lb

## 2022-02-27 DIAGNOSIS — H1010 Acute atopic conjunctivitis, unspecified eye: Secondary | ICD-10-CM

## 2022-02-27 DIAGNOSIS — J302 Other seasonal allergic rhinitis: Secondary | ICD-10-CM

## 2022-02-27 DIAGNOSIS — L2089 Other atopic dermatitis: Secondary | ICD-10-CM | POA: Diagnosis not present

## 2022-02-27 DIAGNOSIS — Z889 Allergy status to unspecified drugs, medicaments and biological substances status: Secondary | ICD-10-CM

## 2022-02-27 DIAGNOSIS — H1013 Acute atopic conjunctivitis, bilateral: Secondary | ICD-10-CM

## 2022-02-27 DIAGNOSIS — T781XXD Other adverse food reactions, not elsewhere classified, subsequent encounter: Secondary | ICD-10-CM

## 2022-02-27 MED ORDER — DESONIDE 0.05 % EX OINT: 1.0000 | TOPICAL_OINTMENT | Freq: Two times a day (BID) | CUTANEOUS | 2 refills | Status: DC | PRN

## 2022-02-27 MED ORDER — EPINEPHRINE 0.3 MG/0.3ML IJ SOAJ: 0.3000 mg | INTRAMUSCULAR | 1 refills | Status: DC | PRN

## 2022-02-27 NOTE — Assessment & Plan Note (Signed)
Past history - Perennial rhinoconjunctivitis symptoms for 40+ years with worsening from spring through fall.  Zyrtec caused a rash and swelling.  Skin testing over 20 years ago showed multiple positives per patient report and was on allergy immunotherapy for a few years with good benefit. 2022 skin testing showed: Positive to grass, weed pollen, ragweed, trees, mold, dust mites, cat, feathers, cockroach. Interim history - not interested in AIT. Stable with Singulair and Xyzal. Continue environmental control measures. Continue Xyzal (levocetirizine) '5mg'$  daily. May take twice a day during allergy flares.  Continue Singulair (montelukast) '10mg'$  daily at night. Use Flonase (fluticasone) OR Nasonex nasal spray 1 spray per nostril twice a day as needed for nasal congestion.  Nasal saline spray (i.e., Simply Saline) or nasal saline lavage (i.e., NeilMed) is recommended as needed and prior to medicated nasal sprays. Use cromolyn 4% 1 drop in each eye up to four times a day as needed for itchy/watery eyes.  Consider allergy injections for long term control if above medications do not help the symptoms.

## 2022-02-27 NOTE — Assessment & Plan Note (Signed)
Past history - Fresh tomatoes cause facial pruritus/? Tongue swelling. Tolerates processed tomatoes. Coconut causes contact rash and perioral pruritus. Melons/oranges cause perioral pruritus. Avocados caused vomiting/pruritus. Pollock caused nausea, pruritus and eye swelling. Eats other seafood with no issues. 2022 skin testing showed: Borderline positive to soy, hazelnut, pistachio, orange, cantaloupe and watermelon. Interim history - no reactions.  Continue to avoid foods that are bothersome - melons, oranges, coconut, fresh tomatoes, avocado, pollock. For mild symptoms you can take over the counter antihistamines such as Benadryl and monitor symptoms closely. If symptoms worsen or if you have severe symptoms including breathing issues, throat closure, significant swelling, whole body hives, severe diarrhea and vomiting, lightheadedness then inject epinephrine and seek immediate medical care afterwards. Action plan in place.

## 2022-02-27 NOTE — Patient Instructions (Addendum)
Environmental allergies: 2022 skin testing showed: Positive to grass, weed pollen, ragweed, trees, mold, dust mites, cat, feathers, cockroach. Continue environmental control measures. Continue Xyzal (levocetirizine) '5mg'$  daily. May take twice a day during allergy flares.  Continue Singulair (montelukast) '10mg'$  daily at night. Use Flonase (fluticasone) OR Nasonex nasal spray 1 spray per nostril twice a day as needed for nasal congestion.  Nasal saline spray (i.e., Simply Saline) or nasal saline lavage (i.e., NeilMed) is recommended as needed and prior to medicated nasal sprays. Use cromolyn 4% 1 drop in each eye up to four times a day as needed for itchy/watery eyes.  Consider allergy injections for long term control if above medications do not help the symptoms.  Food: Continue to avoid foods that are bothersome - melons, oranges, coconut, fresh tomatoes, avocado, pollock. For mild symptoms you can take over the counter antihistamines such as Benadryl and monitor symptoms closely. If symptoms worsen or if you have severe symptoms including breathing issues, throat closure, significant swelling, whole body hives, severe diarrhea and vomiting, lightheadedness then inject epinephrine and seek immediate medical care afterwards. Action plan in place.   Drugs: Continue to avoid drugs on your allergy list.   Skin: Continue proper skin care. Use desonide 0.05% ointment twice a day as needed for mild rash flares - okay to use on the face, neck, groin area. Do not use more than 1 week at a time. Use triamcinolone 0.1% ointment twice a day as needed for rash flares. Do not use on the face, neck, armpits or groin area. Do not use more than 3 weeks in a row.   Follow up in 6 months or sooner if needed.   Skin care recommendations  Bath time: Always use lukewarm water. AVOID very hot or cold water. Keep bathing time to 5-10 minutes. Do NOT use bubble bath. Use a mild soap and use just enough to wash  the dirty areas. Do NOT scrub skin vigorously.  After bathing, pat dry your skin with a towel. Do NOT rub or scrub the skin.  Moisturizers and prescriptions:  ALWAYS apply moisturizers immediately after bathing (within 3 minutes). This helps to lock-in moisture. Use the moisturizer several times a day over the whole body. Good summer moisturizers include: Aveeno, CeraVe, Cetaphil. Good winter moisturizers include: Aquaphor, Vaseline, Cerave, Cetaphil, Eucerin, Vanicream. When using moisturizers along with medications, the moisturizer should be applied about one hour after applying the medication to prevent diluting effect of the medication or moisturize around where you applied the medications. When not using medications, the moisturizer can be continued twice daily as maintenance.  Laundry and clothing: Avoid laundry products with added color or perfumes. Use unscented hypo-allergenic laundry products such as Tide free, Cheer free & gentle, and All free and clear.  If the skin still seems dry or sensitive, you can try double-rinsing the clothes. Avoid tight or scratchy clothing such as wool. Do not use fabric softeners or dyer sheets.

## 2022-02-27 NOTE — Assessment & Plan Note (Signed)
Broke out on her neck/lower face.  Continue proper skin care. Use desonide 0.05% ointment twice a day as needed for mild rash flares - okay to use on the face, neck, groin area. Do not use more than 1 week at a time. Use triamcinolone 0.1% ointment twice a day as needed for rash flares. Do not use on the face, neck, armpits or groin area. Do not use more than 3 weeks in a row.

## 2022-02-27 NOTE — Assessment & Plan Note (Signed)
Continue to avoid drugs on allergy list.

## 2022-03-03 DIAGNOSIS — Z419 Encounter for procedure for purposes other than remedying health state, unspecified: Secondary | ICD-10-CM | POA: Diagnosis not present

## 2022-03-13 ENCOUNTER — Inpatient Hospital Stay: Payer: Medicaid Other

## 2022-03-13 ENCOUNTER — Ambulatory Visit: Payer: Medicaid Other | Admitting: Family

## 2022-04-03 DIAGNOSIS — Z419 Encounter for procedure for purposes other than remedying health state, unspecified: Secondary | ICD-10-CM | POA: Diagnosis not present

## 2022-05-03 DIAGNOSIS — Z419 Encounter for procedure for purposes other than remedying health state, unspecified: Secondary | ICD-10-CM | POA: Diagnosis not present

## 2022-06-01 ENCOUNTER — Ambulatory Visit: Payer: BLUE CROSS/BLUE SHIELD | Admitting: Internal Medicine

## 2022-06-03 DIAGNOSIS — Z419 Encounter for procedure for purposes other than remedying health state, unspecified: Secondary | ICD-10-CM | POA: Diagnosis not present

## 2022-06-08 ENCOUNTER — Encounter: Payer: BLUE CROSS/BLUE SHIELD | Admitting: Internal Medicine

## 2022-06-22 ENCOUNTER — Ambulatory Visit (INDEPENDENT_AMBULATORY_CARE_PROVIDER_SITE_OTHER): Payer: BLUE CROSS/BLUE SHIELD | Admitting: Internal Medicine

## 2022-06-22 ENCOUNTER — Encounter: Payer: Self-pay | Admitting: Internal Medicine

## 2022-06-22 VITALS — BP 128/80 | HR 60 | Temp 98.3°F | Ht 67.0 in | Wt 264.0 lb

## 2022-06-22 DIAGNOSIS — Z7985 Long-term (current) use of injectable non-insulin antidiabetic drugs: Secondary | ICD-10-CM | POA: Diagnosis not present

## 2022-06-22 DIAGNOSIS — M25551 Pain in right hip: Secondary | ICD-10-CM

## 2022-06-22 DIAGNOSIS — E119 Type 2 diabetes mellitus without complications: Secondary | ICD-10-CM | POA: Diagnosis not present

## 2022-06-22 DIAGNOSIS — I2699 Other pulmonary embolism without acute cor pulmonale: Secondary | ICD-10-CM | POA: Diagnosis not present

## 2022-06-22 DIAGNOSIS — Z0001 Encounter for general adult medical examination with abnormal findings: Secondary | ICD-10-CM

## 2022-06-22 DIAGNOSIS — Z0000 Encounter for general adult medical examination without abnormal findings: Secondary | ICD-10-CM | POA: Diagnosis not present

## 2022-06-22 DIAGNOSIS — E538 Deficiency of other specified B group vitamins: Secondary | ICD-10-CM | POA: Diagnosis not present

## 2022-06-22 DIAGNOSIS — I10 Essential (primary) hypertension: Secondary | ICD-10-CM

## 2022-06-22 DIAGNOSIS — E559 Vitamin D deficiency, unspecified: Secondary | ICD-10-CM | POA: Diagnosis not present

## 2022-06-22 DIAGNOSIS — E7800 Pure hypercholesterolemia, unspecified: Secondary | ICD-10-CM

## 2022-06-22 DIAGNOSIS — Z01419 Encounter for gynecological examination (general) (routine) without abnormal findings: Secondary | ICD-10-CM

## 2022-06-22 LAB — LIPID PANEL
Cholesterol: 178 mg/dL (ref 0–200)
HDL: 40.2 mg/dL (ref >39.00)
LDL Cholesterol: 104 mg/dL — ABNORMAL HIGH (ref 0–99)
NonHDL: 138.24
Total CHOL/HDL Ratio: 4
Triglycerides: 173 mg/dL — ABNORMAL HIGH (ref 0.0–149.0)
VLDL: 34.6 mg/dL (ref 0.0–40.0)

## 2022-06-22 LAB — HEPATIC FUNCTION PANEL
ALT: 17 U/L (ref 0–35)
AST: 19 U/L (ref 0–37)
Albumin: 4.2 g/dL (ref 3.5–5.2)
Alkaline Phosphatase: 69 U/L (ref 39–117)
Bilirubin, Direct: 0.1 mg/dL (ref 0.0–0.3)
Total Bilirubin: 0.3 mg/dL (ref 0.2–1.2)
Total Protein: 7.7 g/dL (ref 6.0–8.3)

## 2022-06-22 LAB — BASIC METABOLIC PANEL
BUN: 15 mg/dL (ref 6–23)
CO2: 27 mEq/L (ref 19–32)
Calcium: 9.3 mg/dL (ref 8.4–10.5)
Chloride: 107 mEq/L (ref 96–112)
Creatinine, Ser: 0.8 mg/dL (ref 0.40–1.20)
GFR: 85.3 mL/min (ref >60.00)
Glucose, Bld: 84 mg/dL (ref 70–99)
Potassium: 3.9 mEq/L (ref 3.5–5.1)
Sodium: 142 mEq/L (ref 135–145)

## 2022-06-22 LAB — URINALYSIS, ROUTINE W REFLEX MICROSCOPIC
Bilirubin Urine: NEGATIVE
Hgb urine dipstick: NEGATIVE
Ketones, ur: NEGATIVE
Nitrite: NEGATIVE
RBC / HPF: NONE SEEN (ref 0–?)
Specific Gravity, Urine: 1.02 (ref 1.000–1.030)
Total Protein, Urine: NEGATIVE
Urine Glucose: NEGATIVE
Urobilinogen, UA: 0.2 (ref 0.0–1.0)
pH: 7 (ref 5.0–8.0)

## 2022-06-22 LAB — CBC WITH DIFFERENTIAL/PLATELET
Basophils Absolute: 0 10*3/uL (ref 0.0–0.1)
Basophils Relative: 0.3 % (ref 0.0–3.0)
Eosinophils Absolute: 0.3 10*3/uL (ref 0.0–0.7)
Eosinophils Relative: 6.4 % — ABNORMAL HIGH (ref 0.0–5.0)
HCT: 37.2 % (ref 36.0–46.0)
Hemoglobin: 11.8 g/dL — ABNORMAL LOW (ref 12.0–15.0)
Lymphocytes Relative: 44.9 % (ref 12.0–46.0)
Lymphs Abs: 2.3 10*3/uL (ref 0.7–4.0)
MCHC: 31.8 g/dL (ref 30.0–36.0)
MCV: 83.9 fl (ref 78.0–100.0)
Monocytes Absolute: 0.3 10*3/uL (ref 0.1–1.0)
Monocytes Relative: 6.4 % (ref 3.0–12.0)
Neutro Abs: 2.2 10*3/uL (ref 1.4–7.7)
Neutrophils Relative %: 42 % — ABNORMAL LOW (ref 43.0–77.0)
Platelets: 168 10*3/uL (ref 150.0–400.0)
RBC: 4.43 Mil/uL (ref 3.87–5.11)
RDW: 15.1 % (ref 11.5–15.5)
WBC: 5.2 10*3/uL (ref 4.0–10.5)

## 2022-06-22 LAB — TSH: TSH: 1.83 u[IU]/mL (ref 0.35–5.50)

## 2022-06-22 LAB — VITAMIN B12: Vitamin B-12: 216 pg/mL (ref 211–911)

## 2022-06-22 LAB — VITAMIN D 25 HYDROXY (VIT D DEFICIENCY, FRACTURES): VITD: 9 ng/mL — ABNORMAL LOW (ref 30.00–100.00)

## 2022-06-22 LAB — HEMOGLOBIN A1C: Hgb A1c MFr Bld: 6.6 % — ABNORMAL HIGH (ref 4.6–6.5)

## 2022-06-22 MED ORDER — TIRZEPATIDE 2.5 MG/0.5ML ~~LOC~~ SOAJ: 2.5000 mg | SUBCUTANEOUS | 11 refills | Status: DC

## 2022-06-22 NOTE — Progress Notes (Signed)
Patient ID: Hannah Thompson, female   DOB: 21-Aug-1970, 52 y.o.   MRN: [id number]         Chief Complaint:: wellness exam and hx of DVT PE, low b12, hld, htn, dm,, low vit d       HPI:  Hannah Thompson is a 52 y.o. female here for wellness exam; pt plans to call for eye exam, due for GYN for pap and mammogram, for shingrix at pharmacy, declines covid booster, o/w up to date                        Also lost to f/u with heme.  Struggling with wt loss and DM recently.  Also has new worsening right hip pain in last 3 wks with popping out it seems with walking and a couple of near falls.  Pt denies chest pain, increased sob or doe, wheezing, orthopnea, PND, increased LE swelling, palpitations, dizziness or syncope.   Pt denies polydipsia, polyuria, or new focal neuro s/s.    Pt denies fever, wt loss, night sweats, loss of appetite, or other constitutional symptoms     Wt Readings from Last 3 Encounters:  06/22/22 264 lb (119.7 kg)  02/27/22 259 lb 3.2 oz (117.6 kg)  11/30/21 255 lb (115.7 kg)   BP Readings from Last 3 Encounters:  06/22/22 128/80  02/27/22 132/82  12/04/21 119/75   Immunization History  Administered Date(s) Administered   Influenza,inj,Quad PF,6+ Mos 10/18/2016, 11/07/2019, 11/29/2020   PFIZER(Purple Top)SARS-COV-2 Vaccination 04/03/2019, 05/05/2019   Tdap 12/19/2013   Health Maintenance Due  Topic Date Due   OPHTHALMOLOGY EXAM  Never done   PAP SMEAR-Modifier  10/02/2015   MAMMOGRAM  01/12/2021   Zoster Vaccines- Shingrix (1 of 2) Never done   COVID-19 Vaccine (3 - 2023-24 season) 09/02/2021      Past Medical History:  Diagnosis Date   ALLERGIC RHINITIS 03/17/2009   Anemia    ASTHMA 03/17/2009   DVT (deep venous thrombosis) (HCC)    ECZEMA 03/17/2009   Headache(784.0) 06/21/2009   recurrent   HYPERLIPIDEMIA 03/17/2009   KELOID 06/21/2009   Pulmonary emboli (HCC)    VITAMIN B12 DEFICIENCY 03/18/2009   Past Surgical History:  Procedure Laterality Date   KELOID  EXCISION  mid 1990's   s/p keloid removal-laser     reports that she quit smoking about 14 years ago. Her smoking use included cigarettes. She started smoking about 18 years ago. She has a 1.00 pack-year smoking history. She has never been exposed to tobacco smoke. She has never used smokeless tobacco. She reports current alcohol use. She reports that she does not use drugs. family history includes Alcohol abuse in an other family member; Allergies in her sister; Angioedema in her sister; Cancer in her father, paternal aunt, and another family member; Deep vein thrombosis in her sister; Diabetes in her mother and another family member; Eczema in her sister; Heart disease in an other family member; Hypertension in her father and mother. Allergies  Allergen Reactions   Avocado Nausea And Vomiting    Projectile N/V   Fish Allergy Anaphylaxis and Swelling    Pollock.Swelling of lips, throat and face.   Oxaprozin     angioedema   Sulfonamide Derivatives     REACTION: hives   Latex Rash   Orange Juice [Orange Oil] Hives    Orange juice   Tomato Hives    tomatoes   Zyrtec [Cetirizine] Hives and Rash  Current Outpatient Medications on File Prior to Visit  Medication Sig Dispense Refill   cromolyn (OPTICROM) 4 % ophthalmic solution Place 1 drop into both eyes 4 (four) times daily as needed (itchy/watery eyes). 10 mL 1   dabigatran (PRADAXA) 150 MG CAPS capsule TAKE 1 CAPSULE BY MOUTH EVERY 12 HOURS 60 capsule 0   desonide (DESOWEN) 0.05 % ointment Apply 1 Application topically 2 (two) times daily as needed (mild rash flare). Okay to use on the face, neck, groin area. Do not use more than 1 week at a time. 60 g 2   EPINEPHrine 0.3 mg/0.3 mL IJ SOAJ injection Inject 0.3 mg into the muscle as needed for anaphylaxis. 2 each 1   fluticasone (FLONASE) 50 MCG/ACT nasal spray Place 1 spray into both nostrils daily. 16 g 5   levocetirizine (XYZAL) 5 MG tablet Take 1 tablet (5 mg total) by mouth 2 (two)  times daily as needed for allergies (Can take an extra dose during flare ups.). 60 tablet 5   montelukast (SINGULAIR) 10 MG tablet Take 1 tablet (10 mg total) by mouth at bedtime as needed (allergies). 30 tablet 5   triamcinolone ointment (KENALOG) 0.1 % Apply 1 application topically 2 (two) times daily as needed (rash flare). Do not use on the face, neck, armpits or groin area. Do not use more than 3 weeks in a row. 30 g 1   No current facility-administered medications on file prior to visit.        ROS:  All others reviewed and negative.  Objective        PE:  BP 128/80 (BP Location: Right Arm, Patient Position: Sitting, Cuff Size: Normal)   Pulse 60   Temp 98.3 F (36.8 C) (Oral)   Ht 5\' 7"  (1.702 m)   Wt 264 lb (119.7 kg)   LMP  (LMP Unknown)   SpO2 98%   BMI 41.35 kg/m                 Constitutional: Pt appears in NAD               HENT: Head: NCAT.                Right Ear: External ear normal.                 Left Ear: External ear normal.                Eyes: . Pupils are equal, round, and reactive to light. Conjunctivae and EOM are normal               Nose: without d/c or deformity               Neck: Neck supple. Gross normal ROM               Cardiovascular: Normal rate and regular rhythm.                 Pulmonary/Chest: Effort normal and breath sounds without rales or wheezing.                Abd:  Soft, NT, ND, + BS, no organomegaly               Neurological: Pt is alert. At baseline orientation, motor grossly intact               Skin: Skin is warm. No rashes, no other new lesions, LE edema - none  Psychiatric: Pt behavior is normal without agitation   Micro: none  Cardiac tracings I have personally interpreted today:  none  Pertinent Radiological findings (summarize): none   Lab Results  Component Value Date   WBC 5.2 06/22/2022   HGB 11.8 (L) 06/22/2022   HCT 37.2 06/22/2022   PLT 168.0 06/22/2022   GLUCOSE 84 06/22/2022   CHOL 178  06/22/2022   TRIG 173.0 (H) 06/22/2022   HDL 40.20 06/22/2022   LDLCALC 104 (H) 06/22/2022   ALT 17 06/22/2022   AST 19 06/22/2022   NA 142 06/22/2022   K 3.9 06/22/2022   CL 107 06/22/2022   CREATININE 0.80 06/22/2022   BUN 15 06/22/2022   CO2 27 06/22/2022   TSH 1.83 06/22/2022   INR 1.01 12/09/2017   HGBA1C 6.6 (H) 06/22/2022   MICROALBUR 1.6 06/22/2022   Assessment/Plan:  SHAQUINTA PERUSKI is a 52 y.o. Black or African American [2] female with  has a past medical history of ALLERGIC RHINITIS (03/17/2009), Anemia, ASTHMA (03/17/2009), DVT (deep venous thrombosis) (HCC), ECZEMA (03/17/2009), Headache(784.0) (06/21/2009), HYPERLIPIDEMIA (03/17/2009), KELOID (06/21/2009), Pulmonary emboli (HCC), and VITAMIN B12 DEFICIENCY (03/18/2009).  Recurrent pulmonary embolism (HCC) Has been lost to f/u with Dr Myna Hidalgo - ok for refer back  Encounter for well adult exam with abnormal findings Age and sex appropriate education and counseling updated with regular exercise and diet Referrals for preventative services - for GYN referral for pap, mammogram, pt to call for eye exam Immunizations addressed - for shingrix at pharmacy, declines covid booster Smoking counseling  - none needed Evidence for depression or other mood disorder - none significant Most recent labs reviewed. I have personally reviewed and have noted: 1) the patient's medical and social history 2) The patient's current medications and supplements 3) The patient's height, weight, and BMI have been recorded in the chart   B12 deficiency Lab Results  Component Value Date   VITAMINB12 216 06/22/2022   Low, to start oral replacement - b12 1000 mcg qd   Hyperlipidemia Lab Results  Component Value Date   LDLCALC 104 (H) 06/22/2022   Uncontrolled, goal ldl < 70, declines statin for now, for lower chol diet   Hypertension BP Readings from Last 3 Encounters:  06/22/22 128/80  02/27/22 132/82  12/04/21 119/75   Stable, pt to  continue medical treatment  - diet, wt control   Diabetes (HCC) Lab Results  Component Value Date   HGBA1C 6.6 (H) 06/22/2022   Uncontrolled with obesity, pt to continue current medical treatment  - mounjaro 2.5 mg weekly   Vitamin D deficiency Last vitamin D Lab Results  Component Value Date   VD25OH 9.00 (L) 06/22/2022   Low, to start oral replacement   Right hip pain With recent worsening, high risk of fall - for referral ortho  Followup: Return in about 6 months (around 12/22/2022).  Oliver Barre, MD 06/24/2022 7:49 PM [facility]

## 2022-06-22 NOTE — Patient Instructions (Signed)
Please take all new medication as prescribed - the mounjaro 2.5 mg weekly  Please call near the end of 1 month if you are doing well with the mounjaro for an increase to the 5 mg weekly  Please continue all other medications as before, and refills have been done if requested.  Please have the pharmacy call with any other refills you may need.  Please continue your efforts at being more active, low cholesterol diet, and weight control.  You are otherwise up to date with prevention measures today.  Please keep your appointments with your specialists as you may have planned  You will be contacted regarding the referral for: Orthopedic for the right hip, Dr Myna Hidalgo, and GYN Dr Cherly Hensen  Please go to the LAB at the blood drawing area for the tests to be done  You will be contacted by phone if any changes need to be made immediately.  Otherwise, you will receive a letter about your results with an explanation, but please check with MyChart first.  Please remember to sign up for MyChart if you have not done so, as this will be important to you in the future with finding out test results, communicating by private email, and scheduling acute appointments online when needed.  Please make an Appointment to return in 6 months, or sooner if needed

## 2022-06-23 LAB — MICROALBUMIN / CREATININE URINE RATIO
Creatinine,U: 148.1 mg/dL
Microalb Creat Ratio: 1.1 mg/g (ref 0.0–30.0)
Microalb, Ur: 1.6 mg/dL (ref 0.0–1.9)

## 2022-06-23 NOTE — Progress Notes (Signed)
The test results show that your current treatment is OK, as the tests are stable.  Please continue the same plan.  There is no other need for change of treatment or further evaluation based on these results, at this time.  thanks 

## 2022-06-24 ENCOUNTER — Encounter: Payer: Self-pay | Admitting: Internal Medicine

## 2022-06-24 NOTE — Assessment & Plan Note (Signed)
Last vitamin D Lab Results  Component Value Date   VD25OH 9.00 (L) 06/22/2022   Low, to start oral replacement

## 2022-06-24 NOTE — Assessment & Plan Note (Signed)
BP Readings from Last 3 Encounters:  06/22/22 128/80  02/27/22 132/82  12/04/21 119/75   Stable, pt to continue medical treatment  - diet, wt control

## 2022-06-24 NOTE — Assessment & Plan Note (Signed)
With recent worsening, high risk of fall - for referral ortho

## 2022-06-24 NOTE — Assessment & Plan Note (Signed)
Lab Results  Component Value Date   HGBA1C 6.6 (H) 06/22/2022   Uncontrolled with obesity, pt to continue current medical treatment  - mounjaro 2.5 mg weekly

## 2022-06-24 NOTE — Assessment & Plan Note (Signed)
Lab Results  Component Value Date   LDLCALC 104 (H) 06/22/2022   Uncontrolled, goal ldl < 70, declines statin for now, for lower chol diet

## 2022-06-24 NOTE — Assessment & Plan Note (Signed)
Lab Results  Component Value Date   VITAMINB12 216 06/22/2022   Low, to start oral replacement - b12 1000 mcg qd

## 2022-06-24 NOTE — Assessment & Plan Note (Signed)
Has been lost to f/u with Dr Myna Hidalgo - ok for refer back

## 2022-06-24 NOTE — Assessment & Plan Note (Signed)
Age and sex appropriate education and counseling updated with regular exercise and diet Referrals for preventative services - for GYN referral for pap, mammogram, pt to call for eye exam Immunizations addressed - for shingrix at pharmacy, declines covid booster Smoking counseling  - none needed Evidence for depression or other mood disorder - none significant Most recent labs reviewed. I have personally reviewed and have noted: 1) the patient's medical and social history 2) The patient's current medications and supplements 3) The patient's height, weight, and BMI have been recorded in the chart

## 2022-06-28 ENCOUNTER — Other Ambulatory Visit (HOSPITAL_COMMUNITY): Payer: Self-pay

## 2022-06-28 ENCOUNTER — Telehealth: Payer: Self-pay

## 2022-06-28 NOTE — Telephone Encounter (Signed)
Patient Advocate Encounter   Received notification from [facility] that prior authorization is required for [facility] 2.5MG /0.5ML pen-injectors   Submitted: 06-28-2022 Key [id number]  Status is pending

## 2022-07-03 DIAGNOSIS — Z419 Encounter for procedure for purposes other than remedying health state, unspecified: Secondary | ICD-10-CM | POA: Diagnosis not present

## 2022-07-04 NOTE — Telephone Encounter (Signed)
Pharmacy Patient Advocate Encounter  Received notification from [facility] [place] that the request for prior authorization for Hannah Thompson has been denied due to the request does not meet the definition of medical necessity found in the member's benefit booklet. The member must meet one of the following have tried a medication containing metformin, sulfonylureas, or insulin and it did not work, have a medical reason why they cannot take metformin, the member is currently being treated with the medication within the past 90 days and is at risk of harm if the medication is changed, them member is being treated for or is at high rist of heart failure, chronic kidney disease or atherosclerotic cardiovascular disease.     Please be advised we currently do not have a Pharmacist to review denials, therefore you will need to process appeals accordingly as needed. Thanks for your support at this time.   You may call [phone] or fax [phone], to appeal.   Denial letter attached to chart

## 2022-07-10 ENCOUNTER — Telehealth: Payer: Self-pay | Admitting: Internal Medicine

## 2022-07-10 NOTE — Telephone Encounter (Signed)
[facility] you sent a referral on [place], W she has Medicaid, Dr Sherryl Manges doesn't participate Kyla Balzarine from office called.

## 2022-07-11 NOTE — Telephone Encounter (Signed)
This was a referred pt and yes she's not ours (but [facility] handled it)

## 2022-07-11 NOTE — Telephone Encounter (Signed)
Called to get some clarification on this note. Pt reports she did not call us.   I see this is not one of our pts.  Can you help me understand?

## 2022-07-14 ENCOUNTER — Other Ambulatory Visit: Payer: Self-pay | Admitting: Family

## 2022-07-14 ENCOUNTER — Inpatient Hospital Stay: Payer: Medicaid Other | Attending: Hematology & Oncology

## 2022-07-14 ENCOUNTER — Encounter: Payer: Self-pay | Admitting: Family

## 2022-07-14 ENCOUNTER — Inpatient Hospital Stay (HOSPITAL_BASED_OUTPATIENT_CLINIC_OR_DEPARTMENT_OTHER): Payer: Medicaid Other | Admitting: Family

## 2022-07-14 DIAGNOSIS — I2692 Saddle embolus of pulmonary artery without acute cor pulmonale: Secondary | ICD-10-CM | POA: Diagnosis not present

## 2022-07-14 DIAGNOSIS — I82491 Acute embolism and thrombosis of other specified deep vein of right lower extremity: Secondary | ICD-10-CM

## 2022-07-14 DIAGNOSIS — R0602 Shortness of breath: Secondary | ICD-10-CM | POA: Diagnosis not present

## 2022-07-14 DIAGNOSIS — D696 Thrombocytopenia, unspecified: Secondary | ICD-10-CM | POA: Diagnosis not present

## 2022-07-14 DIAGNOSIS — Z7902 Long term (current) use of antithrombotics/antiplatelets: Secondary | ICD-10-CM | POA: Diagnosis not present

## 2022-07-14 DIAGNOSIS — I82403 Acute embolism and thrombosis of unspecified deep veins of lower extremity, bilateral: Secondary | ICD-10-CM | POA: Diagnosis not present

## 2022-07-14 DIAGNOSIS — Z86711 Personal history of pulmonary embolism: Secondary | ICD-10-CM | POA: Insufficient documentation

## 2022-07-14 DIAGNOSIS — I2699 Other pulmonary embolism without acute cor pulmonale: Secondary | ICD-10-CM

## 2022-07-14 DIAGNOSIS — D72819 Decreased white blood cell count, unspecified: Secondary | ICD-10-CM | POA: Insufficient documentation

## 2022-07-14 LAB — CMP (CANCER CENTER ONLY)
ALT: 13 U/L (ref 0–44)
AST: 16 U/L (ref 15–41)
Albumin: 4.2 g/dL (ref 3.5–5.0)
Alkaline Phosphatase: 68 U/L (ref 38–126)
Anion gap: 7 (ref 5–15)
BUN: 18 mg/dL (ref 6–20)
CO2: 29 mmol/L (ref 22–32)
Calcium: 9.4 mg/dL (ref 8.9–10.3)
Chloride: 105 mmol/L (ref 98–111)
Creatinine: 0.79 mg/dL (ref 0.44–1.00)
GFR, Estimated: >60 mL/min (ref >60)
Glucose, Bld: 107 mg/dL — ABNORMAL HIGH (ref 70–99)
Potassium: 3.7 mmol/L (ref 3.5–5.1)
Sodium: 141 mmol/L (ref 135–145)
Total Bilirubin: 0.3 mg/dL (ref 0.3–1.2)
Total Protein: 7.5 g/dL (ref 6.5–8.1)

## 2022-07-14 LAB — CBC WITH DIFFERENTIAL (CANCER CENTER ONLY)
Abs Immature Granulocytes: 0.01 10*3/uL (ref 0.00–0.07)
Basophils Absolute: 0 10*3/uL (ref 0.0–0.1)
Basophils Relative: 1 %
Eosinophils Absolute: 0.5 10*3/uL (ref 0.0–0.5)
Eosinophils Relative: 9 %
HCT: 35 % — ABNORMAL LOW (ref 36.0–46.0)
Hemoglobin: 11.3 g/dL — ABNORMAL LOW (ref 12.0–15.0)
Immature Granulocytes: 0 %
Lymphocytes Relative: 42 %
Lymphs Abs: 2.5 10*3/uL (ref 0.7–4.0)
MCH: 27.6 pg (ref 26.0–34.0)
MCHC: 32.3 g/dL (ref 30.0–36.0)
MCV: 85.4 fL (ref 80.0–100.0)
Monocytes Absolute: 0.3 10*3/uL (ref 0.1–1.0)
Monocytes Relative: 6 %
Neutro Abs: 2.4 10*3/uL (ref 1.7–7.7)
Neutrophils Relative %: 42 %
Platelet Count: 173 10*3/uL (ref 150–400)
RBC: 4.1 MIL/uL (ref 3.87–5.11)
RDW: 15.2 % (ref 11.5–15.5)
WBC Count: 5.8 10*3/uL (ref 4.0–10.5)
nRBC: 0 % (ref 0.0–0.2)

## 2022-07-14 LAB — LACTATE DEHYDROGENASE: LDH: 180 U/L (ref 98–192)

## 2022-07-14 MED ORDER — DABIGATRAN ETEXILATE MESYLATE 150 MG PO CAPS: ORAL_CAPSULE | ORAL | 6 refills | Status: DC

## 2022-07-14 NOTE — Progress Notes (Signed)
Hematology and Oncology Follow Up Visit  Hannah Thompson [id number] 01/02/1971 52 y.o. 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 She is doing well but had a couple episodes of SOB and chest discomfort after stopping her Pradaxa on her own. These symptoms have since resolved but she wanted to come back to see [place] and get back onto her medication as prescribed.  No new thrombotic events since we last say her in 07/2020.  No issue with blood loss. No bruising or petechiae.  No fever, chills, n/v, cough, rash, dizziness, SOB, chest pain, palpitations, abdominal pain or changes in bowel or bladder habits.  No swelling, tenderness, numbness or tingling in her extremities at this time.  No falls or syncope.  Appetite and hydration are good. Weight is stable at 270 lbs.   ECOG Performance Status: 0 - Asymptomatic  Medications:  Allergies as of 07/14/2022       Reactions   Avocado Nausea And Vomiting   Projectile N/V   Fish Allergy Anaphylaxis, Swelling   Pollock.Swelling of lips, throat and face.   Oxaprozin    angioedema   Sulfonamide Derivatives    REACTION: hives   Latex Rash   Orange Juice [orange Oil] Hives   Orange juice   Tomato Hives   tomatoes   Zyrtec [cetirizine] Hives, Rash        Medication List        Accurate as of July 14, 2022  4:06 PM. If you have any questions, ask your nurse or doctor.          cromolyn 4 % ophthalmic solution Commonly known as: OPTICROM Place 1 drop into both eyes 4 (four) times daily as needed (itchy/watery eyes).   dabigatran 150 MG Caps capsule Commonly known as: Pradaxa TAKE 1 CAPSULE BY MOUTH EVERY 12 HOURS   desonide 0.05 %  ointment Commonly known as: DESOWEN Apply 1 Application topically 2 (two) times daily as needed (mild rash flare). Okay to use on the face, neck, groin area. Do not use more than 1 week at a time.   EPINEPHrine 0.3 mg/0.3 mL Soaj injection Commonly known as: EPI-PEN Inject 0.3 mg into the muscle as needed for anaphylaxis.   fluticasone 50 MCG/ACT nasal spray Commonly known as: FLONASE Place 1 spray into both nostrils daily.   levocetirizine 5 MG tablet Commonly known as: XYZAL Take 1 tablet (5 mg total) by mouth 2 (two) times daily as needed for allergies (Can take an extra dose during flare ups.).   montelukast 10 MG tablet Commonly known as: Singulair Take 1 tablet (10 mg total) by mouth at bedtime as needed (allergies).   tirzepatide 2.5 MG/0.5ML Pen Commonly known as: MOUNJARO Inject 2.5 mg into the skin once a week.   triamcinolone ointment 0.1 % Commonly known as: KENALOG Apply 1 application topically 2 (two) times daily as needed (rash flare). Do not use on the face, neck, armpits or groin area. Do not use more than 3 weeks in a row.        Allergies:  Allergies  Allergen Reactions   Avocado Nausea And Vomiting    Projectile N/V   Fish Allergy Anaphylaxis and Swelling  Pollock.Swelling of lips, throat and face.   Oxaprozin     angioedema   Sulfonamide Derivatives     REACTION: hives   Latex Rash   Orange Juice [Orange Oil] Hives    Orange juice   Tomato Hives    tomatoes   Zyrtec [Cetirizine] Hives and Rash    Past Medical History, Surgical history, Social history, and Family History were reviewed and updated.  Review of Systems: All other 10 point review of systems is negative.   Physical Exam:  weight is 270 lb 1.9 oz (122.5 kg). Her oral temperature is 97.7 F (36.5 C). Her blood pressure is 124/91 (abnormal) and her pulse is 70. Her respiration is 18 and oxygen saturation is 99%.   Wt Readings from Last 3 Encounters:  07/14/22 270 lb 1.9 oz  (122.5 kg)  06/22/22 264 lb (119.7 kg)  02/27/22 259 lb 3.2 oz (117.6 kg)    Ocular: Sclerae unicteric, pupils equal, round and reactive to light Ear-nose-throat: Oropharynx clear, dentition fair Lymphatic: No cervical or supraclavicular adenopathy Lungs no rales or rhonchi, good excursion bilaterally Heart regular rate and rhythm, no murmur appreciated Abd soft, nontender, positive bowel sounds MSK no focal spinal tenderness, no joint edema Neuro: non-focal, well-oriented, appropriate affect Breasts: Deferred   Lab Results  Component Value Date   WBC 5.8 07/14/2022   HGB 11.3 (L) 07/14/2022   HCT 35.0 (L) 07/14/2022   MCV 85.4 07/14/2022   PLT 173 07/14/2022   Lab Results  Component Value Date   FERRITIN 242 08/16/2015   IRON 76 08/16/2015   TIBC 251 08/16/2015   UIBC 175 08/16/2015   IRONPCTSAT 30 08/16/2015   Lab Results  Component Value Date   RBC 4.10 07/14/2022   No results found for: "KPAFRELGTCHN", "LAMBDASER", "KAPLAMBRATIO" No results found for: "IGGSERUM", "IGA", "IGMSERUM" No results found for: "TOTALPROTELP", "ALBUMINELP", "A1GS", "A2GS", "BETS", "BETA2SER", "GAMS", "MSPIKE", "SPEI"   Chemistry      Component Value Date/Time   NA 141 07/14/2022 1516   NA 139 10/23/2016 1306   NA 140 09/25/2016 1145   K 3.7 07/14/2022 1516   K 3.8 10/23/2016 1306   K 4.1 09/25/2016 1145   CL 105 07/14/2022 1516   CL 107 10/23/2016 1306   CO2 29 07/14/2022 1516   CO2 26 10/23/2016 1306   CO2 28 09/25/2016 1145   BUN 18 07/14/2022 1516   BUN 8 10/23/2016 1306   BUN 9.5 09/25/2016 1145   CREATININE 0.79 07/14/2022 1516   CREATININE 0.8 10/23/2016 1306   CREATININE 0.8 09/25/2016 1145      Component Value Date/Time   CALCIUM 9.4 07/14/2022 1516   CALCIUM 8.7 10/23/2016 1306   CALCIUM 9.6 09/25/2016 1145   ALKPHOS 68 07/14/2022 1516   ALKPHOS 67 10/23/2016 1306   ALKPHOS 72 09/25/2016 1145   AST 16 07/14/2022 1516   AST 17 09/25/2016 1145   ALT 13 07/14/2022  1516   ALT 26 10/23/2016 1306   ALT 11 09/25/2016 1145   BILITOT 0.3 07/14/2022 1516   BILITOT 0.26 09/25/2016 1145       Impression and Plan: Ms. Dillen is a very pleasant 52 yo African American female with significant history of thromboembolic disease with a pulmonary embolism with right heart strain as well as bilateral lower extremity DVT's (while off of Pradaxa for 2 weeks).  She is now back to re-establish care and get back on to Pradaxa.  Script refilled.  Follow-up in 6 months.  She  will contact our office with any questions or concerns.   Eileen Stanford, NP 7/12/20244:06 PM

## 2022-07-21 ENCOUNTER — Telehealth: Payer: Self-pay | Admitting: Internal Medicine

## 2022-07-21 NOTE — Telephone Encounter (Signed)
Pt called wanting referral resent so she can schedule  appt with them. Please advise.

## 2022-07-21 NOTE — Telephone Encounter (Signed)
Called pt inform her of the 3 referrals. Gave her Dr. Cherly Hensen # and to contact [facility] they have been trying to call her.Marland KitchenRaechel Thompson

## 2022-07-21 NOTE — Telephone Encounter (Signed)
Unfortunatey on June 20 we did 3 referrals  Please clarify which one   thanks

## 2022-07-25 ENCOUNTER — Telehealth: Payer: Self-pay | Admitting: Internal Medicine

## 2022-07-25 DIAGNOSIS — Z1211 Encounter for screening for malignant neoplasm of colon: Secondary | ICD-10-CM

## 2022-07-25 NOTE — Telephone Encounter (Signed)
Patient called and said she was supposed to get a colonoscopy a while ago and never did. She wanted to know if Dr. Jonny Ruiz could put in a referral for her to get that done. Best callback is [phone].

## 2022-07-25 NOTE — Telephone Encounter (Signed)
Ok this referral is done, thanks

## 2022-08-03 DIAGNOSIS — Z419 Encounter for procedure for purposes other than remedying health state, unspecified: Secondary | ICD-10-CM | POA: Diagnosis not present

## 2022-08-08 ENCOUNTER — Ambulatory Visit: Payer: Medicaid Other | Admitting: Orthopaedic Surgery

## 2022-08-10 ENCOUNTER — Encounter: Payer: Self-pay | Admitting: Physician Assistant

## 2022-08-10 ENCOUNTER — Ambulatory Visit: Payer: Medicaid Other | Admitting: Physician Assistant

## 2022-08-10 ENCOUNTER — Telehealth: Payer: Self-pay | Admitting: Internal Medicine

## 2022-08-10 ENCOUNTER — Ambulatory Visit (INDEPENDENT_AMBULATORY_CARE_PROVIDER_SITE_OTHER): Payer: Medicaid Other | Admitting: Sports Medicine

## 2022-08-10 ENCOUNTER — Ambulatory Visit (INDEPENDENT_AMBULATORY_CARE_PROVIDER_SITE_OTHER): Payer: Medicaid Other

## 2022-08-10 ENCOUNTER — Ambulatory Visit (INDEPENDENT_AMBULATORY_CARE_PROVIDER_SITE_OTHER): Payer: Medicaid Other | Admitting: Physician Assistant

## 2022-08-10 ENCOUNTER — Other Ambulatory Visit: Payer: Self-pay

## 2022-08-10 VITALS — Ht 67.0 in | Wt 265.0 lb

## 2022-08-10 DIAGNOSIS — M25551 Pain in right hip: Secondary | ICD-10-CM

## 2022-08-10 DIAGNOSIS — M1611 Unilateral primary osteoarthritis, right hip: Secondary | ICD-10-CM

## 2022-08-10 MED ORDER — METHYLPREDNISOLONE ACETATE 40 MG/ML IJ SUSP
80.0000 mg | INTRAMUSCULAR | Status: AC | PRN
  Administered 2022-08-10: 80 mg via INTRA_ARTICULAR

## 2022-08-10 MED ORDER — LIDOCAINE HCL 1 % IJ SOLN
4.0000 mL | INTRAMUSCULAR | Status: AC | PRN
  Administered 2022-08-10: 4 mL

## 2022-08-10 NOTE — Telephone Encounter (Signed)
Patient states that she didn't prick up the prescriptions in time and that they will need to be sent in again  [facility]   Pharmacy:  [facility] [address]  Patients states it will probably need a prior authorization

## 2022-08-10 NOTE — Progress Notes (Signed)
   Procedure Note  Patient: ARVETA SCHLOSSER             Date of Birth: 1970-09-01           MRN: [id number]             Visit Date: 08/10/2022  Procedures: Visit Diagnoses:  1. Pain in right hip   2. Unilateral primary osteoarthritis, right hip    Large Joint Inj: R hip joint on 08/10/2022 11:19 AM Indications: pain Details: 22 G 3.5 in needle, ultrasound-guided anterior approach Medications: 4 mL lidocaine 1 %; 80 mg methylPREDNISolone acetate 40 MG/ML Outcome: tolerated well, no immediate complications  Procedure: US-guided intra-articular hip injection, right After discussion on risks/benefits/indications and informed verbal consent was obtained, a timeout was performed. Patient was lying supine on exam table. The hip was cleaned with betadine and alcohol swabs. Then utilizing ultrasound guidance, the patient's femoral head and neck junction was identified and subsequently injected with 4:2 lidocaine:depomedrol via an in-plane approach with ultrasound visualization of the injectate administered into the hip joint. Patient tolerated procedure well without immediate complications.  Procedure, treatment alternatives, risks and benefits explained, specific risks discussed. Consent was given by the patient. Immediately prior to procedure a time out was called to verify the correct patient, procedure, equipment, support staff and site/side marked as required. Patient was prepped and draped in the usual sterile fashion.     - I evaluated the patient about 5 minutes post-injection and she was doing well with already improvement in pain and ROM - follow-up with Mikey Kirschner as indicated; I am happy to see them as needed  Madelyn Brunner, DO Primary Care Sports Medicine Physician  [facility] - Orthopedics  This note was dictated using Dragon naturally speaking software and may contain errors in syntax, spelling, or content which have not been identified prior to signing this note.

## 2022-08-10 NOTE — Progress Notes (Signed)
Office Visit Note   Patient: Hannah Thompson           Date of Birth: 02/09/1970           MRN: [id number] Visit Date: 08/10/2022              Requested by: Corwin Levins, MD [address] [place],  [place] [address] PCP: Corwin Levins, MD   Assessment & Plan: Visit Diagnoses:  1. Unilateral primary osteoarthritis, right hip     Plan: Impression is advanced degenerative joint disease right hip.  Today, we discussed various treatment options to include cortisone injection versus total hip arthroplasty.  She would like to proceed with cortisone injection at this time.  She will continue to work on weight loss.  Total hip replacement packet provided.  Follow-up as needed.  Follow-Up Instructions: Return if symptoms worsen or fail to improve.   Orders:  Orders Placed This Encounter  Procedures   XR HIP UNILAT W OR W/O PELVIS 2-3 VIEWS RIGHT   No orders of the defined types were placed in this encounter.     Procedures: No procedures performed   Clinical Data: No additional findings.   Subjective: Chief Complaint  Patient presents with   Right Hip - Pain    HPI patient is a very pleasant 52 year old female who comes in today with right hip pain for the past 2 years that has progressively worsened.  The pain she has is all to the groin.  Symptoms occur when she is going from a seated to standing position.  She takes Tylenol which minimally helps.  She was told years ago that she has a labral tear.  No previous cortisone injections of the right hip.  Review of Systems as detailed in HPI.  All others reviewed and are negative.   Objective: Vital Signs: LMP  (LMP Unknown)   Physical Exam well-developed well-nourished female no acute distress.  Alert and oriented x 3.  Ortho Exam right hip exam reveals pain with FADIR and logroll.  Increased pain with resisted hip flexion.  She is neurovascular intact distally.  Hip  Specialty Comments:  No specialty comments  available.  Imaging: XR HIP UNILAT W OR W/O PELVIS 2-3 VIEWS RIGHT  Result Date: 08/10/2022 X-rays demonstrate advanced degenerative changes para-articular osteophyte formation    PMFS History: Patient Active Problem List   Diagnosis Date Noted   Right hip pain 06/22/2022   Abscess, gluteal, left 11/30/2021   Seasonal and perennial allergic rhinoconjunctivitis 02/16/2021   Urinary frequency 12/05/2020   Left knee pain 10/01/2020   Food allergy 08/10/2020   Multiple drug allergies 08/10/2020   Oral allergy syndrome, subsequent encounter 08/10/2020   Other atopic dermatitis 08/10/2020   Toe pain, right 05/31/2020   Vitamin D deficiency 05/25/2020   Costochondritis 03/01/2020   Left hand pain 03/01/2020   Abnormal cervical Papanicolaou smear 02/25/2020   Diabetes (HCC) 06/20/2019   Pulmonary embolism (HCC) 12/10/2017   Recurrent pulmonary embolism (HCC) 02/26/2017   Hypertension 10/17/2016   Hypokalemia 10/17/2016   Toxic conjunctivitis, left 08/12/2016   Encounter for well adult exam with abnormal findings 06/27/2016   Acute pain of right shoulder 06/27/2016   Foreign body in right foot 03/27/2016   Grade 2 ankle sprain 07/27/2015   Headache 04/27/2015   Hyperpigmentation 03/01/2015   Skin trauma 03/01/2015   Medial meniscus tear 01/13/2015   Effusion of left knee 01/13/2015   Encounter for therapeutic drug monitoring 07/07/2014  Pedal edema 04/28/2014   Chronic low back pain 02/03/2014   Burn of right foot 12/19/2013   Cellulitis 11/21/2013   Therapeutic drug monitoring 11/21/2013   Chronic anticoagulation 10/21/2013   Pulmonary embolus (HCC) 10/15/2013   Late effect of superficial injury 09/11/2013   Pruritus 09/11/2013   Acute pulmonary embolism (HCC) 09/04/2013   Deep venous thrombosis (HCC) 09/04/2013   Thrombocytopenic disorder (HCC) 09/04/2013   Syncope and collapse 09/04/2013   Grief 08/18/2013   Right arm pain 05/30/2013   Angioedema 03/14/2013    Obesity 05/13/2011   KELOID 06/21/2009   SKIN LESION 06/21/2009   Headache(784.0) 06/21/2009   B12 deficiency 03/18/2009   Hyperlipidemia 03/17/2009   Anemia 03/17/2009   Asthma 03/17/2009   ECZEMA 03/17/2009   DISC DISEASE, LUMBAR 03/17/2009   FATIGUE 03/17/2009   Cervical high risk HPV (human papillomavirus) test positive 01/03/1999   Chlamydial infection 01/02/1989   Past Medical History:  Diagnosis Date   ALLERGIC RHINITIS 03/17/2009   Anemia    ASTHMA 03/17/2009   DVT (deep venous thrombosis) (HCC)    ECZEMA 03/17/2009   Headache(784.0) 06/21/2009   recurrent   HYPERLIPIDEMIA 03/17/2009   KELOID 06/21/2009   Pulmonary emboli (HCC)    VITAMIN B12 DEFICIENCY 03/18/2009    Family History  Problem Relation Age of Onset   Hypertension Mother    Diabetes Mother    Cancer Father        Prostate Cancer   Hypertension Father    Eczema Sister    Allergies Sister    Angioedema Sister    Deep vein thrombosis Sister    Cancer Paternal Aunt        Ovarian Cancer   Cancer Other        Grandparent-Lung Cancer   Diabetes Other        Grandparent   Heart disease Other        Grandparent   Alcohol abuse Other        Several on both sides of family-3 uncles    Past Surgical History:  Procedure Laterality Date   KELOID EXCISION  mid 1990's   s/p keloid removal-laser    Social History   Occupational History   Occupation: Hair stylist  Tobacco Use   Smoking status: Former    Current packs/day: 0.00    Average packs/day: 0.3 packs/day for 4.1 years (1.0 ttl pk-yrs)    Types: Cigarettes    Start date: 03/17/2004    Quit date: 04/17/2008    Years since quitting: 14.3    Passive exposure: Never   Smokeless tobacco: Never   Tobacco comments:    quit 5 years ago  Vaping Use   Vaping status: Never Used  Substance and Sexual Activity   Alcohol use: Yes    Alcohol/week: 0.0 standard drinks of alcohol    Comment: socially   Drug use: No   Sexual activity: Not on file

## 2022-08-11 ENCOUNTER — Other Ambulatory Visit: Payer: Self-pay | Admitting: Radiology

## 2022-08-11 ENCOUNTER — Encounter: Payer: Self-pay | Admitting: Radiology

## 2022-08-11 MED ORDER — TIRZEPATIDE 2.5 MG/0.5ML ~~LOC~~ SOAJ: 2.5000 mg | SUBCUTANEOUS | 11 refills | Status: DC

## 2022-08-27 NOTE — Progress Notes (Unsigned)
Follow Up Note  RE: Hannah Thompson MRN: [id number] DOB: 21-May-1970 Date of Office Visit: 08/28/2022  Referring provider: Corwin Levins, MD Primary care provider: Corwin Levins, MD  Chief Complaint: No chief complaint on file.  History of Present Illness: I had the pleasure of seeing Hannah Thompson for a [facility]  on 08/27/2022. She is a 52 y.o. female, who is being followed for allergic rhinoconjunctivitis, food allergy, atopic dermatitis and multiple drug allergies. Her previous allergy office visit was on 02/27/2022 with Dr. Selena Thompson. Today is a regular follow up visit.  Seasonal and perennial allergic rhinoconjunctivitis Past history - Perennial rhinoconjunctivitis symptoms for 40+ years with worsening from spring through fall.  Zyrtec caused a rash and swelling.  Skin testing over 20 years ago showed multiple positives per patient report and was on allergy immunotherapy for a few years with good benefit. 2022 skin testing showed: Positive to grass, weed pollen, ragweed, trees, mold, dust mites, cat, feathers, cockroach. Interim history - not interested in AIT. Stable with Singulair and Xyzal. Continue environmental control measures. Continue Xyzal (levocetirizine) 5mg  daily. May take twice a day during allergy flares.  Continue Singulair (montelukast) 10mg  daily at night. Use Flonase (fluticasone) OR Nasonex nasal spray 1 spray per nostril twice a day as needed for nasal congestion.  Nasal saline spray (i.e., Simply Saline) or nasal saline lavage (i.e., NeilMed) is recommended as needed and prior to medicated nasal sprays. Use cromolyn 4% 1 drop in each eye up to four times a day as needed for itchy/watery eyes.  Consider allergy injections for long term control if above medications do not help the symptoms.   Food allergy Past history - Fresh tomatoes cause facial pruritus/? Tongue swelling. Tolerates processed tomatoes. Coconut causes contact rash  and perioral pruritus. Melons/oranges cause perioral pruritus. Avocados caused vomiting/pruritus. Pollock caused nausea, pruritus and eye swelling. Eats other seafood with no issues. 2022 skin testing showed: Borderline positive to soy, hazelnut, pistachio, orange, cantaloupe and watermelon. Interim history - no reactions.  Continue to avoid foods that are bothersome - melons, oranges, coconut, fresh tomatoes, avocado, pollock. For mild symptoms you can take over the counter antihistamines such as Benadryl and monitor symptoms closely. If symptoms worsen or if you have severe symptoms including breathing issues, throat closure, significant swelling, whole body hives, severe diarrhea and vomiting, lightheadedness then inject epinephrine and seek immediate medical care afterwards. Action plan in place.    Other atopic dermatitis Broke out on her neck/lower face.  Continue proper skin care. Use desonide 0.05% ointment twice a day as needed for mild rash flares - okay to use on the face, neck, groin area. Do not use more than 1 week at a time. Use triamcinolone 0.1% ointment twice a day as needed for rash flares. Do not use on the face, neck, armpits or groin area. Do not use more than 3 weeks in a row.   Multiple drug allergies Continue to avoid drugs on allergy list.    Return in about 6 months (around 08/28/2022).  Assessment and Plan: Hannah Thompson is a 52 y.o. female with: Seasonal allergic rhinitis due to pollen Allergic rhinitis due to animal dander Allergic rhinitis due to dust mite Allergic rhinitis due to mold Allergy to cockroaches Allergic conjunctivitis of both eyes Past history - Zyrtec caused rash and swelling.  Skin testing over 20 years ago showed multiple positives per patient report and was on AIT for a few  years with good benefit. 2022 skin testing showed: Positive to grass, weed pollen, ragweed, trees, mold, dust mites, cat, feathers, cockroach.  Food allergy Past history - Fresh  tomatoes cause facial pruritus/? Tongue swelling. Tolerates processed tomatoes. Coconut causes contact rash and perioral pruritus. Melons/oranges cause perioral pruritus. Avocados caused vomiting/pruritus. Pollock caused nausea, pruritus and eye swelling. Eats other seafood with no issues. 2022 skin testing showed: Borderline positive to soy, hazelnut, pistachio, orange, cantaloupe and watermelon.  Aopic dermatitis ***  Multiple drug allergies ***   No follow-ups on file.  No orders of the defined types were placed in this encounter.  Lab Orders  No laboratory test(s) ordered today    Diagnostics: Spirometry:  Tracings reviewed. Her effort: {Blank single:19197::"Good reproducible efforts.","It was hard to get consistent efforts and there is a question as to whether this reflects a maximal maneuver.","Poor effort, data can not be interpreted."} FVC: ***L FEV1: ***L, ***% predicted FEV1/FVC ratio: ***% Interpretation: {Blank single:19197::"Spirometry consistent with mild obstructive disease","Spirometry consistent with moderate obstructive disease","Spirometry consistent with severe obstructive disease","Spirometry consistent with possible restrictive disease","Spirometry consistent with mixed obstructive and restrictive disease","Spirometry uninterpretable due to technique","Spirometry consistent with normal pattern","No overt abnormalities noted given today's efforts"}.  Please see scanned spirometry results for details.  Skin Testing: {Blank single:19197::"Select foods","Environmental allergy panel","Environmental allergy panel and select foods","Food allergy panel","None","Deferred due to recent antihistamines use"}. *** Results discussed with patient/family.   Medication List:  Current Outpatient Medications  Medication Sig Dispense Refill  . cromolyn (OPTICROM) 4 % ophthalmic solution Place 1 drop into both eyes 4 (four) times daily as needed (itchy/watery eyes). 10 mL 1  .  dabigatran (PRADAXA) 150 MG CAPS capsule TAKE 1 CAPSULE BY MOUTH EVERY 12 HOURS 60 capsule 6  . desonide (DESOWEN) 0.05 % ointment Apply 1 Application topically 2 (two) times daily as needed (mild rash flare). Okay to use on the face, neck, groin area. Do not use more than 1 week at a time. 60 g 2  . EPINEPHrine 0.3 mg/0.3 mL IJ SOAJ injection Inject 0.3 mg into the muscle as needed for anaphylaxis. 2 each 1  . fluticasone (FLONASE) 50 MCG/ACT nasal spray Place 1 spray into both nostrils daily. 16 g 5  . levocetirizine (XYZAL) 5 MG tablet Take 1 tablet (5 mg total) by mouth 2 (two) times daily as needed for allergies (Can take an extra dose during flare ups.). 60 tablet 5  . montelukast (SINGULAIR) 10 MG tablet Take 1 tablet (10 mg total) by mouth at bedtime as needed (allergies). 30 tablet 5  . tirzepatide (MOUNJARO) 2.5 MG/0.5ML Pen Inject 2.5 mg into the skin once a week. 2 mL 11  . triamcinolone ointment (KENALOG) 0.1 % Apply 1 application topically 2 (two) times daily as needed (rash flare). Do not use on the face, neck, armpits or groin area. Do not use more than 3 weeks in a row. 30 g 1   No current facility-administered medications for this visit.   Allergies: Allergies  Allergen Reactions  . Avocado Nausea And Vomiting    Projectile N/V  . Fish Allergy Anaphylaxis and Swelling    Pollock.Swelling of lips, throat and face.  . Oxaprozin     angioedema  . Sulfonamide Derivatives     REACTION: hives  . Latex Rash  . Orange Juice [Orange Oil] Hives    Orange juice  . Tomato Hives    tomatoes  . Zyrtec [Cetirizine] Hives and Rash   I reviewed her past medical history, social  history, family history, and environmental history and no significant changes have been reported from her previous visit.  Review of Systems  Constitutional:  Negative for appetite change, chills, fever and unexpected weight change.  HENT:  Positive for congestion. Negative for rhinorrhea.   Eyes:  Negative  for itching.  Respiratory:  Negative for cough, chest tightness, shortness of breath and wheezing.   Cardiovascular:  Negative for chest pain.  Gastrointestinal:  Negative for abdominal pain.  Genitourinary:  Negative for difficulty urinating.  Skin:  Positive for rash.  Allergic/Immunologic: Positive for environmental allergies and food allergies.  Neurological:  Negative for headaches.   Objective: LMP  (LMP Unknown)  There is no height or weight on file to calculate BMI. Physical Exam Vitals and nursing note reviewed.  Constitutional:      Appearance: Normal appearance. She is well-developed.  HENT:     Head: Normocephalic and atraumatic.     Right Ear: Tympanic membrane and external ear normal.     Left Ear: Tympanic membrane and external ear normal.     Nose: Nose normal.     Mouth/Throat:     Mouth: Mucous membranes are moist.     Pharynx: Oropharynx is clear.  Eyes:     Conjunctiva/sclera: Conjunctivae normal.  Cardiovascular:     Rate and Rhythm: Normal rate and regular rhythm.     Heart sounds: Normal heart sounds. No murmur heard.    No friction rub. No gallop.  Pulmonary:     Effort: Pulmonary effort is normal.     Breath sounds: Normal breath sounds. No wheezing, rhonchi or rales.  Abdominal:     Palpations: Abdomen is soft.  Musculoskeletal:     Cervical back: Neck supple.  Skin:    General: Skin is warm.     Findings: Rash present.     Comments: Faint erythematous hue on left jaw/chin area.   Neurological:     Mental Status: She is alert and oriented to person, place, and time.  Previous notes and tests were reviewed. The plan was reviewed with the patient/family, and all questions/concerned were addressed.  It was my pleasure to see Merion today and participate in her care. Please feel free to contact me with any questions or concerns.  Sincerely,  Wyline Mood, DO Allergy & Immunology  [facility] office:  [phone] [facility] office: [phone]

## 2022-08-28 ENCOUNTER — Other Ambulatory Visit: Payer: Self-pay

## 2022-08-28 ENCOUNTER — Ambulatory Visit (INDEPENDENT_AMBULATORY_CARE_PROVIDER_SITE_OTHER): Payer: Medicaid Other | Admitting: Allergy

## 2022-08-28 ENCOUNTER — Encounter: Payer: Self-pay | Admitting: Allergy

## 2022-08-28 VITALS — BP 130/88 | HR 60 | Temp 98.4°F | Resp 14 | Ht 67.0 in | Wt 268.5 lb

## 2022-08-28 DIAGNOSIS — Z889 Allergy status to unspecified drugs, medicaments and biological substances status: Secondary | ICD-10-CM

## 2022-08-28 DIAGNOSIS — J3081 Allergic rhinitis due to animal (cat) (dog) hair and dander: Secondary | ICD-10-CM | POA: Diagnosis not present

## 2022-08-28 DIAGNOSIS — J3089 Other allergic rhinitis: Secondary | ICD-10-CM | POA: Diagnosis not present

## 2022-08-28 DIAGNOSIS — H1013 Acute atopic conjunctivitis, bilateral: Secondary | ICD-10-CM

## 2022-08-28 DIAGNOSIS — L2089 Other atopic dermatitis: Secondary | ICD-10-CM | POA: Diagnosis not present

## 2022-08-28 DIAGNOSIS — T781XXD Other adverse food reactions, not elsewhere classified, subsequent encounter: Secondary | ICD-10-CM | POA: Diagnosis not present

## 2022-08-28 DIAGNOSIS — J301 Allergic rhinitis due to pollen: Secondary | ICD-10-CM | POA: Diagnosis not present

## 2022-08-28 DIAGNOSIS — T7819XD Other adverse food reactions, not elsewhere classified, subsequent encounter: Secondary | ICD-10-CM

## 2022-08-28 DIAGNOSIS — Z91038 Other insect allergy status: Secondary | ICD-10-CM

## 2022-08-28 MED ORDER — TRIAMCINOLONE ACETONIDE 0.1 % EX CREA: 1.0000 | TOPICAL_CREAM | Freq: Two times a day (BID) | CUTANEOUS | 3 refills | Status: DC | PRN

## 2022-08-28 MED ORDER — DESONIDE 0.05 % EX CREA: TOPICAL_CREAM | Freq: Two times a day (BID) | CUTANEOUS | 3 refills | Status: DC | PRN

## 2022-08-28 MED ORDER — CROMOLYN SODIUM 4 % OP SOLN: 1.0000 [drp] | Freq: Four times a day (QID) | OPHTHALMIC | 3 refills | Status: DC | PRN

## 2022-08-28 MED ORDER — LEVOCETIRIZINE DIHYDROCHLORIDE 5 MG PO TABS: 5.0000 mg | ORAL_TABLET | Freq: Two times a day (BID) | ORAL | 3 refills | Status: DC | PRN

## 2022-08-28 MED ORDER — FLUTICASONE PROPIONATE 50 MCG/ACT NA SUSP: 1.0000 | Freq: Two times a day (BID) | NASAL | 5 refills | Status: DC | PRN

## 2022-08-28 MED ORDER — MONTELUKAST SODIUM 10 MG PO TABS: 10.0000 mg | ORAL_TABLET | Freq: Every evening | ORAL | 3 refills | Status: DC | PRN

## 2022-08-28 NOTE — Patient Instructions (Addendum)
Environmental allergies 2022 skin testing showed: Positive to grass, weed pollen, ragweed, trees, mold, dust mites, cat, feathers, cockroach. Continue environmental control measures. Continue Xyzal (levocetirizine) 5mg  daily. May take twice a day during allergy flares.  Continue Singulair (montelukast) 10mg  daily at night. Use Flonase (fluticasone) nasal spray 1 spray per nostril twice a day as needed for nasal congestion.  Nasal saline spray (i.e., Simply Saline) or nasal saline lavage (i.e., NeilMed) is recommended as needed and prior to medicated nasal sprays. Use cromolyn 4% 1 drop in each eye up to four times a day as needed for itchy/watery eyes.  Consider allergy injections for long term control if above medications do not help the symptoms.  Food Continue to avoid foods that are bothersome - melons, oranges, coconut, fresh tomatoes, avocado, pollock. For mild symptoms you can take over the counter antihistamines such as Benadryl and monitor symptoms closely. If symptoms worsen or if you have severe symptoms including breathing issues, throat closure, significant swelling, whole body hives, severe diarrhea and vomiting, lightheadedness then inject epinephrine and seek immediate medical care afterwards. Action plan in place.   Drugs Continue to avoid drugs on your allergy list.   Skin Continue proper skin care. Use desonide 0.05% cream twice a day as needed for mild rash flares - okay to use on the face, neck, groin area. Do not use more than 1 week at a time. Use triamcinolone 0.1% cream twice a day as needed for rash flares. Do not use on the face, neck, armpits or groin area. Do not use more than 3 weeks in a row.   Follow up in 6 months or sooner if needed.   Skin care recommendations  Bath time: Always use lukewarm water. AVOID very hot or cold water. Keep bathing time to 5-10 minutes. Do NOT use bubble bath. Use a mild soap and use just enough to wash the dirty areas. Do  NOT scrub skin vigorously.  After bathing, pat dry your skin with a towel. Do NOT rub or scrub the skin.  Moisturizers and prescriptions:  ALWAYS apply moisturizers immediately after bathing (within 3 minutes). This helps to lock-in moisture. Use the moisturizer several times a day over the whole body. Good summer moisturizers include: Aveeno, CeraVe, Cetaphil. Good winter moisturizers include: Aquaphor, Vaseline, Cerave, Cetaphil, Eucerin, Vanicream. When using moisturizers along with medications, the moisturizer should be applied about one hour after applying the medication to prevent diluting effect of the medication or moisturize around where you applied the medications. When not using medications, the moisturizer can be continued twice daily as maintenance.  Laundry and clothing: Avoid laundry products with added color or perfumes. Use unscented hypo-allergenic laundry products such as Tide free, Cheer free & gentle, and All free and clear.  If the skin still seems dry or sensitive, you can try double-rinsing the clothes. Avoid tight or scratchy clothing such as wool. Do not use fabric softeners or dyer sheets.

## 2022-08-29 ENCOUNTER — Telehealth: Payer: Self-pay | Admitting: Internal Medicine

## 2022-08-29 MED ORDER — OZEMPIC (0.25 OR 0.5 MG/DOSE) 2 MG/3ML ~~LOC~~ SOPN: PEN_INJECTOR | SUBCUTANEOUS | 11 refills | Status: DC

## 2022-08-29 NOTE — Telephone Encounter (Signed)
Ok I have sent the ozempic to the pharmacy  But I cannot say if covered by insurance, as we are not allowed to know the coverage of medications in our system for any particular insurance  This is only allowed at the pharmacy for some reason

## 2022-08-29 NOTE — Telephone Encounter (Signed)
Pt called back in reference to the medication Mounjaro and if its any alternatives that an be prescribed instead and that is cover by her insurance. Please advise.

## 2022-09-03 DIAGNOSIS — Z419 Encounter for procedure for purposes other than remedying health state, unspecified: Secondary | ICD-10-CM | POA: Diagnosis not present

## 2022-09-05 NOTE — Telephone Encounter (Signed)
Patient wants this sent through again for PA - she states she only has one insurance now - [employer]

## 2022-09-08 ENCOUNTER — Telehealth: Payer: Self-pay

## 2022-09-08 NOTE — Telephone Encounter (Signed)
Pharmacy Patient Advocate Encounter   Received notification from Patient Advice Request messages that prior authorization for Ozempic (0.25 or 0.5 MG/DOSE) 2MG /3ML pen-injectors is required/requested.   Insurance verification completed.   The patient is insured through [employer] [place] .   Per test claim: PA required; PA submitted to [facility] [place] [employer] via CoverMyMeds Key/confirmation #/EOC [id number] Status is pending

## 2022-09-10 ENCOUNTER — Encounter (HOSPITAL_COMMUNITY): Payer: Self-pay | Admitting: Emergency Medicine

## 2022-09-10 ENCOUNTER — Ambulatory Visit (HOSPITAL_COMMUNITY)
Admission: EM | Admit: 2022-09-10 | Discharge: 2022-09-10 | Disposition: A | Discharge status: Home / Self Care | Payer: Medicaid Other | Attending: Emergency Medicine | Admitting: Emergency Medicine

## 2022-09-10 ENCOUNTER — Other Ambulatory Visit: Payer: Self-pay

## 2022-09-10 DIAGNOSIS — M545 Low back pain, unspecified: Secondary | ICD-10-CM

## 2022-09-10 MED ORDER — METHYLPREDNISOLONE ACETATE 40 MG/ML IJ SUSP
INTRAMUSCULAR | Status: AC
  Filled 2022-09-10: qty 1

## 2022-09-10 MED ORDER — CYCLOBENZAPRINE HCL 10 MG PO TABS: 10.0000 mg | ORAL_TABLET | Freq: Two times a day (BID) | ORAL | 0 refills | Status: DC | PRN

## 2022-09-10 MED ORDER — LIDOCAINE 5 % EX PTCH: 1.0000 | MEDICATED_PATCH | CUTANEOUS | 0 refills | Status: DC

## 2022-09-10 MED ORDER — METHYLPREDNISOLONE ACETATE 40 MG/ML IJ SUSP
20.0000 mg | Freq: Once | INTRAMUSCULAR | Status: AC
  Administered 2022-09-10: 20 mg via INTRAMUSCULAR

## 2022-09-10 NOTE — ED Triage Notes (Signed)
Pt c/o mid and lower back pain since yesterday, denies any fall or injury.
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
## 2022-09-10 NOTE — Discharge Instructions (Addendum)
You can take the muscle relaxer Flexeril twice daily. If the medication makes you drowsy, take only at bed time.  Tylenol can be used every 4-6 hours  Lidocaine patch can be applied for 12 hours at a time  Avoid heavy lifting and strenuous activity  It may take several days to a week for symptoms to improve.  Please follow up with your primary care provider

## 2022-09-11 NOTE — Telephone Encounter (Signed)
Pharmacy Patient Advocate Encounter  Received notification from [facility] Medicaid that Prior Authorization for Ozempic (0.25 or 0.5 MG/DOSE) 2MG /3ML pen-injectors has been DENIED.  Full denial letter will be uploaded to the media tab. See denial reason below.   PA #/Case ID/Reference #: [id number]   Denial Reason:

## 2022-09-12 ENCOUNTER — Encounter: Payer: Self-pay | Admitting: Internal Medicine

## 2022-09-12 ENCOUNTER — Ambulatory Visit (INDEPENDENT_AMBULATORY_CARE_PROVIDER_SITE_OTHER): Payer: Medicaid Other | Admitting: Internal Medicine

## 2022-09-12 VITALS — BP 106/80 | HR 55 | Temp 98.0°F | Ht 67.0 in | Wt 265.8 lb

## 2022-09-12 DIAGNOSIS — S39012D Strain of muscle, fascia and tendon of lower back, subsequent encounter: Secondary | ICD-10-CM | POA: Diagnosis not present

## 2022-09-12 LAB — POC URINALSYSI DIPSTICK (AUTOMATED)
Bilirubin, UA: NEGATIVE
Blood, UA: NEGATIVE
Glucose, UA: NEGATIVE
Ketones, UA: NEGATIVE
Nitrite, UA: NEGATIVE
Protein, UA: NEGATIVE
Spec Grav, UA: 1.015 (ref 1.010–1.025)
Urobilinogen, UA: 0.2 U/dL
pH, UA: 6 (ref 5.0–8.0)

## 2022-09-12 MED ORDER — IBUPROFEN 800 MG PO TABS
800.0000 mg | ORAL_TABLET | Freq: Three times a day (TID) | ORAL | 0 refills | Status: AC | PRN
Start: 2022-09-12 — End: ?

## 2022-09-12 MED ORDER — METHOCARBAMOL 500 MG PO TABS
500.0000 mg | ORAL_TABLET | Freq: Three times a day (TID) | ORAL | 0 refills | Status: AC | PRN
Start: 2022-09-12 — End: ?
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
## 2022-09-12 NOTE — Patient Instructions (Addendum)
Rest  No heavy lifting  Stop flexeril.  Use heating pad to lower back    Shingles vaccine - [facility]  [facility]  [facility]  [facility] on [address]  [facility] [place]

## 2022-09-13 ENCOUNTER — Telehealth: Payer: Self-pay

## 2022-09-13 ENCOUNTER — Other Ambulatory Visit (HOSPITAL_COMMUNITY): Payer: Self-pay

## 2022-09-13 NOTE — Telephone Encounter (Signed)
Pt has called and stated her pharmacy has informed her she needs a PA for her Ozempic

## 2022-10-03 DIAGNOSIS — Z419 Encounter for procedure for purposes other than remedying health state, unspecified: Secondary | ICD-10-CM | POA: Diagnosis not present

## 2022-10-04 NOTE — Telephone Encounter (Signed)
Sorry, it appears the ozempic and mounjaro were not approved, and there is no other alternative

## 2022-10-04 NOTE — Telephone Encounter (Signed)
Patient wants to know if something else will be sent in sent ozempic was denied.  Please call patient at:  [phone]

## 2022-11-03 DIAGNOSIS — Z419 Encounter for procedure for purposes other than remedying health state, unspecified: Secondary | ICD-10-CM | POA: Diagnosis not present

## 2022-11-22 NOTE — Telephone Encounter (Signed)
Pt called today stating about the PA for ozempic inform pt it was denied back in September but now she is saying something about the pharmacy is asking to send another one. I asked pt what exactly the pharmacy said they she stated just to another PA needs to be sent. I not sure what is exactly going on please reach out to pt. Thanks

## 2022-12-03 DIAGNOSIS — Z419 Encounter for procedure for purposes other than remedying health state, unspecified: Secondary | ICD-10-CM | POA: Diagnosis not present

## 2022-12-20 ENCOUNTER — Other Ambulatory Visit: Payer: Self-pay

## 2022-12-20 ENCOUNTER — Emergency Department (HOSPITAL_BASED_OUTPATIENT_CLINIC_OR_DEPARTMENT_OTHER): Payer: Medicaid Other

## 2022-12-20 ENCOUNTER — Telehealth: Payer: Self-pay | Admitting: *Deleted

## 2022-12-20 ENCOUNTER — Emergency Department (HOSPITAL_BASED_OUTPATIENT_CLINIC_OR_DEPARTMENT_OTHER)
Admission: EM | Admit: 2022-12-20 | Discharge: 2022-12-20 | Disposition: A | Discharge status: Home / Self Care | Payer: Medicaid Other | Attending: Emergency Medicine | Admitting: Emergency Medicine

## 2022-12-20 ENCOUNTER — Encounter (HOSPITAL_BASED_OUTPATIENT_CLINIC_OR_DEPARTMENT_OTHER): Payer: Self-pay

## 2022-12-20 DIAGNOSIS — W19XXXA Unspecified fall, initial encounter: Secondary | ICD-10-CM

## 2022-12-20 DIAGNOSIS — I10 Essential (primary) hypertension: Secondary | ICD-10-CM | POA: Diagnosis not present

## 2022-12-20 DIAGNOSIS — W01198A Fall on same level from slipping, tripping and stumbling with subsequent striking against other object, initial encounter: Secondary | ICD-10-CM | POA: Insufficient documentation

## 2022-12-20 DIAGNOSIS — M25551 Pain in right hip: Secondary | ICD-10-CM | POA: Diagnosis not present

## 2022-12-20 DIAGNOSIS — Z91040 Latex allergy status: Secondary | ICD-10-CM | POA: Insufficient documentation

## 2022-12-20 DIAGNOSIS — R5383 Other fatigue: Secondary | ICD-10-CM | POA: Diagnosis not present

## 2022-12-20 DIAGNOSIS — J45909 Unspecified asthma, uncomplicated: Secondary | ICD-10-CM | POA: Diagnosis not present

## 2022-12-20 DIAGNOSIS — S0990XA Unspecified injury of head, initial encounter: Secondary | ICD-10-CM | POA: Diagnosis not present

## 2022-12-20 NOTE — ED Notes (Signed)
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
## 2022-12-20 NOTE — Discharge Instructions (Signed)
You were seen today for a fall happening 2 days ago.  At this time there is no evidence for any bleeding or lasting trauma.  However if you begin to develop increasing headache, instability, blurry vision, weakness, return to the ER for further evaluation.  It was a pleasure seeing you in the ER.

## 2022-12-20 NOTE — ED Notes (Signed)
ED Provider at bedside. 

## 2022-12-20 NOTE — Telephone Encounter (Signed)
Patient called stating that she fell over the weekend and hit her head and she said she hasn't felt good at all since and she has a big knot on the side of her head.  Patient is taking her blood thinner.  Advised patient to go to the ED to be evaluated.  Patient appreciated advice.

## 2022-12-20 NOTE — ED Provider Notes (Signed)
[name] [facility] AT [facility] Provider Note   CSN: [id number] Arrival date & time: 12/20/22  1516     History  Chief Complaint  Patient presents with   [name]    Hannah Thompson is a 52 y.o. female.   Fall  Patient presents 2 days post fall hitting her head on the concrete on dabigatran.  Previous medical history of anemia, asthma, pulmonary embolus, hypertension.  She states that she took a corner too hard try to get up on a curb and fell.  Denies LOC.  States she is felt mildly fatigued yesterday but no other symptoms.  Denies headache, vision changes, weakness, numbness, tingling, neck pain, back pain, nausea, vomiting.  She states that her right hip hurts but this is due to chronic arthritis pains from baseline.   Home Medications Prior to Admission medications   Medication Sig Start Date End Date Taking? Authorizing Provider  cromolyn (OPTICROM) 4 % ophthalmic solution Place 1 drop into both eyes 4 (four) times daily as needed (itchy/watery eyes). 08/28/22   Ellamae Sia, DO  dabigatran (PRADAXA) 150 MG CAPS capsule TAKE 1 CAPSULE BY MOUTH EVERY 12 HOURS 07/14/22   Erenest Blank, NP  desonide (DESOWEN) 0.05 % cream Apply topically 2 (two) times daily as needed (mild rash flare). okay to use on the face, neck, groin area. Do not use more than 1 week at a time. 08/28/22   Ellamae Sia, DO  EPINEPHrine 0.3 mg/0.3 mL IJ SOAJ injection Inject 0.3 mg into the muscle as needed for anaphylaxis. 02/27/22   Ellamae Sia, DO  fluticasone (FLONASE) 50 MCG/ACT nasal spray Place 1 spray into both nostrils 2 (two) times daily as needed (nasal congestion). 08/28/22   Ellamae Sia, DO  ibuprofen (ADVIL) 800 MG tablet Take 1 tablet (800 mg total) by mouth every 8 (eight) hours as needed (pain). Take with food. 09/12/22   Salvatore Decent, FNP  levocetirizine (XYZAL) 5 MG tablet Take 1 tablet (5 mg total) by mouth 2 (two) times daily as needed for allergies (Can take an extra dose  during flare ups.). 08/28/22   Ellamae Sia, DO  lidocaine (LIDODERM) 5 % Place 1 patch onto the skin daily. Remove & Discard patch within 12 hours 09/10/22   Rising, Lurena Joiner, PA-C  methocarbamol (ROBAXIN) 500 MG tablet Take 1 tablet (500 mg total) by mouth every 8 (eight) hours as needed for muscle spasms. 09/12/22   Salvatore Decent, FNP  montelukast (SINGULAIR) 10 MG tablet Take 1 tablet (10 mg total) by mouth at bedtime as needed (allergies). 08/28/22   Ellamae Sia, DO  Semaglutide,0.25 or 0.5MG /DOS, (OZEMPIC, 0.25 OR 0.5 MG/DOSE,) 2 MG/3ML SOPN Take 0.25 mg subcutaneous once weekly 08/29/22   Corwin Levins, MD  tirzepatide [facility]) 2.5 MG/0.5ML Pen Inject 2.5 mg into the skin once a week. 08/11/22   Corwin Levins, MD  triamcinolone cream (KENALOG) 0.1 % Apply 1 Application topically 2 (two) times daily as needed (moderate eczema). Do not use on the face, neck, armpits or groin area. Do not use more than 3 weeks in a row. 08/28/22   Ellamae Sia, DO      Allergies    Avocado, Fish allergy, Oxaprozin, Sulfonamide derivatives, Latex, Orange juice [orange oil], Tomato, and Zyrtec [cetirizine]    Review of Systems   Review of Systems  Constitutional:  Positive for fatigue.  All other systems reviewed and are negative.   Physical Exam  Updated Vital Signs BP 131/89 (BP Location: Right Arm)   Pulse 75   Temp 98.3 F (36.8 C) (Oral)   Resp 17   Ht 5\' 7"  (1.702 m)   Wt 120.2 kg   LMP  (LMP Unknown)   SpO2 99%   BMI 41.50 kg/m  Physical Exam Vitals and nursing note reviewed.  Constitutional:      General: She is not in acute distress.    Appearance: Normal appearance.  HENT:     Head: Normocephalic and atraumatic.     Comments: Patient states that right temple is mildly tender to palpation.  No swelling, contusion, hematoma present    Right Ear: Tympanic membrane, ear canal and external ear normal. There is no impacted cerumen.     Left Ear: Tympanic membrane, ear canal and external ear normal.  There is no impacted cerumen.     Nose: Nose normal. No congestion.     Mouth/Throat:     Mouth: Mucous membranes are moist.     Pharynx: Oropharynx is clear. No oropharyngeal exudate.  Eyes:     General: No scleral icterus.       Right eye: No discharge.        Left eye: No discharge.     Extraocular Movements: Extraocular movements intact.     Conjunctiva/sclera: Conjunctivae normal.  Neck:     Vascular: No carotid bruit.  Cardiovascular:     Rate and Rhythm: Normal rate and regular rhythm.     Pulses: Normal pulses.     Heart sounds: Normal heart sounds. No murmur heard.    No friction rub. No gallop.  Pulmonary:     Effort: Pulmonary effort is normal. No respiratory distress.     Breath sounds: Normal breath sounds.  Abdominal:     General: Abdomen is flat. There is no distension.     Palpations: Abdomen is soft.     Tenderness: There is no abdominal tenderness. There is no right CVA tenderness, left CVA tenderness, guarding or rebound.  Musculoskeletal:        General: No swelling or deformity.     Cervical back: Normal range of motion and neck supple. No rigidity or tenderness.  Lymphadenopathy:     Cervical: No cervical adenopathy.  Skin:    General: Skin is warm and dry.  Neurological:     General: No focal deficit present.     Mental Status: She is alert. Mental status is at baseline.  Psychiatric:        Mood and Affect: Mood normal.     ED Results / Procedures / Treatments   Labs (all labs ordered are listed, but only abnormal results are displayed) Labs Reviewed - No data to display  EKG None  Radiology CT Head Wo Contrast Result Date: 12/20/2022 CLINICAL DATA:  Fall, head trauma, coagulopathy EXAM: CT HEAD WITHOUT CONTRAST TECHNIQUE: Contiguous axial images were obtained from the base of the skull through the vertex without intravenous contrast. RADIATION DOSE REDUCTION: This exam was performed according to the departmental dose-optimization program  which includes automated exposure control, adjustment of the mA and/or kV according to patient size and/or use of iterative reconstruction technique. COMPARISON:  09/14/2013 FINDINGS: Brain: No evidence of acute infarction, hemorrhage, mass, mass effect, or midline shift. No hydrocephalus or extra-axial fluid collection. Partial empty sella. Normal craniocervical junction. Vascular: No hyperdense vessel. Skull: Negative for fracture or focal lesion. Sinuses/Orbits: No acute finding. Other: The mastoid air cells are well aerated. IMPRESSION:  No acute intracranial process. Electronically Signed   By: Wiliam Ke M.D.   On: 12/20/2022 16:49    Procedures Procedures    Medications Ordered in ED Medications - No data to display  ED Course/ Medical Decision Making/ A&P                                 Medical Decision Making Amount and/or Complexity of Data Reviewed Radiology: ordered.   This patient is a 52 year old female who presents to the ED for concern of fatigue post fall x 2 days on blood thinners.   Differential diagnoses prior to evaluation: The emergent differential diagnosis includes, but is not limited to, intracranial hemorrhage, fracture, cervical injury, hip fracture. This is not an exhaustive differential.   Past Medical History / Co-morbidities / Social History: Previous history of PE, on blood thinners, asthma, anemia, hypertension.  Additional history: Chart reviewed. Pertinent results include: Previously had a lumbar strain on 09/12/2022.  Lab Tests/Imaging studies: I personally interpreted labs/imaging and the pertinent results include:    .  CT head shows no acute intracranial process, fracture.  I agree with the radiologist interpretation.    Medications: No medications are needed for this visit at this time..  I have reviewed the patients home medicines and have made adjustments as needed.   ED Course:  Patient is 52 year old female who presents to the ED  2 days post fall hitting her head on the concrete while on blood thinners.  Denies LOC.  Has been able to ambulate since.  Denies any symptoms at this moment.  Physical exam was benign expressing mild tenderness over right temporal region and soreness on right hip.  However states that her right hip has been chronically sore and unchanged from arthritis pain.  CT of head was done which showed no intracranial bleed.  Patient has since been feeling well and came here simply because she was told to by her doctor due to being on anticoagulants.  Alerted patient to symptoms that would be concerning for head bleed, need for returning to the ER.  Patient's vitals are stable.  She is expressing no headache or concerns for intracranial bleed.  She has no other complaints at this time.  Patient appears stable to be discharged.  Patient expressed understanding and agreement with current plan.   Disposition: After consideration of the diagnostic results and the patients response to treatment, I feel that patient benefit from discharge and treatment noted as above.   emergency department workup does not suggest an emergent condition requiring admission or immediate intervention beyond what has been performed at this time. The plan is: Continued observation at home for symptoms, fall prevention, return to ER for new or worsening symptoms. The patient is safe for discharge and has been instructed to return immediately for worsening symptoms, change in symptoms or any other concerns.  Final Clinical Impression(s) / ED Diagnoses Final diagnoses:  Fall, initial encounter    Rx / DC Orders ED Discharge Orders     None         Lavonia Drafts 12/20/22 1715    Lonell Grandchild, MD 12/21/22 [phone]

## 2022-12-21 ENCOUNTER — Encounter: Payer: Self-pay | Admitting: Internal Medicine

## 2022-12-21 ENCOUNTER — Other Ambulatory Visit (HOSPITAL_COMMUNITY): Payer: Self-pay

## 2022-12-21 ENCOUNTER — Telehealth: Payer: Self-pay | Admitting: Pharmacist

## 2022-12-21 ENCOUNTER — Ambulatory Visit: Payer: Medicaid Other | Admitting: Internal Medicine

## 2022-12-21 VITALS — BP 120/82 | HR 50 | Temp 98.0°F | Ht 67.0 in | Wt 263.0 lb

## 2022-12-21 DIAGNOSIS — E559 Vitamin D deficiency, unspecified: Secondary | ICD-10-CM | POA: Diagnosis not present

## 2022-12-21 DIAGNOSIS — Z1231 Encounter for screening mammogram for malignant neoplasm of breast: Secondary | ICD-10-CM

## 2022-12-21 DIAGNOSIS — I10 Essential (primary) hypertension: Secondary | ICD-10-CM | POA: Diagnosis not present

## 2022-12-21 DIAGNOSIS — Z01419 Encounter for gynecological examination (general) (routine) without abnormal findings: Secondary | ICD-10-CM

## 2022-12-21 DIAGNOSIS — Z1211 Encounter for screening for malignant neoplasm of colon: Secondary | ICD-10-CM

## 2022-12-21 DIAGNOSIS — E538 Deficiency of other specified B group vitamins: Secondary | ICD-10-CM | POA: Diagnosis not present

## 2022-12-21 DIAGNOSIS — Z7985 Long-term (current) use of injectable non-insulin antidiabetic drugs: Secondary | ICD-10-CM

## 2022-12-21 DIAGNOSIS — E7800 Pure hypercholesterolemia, unspecified: Secondary | ICD-10-CM | POA: Diagnosis not present

## 2022-12-21 DIAGNOSIS — E119 Type 2 diabetes mellitus without complications: Secondary | ICD-10-CM | POA: Diagnosis not present

## 2022-12-21 LAB — POCT GLYCOSYLATED HEMOGLOBIN (HGB A1C): Hemoglobin A1C: 6.2 % — AB (ref 4.0–5.6)

## 2022-12-21 MED ORDER — TIRZEPATIDE 2.5 MG/0.5ML ~~LOC~~ SOAJ: 2.5000 mg | SUBCUTANEOUS | 11 refills | Status: DC

## 2022-12-21 NOTE — Assessment & Plan Note (Signed)
Lab Results  Component Value Date   LDLCALC 104 (H) 06/22/2022   uncontrolled, pt for DM lower chol diet, declines statin for now

## 2022-12-21 NOTE — Patient Instructions (Addendum)
Please have the second shingles shot done at the pharmacy  You will be contacted regarding the referral for: GYN, mammogram, colonoscopy, and eye doctor  I sent the script again for the [facility]  Please continue all other medications as before, and refills have been done if requested.  Please have the pharmacy call with any other refills you may need.  Please continue your efforts at being more active, low cholesterol diet, and weight control.  You are otherwise up to date with prevention measures today.  Please keep your appointments with your specialists as you may have planned  Your A1c was done today  Please make an Appointment to return in 6 months, or sooner if needed

## 2022-12-21 NOTE — Assessment & Plan Note (Signed)
Lab Results  Component Value Date   HGBA1C 6.2 (A) 12/21/2022   With uncontrolled wt, for mounjaro 2.5 mg weekly

## 2022-12-21 NOTE — Progress Notes (Signed)
Patient ID: Hannah Thompson, female   DOB: 04-15-1970, 52 y.o.   MRN: [id number]        Chief Complaint: follow up HTN, HLD and DM, low b12 and Vit D       HPI:  Hannah Thompson is a 52 y.o. female here overall doing ok,  Pt denies chest pain, increased sob or doe, wheezing, orthopnea, PND, increased LE swelling, palpitations, dizziness or syncope.   Pt denies polydipsia, polyuria, or new focal neuro s/s.    Pt denies fever, wt loss, night sweats, loss of appetite, or other constitutional symptoms  due for eye exam, gyn, mammogram, and colonoscopy       Wt Readings from Last 3 Encounters:  12/21/22 263 lb (119.3 kg)  12/20/22 265 lb (120.2 kg)  09/12/22 265 lb 12.8 oz (120.6 kg)   BP Readings from Last 3 Encounters:  12/21/22 120/82  12/20/22 131/89  09/12/22 106/80         Past Medical History:  Diagnosis Date   ALLERGIC RHINITIS 03/17/2009   Anemia    ASTHMA 03/17/2009   DVT (deep venous thrombosis) (HCC)    ECZEMA 03/17/2009   Headache(784.0) 06/21/2009   recurrent   HYPERLIPIDEMIA 03/17/2009   KELOID 06/21/2009   Pulmonary emboli (HCC)    VITAMIN B12 DEFICIENCY 03/18/2009   Past Surgical History:  Procedure Laterality Date   KELOID EXCISION  mid 1990's   s/p keloid removal-laser     reports that she quit smoking about 14 years ago. Her smoking use included cigarettes. She started smoking about 18 years ago. She has a 1 pack-year smoking history. She has never been exposed to tobacco smoke. She has never used smokeless tobacco. She reports current alcohol use. She reports that she does not use drugs. family history includes Alcohol abuse in an other family member; Allergies in her sister; Angioedema in her sister; Cancer in her father, paternal aunt, and another family member; Deep vein thrombosis in her sister; Diabetes in her mother and another family member; Eczema in her sister; Heart disease in an other family member; Hypertension in her father and mother. Allergies  Allergen  Reactions   Avocado Nausea And Vomiting    Projectile N/V   Fish Allergy Anaphylaxis and Swelling    Pollock.Swelling of lips, throat and face.   Oxaprozin     angioedema   Sulfonamide Derivatives     REACTION: hives   Latex Rash   Orange Juice [Orange Oil] Hives    Orange juice   Tomato Hives    tomatoes   Zyrtec [Cetirizine] Hives and Rash   Current Outpatient Medications on File Prior to Visit  Medication Sig Dispense Refill   cromolyn (OPTICROM) 4 % ophthalmic solution Place 1 drop into both eyes 4 (four) times daily as needed (itchy/watery eyes). 10 mL 3   dabigatran (PRADAXA) 150 MG CAPS capsule TAKE 1 CAPSULE BY MOUTH EVERY 12 HOURS 60 capsule 6   desonide (DESOWEN) 0.05 % cream Apply topically 2 (two) times daily as needed (mild rash flare). okay to use on the face, neck, groin area. Do not use more than 1 week at a time. 30 g 3   EPINEPHrine 0.3 mg/0.3 mL IJ SOAJ injection Inject 0.3 mg into the muscle as needed for anaphylaxis. 2 each 1   fluticasone (FLONASE) 50 MCG/ACT nasal spray Place 1 spray into both nostrils 2 (two) times daily as needed (nasal congestion). 16 g 5   ibuprofen (ADVIL) 800 MG tablet  Take 1 tablet (800 mg total) by mouth every 8 (eight) hours as needed (pain). Take with food. 30 tablet 0   levocetirizine (XYZAL) 5 MG tablet Take 1 tablet (5 mg total) by mouth 2 (two) times daily as needed for allergies (Can take an extra dose during flare ups.). 90 tablet 3   lidocaine (LIDODERM) 5 % Place 1 patch onto the skin daily. Remove & Discard patch within 12 hours 14 patch 0   methocarbamol (ROBAXIN) 500 MG tablet Take 1 tablet (500 mg total) by mouth every 8 (eight) hours as needed for muscle spasms. 30 tablet 0   montelukast (SINGULAIR) 10 MG tablet Take 1 tablet (10 mg total) by mouth at bedtime as needed (allergies). 90 tablet 3   triamcinolone cream (KENALOG) 0.1 % Apply 1 Application topically 2 (two) times daily as needed (moderate eczema). Do not use on the  face, neck, armpits or groin area. Do not use more than 3 weeks in a row. 45 g 3   No current facility-administered medications on file prior to visit.        ROS:  All others reviewed and negative.  Objective        PE:  BP 120/82 (BP Location: Right Arm, Patient Position: Sitting, Cuff Size: Normal)   Pulse (!) 50   Temp 98 F (36.7 C) (Oral)   Ht 5\' 7"  (1.702 m)   Wt 263 lb (119.3 kg)   LMP  (LMP Unknown)   SpO2 97%   BMI 41.19 kg/m                 Constitutional: Pt appears in NAD               HENT: Head: NCAT.                Right Ear: External ear normal.                 Left Ear: External ear normal.                Eyes: . Pupils are equal, round, and reactive to light. Conjunctivae and EOM are normal               Nose: without d/c or deformity               Neck: Neck supple. Gross normal ROM               Cardiovascular: Normal rate and regular rhythm.                 Pulmonary/Chest: Effort normal and breath sounds without rales or wheezing.                Abd:  Soft, NT, ND, + BS, no organomegaly               Neurological: Pt is alert. At baseline orientation, motor grossly intact               Skin: Skin is warm. No rashes, no other new lesions, LE edema - none               Psychiatric: Pt behavior is normal without agitation   Micro: none  Cardiac tracings I have personally interpreted today:  none  Pertinent Radiological findings (summarize): none   Lab Results  Component Value Date   WBC 5.8 07/14/2022   HGB 11.3 (L) 07/14/2022   HCT 35.0 (L) 07/14/2022  PLT 173 07/14/2022   GLUCOSE 107 (H) 07/14/2022   CHOL 178 06/22/2022   TRIG 173.0 (H) 06/22/2022   HDL 40.20 06/22/2022   LDLCALC 104 (H) 06/22/2022   ALT 13 07/14/2022   AST 16 07/14/2022   NA 141 07/14/2022   K 3.7 07/14/2022   CL 105 07/14/2022   CREATININE 0.79 07/14/2022   BUN 18 07/14/2022   CO2 29 07/14/2022   TSH 1.83 06/22/2022   INR 1.01 12/09/2017   HGBA1C 6.2 (A) 12/21/2022    MICROALBUR 1.6 06/22/2022   Assessment/Plan:  Hannah Thompson is a 52 y.o. Black or African American [2] female with  has a past medical history of ALLERGIC RHINITIS (03/17/2009), Anemia, ASTHMA (03/17/2009), DVT (deep venous thrombosis) (HCC), ECZEMA (03/17/2009), Headache(784.0) (06/21/2009), HYPERLIPIDEMIA (03/17/2009), KELOID (06/21/2009), Pulmonary emboli (HCC), and VITAMIN B12 DEFICIENCY (03/18/2009).  B12 deficiency Lab Results  Component Value Date   VITAMINB12 216 06/22/2022   Low, to start oral replacement - b12 1000 mcg qd   Hyperlipidemia Lab Results  Component Value Date   LDLCALC 104 (H) 06/22/2022   uncontrolled, pt for DM lower chol diet, declines statin for now  Hypertension BP Readings from Last 3 Encounters:  12/21/22 120/82  12/20/22 131/89  09/12/22 106/80   Stable, pt to continue medical treatment  - diet, wt control   Diabetes (HCC) Lab Results  Component Value Date   HGBA1C 6.2 (A) 12/21/2022   With uncontrolled wt, for mounjaro 2.5 mg weekly   Vitamin D deficiency Last vitamin D Lab Results  Component Value Date   VD25OH 9.00 (L) 06/22/2022   Low,, to start oral replacement  Followup: Return in about 6 months (around 06/21/2023).  Oliver Barre, MD 12/21/2022 9:26 PM  Medical Group [facility]

## 2022-12-21 NOTE — Telephone Encounter (Signed)
Sorry, pt has not tried other GLP1 so far.  thanks

## 2022-12-21 NOTE — Assessment & Plan Note (Signed)
Lab Results  Component Value Date   VITAMINB12 216 06/22/2022   Low, to start oral replacement - b12 1000 mcg qd

## 2022-12-21 NOTE — Telephone Encounter (Signed)
Pharmacy Patient Advocate Encounter  Insurance verification completed.    The patient is insured through [facility] [employer]   In order to complete the prior authorization process for [facility], the insurance is requesting documentation on what two GLP-1s (I.e. Victoza, Byetta, Trulicity, Ozempic) the patient has already tried.  Please advise!

## 2022-12-21 NOTE — Assessment & Plan Note (Signed)
BP Readings from Last 3 Encounters:  12/21/22 120/82  12/20/22 131/89  09/12/22 106/80   Stable, pt to continue medical treatment  - diet, wt control

## 2022-12-21 NOTE — Assessment & Plan Note (Signed)
Last vitamin D Lab Results  Component Value Date   VD25OH 9.00 (L) 06/22/2022   Low,, to start oral replacement

## 2022-12-28 ENCOUNTER — Other Ambulatory Visit (HOSPITAL_COMMUNITY): Payer: Self-pay

## 2023-01-03 DIAGNOSIS — Z419 Encounter for procedure for purposes other than remedying health state, unspecified: Secondary | ICD-10-CM | POA: Diagnosis not present

## 2023-01-11 ENCOUNTER — Other Ambulatory Visit (INDEPENDENT_AMBULATORY_CARE_PROVIDER_SITE_OTHER): Payer: Self-pay

## 2023-01-11 ENCOUNTER — Ambulatory Visit (INDEPENDENT_AMBULATORY_CARE_PROVIDER_SITE_OTHER): Payer: Medicaid Other | Admitting: Physician Assistant

## 2023-01-11 DIAGNOSIS — M1611 Unilateral primary osteoarthritis, right hip: Secondary | ICD-10-CM | POA: Diagnosis not present

## 2023-01-11 DIAGNOSIS — M7061 Trochanteric bursitis, right hip: Secondary | ICD-10-CM | POA: Diagnosis not present

## 2023-01-11 DIAGNOSIS — M25551 Pain in right hip: Secondary | ICD-10-CM | POA: Diagnosis not present

## 2023-01-11 MED ORDER — LIDOCAINE HCL 1 % IJ SOLN
3.0000 mL | INTRAMUSCULAR | Status: AC | PRN
  Administered 2023-01-11: 3 mL

## 2023-01-11 MED ORDER — METHYLPREDNISOLONE ACETATE 40 MG/ML IJ SUSP
40.0000 mg | INTRAMUSCULAR | Status: AC | PRN
  Administered 2023-01-11: 40 mg via INTRA_ARTICULAR

## 2023-01-11 MED ORDER — BUPIVACAINE HCL 0.25 % IJ SOLN
2.0000 mL | INTRAMUSCULAR | Status: AC | PRN
  Administered 2023-01-11: 2 mL via INTRA_ARTICULAR

## 2023-01-11 NOTE — Progress Notes (Signed)
 Office Visit Note   Patient: Hannah Thompson           Date of Birth: 03-Oct-1970           MRN: [id number] Visit Date: 01/11/2023              Requested by: Norleen Lynwood ORN, MD [address] [place],  [place] [address] PCP: Norleen Lynwood ORN, MD   Assessment & Plan: Visit Diagnoses:  1. Trochanteric bursitis, right hip   2. Unilateral primary osteoarthritis, right hip     Plan: Impression is right lateral hip pain.  Although the patient has advanced right hip OA, I feel her lateral hip pain is from trochanteric bursitis which she likely sustained from the fall onto her lateral hip.  We have discussed moving forward with trochanteric bursa injection today followed by an IT band exercise program.  If her symptoms do not improve over the next few weeks we may refer her back to Dr. Burnetta for ultrasound-guided cortisone injections right hip joint.  She is thinking about scheduling a right hip replacement this coming July.  Call with concerns or questions.  Follow-Up Instructions: Return if symptoms worsen or fail to improve.   Orders:  Orders Placed This Encounter  Procedures   Large Joint Inj: R greater trochanter   XR HIP UNILAT W OR W/O PELVIS 2-3 VIEWS RIGHT   No orders of the defined types were placed in this encounter.     Procedures: Large Joint Inj: R greater trochanter on 01/11/2023 9:53 AM Indications: pain Details: 22 G needle, lateral approach Medications: 3 mL lidocaine  1 %; 2 mL bupivacaine  0.25 %; 40 mg methylPREDNISolone  acetate 40 MG/ML      Clinical Data: No additional findings.   Subjective: Chief Complaint  Patient presents with   Right Hip - Pain, Follow-up    HPI patient is a pleasant 53 year old female who comes in today with right hip pain.  History of underlying osteoarthritis.  She was seen in our office back in August where her right hip joint was injected with cortisone.  She has had great relief here.  New symptoms began about a month ago after  falling on her right side.  She has had pain to the lateral hip since this fall.  Pain is intermittent but worse when she is standing a long time or lying on her right side.  She has tried Tylenol  arthritis with little relief.  Review of Systems as detailed in HPI.  All others are negative.   Objective: Vital Signs: LMP  (LMP Unknown)   Physical Exam well-developed well-nourished female no acute distress.  Alert and oriented x 3.  Ortho Exam right hip exam: Tenderness to palpation over the greater trochanter.  Pain with logroll and FADIR testing.  She is neurovascularly intact distally.  Specialty Comments:  No specialty comments available.  Imaging: XR HIP UNILAT W OR W/O PELVIS 2-3 VIEWS RIGHT Result Date: 01/11/2023 X-rays demonstrate advanced degenerative changes to the right hip joint.  No fracture noted.    PMFS History: Patient Active Problem List   Diagnosis Date Noted   Right hip pain 06/22/2022   Abscess, gluteal, left 11/30/2021   Seasonal and perennial allergic rhinoconjunctivitis 02/16/2021   Urinary frequency 12/05/2020   Left knee pain 10/01/2020   Food allergy 08/10/2020   Multiple drug allergies 08/10/2020   Oral allergy syndrome, subsequent encounter 08/10/2020   Other atopic dermatitis 08/10/2020   Toe pain, right 05/31/2020  Vitamin D  deficiency 05/25/2020   Costochondritis 03/01/2020   Left hand pain 03/01/2020   Abnormal cervical Papanicolaou smear 02/25/2020   Diabetes (HCC) 06/20/2019   Pulmonary embolism (HCC) 12/10/2017   Recurrent pulmonary embolism (HCC) 02/26/2017   Hypertension 10/17/2016   Hypokalemia 10/17/2016   Toxic conjunctivitis, left 08/12/2016   Encounter for well adult exam with abnormal findings 06/27/2016   Acute pain of right shoulder 06/27/2016   Foreign body in right foot 03/27/2016   Grade 2 ankle sprain 07/27/2015   Headache 04/27/2015   Hyperpigmentation 03/01/2015   Skin trauma 03/01/2015   Medial meniscus tear  01/13/2015   Effusion of left knee 01/13/2015   Encounter for therapeutic drug monitoring 07/07/2014   Pedal edema 04/28/2014   Chronic low back pain 02/03/2014   Burn of right foot 12/19/2013   Cellulitis 11/21/2013   Therapeutic drug monitoring 11/21/2013   Chronic anticoagulation 10/21/2013   Pulmonary embolus (HCC) 10/15/2013   Late effect of superficial injury 09/11/2013   Pruritus 09/11/2013   Acute pulmonary embolism (HCC) 09/04/2013   Deep venous thrombosis (HCC) 09/04/2013   Thrombocytopenic disorder (HCC) 09/04/2013   Syncope and collapse 09/04/2013   Grief 08/18/2013   Right arm pain 05/30/2013   Angioedema 03/14/2013   Obesity 05/13/2011   KELOID 06/21/2009   SKIN LESION 06/21/2009   Headache(784.0) 06/21/2009   B12 deficiency 03/18/2009   Hyperlipidemia 03/17/2009   Anemia 03/17/2009   Asthma 03/17/2009   ECZEMA 03/17/2009   DISC DISEASE, LUMBAR 03/17/2009   FATIGUE 03/17/2009   Cervical high risk HPV (human papillomavirus) test positive 01/03/1999   Chlamydial infection 01/02/1989   Past Medical History:  Diagnosis Date   ALLERGIC RHINITIS 03/17/2009   Anemia    ASTHMA 03/17/2009   DVT (deep venous thrombosis) (HCC)    ECZEMA 03/17/2009   Headache(784.0) 06/21/2009   recurrent   HYPERLIPIDEMIA 03/17/2009   KELOID 06/21/2009   Pulmonary emboli (HCC)    VITAMIN B12 DEFICIENCY 03/18/2009    Family History  Problem Relation Age of Onset   Hypertension Mother    Diabetes Mother    Cancer Father        Prostate Cancer   Hypertension Father    Eczema Sister    Allergies Sister    Angioedema Sister    Deep vein thrombosis Sister    Cancer Paternal Aunt        Ovarian Cancer   Cancer Other        Grandparent-Lung Cancer   Diabetes Other        Grandparent   Heart disease Other        Grandparent   Alcohol abuse Other        Several on both sides of family-3 uncles    Past Surgical History:  Procedure Laterality Date   KELOID EXCISION  mid 1990's    s/p keloid removal-laser    Social History   Occupational History   Occupation: Hair stylist  Tobacco Use   Smoking status: Former    Current packs/day: 0.00    Average packs/day: 0.3 packs/day for 4.1 years (1.0 ttl pk-yrs)    Types: Cigarettes    Start date: 03/17/2004    Quit date: 04/17/2008    Years since quitting: 14.7    Passive exposure: Never   Smokeless tobacco: Never   Tobacco comments:    quit 5 years ago  Vaping Use   Vaping status: Never Used  Substance and Sexual Activity   Alcohol use: Yes  Alcohol/week: 0.0 standard drinks of alcohol    Comment: socially   Drug use: No   Sexual activity: Not on file

## 2023-01-12 ENCOUNTER — Inpatient Hospital Stay: Payer: Medicaid Other | Admitting: Hematology & Oncology

## 2023-01-12 ENCOUNTER — Inpatient Hospital Stay: Payer: Medicaid Other

## 2023-01-25 ENCOUNTER — Inpatient Hospital Stay: Payer: Medicaid Other | Admitting: Medical Oncology

## 2023-01-25 ENCOUNTER — Encounter: Payer: Self-pay | Admitting: Medical Oncology

## 2023-01-25 ENCOUNTER — Inpatient Hospital Stay: Payer: Medicaid Other | Attending: Family

## 2023-01-25 VITALS — BP 124/83 | HR 51 | Temp 98.2°F | Resp 18 | Ht 67.0 in | Wt 263.0 lb

## 2023-01-25 DIAGNOSIS — I2699 Other pulmonary embolism without acute cor pulmonale: Secondary | ICD-10-CM | POA: Diagnosis not present

## 2023-01-25 DIAGNOSIS — I2692 Saddle embolus of pulmonary artery without acute cor pulmonale: Secondary | ICD-10-CM | POA: Diagnosis not present

## 2023-01-25 DIAGNOSIS — I82491 Acute embolism and thrombosis of other specified deep vein of right lower extremity: Secondary | ICD-10-CM

## 2023-01-25 DIAGNOSIS — Z7902 Long term (current) use of antithrombotics/antiplatelets: Secondary | ICD-10-CM | POA: Diagnosis not present

## 2023-01-25 DIAGNOSIS — D696 Thrombocytopenia, unspecified: Secondary | ICD-10-CM | POA: Diagnosis not present

## 2023-01-25 DIAGNOSIS — Z7901 Long term (current) use of anticoagulants: Secondary | ICD-10-CM | POA: Diagnosis not present

## 2023-01-25 DIAGNOSIS — D72819 Decreased white blood cell count, unspecified: Secondary | ICD-10-CM | POA: Diagnosis not present

## 2023-01-25 DIAGNOSIS — I82403 Acute embolism and thrombosis of unspecified deep veins of lower extremity, bilateral: Secondary | ICD-10-CM | POA: Insufficient documentation

## 2023-01-25 DIAGNOSIS — Z882 Allergy status to sulfonamides status: Secondary | ICD-10-CM | POA: Diagnosis not present

## 2023-01-25 DIAGNOSIS — Z86711 Personal history of pulmonary embolism: Secondary | ICD-10-CM | POA: Insufficient documentation

## 2023-01-25 LAB — CBC WITH DIFFERENTIAL (CANCER CENTER ONLY)
Abs Immature Granulocytes: 0.01 10*3/uL (ref 0.00–0.07)
Basophils Absolute: 0 10*3/uL (ref 0.0–0.1)
Basophils Relative: 0 %
Eosinophils Absolute: 0.2 10*3/uL (ref 0.0–0.5)
Eosinophils Relative: 3 %
HCT: 37.6 % (ref 36.0–46.0)
Hemoglobin: 12.2 g/dL (ref 12.0–15.0)
Immature Granulocytes: 0 %
Lymphocytes Relative: 54 %
Lymphs Abs: 2.5 10*3/uL (ref 0.7–4.0)
MCH: 27.5 pg (ref 26.0–34.0)
MCHC: 32.4 g/dL (ref 30.0–36.0)
MCV: 84.7 fL (ref 80.0–100.0)
Monocytes Absolute: 0.3 10*3/uL (ref 0.1–1.0)
Monocytes Relative: 6 %
Neutro Abs: 1.7 10*3/uL (ref 1.7–7.7)
Neutrophils Relative %: 37 %
Platelet Count: 183 10*3/uL (ref 150–400)
RBC: 4.44 MIL/uL (ref 3.87–5.11)
RDW: 14.7 % (ref 11.5–15.5)
WBC Count: 4.6 10*3/uL (ref 4.0–10.5)
nRBC: 0 % (ref 0.0–0.2)

## 2023-01-25 LAB — CMP (CANCER CENTER ONLY)
ALT: 13 U/L (ref 0–44)
AST: 14 U/L — ABNORMAL LOW (ref 15–41)
Albumin: 4.4 g/dL (ref 3.5–5.0)
Alkaline Phosphatase: 72 U/L (ref 38–126)
Anion gap: 5 (ref 5–15)
BUN: 13 mg/dL (ref 6–20)
CO2: 30 mmol/L (ref 22–32)
Calcium: 9.5 mg/dL (ref 8.9–10.3)
Chloride: 106 mmol/L (ref 98–111)
Creatinine: 0.59 mg/dL (ref 0.44–1.00)
GFR, Estimated: >60 mL/min (ref >60)
Glucose, Bld: 103 mg/dL — ABNORMAL HIGH (ref 70–99)
Potassium: 4.7 mmol/L (ref 3.5–5.1)
Sodium: 141 mmol/L (ref 135–145)
Total Bilirubin: 0.4 mg/dL (ref 0.0–1.2)
Total Protein: 7.5 g/dL (ref 6.5–8.1)

## 2023-01-25 LAB — LACTATE DEHYDROGENASE: LDH: 160 U/L (ref 98–192)

## 2023-01-25 MED ORDER — DABIGATRAN ETEXILATE MESYLATE 150 MG PO CAPS: ORAL_CAPSULE | ORAL | 6 refills | Status: DC

## 2023-01-25 NOTE — Progress Notes (Signed)
Hematology and Oncology Follow Up Visit  Hannah Thompson [id number] Mar 09, 1970 53 y.o. 01/25/2023   Principle Diagnosis:  Recurrent PE with right heart stain - Idiopathic Bilateral lower extremity DVT Ethnic associated leukopenia Transient thrombocytopenia   Current Therapy:        Pradaxa 150 mg PO BID - long term maintanence    Interim History:  Hannah Thompson is here today for follow up:  She reports that she has been doing well. She is about to go to [place] to watch her [place] from [place] [employer] marching [place]. She is tolerating her Pradaxa well without side effects.  No new thrombotic events since  07/2020.  No issue with blood loss. No bruising or petechiae.  No fever, chills, n/v, cough, rash, dizziness, SOB, chest pain, palpitations, abdominal pain or changes in bowel or bladder habits.  No swelling, tenderness, numbness or tingling in her extremities at this time.  No falls or syncope.  Appetite and hydration are good.  Wt Readings from Last 3 Encounters:  01/25/23 263 lb 0.6 oz (119.3 kg)  12/21/22 263 lb (119.3 kg)  12/20/22 265 lb (120.2 kg)    ECOG Performance Status: 0 - Asymptomatic  Medications:  Allergies as of 01/25/2023       Reactions   Avocado Nausea And Vomiting   Projectile N/V   Fish Allergy Anaphylaxis, Swelling   Pollock.Swelling of lips, throat and face.   Oxaprozin    angioedema   Sulfonamide Derivatives    REACTION: hives   Latex Rash   Orange Juice [orange Oil] Hives   Orange juice   Tomato Hives   tomatoes   Zyrtec [cetirizine] Hives, Rash        Medication List        Accurate as of January 25, 2023 11:16 AM. If you have any questions, ask your nurse or doctor.          cromolyn 4 % ophthalmic solution Commonly known as: OPTICROM Place 1 drop into both eyes 4 (four) times daily as needed (itchy/watery eyes).   dabigatran 150 MG Caps capsule Commonly known as: Pradaxa TAKE 1 CAPSULE BY MOUTH EVERY 12 HOURS    desonide 0.05 % cream Commonly known as: DESOWEN Apply topically 2 (two) times daily as needed (mild rash flare). okay to use on the face, neck, groin area. Do not use more than 1 week at a time.   EPINEPHrine 0.3 mg/0.3 mL Soaj injection Commonly known as: EPI-PEN Inject 0.3 mg into the muscle as needed for anaphylaxis.   fluticasone 50 MCG/ACT nasal spray Commonly known as: FLONASE Place 1 spray into both nostrils 2 (two) times daily as needed (nasal congestion).   ibuprofen 800 MG tablet Commonly known as: ADVIL Take 1 tablet (800 mg total) by mouth every 8 (eight) hours as needed (pain). Take with food.   levocetirizine 5 MG tablet Commonly known as: XYZAL Take 1 tablet (5 mg total) by mouth 2 (two) times daily as needed for allergies (Can take an extra dose during flare ups.).   lidocaine 5 % Commonly known as: Lidoderm Place 1 patch onto the skin daily. Remove & Discard patch within 12 hours   methocarbamol 500 MG tablet Commonly known as: ROBAXIN Take 1 tablet (500 mg total) by mouth every 8 (eight) hours as needed for muscle spasms.   montelukast 10 MG tablet Commonly known as: Singulair Take 1 tablet (10 mg total) by mouth at bedtime as needed (allergies).   tirzepatide  2.5 MG/0.5ML Pen Commonly known as: MOUNJARO Inject 2.5 mg into the skin once a week. E11.9   triamcinolone cream 0.1 % Commonly known as: KENALOG Apply 1 Application topically 2 (two) times daily as needed (moderate eczema). Do not use on the face, neck, armpits or groin area. Do not use more than 3 weeks in a row.        Allergies:  Allergies  Allergen Reactions   Avocado Nausea And Vomiting    Projectile N/V   Fish Allergy Anaphylaxis and Swelling    Pollock.Swelling of lips, throat and face.   Oxaprozin     angioedema   Sulfonamide Derivatives     REACTION: hives   Latex Rash   Orange Juice [Orange Oil] Hives    Orange juice   Tomato Hives    tomatoes   Zyrtec [Cetirizine]  Hives and Rash    Past Medical History, Surgical history, Social history, and Family History were reviewed and updated.  Review of Systems: All other 10 point review of systems is negative.   Physical Exam:  height is 5\' 7"  (1.702 m) and weight is 263 lb 0.6 oz (119.3 kg). Her oral temperature is 98.2 F (36.8 C). Her blood pressure is 124/83 and her pulse is 51 (abnormal). Her respiration is 18 and oxygen saturation is 100%.   Wt Readings from Last 3 Encounters:  01/25/23 263 lb 0.6 oz (119.3 kg)  12/21/22 263 lb (119.3 kg)  12/20/22 265 lb (120.2 kg)    Ocular: Sclerae unicteric, pupils equal, round and reactive to light Ear-nose-throat: Oropharynx clear, dentition fair Lymphatic: No cervical or supraclavicular adenopathy Lungs no rales or rhonchi, good excursion bilaterally Heart regular rate and rhythm, no murmur appreciated Abd soft, nontender, positive bowel sounds MSK no focal spinal tenderness, no joint edema Neuro: non-focal, well-oriented, appropriate affect  Lab Results  Component Value Date   WBC 4.6 01/25/2023   HGB 12.2 01/25/2023   HCT 37.6 01/25/2023   MCV 84.7 01/25/2023   PLT 183 01/25/2023   Lab Results  Component Value Date   FERRITIN 242 08/16/2015   IRON 76 08/16/2015   TIBC 251 08/16/2015   UIBC 175 08/16/2015   IRONPCTSAT 30 08/16/2015   Lab Results  Component Value Date   RBC 4.44 01/25/2023   No results found for: "KPAFRELGTCHN", "LAMBDASER", "KAPLAMBRATIO" No results found for: "IGGSERUM", "IGA", "IGMSERUM" No results found for: "TOTALPROTELP", "ALBUMINELP", "A1GS", "A2GS", "BETS", "BETA2SER", "GAMS", "MSPIKE", "SPEI"   Chemistry      Component Value Date/Time   NA 141 01/25/2023 1035   NA 139 10/23/2016 1306   NA 140 09/25/2016 1145   K 4.7 01/25/2023 1035   K 3.8 10/23/2016 1306   K 4.1 09/25/2016 1145   CL 106 01/25/2023 1035   CL 107 10/23/2016 1306   CO2 30 01/25/2023 1035   CO2 26 10/23/2016 1306   CO2 28 09/25/2016 1145    BUN 13 01/25/2023 1035   BUN 8 10/23/2016 1306   BUN 9.5 09/25/2016 1145   CREATININE 0.59 01/25/2023 1035   CREATININE 0.8 10/23/2016 1306   CREATININE 0.8 09/25/2016 1145      Component Value Date/Time   CALCIUM 9.5 01/25/2023 1035   CALCIUM 8.7 10/23/2016 1306   CALCIUM 9.6 09/25/2016 1145   ALKPHOS 72 01/25/2023 1035   ALKPHOS 67 10/23/2016 1306   ALKPHOS 72 09/25/2016 1145   AST 14 (L) 01/25/2023 1035   AST 17 09/25/2016 1145   ALT 13 01/25/2023 1035  ALT 26 10/23/2016 1306   ALT 11 09/25/2016 1145   BILITOT 0.4 01/25/2023 1035   BILITOT 0.26 09/25/2016 1145     Encounter Diagnoses  Name Primary?   Recurrent pulmonary embolism (HCC)    Chronic anticoagulation Yes   Impression and Plan: Ms. Moudy is a very pleasant 53 yo African American female with significant history of thromboembolic disease with a pulmonary embolism with right heart strain as well as bilateral lower extremity DVT's (while off of Pradaxa for 2 weeks). She is now back on her Pradaxa without recurrence.   Today CBC is normal. CMP stable.  She is not having any negative side effects to the Pradaxa.   RTC 6 months me, labs (CBC, CMP)  Rushie Chestnut, PA-C 1/23/202511:16 AM

## 2023-02-03 DIAGNOSIS — Z419 Encounter for procedure for purposes other than remedying health state, unspecified: Secondary | ICD-10-CM | POA: Diagnosis not present

## 2023-02-16 ENCOUNTER — Encounter: Payer: Medicaid Other | Admitting: Radiology

## 2023-02-28 ENCOUNTER — Ambulatory Visit (INDEPENDENT_AMBULATORY_CARE_PROVIDER_SITE_OTHER): Payer: Medicaid Other | Admitting: Allergy

## 2023-02-28 ENCOUNTER — Encounter: Payer: Self-pay | Admitting: Allergy

## 2023-02-28 ENCOUNTER — Other Ambulatory Visit: Payer: Self-pay

## 2023-02-28 VITALS — BP 122/80 | HR 76 | Temp 97.9°F | Resp 18

## 2023-02-28 DIAGNOSIS — L2389 Allergic contact dermatitis due to other agents: Secondary | ICD-10-CM

## 2023-02-28 DIAGNOSIS — L2089 Other atopic dermatitis: Secondary | ICD-10-CM

## 2023-02-28 DIAGNOSIS — Z889 Allergy status to unspecified drugs, medicaments and biological substances status: Secondary | ICD-10-CM

## 2023-02-28 DIAGNOSIS — L301 Dyshidrosis [pompholyx]: Secondary | ICD-10-CM

## 2023-02-28 DIAGNOSIS — H1013 Acute atopic conjunctivitis, bilateral: Secondary | ICD-10-CM

## 2023-02-28 DIAGNOSIS — T781XXD Other adverse food reactions, not elsewhere classified, subsequent encounter: Secondary | ICD-10-CM

## 2023-02-28 DIAGNOSIS — J301 Allergic rhinitis due to pollen: Secondary | ICD-10-CM

## 2023-02-28 DIAGNOSIS — J3089 Other allergic rhinitis: Secondary | ICD-10-CM

## 2023-02-28 DIAGNOSIS — Z91038 Other insect allergy status: Secondary | ICD-10-CM | POA: Diagnosis not present

## 2023-02-28 DIAGNOSIS — J3081 Allergic rhinitis due to animal (cat) (dog) hair and dander: Secondary | ICD-10-CM | POA: Diagnosis not present

## 2023-02-28 DIAGNOSIS — Z888 Allergy status to other drugs, medicaments and biological substances status: Secondary | ICD-10-CM

## 2023-02-28 MED ORDER — CROMOLYN SODIUM 4 % OP SOLN
1.0000 [drp] | Freq: Four times a day (QID) | OPHTHALMIC | 5 refills | Status: AC | PRN
Start: 2023-02-28 — End: ?

## 2023-02-28 MED ORDER — DESONIDE 0.05 % EX CREA
TOPICAL_CREAM | Freq: Two times a day (BID) | CUTANEOUS | 3 refills | Status: AC | PRN
Start: 2023-02-28 — End: ?

## 2023-02-28 MED ORDER — TRIAMCINOLONE ACETONIDE 0.1 % EX CREA: 1.0000 | TOPICAL_CREAM | Freq: Two times a day (BID) | CUTANEOUS | 3 refills | Status: DC | PRN

## 2023-02-28 MED ORDER — FLUTICASONE PROPIONATE 50 MCG/ACT NA SUSP: 1.0000 | Freq: Every day | NASAL | 5 refills | Status: AC | PRN

## 2023-02-28 MED ORDER — LEVOCETIRIZINE DIHYDROCHLORIDE 5 MG PO TABS
5.0000 mg | ORAL_TABLET | Freq: Two times a day (BID) | ORAL | 3 refills | Status: AC | PRN
Start: 2023-02-28 — End: ?

## 2023-02-28 MED ORDER — EPINEPHRINE 0.3 MG/0.3ML IJ SOAJ: 0.3000 mg | INTRAMUSCULAR | 1 refills | Status: AC | PRN

## 2023-02-28 MED ORDER — MONTELUKAST SODIUM 10 MG PO TABS: 10.0000 mg | ORAL_TABLET | Freq: Every evening | ORAL | 3 refills | Status: AC | PRN

## 2023-02-28 NOTE — Progress Notes (Signed)
 Follow Up Note  RE: Hannah Thompson MRN: [id number] DOB: 01-May-1970 Date of Office Visit: 02/28/2023  Referring provider: Corwin Levins, MD Primary care provider: Corwin Levins, MD  Chief Complaint: Allergies  History of Present Illness: I had the pleasure of seeing Hannah Thompson for a follow up visit at the [facility] on 02/28/2023. She is a 53 y.o. female, who is being followed for allergic rhinoconjunctivitis, food allergy, atopic dermatitis and multiple drug allergies. Her previous allergy office visit was on 08/28/2022 with Dr. Selena Batten. Today is a regular follow up visit.  Discussed the use of AI scribe software for clinical note transcription with the patient, who gave verbal consent to proceed.    She has experienced mild allergy symptoms over the past few months. She is currently taking levocetirizine once daily at night and montelukast daily. She has not needed to use her nasal sprays recently but uses eye drops frequently due to symptoms such as itchy or runny eyes.   She has a history of contact dermatitis, particularly on her hands, which she attributes to using certain hair products and shampoos. She tries to wear gloves but finds them ineffective due to her nails. She works at a [employer]. She uses desonide and triamcinolone creams every other day due to frequent flare-ups, which cause cracking but no bleeding. She notes that her hands are often wet, which may contribute to the condition.  She has not had any recent allergic reactions and has not needed to use an EpiPen, although she acknowledges that her EpiPen is expired. She continues to avoid certain foods such as melons, oranges, tomatoes, coconut, avocado, and plantains.     Assessment and Plan: Hannah Thompson is a 53 y.o. female with: Seasonal allergic rhinitis due to pollen Allergic rhinitis due to animal dander Allergic rhinitis due to dust mite Allergic rhinitis due to mold Allergy to cockroaches Allergic  conjunctivitis of both eyes Past history - Zyrtec caused rash and swelling.  Skin testing over 20 years ago showed multiple positives per patient report and was on AIT for a few years with good benefit. 2022 skin testing showed: Positive to grass, weed pollen, ragweed, trees, mold, dust mites, cat, feathers, cockroach. Interim history - Mild symptoms, currently managed with levocetirizine and montelukast.  Continue environmental control measures. Continue Xyzal (levocetirizine) 5mg  daily. May take twice a day during allergy flares.  Continue Singulair (montelukast) 10mg  daily at night. Use Flonase (fluticasone) nasal spray 1-2 sprays per nostril once a day as needed for nasal congestion.  Nasal saline spray (i.e., Simply Saline) or nasal saline lavage (i.e., NeilMed) is recommended as needed and prior to medicated nasal sprays. Use cromolyn 4% 1 drop in each eye up to four times a day as needed for itchy/watery eyes.  Consider allergy injections for long term control if above medications do not help the symptoms.  Food allergy Past history - Fresh tomatoes cause facial pruritus/?tongue swelling. Tolerates processed tomatoes. Coconut causes contact rash and perioral pruritus. Melons/oranges cause perioral pruritus. Avocados caused vomiting/pruritus. Pollock caused nausea, pruritus and eye swelling. Eats other seafood with no issues. 2022 skin testing showed: Borderline positive to soy, hazelnut, pistachio, orange, cantaloupe and watermelon. Continue to avoid foods that are bothersome - melons, oranges, coconut, fresh tomatoes, avocado, pollock. For mild symptoms you can take over the counter antihistamines such as Benadryl and monitor symptoms closely. If symptoms worsen or if you have severe symptoms including breathing issues, throat closure, significant swelling,  whole body hives, severe diarrhea and vomiting, lightheadedness then inject epinephrine and seek immediate medical care afterwards. Action  plan in place.    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 Current management with triamcinolone and Desonide creams. Continue proper skin care. Use desonide 0.05% cream twice a day as needed for mild rash flares - okay to use on the face, neck, groin area. Do not use more than 1 week at a time. Use triamcinolone 0.1% cream twice a day as needed for rash flares. Do not use on the face, neck, armpits or groin area. Do not use more than 3 weeks in a row.  Read about Dupixent injections - handout given. This will be an injection every 2 weeks.  If you want to start before the next visit - call our office to let us know so I can start the paperwork process.    Multiple drug allergies Continue to avoid drugs on your allergy list.    Return in about 6 months (around 08/28/2023).  Meds ordered this encounter  Medications   cromolyn (OPTICROM) 4 % ophthalmic solution    Sig: Place 1 drop into both eyes 4 (four) times daily as needed (itchy/watery eyes).    Dispense:  10 mL    Refill:  5   montelukast (SINGULAIR) 10 MG tablet    Sig: Take 1 tablet (10 mg total) by mouth at bedtime as needed (allergies).    Dispense:  90 tablet    Refill:  3   levocetirizine (XYZAL) 5 MG tablet    Sig: Take 1 tablet (5 mg total) by mouth 2 (two) times daily as needed for allergies (Can take an extra dose during flare ups.).    Dispense:  90 tablet    Refill:  3   EPINEPHrine 0.3 mg/0.3 mL IJ SOAJ injection    Sig: Inject 0.3 mg into the muscle as needed for anaphylaxis.    Dispense:  2 each    Refill:  1    May dispense generic/Mylan/Teva brand.   fluticasone (FLONASE) 50 MCG/ACT nasal spray    Sig: Place 1-2 sprays into both nostrils daily as needed (nasal congestion).    Dispense:  16 g    Refill:  5   desonide (DESOWEN) 0.05 % cream    Sig: Apply topically 2 (two) times daily as needed (mild  rash flare). okay to use on the face, neck, groin area. Do not use more than 1 week at a time.    Dispense:  30 g    Refill:  3   triamcinolone cream (KENALOG) 0.1 %    Sig: Apply 1 Application topically 2 (two) times daily as needed (moderate eczema). Do not use on the face, neck, armpits or groin area. Do not use more than 3 weeks in a row.    Dispense:  45 g    Refill:  3   Lab Orders  No laboratory test(s) ordered today    Diagnostics: None.  Medication List:  Current Outpatient Medications  Medication Sig Dispense Refill   dabigatran (PRADAXA) 150 MG CAPS capsule TAKE 1 CAPSULE BY MOUTH EVERY 12 HOURS 60 capsule 6   EPINEPHrine 0.3 mg/0.3 mL IJ SOAJ injection Inject 0.3 mg into the muscle as needed for anaphylaxis. 2 each 1   fluticasone (FLONASE) 50 MCG/ACT nasal spray Place 1-2 sprays into both nostrils daily as needed (nasal congestion). 16 g 5  ibuprofen (ADVIL) 800 MG tablet Take 1 tablet (800 mg total) by mouth every 8 (eight) hours as needed (pain). Take with food. 30 tablet 0   lidocaine (LIDODERM) 5 % Place 1 patch onto the skin daily. Remove & Discard patch within 12 hours 14 patch 0   methocarbamol (ROBAXIN) 500 MG tablet Take 1 tablet (500 mg total) by mouth every 8 (eight) hours as needed for muscle spasms. 30 tablet 0   cromolyn (OPTICROM) 4 % ophthalmic solution Place 1 drop into both eyes 4 (four) times daily as needed (itchy/watery eyes). 10 mL 5   desonide (DESOWEN) 0.05 % cream Apply topically 2 (two) times daily as needed (mild rash flare). okay to use on the face, neck, groin area. Do not use more than 1 week at a time. 30 g 3   levocetirizine (XYZAL) 5 MG tablet Take 1 tablet (5 mg total) by mouth 2 (two) times daily as needed for allergies (Can take an extra dose during flare ups.). 90 tablet 3   montelukast (SINGULAIR) 10 MG tablet Take 1 tablet (10 mg total) by mouth at bedtime as needed (allergies). 90 tablet 3   triamcinolone cream (KENALOG) 0.1 % Apply 1  Application topically 2 (two) times daily as needed (moderate eczema). Do not use on the face, neck, armpits or groin area. Do not use more than 3 weeks in a row. 45 g 3   No current facility-administered medications for this visit.   Allergies: Allergies  Allergen Reactions   Avocado Nausea And Vomiting    Projectile N/V   Fish Allergy Anaphylaxis and Swelling    Pollock.Swelling of lips, throat and face.   Oxaprozin     angioedema   Sulfonamide Derivatives     REACTION: hives   Latex Rash   Orange Juice [Orange Oil] Hives    Orange juice   Tomato Hives    tomatoes   Zyrtec [Cetirizine] Hives and Rash   I reviewed her past medical history, social history, family history, and environmental history and no significant changes have been reported from her previous visit.  Review of Systems  Constitutional:  Negative for appetite change, chills, fever and unexpected weight change.  HENT:  Negative for congestion and rhinorrhea.   Eyes:  Negative for itching.  Respiratory:  Negative for cough, chest tightness, shortness of breath and wheezing.   Cardiovascular:  Negative for chest pain.  Gastrointestinal:  Negative for abdominal pain.  Genitourinary:  Negative for difficulty urinating.  Skin:  Positive for rash.  Allergic/Immunologic: Positive for environmental allergies and food allergies.  Neurological:  Negative for headaches.    Objective: BP 122/80 (BP Location: Left Arm, Patient Position: Sitting, Cuff Size: Large)   Pulse 76   Temp 97.9 F (36.6 C) (Temporal)   Resp 18   LMP  (LMP Unknown)   SpO2 100%  There is no height or weight on file to calculate BMI. Physical Exam Vitals and nursing note reviewed.  Constitutional:      Appearance: Normal appearance. She is well-developed.  HENT:     Head: Normocephalic and atraumatic.     Right Ear: Tympanic membrane and external ear normal.     Left Ear: Tympanic membrane and external ear normal.     Nose: Nose normal.      Mouth/Throat:     Mouth: Mucous membranes are moist.     Pharynx: Oropharynx is clear.  Eyes:     Conjunctiva/sclera: Conjunctivae normal.  Cardiovascular:  Rate and Rhythm: Normal rate and regular rhythm.     Heart sounds: Normal heart sounds. No murmur heard.    No friction rub. No gallop.  Pulmonary:     Effort: Pulmonary effort is normal.     Breath sounds: Normal breath sounds. No wheezing, rhonchi or rales.  Abdominal:     Palpations: Abdomen is soft.  Musculoskeletal:     Cervical back: Neck supple.  Skin:    General: Skin is warm and dry.     Findings: No rash.     Comments: Leathery skin changes with hyperpigmentation on fingers b/l with some fissures noted interdigitally b/l.   Neurological:     Mental Status: She is alert and oriented to person, place, and time.   Previous notes and tests were reviewed. The plan was reviewed with the patient/family, and all questions/concerned were addressed.  It was my pleasure to see Hannah Thompson today and participate in her care. Please feel free to contact me with any questions or concerns.  Sincerely,  Wyline Mood, DO Allergy & Immunology  [facility] office: [phone] [facility] office: [phone]

## 2023-02-28 NOTE — Patient Instructions (Addendum)
 Environmental allergies 2022 skin testing positive to grass, weed pollen, ragweed, trees, mold, dust mites, cat, feathers, cockroach. Continue environmental control measures. Continue Xyzal (levocetirizine) 5mg  daily. May take twice a day during allergy flares.  Continue Singulair (montelukast) 10mg  daily at night. Use Flonase (fluticasone) nasal spray 1-2 sprays per nostril once a day as needed for nasal congestion.  Nasal saline spray (i.e., Simply Saline) or nasal saline lavage (i.e., NeilMed) is recommended as needed and prior to medicated nasal sprays. Use cromolyn 4% 1 drop in each eye up to four times a day as needed for itchy/watery eyes.  Consider allergy injections for long term control if above medications do not help the symptoms.  Food Continue to avoid foods that are bothersome - melons, oranges, coconut, fresh tomatoes, avocado, pollock. For mild symptoms you can take over the counter antihistamines such as Benadryl and monitor symptoms closely. If symptoms worsen or if you have severe symptoms including breathing issues, throat closure, significant swelling, whole body hives, severe diarrhea and vomiting, lightheadedness then inject epinephrine and seek immediate medical care afterwards. Action plan in place.   Drugs Continue to avoid drugs on your allergy list.   Skin Continue proper skin care. Use desonide 0.05% cream twice a day as needed for mild rash flares - okay to use on the face, neck, groin area. Do not use more than 1 week at a time. Use triamcinolone 0.1% cream twice a day as needed for rash flares. Do not use on the face, neck, armpits or groin area. Do not use more than 3 weeks in a row.  Read about Dupixent injections - handout given. This will be an injection every 2 weeks.  If you want to start before the next visit - call our office to let us know so I can start the paperwork process.   Follow up in 6 months or sooner if needed.   Skin care  recommendations  Bath time: Always use lukewarm water. AVOID very hot or cold water. Keep bathing time to 5-10 minutes. Do NOT use bubble bath. Use a mild soap and use just enough to wash the dirty areas. Do NOT scrub skin vigorously.  After bathing, pat dry your skin with a towel. Do NOT rub or scrub the skin.  Moisturizers and prescriptions:  ALWAYS apply moisturizers immediately after bathing (within 3 minutes). This helps to lock-in moisture. Use the moisturizer several times a day over the whole body. Good summer moisturizers include: Aveeno, CeraVe, Cetaphil. Good winter moisturizers include: Aquaphor, Vaseline, Cerave, Cetaphil, Eucerin, Vanicream. When using moisturizers along with medications, the moisturizer should be applied about one hour after applying the medication to prevent diluting effect of the medication or moisturize around where you applied the medications. When not using medications, the moisturizer can be continued twice daily as maintenance.  Laundry and clothing: Avoid laundry products with added color or perfumes. Use unscented hypo-allergenic laundry products such as Tide free, Cheer free & gentle, and All free and clear.  If the skin still seems dry or sensitive, you can try double-rinsing the clothes. Avoid tight or scratchy clothing such as wool. Do not use fabric softeners or dyer sheets.

## 2023-03-03 DIAGNOSIS — Z419 Encounter for procedure for purposes other than remedying health state, unspecified: Secondary | ICD-10-CM | POA: Diagnosis not present

## 2023-03-09 ENCOUNTER — Encounter (HOSPITAL_BASED_OUTPATIENT_CLINIC_OR_DEPARTMENT_OTHER): Payer: Self-pay | Admitting: Emergency Medicine

## 2023-03-09 ENCOUNTER — Emergency Department (HOSPITAL_BASED_OUTPATIENT_CLINIC_OR_DEPARTMENT_OTHER)

## 2023-03-09 ENCOUNTER — Other Ambulatory Visit: Payer: Self-pay

## 2023-03-09 ENCOUNTER — Emergency Department (HOSPITAL_BASED_OUTPATIENT_CLINIC_OR_DEPARTMENT_OTHER)
Admission: EM | Admit: 2023-03-09 | Discharge: 2023-03-09 | Disposition: A | Discharge status: Home / Self Care | Attending: Emergency Medicine | Admitting: Emergency Medicine

## 2023-03-09 DIAGNOSIS — R0789 Other chest pain: Secondary | ICD-10-CM | POA: Diagnosis not present

## 2023-03-09 DIAGNOSIS — R911 Solitary pulmonary nodule: Secondary | ICD-10-CM | POA: Diagnosis not present

## 2023-03-09 DIAGNOSIS — E119 Type 2 diabetes mellitus without complications: Secondary | ICD-10-CM | POA: Insufficient documentation

## 2023-03-09 DIAGNOSIS — R918 Other nonspecific abnormal finding of lung field: Secondary | ICD-10-CM

## 2023-03-09 DIAGNOSIS — R590 Localized enlarged lymph nodes: Secondary | ICD-10-CM | POA: Diagnosis not present

## 2023-03-09 DIAGNOSIS — Z91040 Latex allergy status: Secondary | ICD-10-CM | POA: Diagnosis not present

## 2023-03-09 DIAGNOSIS — R079 Chest pain, unspecified: Secondary | ICD-10-CM | POA: Insufficient documentation

## 2023-03-09 LAB — CBC
HCT: 34.2 % — ABNORMAL LOW (ref 36.0–46.0)
Hemoglobin: 11.2 g/dL — ABNORMAL LOW (ref 12.0–15.0)
MCH: 27.7 pg (ref 26.0–34.0)
MCHC: 32.7 g/dL (ref 30.0–36.0)
MCV: 84.4 fL (ref 80.0–100.0)
Platelets: 150 10*3/uL (ref 150–400)
RBC: 4.05 MIL/uL (ref 3.87–5.11)
RDW: 15.3 % (ref 11.5–15.5)
WBC: 4.5 10*3/uL (ref 4.0–10.5)
nRBC: 0 % (ref 0.0–0.2)

## 2023-03-09 LAB — BASIC METABOLIC PANEL
Anion gap: 5 (ref 5–15)
BUN: 9 mg/dL (ref 6–20)
CO2: 27 mmol/L (ref 22–32)
Calcium: 8.9 mg/dL (ref 8.9–10.3)
Chloride: 105 mmol/L (ref 98–111)
Creatinine, Ser: 0.68 mg/dL (ref 0.44–1.00)
GFR, Estimated: >60 mL/min (ref >60)
Glucose, Bld: 93 mg/dL (ref 70–99)
Potassium: 3.6 mmol/L (ref 3.5–5.1)
Sodium: 137 mmol/L (ref 135–145)

## 2023-03-09 LAB — TROPONIN I (HIGH SENSITIVITY)
Troponin I (High Sensitivity): 3 ng/L (ref <18)
Troponin I (High Sensitivity): 3 ng/L (ref <18)

## 2023-03-09 MED ORDER — HYDROCODONE-ACETAMINOPHEN 5-325 MG PO TABS: 1.0000 | ORAL_TABLET | Freq: Four times a day (QID) | ORAL | 0 refills | Status: AC | PRN

## 2023-03-09 MED ORDER — HYDROCODONE-ACETAMINOPHEN 5-325 MG PO TABS
2.0000 | ORAL_TABLET | Freq: Once | ORAL | Status: AC
  Administered 2023-03-09: 2 via ORAL
  Filled 2023-03-09: qty 2

## 2023-03-09 MED ORDER — IOHEXOL 350 MG/ML SOLN
100.0000 mL | Freq: Once | INTRAVENOUS | Status: AC | PRN
Start: 2023-03-09 — End: 2023-03-09
  Administered 2023-03-09: 100 mL via INTRAVENOUS

## 2023-03-09 NOTE — ED Notes (Signed)
 Pt adfvised she has a history of PE"s, has been compliant with her medicines for same. Has chronic SOB going up stairs, and "feels like that's normal for her". No new pain, but having chronic centralized CP with exertion. 9/10. Pt warm and dry, vitals as shown. No n/v/d.

## 2023-03-09 NOTE — ED Provider Notes (Signed)
 [place] Provider Note   CSN: [id number] Arrival date & time: 03/09/23  1300     History  Chief Complaint  Patient presents with   Chest Pain    Hannah Thompson is a 53 y.o. female.  Patient complains of pain in her chest.  Patient reports that she has a history of pulmonary embolism.  Patient reports the pain is similar to when she had a pulmonary embolus in the past. Pt reports that she recently has missed some dosages of her blood thinner.  Patient denies any fever or chills she denies any exposure to flu or COVID.  Patient has been evaluated by oncology for cause of pulmonary embolus.  Patient reports she is diabetic.  She reports she has not had any previous heart disease.  The history is provided by the patient. No language interpreter was used.  Chest Pain Pain quality: not aching   Pain radiates to:  Does not radiate Timing:  Constant Progression:  Worsening Chronicity:  New Relieved by:  Nothing Worsened by:  Nothing Ineffective treatments:  None tried Associated symptoms: cough   Risk factors: no hypertension        Home Medications Prior to Admission medications   Medication Sig Start Date End Date Taking? Authorizing Provider  cromolyn (OPTICROM) 4 % ophthalmic solution Place 1 drop into both eyes 4 (four) times daily as needed (itchy/watery eyes). 02/28/23   Ellamae Sia, DO  dabigatran (PRADAXA) 150 MG CAPS capsule TAKE 1 CAPSULE BY MOUTH EVERY 12 HOURS 01/25/23   Covington, Sarah M, PA-C  desonide (DESOWEN) 0.05 % cream Apply topically 2 (two) times daily as needed (mild rash flare). okay to use on the face, neck, groin area. Do not use more than 1 week at a time. 02/28/23   Ellamae Sia, DO  EPINEPHrine 0.3 mg/0.3 mL IJ SOAJ injection Inject 0.3 mg into the muscle as needed for anaphylaxis. 02/28/23   Ellamae Sia, DO  fluticasone (FLONASE) 50 MCG/ACT nasal spray Place 1-2 sprays into both nostrils daily as needed  (nasal congestion). 02/28/23   Ellamae Sia, DO  ibuprofen (ADVIL) 800 MG tablet Take 1 tablet (800 mg total) by mouth every 8 (eight) hours as needed (pain). Take with food. 09/12/22   Salvatore Decent, FNP  levocetirizine (XYZAL) 5 MG tablet Take 1 tablet (5 mg total) by mouth 2 (two) times daily as needed for allergies (Can take an extra dose during flare ups.). 02/28/23   Ellamae Sia, DO  lidocaine (LIDODERM) 5 % Place 1 patch onto the skin daily. Remove & Discard patch within 12 hours 09/10/22   Rising, Lurena Joiner, PA-C  methocarbamol (ROBAXIN) 500 MG tablet Take 1 tablet (500 mg total) by mouth every 8 (eight) hours as needed for muscle spasms. 09/12/22   Salvatore Decent, FNP  montelukast (SINGULAIR) 10 MG tablet Take 1 tablet (10 mg total) by mouth at bedtime as needed (allergies). 02/28/23   Ellamae Sia, DO  triamcinolone cream (KENALOG) 0.1 % Apply 1 Application topically 2 (two) times daily as needed (moderate eczema). Do not use on the face, neck, armpits or groin area. Do not use more than 3 weeks in a row. 02/28/23   Ellamae Sia, DO      Allergies    Avocado, Fish allergy, Oxaprozin, Sulfonamide derivatives, Latex, Orange juice [orange oil], Tomato, and Zyrtec [cetirizine]    Review of Systems   Review of Systems  Respiratory:  Positive for cough.   Cardiovascular:  Positive for chest pain.  All other systems reviewed and are negative.   Physical Exam Updated Vital Signs BP (!) 153/98   Pulse 83   Temp 99.4 F (37.4 C) (Oral)   Resp 12   Ht 5\' 7"  (1.702 m)   Wt 120.2 kg   LMP  (LMP Unknown)   SpO2 100%   BMI 41.50 kg/m  Physical Exam Vitals and nursing note reviewed.  Constitutional:      Appearance: She is well-developed.  HENT:     Head: Normocephalic.  Cardiovascular:     Rate and Rhythm: Normal rate.     Heart sounds: Normal heart sounds.  Pulmonary:     Effort: Pulmonary effort is normal.     Breath sounds: Normal breath sounds.  Abdominal:     General: There is no  distension.     Palpations: Abdomen is soft.  Musculoskeletal:        General: Normal range of motion.     Cervical back: Normal range of motion.  Skin:    General: Skin is warm.  Neurological:     General: No focal deficit present.     Mental Status: She is alert and oriented to person, place, and time.  Psychiatric:        Mood and Affect: Mood normal.     ED Results / Procedures / Treatments   Labs (all labs ordered are listed, but only abnormal results are displayed) Labs Reviewed  CBC - Abnormal; Notable for the following components:      Result Value   Hemoglobin 11.2 (*)    HCT 34.2 (*)    All other components within normal limits  BASIC METABOLIC PANEL  TROPONIN I (HIGH SENSITIVITY)    EKG None  Radiology No results found.  Procedures Procedures    Medications Ordered in ED Medications - No data to display  ED Course/ Medical Decision Making/ A&P                                 Medical Decision Making Pt complains of pain in her chest.  Pt is concerned because she had similar symptoms in the past and had a PE.  Pt reports she is on a blood thinner but has missed dosages.    Amount and/or Complexity of Data Reviewed External Data Reviewed: notes.    Details: Primary care notes reviewed Labs: ordered. Decision-making details documented in ED Course.    Details: Labs ordered reviewed and interpreted.  Hemoglobin is 11.3 Radiology: ordered and independent interpretation performed. Decision-making details documented in ED Course.    Details: Chest x-ray shows no acute findings CT chest shows pulmonary nodules possible AV malformations. ECG/medicine tests: ordered and independent interpretation performed. Decision-making details documented in ED Course.    Details: EKG shows normal sinus no acute changes  Risk Prescription drug management. Risk Details: Patient counseled on results.  I discussed with her the necessity of taking her blood thinner.  I  suspect patient's pain is more chest wall.  Patient is given a prescription for 10 hydrocodone.  She is advised to schedule to see her primary care physician for recheck.           Final Clinical Impression(s) / ED Diagnoses Final diagnoses:  Chest pain, unspecified type  Pulmonary nodules    Rx / DC Orders ED Discharge Orders  Ordered    HYDROcodone-acetaminophen (NORCO/VICODIN) 5-325 MG tablet  Every 6 hours PRN        03/09/23 2019           An After Visit Summary was printed and given to the patient.    Elson Areas, PA-C 03/09/23 2231    Rolan Bucco, MD 03/15/23 0000

## 2023-03-09 NOTE — Discharge Instructions (Addendum)
 Schedule to see the Pulmonologist for evaluation

## 2023-03-09 NOTE — ED Notes (Signed)
 Discharge paperwork reviewed entirely with patient, including follow up care. Pain was under control. The patient received instruction and coaching on their prescriptions, and all follow-up questions were answered.  Pt verbalized understanding as well as all parties involved. No questions or concerns voiced at the time of discharge. No acute distress noted.   Pt ambulated out to PVA without incident or assistance.  Pt advised they will seek followup care with a specialist and followup with their PCP.   The pt was instructed to set up and/or review MyChart for their results; and was informed their Providers all have access to the information as well.

## 2023-03-09 NOTE — ED Triage Notes (Signed)
 Constant centralized chest pain started this am.  Non radiating, but increases with deep breath.  Some sob which is baseline for patient.  Pt takes pradaxa for history of PE.  Pt states she has been taking her medications, took it this am.

## 2023-03-12 ENCOUNTER — Ambulatory Visit (HOSPITAL_BASED_OUTPATIENT_CLINIC_OR_DEPARTMENT_OTHER): Admitting: Student

## 2023-03-12 ENCOUNTER — Ambulatory Visit (HOSPITAL_BASED_OUTPATIENT_CLINIC_OR_DEPARTMENT_OTHER)

## 2023-03-12 ENCOUNTER — Other Ambulatory Visit (HOSPITAL_BASED_OUTPATIENT_CLINIC_OR_DEPARTMENT_OTHER): Payer: Self-pay

## 2023-03-12 DIAGNOSIS — M1611 Unilateral primary osteoarthritis, right hip: Secondary | ICD-10-CM

## 2023-03-12 DIAGNOSIS — M25551 Pain in right hip: Secondary | ICD-10-CM | POA: Diagnosis not present

## 2023-03-12 DIAGNOSIS — M7061 Trochanteric bursitis, right hip: Secondary | ICD-10-CM

## 2023-03-12 DIAGNOSIS — M160 Bilateral primary osteoarthritis of hip: Secondary | ICD-10-CM | POA: Diagnosis not present

## 2023-03-12 MED ORDER — METHYLPREDNISOLONE 4 MG PO TBPK
ORAL_TABLET | ORAL | 0 refills | Status: DC
  Filled 2023-03-12: qty 21, 6d supply, fill #0

## 2023-03-12 NOTE — Progress Notes (Signed)
 Chief Complaint: Right hip pain     History of Present Illness:    Hannah Thompson is a 53 y.o. female presenting today for evaluation of lateral right hip pain after sustaining a fall yesterday.  Patient states that she passed out and woke up on the floor.  She thinks this may have been due to taking cold medicine without any food in her stomach.  Her lateral hip has been in constant pain since the fall and has not gotten any better with topical Voltaren.  She was seen by my colleague Tessa Lerner, PA-C on 1/9 and received a cortisone injection of the right greater trochanter which did give significant relief until yesterday.  Denies any groin pain, pain radiation, numbness, or tingling.   Surgical History:   None  PMH/PSH/Family History/Social History/Meds/Allergies:    Past Medical History:  Diagnosis Date   ALLERGIC RHINITIS 03/17/2009   Anemia    ASTHMA 03/17/2009   DVT (deep venous thrombosis) (HCC)    ECZEMA 03/17/2009   Headache(784.0) 06/21/2009   recurrent   HYPERLIPIDEMIA 03/17/2009   KELOID 06/21/2009   Pulmonary emboli (HCC)    VITAMIN B12 DEFICIENCY 03/18/2009   Past Surgical History:  Procedure Laterality Date   KELOID EXCISION  mid 1990's   s/p keloid removal-laser    Social History   Socioeconomic History   Marital status: Single    Spouse name: Not on file   Number of children: 3   Years of education: Not on file   Highest education level: Not on file  Occupational History   Occupation: Hair stylist  Tobacco Use   Smoking status: Former    Current packs/day: 0.00    Average packs/day: 0.3 packs/day for 4.1 years (1.0 ttl pk-yrs)    Types: Cigarettes    Start date: 03/17/2004    Quit date: 04/17/2008    Years since quitting: 14.9    Passive exposure: Never   Smokeless tobacco: Never   Tobacco comments:    quit 5 years ago  Vaping Use   Vaping status: Never Used  Substance and Sexual Activity   Alcohol use: Yes     Alcohol/week: 0.0 standard drinks of alcohol    Comment: socially   Drug use: No   Sexual activity: Not on file  Other Topics Concern   Not on file  Social History Narrative   Not on file   Social Drivers of Health   Financial Resource Strain: Not on file  Food Insecurity: No Food Insecurity (07/14/2022)   Hunger Vital Sign    Worried About Running Out of Food in the Last Year: Never true    Ran Out of Food in the Last Year: Never true  Transportation Needs: No Transportation Needs (07/14/2022)   PRAPARE - Administrator, Civil Service (Medical): No    Lack of Transportation (Non-Medical): No  Physical Activity: Not on file  Stress: Not on file  Social Connections: Not on file   Family History  Problem Relation Age of Onset   Hypertension Mother    Diabetes Mother    Cancer Father        Prostate Cancer   Hypertension Father    Eczema Sister    Allergies Sister    Angioedema Sister    Deep vein thrombosis Sister  Cancer Paternal Aunt        Ovarian Cancer   Cancer Other        Grandparent-Lung Cancer   Diabetes Other        Grandparent   Heart disease Other        Grandparent   Alcohol abuse Other        Several on both sides of family-3 uncles   Allergies  Allergen Reactions   Avocado Nausea And Vomiting    Projectile N/V   Fish Allergy Anaphylaxis and Swelling    Pollock.Swelling of lips, throat and face.   Oxaprozin     angioedema   Sulfonamide Derivatives     REACTION: hives   Latex Rash   Orange Juice [Orange Oil] Hives    Orange juice   Tomato Hives    tomatoes   Zyrtec [Cetirizine] Hives and Rash   Current Outpatient Medications  Medication Sig Dispense Refill   methylPREDNISolone (MEDROL DOSEPAK) 4 MG TBPK tablet Take per packet instructions 21 tablet 0   cromolyn (OPTICROM) 4 % ophthalmic solution Place 1 drop into both eyes 4 (four) times daily as needed (itchy/watery eyes). 10 mL 5   dabigatran (PRADAXA) 150 MG CAPS capsule  TAKE 1 CAPSULE BY MOUTH EVERY 12 HOURS 60 capsule 6   desonide (DESOWEN) 0.05 % cream Apply topically 2 (two) times daily as needed (mild rash flare). okay to use on the face, neck, groin area. Do not use more than 1 week at a time. 30 g 3   EPINEPHrine 0.3 mg/0.3 mL IJ SOAJ injection Inject 0.3 mg into the muscle as needed for anaphylaxis. 2 each 1   fluticasone (FLONASE) 50 MCG/ACT nasal spray Place 1-2 sprays into both nostrils daily as needed (nasal congestion). 16 g 5   HYDROcodone-acetaminophen (NORCO/VICODIN) 5-325 MG tablet Take 1 tablet by mouth every 6 (six) hours as needed for up to 3 days for moderate pain (pain score 4-6). 10 tablet 0   ibuprofen (ADVIL) 800 MG tablet Take 1 tablet (800 mg total) by mouth every 8 (eight) hours as needed (pain). Take with food. 30 tablet 0   levocetirizine (XYZAL) 5 MG tablet Take 1 tablet (5 mg total) by mouth 2 (two) times daily as needed for allergies (Can take an extra dose during flare ups.). 90 tablet 3   lidocaine (LIDODERM) 5 % Place 1 patch onto the skin daily. Remove & Discard patch within 12 hours 14 patch 0   methocarbamol (ROBAXIN) 500 MG tablet Take 1 tablet (500 mg total) by mouth every 8 (eight) hours as needed for muscle spasms. 30 tablet 0   montelukast (SINGULAIR) 10 MG tablet Take 1 tablet (10 mg total) by mouth at bedtime as needed (allergies). 90 tablet 3   triamcinolone cream (KENALOG) 0.1 % Apply 1 Application topically 2 (two) times daily as needed (moderate eczema). Do not use on the face, neck, armpits or groin area. Do not use more than 3 weeks in a row. 45 g 3   No current facility-administered medications for this visit.   No results found.  Review of Systems:   A ROS was performed including pertinent positives and negatives as documented in the HPI.  Physical Exam :   Constitutional: NAD and appears stated age Neurological: Alert and oriented Psych: Appropriate affect and cooperative There were no vitals taken for  this visit.   Comprehensive Musculoskeletal Exam:    Tenderness with palpation directly over the right greater trochanter.  Passive range  of motion of the right hip to 110 degrees flexion, 20 degrees external rotation, and 10 degrees internal rotation with some guarding.  5/5 strength with resisted hip abduction adduction.  Imaging:   Xray (AP pelvis, right hip 3 views): Negative for acute fracture or dislocation.  Advanced osteoarthritis of the right hip joint.   I personally reviewed and interpreted the radiographs.   Assessment:   53 y.o. female with lateral right hip pain after a fall sustained yesterday.  She does have a known history of right hip osteoarthritis and trochanteric bursitis and has received injections for both in the past.  Today her symptoms are strictly over the lateral hip without any groin pain.  X-rays show no acute abnormality.  Given this I believe her symptoms are likely a flareup of her trochanteric bursitis and although she has responded well to injection in the past, her last injection was 2 months ago therefore I do not recommend repeating this today.  I have offered a short course of an oral steroid to help with inflammation.  Patient is agreeable to this so we will start her on a Medrol Dosepak.  Will consider repeat intra-articular hip injection should this appear to be playing a larger role in her symptoms.  Plan :    -Start Medrol Dosepak and return to clinic as needed     I personally saw and evaluated the patient, and participated in the management and treatment plan.  Hazle Nordmann, PA-C Orthopedics

## 2023-03-21 ENCOUNTER — Encounter: Payer: Medicaid Other | Admitting: Radiology

## 2023-03-29 ENCOUNTER — Telehealth: Payer: Self-pay | Admitting: Internal Medicine

## 2023-03-29 DIAGNOSIS — Z01419 Encounter for gynecological examination (general) (routine) without abnormal findings: Secondary | ICD-10-CM

## 2023-03-29 NOTE — Telephone Encounter (Signed)
 Copied from CRM [id number]. Topic: Referral - Question >> Mar 29, 2023 11:38 AM Hannah Thompson wrote: Reason for CRM: Patient wanted to know if she could be referred to [facility] doctor [place], [place] in the same building before seeing the original doc that she was referred to. Please call [phone] to confirm

## 2023-03-29 NOTE — Telephone Encounter (Signed)
 Ok this is done

## 2023-04-14 DIAGNOSIS — Z419 Encounter for procedure for purposes other than remedying health state, unspecified: Secondary | ICD-10-CM | POA: Diagnosis not present

## 2023-04-18 ENCOUNTER — Encounter: Admitting: Radiology

## 2023-05-14 DIAGNOSIS — Z419 Encounter for procedure for purposes other than remedying health state, unspecified: Secondary | ICD-10-CM | POA: Diagnosis not present

## 2023-05-30 ENCOUNTER — Ambulatory Visit (INDEPENDENT_AMBULATORY_CARE_PROVIDER_SITE_OTHER): Admitting: Internal Medicine

## 2023-05-30 ENCOUNTER — Telehealth: Payer: Self-pay | Admitting: Internal Medicine

## 2023-05-30 ENCOUNTER — Encounter: Payer: Self-pay | Admitting: Internal Medicine

## 2023-05-30 VITALS — BP 122/76 | HR 63 | Temp 98.4°F | Ht 67.0 in | Wt 260.0 lb

## 2023-05-30 DIAGNOSIS — J069 Acute upper respiratory infection, unspecified: Secondary | ICD-10-CM | POA: Diagnosis not present

## 2023-05-30 DIAGNOSIS — G8929 Other chronic pain: Secondary | ICD-10-CM

## 2023-05-30 DIAGNOSIS — M545 Low back pain, unspecified: Secondary | ICD-10-CM | POA: Insufficient documentation

## 2023-05-30 DIAGNOSIS — E538 Deficiency of other specified B group vitamins: Secondary | ICD-10-CM

## 2023-05-30 DIAGNOSIS — R32 Unspecified urinary incontinence: Secondary | ICD-10-CM | POA: Diagnosis not present

## 2023-05-30 MED ORDER — DOXYCYCLINE HYCLATE 100 MG PO TABS: 100.0000 mg | ORAL_TABLET | Freq: Two times a day (BID) | ORAL | 0 refills | Status: DC

## 2023-05-30 MED ORDER — HYDROCODONE BIT-HOMATROP MBR 5-1.5 MG/5ML PO SOLN: 5.0000 mL | Freq: Four times a day (QID) | ORAL | 0 refills | Status: AC | PRN

## 2023-05-30 MED ORDER — CYCLOBENZAPRINE HCL 5 MG PO TABS: 5.0000 mg | ORAL_TABLET | Freq: Three times a day (TID) | ORAL | 1 refills | Status: AC | PRN

## 2023-05-30 NOTE — Telephone Encounter (Signed)
 Yes please ok for the 5 days

## 2023-05-30 NOTE — Assessment & Plan Note (Signed)
 Lab Results  Component Value Date   VITAMINB12 216 06/22/2022   Low, reminded to start oral replacement - b12 1000 mcg qd

## 2023-05-30 NOTE — Telephone Encounter (Signed)
 Copied from CRM [id number]. Topic: Clinical - Medication Question >> May 30, 2023 10:41 AM Baldo Levan wrote: Reason for CRM: [facility] called in stating that the doctor ordered 10 days worth of HYDROcodone  bit-homatropine (HYCODAN) 5-1.5 MG/5ML syrup [[id number]], but for the first time fill they can only fill 5 days worth. Patients pharmacy would like a call back for this approval before filling.

## 2023-05-30 NOTE — Progress Notes (Signed)
 Patient ID: SAE HANDRICH, female   DOB: [birth year], 53 y.o.   MRN: [id number]        Chief Complaint: follow up URI, left lower back pain, urinary incontinence       HPI:  Hannah Thompson is a 53 y.o. female  Here with 2-3 days acute onset fever, facial pain, pressure, headache, general weakness and malaise, and greenish d/c, with mild ST and cough, but pt denies chest pain, wheezing, increased sob or doe, orthopnea, PND, increased LE swelling, palpitations, dizziness or syncope.  Also has incidental left lower back pain mild intermittent for 1 wk without LE symptoms.  Also has several months of worsening urinary incontinence and sometimes wets before gets to BR  Denies urinary symptoms such as dysuria, frequency, urgency, flank pain, hematuria or n/v, fever, chills.       Wt Readings from Last 3 Encounters:  05/30/23 260 lb (117.9 kg)  03/09/23 265 lb (120.2 kg)  01/25/23 263 lb 0.6 oz (119.3 kg)   BP Readings from Last 3 Encounters:  05/30/23 122/76  03/09/23 (!) 176/114  02/28/23 122/80         Past Medical History:  Diagnosis Date   ALLERGIC RHINITIS 03/17/2009   Anemia    ASTHMA 03/17/2009   DVT (deep venous thrombosis) (HCC)    ECZEMA 03/17/2009   Headache(784.0) 06/21/2009   recurrent   HYPERLIPIDEMIA 03/17/2009   KELOID 06/21/2009   Pulmonary emboli (HCC)    VITAMIN B12 DEFICIENCY 03/18/2009   Past Surgical History:  Procedure Laterality Date   KELOID EXCISION  mid 1990's   s/p keloid removal-laser     reports that she quit smoking about 15 years ago. Her smoking use included cigarettes. She started smoking about 19 years ago. She has a 1 pack-year smoking history. She has never been exposed to tobacco smoke. She has never used smokeless tobacco. She reports current alcohol use. She reports that she does not use drugs. family history includes Alcohol abuse in an other family member; Allergies in her sister; Angioedema in her sister; Cancer in her father, paternal aunt, and  another family member; Deep vein thrombosis in her sister; Diabetes in her mother and another family member; Eczema in her sister; Heart disease in an other family member; Hypertension in her father and mother. Allergies  Allergen Reactions   Avocado Nausea And Vomiting    Projectile N/V   Fish Allergy Anaphylaxis and Swelling    Pollock.Swelling of lips, throat and face.   Oxaprozin     angioedema   Sulfonamide Derivatives     REACTION: hives   Latex Rash   Nsaids Rash and Dermatitis   Orange Juice [Orange Oil] Hives    Orange juice   Tomato Hives    tomatoes   Zyrtec  [Cetirizine ] Hives and Rash   Current Outpatient Medications on File Prior to Visit  Medication Sig Dispense Refill   cromolyn  (OPTICROM ) 4 % ophthalmic solution Place 1 drop into both eyes 4 (four) times daily as needed (itchy/watery eyes). 10 mL 5   dabigatran  (PRADAXA ) 150 MG CAPS capsule TAKE 1 CAPSULE BY MOUTH EVERY 12 HOURS 60 capsule 6   desonide  (DESOWEN ) 0.05 % cream Apply topically 2 (two) times daily as needed (mild rash flare). okay to use on the face, neck, groin area. Do not use more than 1 week at a time. 30 g 3   EPINEPHrine  0.3 mg/0.3 mL IJ SOAJ injection Inject 0.3 mg into the muscle as needed for  anaphylaxis. 2 each 1   fluticasone  (FLONASE ) 50 MCG/ACT nasal spray Place 1-2 sprays into both nostrils daily as needed (nasal congestion). 16 g 5   ibuprofen  (ADVIL ) 800 MG tablet Take 1 tablet (800 mg total) by mouth every 8 (eight) hours as needed (pain). Take with food. 30 tablet 0   levocetirizine (XYZAL ) 5 MG tablet Take 1 tablet (5 mg total) by mouth 2 (two) times daily as needed for allergies (Can take an extra dose during flare ups.). 90 tablet 3   lidocaine  (LIDODERM ) 5 % Place 1 patch onto the skin daily. Remove & Discard patch within 12 hours 14 patch 0   methocarbamol  (ROBAXIN ) 500 MG tablet Take 1 tablet (500 mg total) by mouth every 8 (eight) hours as needed for muscle spasms. 30 tablet 0    methylPREDNISolone  (MEDROL  DOSEPAK) 4 MG TBPK tablet Take per packet instructions 21 tablet 0   montelukast  (SINGULAIR ) 10 MG tablet Take 1 tablet (10 mg total) by mouth at bedtime as needed (allergies). 90 tablet 3   triamcinolone  cream (KENALOG ) 0.1 % Apply 1 Application topically 2 (two) times daily as needed (moderate eczema). Do not use on the face, neck, armpits or groin area. Do not use more than 3 weeks in a row. 45 g 3   No current facility-administered medications on file prior to visit.        ROS:  All others reviewed and negative.  Objective        PE:  BP 122/76 (BP Location: Left Arm, Patient Position: Sitting, Cuff Size: Normal)   Pulse 63   Temp 98.4 F (36.9 C) (Oral)   Ht 5\' 7"  (1.702 m)   Wt 260 lb (117.9 kg)   LMP  (LMP Unknown)   SpO2 99%   BMI 40.72 kg/m                 Constitutional: Pt appears in NAD               HENT: Head: NCAT.                Right Ear: External ear normal.                 Left Ear: External ear normal. Bilat tm's with mild erythema.  Max sinus areas mild tender.  Pharynx with mild erythema, no exudate               Eyes: . Pupils are equal, round, and reactive to light. Conjunctivae and EOM are normal               Nose: without d/c or deformity               Neck: Neck supple. Gross normal ROM               Cardiovascular: Normal rate and regular rhythm.                 Pulmonary/Chest: Effort normal and breath sounds without rales or wheezing.                Abd:  Soft, NT, ND, + BS, no organomegaly; spine nontender in midline, does have mild left lumbar paraspinal spasm, no rash               Neurological: Pt is alert. At baseline orientation, motor grossly intact               Skin: Skin is warm.  No rashes, no other new lesions, LE edema - none               Psychiatric: Pt behavior is normal without agitation   Micro: none  Cardiac tracings I have personally interpreted today:  none  Pertinent Radiological findings  (summarize): none   Lab Results  Component Value Date   WBC 4.5 03/09/2023   HGB 11.2 (L) 03/09/2023   HCT 34.2 (L) 03/09/2023   PLT 150 03/09/2023   GLUCOSE 93 03/09/2023   CHOL 178 06/22/2022   TRIG 173.0 (H) 06/22/2022   HDL 40.20 06/22/2022   LDLCALC 104 (H) 06/22/2022   ALT 13 01/25/2023   AST 14 (L) 01/25/2023   NA 137 03/09/2023   K 3.6 03/09/2023   CL 105 03/09/2023   CREATININE 0.68 03/09/2023   BUN 9 03/09/2023   CO2 27 03/09/2023   TSH 1.83 06/22/2022   INR 1.01 12/09/2017   HGBA1C 6.2 (A) 12/21/2022   MICROALBUR 1.6 06/22/2022   Assessment/Plan:  Hannah Thompson is a 53 y.o. Black or African American [2] female with  has a past medical history of ALLERGIC RHINITIS (03/17/2009), Anemia, ASTHMA (03/17/2009), DVT (deep venous thrombosis) (HCC), ECZEMA (03/17/2009), Headache(784.0) (06/21/2009), HYPERLIPIDEMIA (03/17/2009), KELOID (06/21/2009), Pulmonary emboli (HCC), and VITAMIN B12 DEFICIENCY (03/18/2009).  Acute upper respiratory infection Mild to mod, for doxycyline 100 bid, cough med prn,  to f/u any worsening symptoms or concerns  Lower back pain Exam c/w msk strain it appears, now for flexeril  5 tid prn   Urinary incontinence Exam benign, declines ua today, also for Urology referral  B12 deficiency Lab Results  Component Value Date   VITAMINB12 216 06/22/2022   Low, reminded to start oral replacement - b12 1000 mcg qd  Followup: Return in about 6 months (around 11/30/2023).  Rosalia Colonel, MD 05/30/2023 12:42 PM [facility]

## 2023-05-30 NOTE — Assessment & Plan Note (Signed)
 Exam benign, declines ua today, also for Urology referral

## 2023-05-30 NOTE — Assessment & Plan Note (Signed)
 Exam c/w msk strain it appears, now for flexeril  5 tid prn

## 2023-05-30 NOTE — Patient Instructions (Signed)
 Please take all new medication as prescribed - the antibiotic, cough medicine , and muscle relaxer as needed  We could not send the anti-inflammatory due to your history of allergy to NSAIDs.    Please continue all other medications as before, and refills have been done if requested.  Please have the pharmacy call with any other refills you may need.  Please keep your appointments with your specialists as you may have planned  You will be contacted regarding the referral for: Urology  Please make an Appointment to return in 6 months, or sooner if needed

## 2023-05-30 NOTE — Assessment & Plan Note (Signed)
 Mild to mod, for doxycyline 100 bid, cough med prn,  to f/u any worsening symptoms or concerns

## 2023-06-04 ENCOUNTER — Ambulatory Visit (INDEPENDENT_AMBULATORY_CARE_PROVIDER_SITE_OTHER): Admitting: Obstetrics and Gynecology

## 2023-06-04 ENCOUNTER — Other Ambulatory Visit (HOSPITAL_COMMUNITY)
Admission: RE | Admit: 2023-06-04 | Discharge: 2023-06-04 | Disposition: A | Discharge status: Home / Self Care | Payer: PRIVATE HEALTH INSURANCE | Source: Ambulatory Visit | Attending: Obstetrics and Gynecology | Admitting: Obstetrics and Gynecology

## 2023-06-04 ENCOUNTER — Encounter: Payer: Self-pay | Admitting: Obstetrics and Gynecology

## 2023-06-04 VITALS — BP 122/76 | HR 93 | Ht 67.75 in | Wt 262.0 lb

## 2023-06-04 DIAGNOSIS — Z01419 Encounter for gynecological examination (general) (routine) without abnormal findings: Secondary | ICD-10-CM | POA: Insufficient documentation

## 2023-06-04 DIAGNOSIS — Z124 Encounter for screening for malignant neoplasm of cervix: Secondary | ICD-10-CM | POA: Diagnosis not present

## 2023-06-04 DIAGNOSIS — R3915 Urgency of urination: Secondary | ICD-10-CM

## 2023-06-04 DIAGNOSIS — B9689 Other specified bacterial agents as the cause of diseases classified elsewhere: Secondary | ICD-10-CM

## 2023-06-04 DIAGNOSIS — Z1331 Encounter for screening for depression: Secondary | ICD-10-CM

## 2023-06-04 DIAGNOSIS — N760 Acute vaginitis: Secondary | ICD-10-CM | POA: Diagnosis not present

## 2023-06-04 DIAGNOSIS — N814 Uterovaginal prolapse, unspecified: Secondary | ICD-10-CM | POA: Insufficient documentation

## 2023-06-04 LAB — WET PREP FOR TRICH, YEAST, CLUE

## 2023-06-04 MED ORDER — METRONIDAZOLE 500 MG PO TABS: 500.0000 mg | ORAL_TABLET | Freq: Two times a day (BID) | ORAL | 0 refills | Status: AC

## 2023-06-04 NOTE — Patient Instructions (Signed)

## 2023-06-04 NOTE — Assessment & Plan Note (Signed)
 Cervical cancer screening performed according to ASCCP guidelines. Encouraged annual mammogram screening- encouraged Colonoscopy referral placed by PCP DXA N/A Labs and immunizations with her primary Encouraged safe sexual practices as indicated Encouraged healthy lifestyle practices with diet and exercise For patients under 50-52yo, I recommend 1200mg  calcium daily and 600IU of vitamin D  daily.

## 2023-06-04 NOTE — Progress Notes (Signed)
 53 y.o. [id number] female with hx of recurrent DVT/PE here for annual exam. 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  No LMP recorded (lmp unknown). Patient is postmenopausal. Last period 2023-2024.   Lost to GYN care for years. Fm hx of bladder cancer- older sister passed. Also list 2 brothers. She reports urine urgency. No leakage. Nocturia 3x.  Drinks predominantly 40oz soda, 20oz lemonade, occasional red bull. Sips ice water 20-32oz.  Abnormal bleeding: none Pelvic discharge or pain: none Breast mass, nipple discharge or skin changes : none  Last PAP: No results found for: "DIAGPAP", "HPVHIGH", "ADEQPAP" Last mammogram: 2014 Last colonoscopy: never Sexually active: no  Exercising: no Smoker: no  Garment/textile technologist Visit from 06/04/2023 in [facility]  PHQ-2 Total Score 0       Flowsheet Row Office Visit from 11/30/2021 in [facility] [place]  PHQ-9 Total Score 0       GYN HISTORY: No significant history  OB History  Gravida Para Term Preterm AB Living  5 3 3  2 3   SAB IAB Ectopic Multiple Live Births  1 1       # Outcome Date GA Lbr Len/2nd Weight Sex Type Anes PTL Lv  5 IAB           4 SAB           3 Term           2 Term           1 Term            Past Medical History:  Diagnosis Date   ALLERGIC RHINITIS 03/17/2009   Anemia    ASTHMA 03/17/2009   DVT (deep venous thrombosis) (HCC)    ECZEMA 03/17/2009   Headache(784.0) 06/21/2009   recurrent   HYPERLIPIDEMIA 03/17/2009   KELOID 06/21/2009   Pulmonary emboli (HCC)    VITAMIN B12 DEFICIENCY 03/18/2009   Past Surgical History:  Procedure Laterality Date   KELOID EXCISION  mid 1990's   s/p keloid removal-laser    Current Outpatient Medications on File Prior to Visit  Medication Sig Dispense Refill   cromolyn  (OPTICROM ) 4 % ophthalmic solution Place 1 drop into both eyes 4 (four) times daily as needed (itchy/watery eyes). 10 mL 5   cyclobenzaprine   (FLEXERIL ) 5 MG tablet Take 1 tablet (5 mg total) by mouth 3 (three) times daily as needed. 40 tablet 1   dabigatran  (PRADAXA ) 150 MG CAPS capsule TAKE 1 CAPSULE BY MOUTH EVERY 12 HOURS 60 capsule 6   desonide  (DESOWEN ) 0.05 % cream Apply topically 2 (two) times daily as needed (mild rash flare). okay to use on the face, neck, groin area. Do not use more than 1 week at a time. 30 g 3   doxycycline  (VIBRA -TABS) 100 MG tablet Take 1 tablet (100 mg total) by mouth 2 (two) times daily. 20 tablet 0   EPINEPHrine  0.3 mg/0.3 mL IJ SOAJ injection Inject 0.3 mg into the muscle as needed for anaphylaxis. 2 each 1   fluticasone  (FLONASE ) 50 MCG/ACT nasal spray Place 1-2 sprays into both nostrils daily as needed (nasal congestion). 16 g 5   HYDROcodone  bit-homatropine (HYCODAN) 5-1.5 MG/5ML syrup Take 5 mLs by mouth every 6 (six) hours as needed for up to 10 days. 180 mL 0   ibuprofen  (ADVIL ) 800 MG tablet Take 1 tablet (800 mg total) by mouth every 8 (eight) hours as needed (pain). Take with food. 30  tablet 0   levocetirizine (XYZAL ) 5 MG tablet Take 1 tablet (5 mg total) by mouth 2 (two) times daily as needed for allergies (Can take an extra dose during flare ups.). 90 tablet 3   methocarbamol  (ROBAXIN ) 500 MG tablet Take 1 tablet (500 mg total) by mouth every 8 (eight) hours as needed for muscle spasms. 30 tablet 0   montelukast  (SINGULAIR ) 10 MG tablet Take 1 tablet (10 mg total) by mouth at bedtime as needed (allergies). 90 tablet 3   triamcinolone  cream (KENALOG ) 0.1 % Apply 1 Application topically 2 (two) times daily as needed (moderate eczema). Do not use on the face, neck, armpits or groin area. Do not use more than 3 weeks in a row. 45 g 3   No current facility-administered medications on file prior to visit.   Social History   Socioeconomic History   Marital status: Single    Spouse name: Not on file   Number of children: 3   Years of education: Not on file   Highest education level: Not on file   Occupational History   Occupation: Hair stylist  Tobacco Use   Smoking status: Former    Current packs/day: 0.00    Average packs/day: 0.3 packs/day for 4.1 years (1.0 ttl pk-yrs)    Types: Cigarettes    Start date: 03/17/2004    Quit date: 04/17/2008    Years since quitting: 15.1    Passive exposure: Never   Smokeless tobacco: Never   Tobacco comments:    quit 5 years ago  Vaping Use   Vaping status: Never Used  Substance and Sexual Activity   Alcohol use: Yes    Alcohol/week: 0.0 standard drinks of alcohol    Comment: socially   Drug use: No   Sexual activity: Not Currently    Birth control/protection: None  Other Topics Concern   Not on file  Social History Narrative   Not on file   Social Drivers of Health   Financial Resource Strain: Not on file  Food Insecurity: No Food Insecurity (07/14/2022)   Hunger Vital Sign    Worried About Running Out of Food in the Last Year: Never true    Ran Out of Food in the Last Year: Never true  Transportation Needs: No Transportation Needs (07/14/2022)   PRAPARE - Administrator, Civil Service (Medical): No    Lack of Transportation (Non-Medical): No  Physical Activity: Not on file  Stress: Not on file  Social Connections: Not on file  Intimate Partner Violence: Not At Risk (07/14/2022)   Humiliation, Afraid, Rape, and Kick questionnaire    Fear of Current or Ex-Partner: No    Emotionally Abused: No    Physically Abused: No    Sexually Abused: No   Family History  Problem Relation Age of Onset   Hypertension Mother    Diabetes Mother    Cancer Father        Prostate Cancer   Hypertension Father    Eczema Sister    Allergies Sister    Angioedema Sister    Deep vein thrombosis Sister    Cancer Paternal Aunt        Ovarian Cancer   Cancer Other        Grandparent-Lung Cancer   Diabetes Other        Grandparent   Heart disease Other        Grandparent   Alcohol abuse Other        Several  on both sides of  family-3 uncles   Allergies  Allergen Reactions   Avocado Nausea And Vomiting    Projectile N/V   Fish Allergy Anaphylaxis and Swelling    Pollock.Swelling of lips, throat and face.   Oxaprozin     angioedema   Sulfonamide Derivatives     REACTION: hives   Latex Rash   Nsaids Rash and Dermatitis   Orange Juice [Orange Oil] Hives    Orange juice   Tomato Hives    tomatoes   Zyrtec  [Cetirizine ] Hives and Rash     PE Today's Vitals   06/04/23 1451  BP: 122/76  Pulse: 93  SpO2: 97%  Weight: 262 lb (118.8 kg)  Height: 5' 7.75" (1.721 m)   Body mass index is 40.13 kg/m.  Physical Exam Vitals reviewed. Exam conducted with a chaperone present.  Constitutional:      General: She is not in acute distress.    Appearance: Normal appearance.  HENT:     Head: Normocephalic and atraumatic.     Nose: Nose normal.  Eyes:     Extraocular Movements: Extraocular movements intact.     Conjunctiva/sclera: Conjunctivae normal.  Neck:     Thyroid : No thyroid  mass, thyromegaly or thyroid  tenderness.  Pulmonary:     Effort: Pulmonary effort is normal.  Chest:     Chest wall: No mass or tenderness.  Breasts:    Right: Normal. No swelling, mass, nipple discharge, skin change or tenderness.     Left: Normal. No swelling, mass, nipple discharge, skin change or tenderness.  Abdominal:     General: There is no distension.     Palpations: Abdomen is soft.     Tenderness: There is no abdominal tenderness.  Genitourinary:    General: Normal vulva.     Exam position: Lithotomy position.     Urethra: No prolapse.     Vagina: Vaginal discharge and prolapsed vaginal walls (grade 2) present. No bleeding.     Cervix: Normal. No lesion.     Uterus: Normal. Not enlarged and not tender.      Adnexa: Right adnexa normal and left adnexa normal.     Comments: Uncoordinated kegel Musculoskeletal:        General: Normal range of motion.     Cervical back: Normal range of motion.  Lymphadenopathy:      Upper Body:     Right upper body: No axillary adenopathy.     Left upper body: No axillary adenopathy.     Lower Body: No right inguinal adenopathy. No left inguinal adenopathy.  Skin:    General: Skin is warm and dry.  Neurological:     General: No focal deficit present.     Mental Status: She is alert.  Psychiatric:        Mood and Affect: Mood normal.        Behavior: Behavior normal.       Assessment and Plan:        Well woman exam with routine gynecological exam Assessment & Plan: Cervical cancer screening performed according to ASCCP guidelines. Encouraged annual mammogram screening- encouraged Colonoscopy referral placed by PCP DXA N/A Labs and immunizations with her primary Encouraged safe sexual practices as indicated Encouraged healthy lifestyle practices with diet and exercise For patients under 50-70yo, I recommend 1200mg  calcium daily and 600IU of vitamin D  daily.    Urinary urgency -     Urinalysis,Complete w/RFL Culture  Cervical cancer screening -  Cytology - PAP  BV (bacterial vaginosis) -     metroNIDAZOLE; Take 1 tablet (500 mg total) by mouth 2 (two) times daily for 7 days.  Dispense: 14 tablet; Refill: 0 -     WET PREP FOR TRICH, YEAST, CLUE  Cystocele with prolapse  Reviewed kegel exercises.  Romaine Closs, MD

## 2023-06-05 ENCOUNTER — Ambulatory Visit: Payer: Self-pay | Admitting: Obstetrics and Gynecology

## 2023-06-06 LAB — URINALYSIS, COMPLETE W/RFL CULTURE
Bilirubin Urine: NEGATIVE
Glucose, UA: NEGATIVE
Hgb urine dipstick: NEGATIVE
Hyaline Cast: NONE SEEN /LPF
Nitrites, Initial: NEGATIVE
RBC / HPF: NONE SEEN /HPF (ref 0–2)
Specific Gravity, Urine: 1.025 (ref 1.001–1.035)
pH: 6 (ref 5.0–8.0)

## 2023-06-06 LAB — URINE CULTURE
MICRO NUMBER:: 16526311
Result:: NO GROWTH
SPECIMEN QUALITY:: ADEQUATE

## 2023-06-06 LAB — CYTOLOGY - PAP
Comment: NEGATIVE
Diagnosis: NEGATIVE
High risk HPV: NEGATIVE

## 2023-06-06 LAB — CULTURE INDICATED

## 2023-06-14 DIAGNOSIS — Z419 Encounter for procedure for purposes other than remedying health state, unspecified: Secondary | ICD-10-CM | POA: Diagnosis not present

## 2023-06-28 ENCOUNTER — Ambulatory Visit: Payer: Medicaid Other | Admitting: Internal Medicine

## 2023-07-14 DIAGNOSIS — Z419 Encounter for procedure for purposes other than remedying health state, unspecified: Secondary | ICD-10-CM | POA: Diagnosis not present

## 2023-07-26 ENCOUNTER — Other Ambulatory Visit: Payer: Self-pay | Admitting: Medical Oncology

## 2023-07-26 ENCOUNTER — Inpatient Hospital Stay: Payer: PRIVATE HEALTH INSURANCE | Admitting: Medical Oncology

## 2023-07-26 ENCOUNTER — Inpatient Hospital Stay: Payer: PRIVATE HEALTH INSURANCE | Attending: Internal Medicine

## 2023-07-26 DIAGNOSIS — I2699 Other pulmonary embolism without acute cor pulmonale: Secondary | ICD-10-CM

## 2023-07-26 DIAGNOSIS — I2692 Saddle embolus of pulmonary artery without acute cor pulmonale: Secondary | ICD-10-CM

## 2023-07-26 DIAGNOSIS — I82491 Acute embolism and thrombosis of other specified deep vein of right lower extremity: Secondary | ICD-10-CM

## 2023-07-26 DIAGNOSIS — Z7901 Long term (current) use of anticoagulants: Secondary | ICD-10-CM

## 2023-08-14 DIAGNOSIS — Z419 Encounter for procedure for purposes other than remedying health state, unspecified: Secondary | ICD-10-CM | POA: Diagnosis not present

## 2023-08-30 LAB — HM DIABETES EYE EXAM

## 2023-09-14 DIAGNOSIS — Z419 Encounter for procedure for purposes other than remedying health state, unspecified: Secondary | ICD-10-CM | POA: Diagnosis not present

## 2023-10-01 ENCOUNTER — Ambulatory Visit (INDEPENDENT_AMBULATORY_CARE_PROVIDER_SITE_OTHER): Admitting: Student

## 2023-10-01 ENCOUNTER — Ambulatory Visit (INDEPENDENT_AMBULATORY_CARE_PROVIDER_SITE_OTHER)

## 2023-10-01 DIAGNOSIS — M25562 Pain in left knee: Secondary | ICD-10-CM | POA: Diagnosis not present

## 2023-10-01 DIAGNOSIS — S83105A Unspecified dislocation of left knee, initial encounter: Secondary | ICD-10-CM | POA: Diagnosis not present

## 2023-10-01 DIAGNOSIS — G8929 Other chronic pain: Secondary | ICD-10-CM | POA: Diagnosis not present

## 2023-10-01 MED ORDER — LIDOCAINE HCL 1 % IJ SOLN
6.0000 mL | INTRAMUSCULAR | Status: AC | PRN
  Administered 2023-10-01: 6 mL

## 2023-10-01 MED ORDER — TRIAMCINOLONE ACETONIDE 40 MG/ML IJ SUSP
2.0000 mL | INTRAMUSCULAR | Status: AC | PRN
  Administered 2023-10-01: 2 mL via INTRA_ARTICULAR

## 2023-10-01 NOTE — Progress Notes (Signed)
 Chief Complaint: Left knee pain    Discussed the use of AI scribe software for clinical note transcription with the patient, who gave verbal consent to proceed.  History of Present Illness Hannah Thompson is a 53 year old female with osteoarthritis who presents with left knee pain and swelling after a fall.  She experienced left knee pain and swelling after her youngest son fell into her knee on Thursday. The swelling has decreased but the knee remains tender, especially on one side, and pain is noticeable when walking. On Friday, the entire leg was swollen with fluid accumulation in the knee. She works as a Interior and spatial designer, requiring prolonged standing, which exacerbates the discomfort. Her left knee was x-rayed in 2022. She recalls a past dislocation of the right knee in high school and a possible ligament injury in 2000. She has had injections for knee issues in the past, which provided relief, and has not had major problems since the last injection until now.   Surgical History:   None of left knee  PMH/PSH/Family History/Social History/Meds/Allergies:    Past Medical History:  Diagnosis Date   ALLERGIC RHINITIS 03/17/2009   Anemia    ASTHMA 03/17/2009   DVT (deep venous thrombosis) (HCC)    ECZEMA 03/17/2009   Headache(784.0) 06/21/2009   recurrent   HYPERLIPIDEMIA 03/17/2009   KELOID 06/21/2009   Pulmonary emboli (HCC)    VITAMIN B12 DEFICIENCY 03/18/2009   Past Surgical History:  Procedure Laterality Date   KELOID EXCISION  mid 1990's   s/p keloid removal-laser    Social History   Socioeconomic History   Marital status: Single    Spouse name: Not on file   Number of children: 3   Years of education: Not on file   Highest education level: Not on file  Occupational History   Occupation: Hair stylist  Tobacco Use   Smoking status: Former    Current packs/day: 0.00    Average packs/day: 0.3 packs/day for 4.1 years (1.0 ttl pk-yrs)    Types:  Cigarettes    Start date: 03/17/2004    Quit date: 04/17/2008    Years since quitting: 15.4    Passive exposure: Never   Smokeless tobacco: Never   Tobacco comments:    quit 5 years ago  Vaping Use   Vaping status: Never Used  Substance and Sexual Activity   Alcohol use: Yes    Alcohol/week: 0.0 standard drinks of alcohol    Comment: socially   Drug use: No   Sexual activity: Not Currently    Birth control/protection: None  Other Topics Concern   Not on file  Social History Narrative   Not on file   Social Drivers of Health   Financial Resource Strain: Not on file  Food Insecurity: No Food Insecurity (07/14/2022)   Hunger Vital Sign    Worried About Running Out of Food in the Last Year: Never true    Ran Out of Food in the Last Year: Never true  Transportation Needs: No Transportation Needs (07/14/2022)   PRAPARE - Administrator, Civil Service (Medical): No    Lack of Transportation (Non-Medical): No  Physical Activity: Not on file  Stress: Not on file  Social Connections: Not on file   Family History  Problem Relation Age of Onset  Hypertension Mother    Diabetes Mother    Cancer Father        Prostate Cancer   Hypertension Father    Eczema Sister    Allergies Sister    Angioedema Sister    Deep vein thrombosis Sister    Cancer Paternal Aunt        Ovarian Cancer   Cancer Other        Grandparent-Lung Cancer   Diabetes Other        Grandparent   Heart disease Other        Grandparent   Alcohol abuse Other        Several on both sides of family-3 uncles   Allergies  Allergen Reactions   Avocado Nausea And Vomiting    Projectile N/V   Fish Allergy Anaphylaxis and Swelling    Pollock.Swelling of lips, throat and face.   Oxaprozin     angioedema   Sulfonamide Derivatives     REACTION: hives   Latex Rash   Nsaids Rash and Dermatitis   Orange Juice [Orange Oil] Hives    Orange juice   Tomato Hives    tomatoes   Zyrtec  [Cetirizine ] Hives  and Rash   Current Outpatient Medications  Medication Sig Dispense Refill   cromolyn  (OPTICROM ) 4 % ophthalmic solution Place 1 drop into both eyes 4 (four) times daily as needed (itchy/watery eyes). 10 mL 5   cyclobenzaprine  (FLEXERIL ) 5 MG tablet Take 1 tablet (5 mg total) by mouth 3 (three) times daily as needed. 40 tablet 1   dabigatran  (PRADAXA ) 150 MG CAPS capsule TAKE 1 CAPSULE BY MOUTH EVERY 12 HOURS 60 capsule 6   desonide  (DESOWEN ) 0.05 % cream Apply topically 2 (two) times daily as needed (mild rash flare). okay to use on the face, neck, groin area. Do not use more than 1 week at a time. 30 g 3   doxycycline  (VIBRA -TABS) 100 MG tablet Take 1 tablet (100 mg total) by mouth 2 (two) times daily. 20 tablet 0   EPINEPHrine  0.3 mg/0.3 mL IJ SOAJ injection Inject 0.3 mg into the muscle as needed for anaphylaxis. 2 each 1   fluticasone  (FLONASE ) 50 MCG/ACT nasal spray Place 1-2 sprays into both nostrils daily as needed (nasal congestion). 16 g 5   ibuprofen  (ADVIL ) 800 MG tablet Take 1 tablet (800 mg total) by mouth every 8 (eight) hours as needed (pain). Take with food. 30 tablet 0   levocetirizine (XYZAL ) 5 MG tablet Take 1 tablet (5 mg total) by mouth 2 (two) times daily as needed for allergies (Can take an extra dose during flare ups.). 90 tablet 3   methocarbamol  (ROBAXIN ) 500 MG tablet Take 1 tablet (500 mg total) by mouth every 8 (eight) hours as needed for muscle spasms. 30 tablet 0   montelukast  (SINGULAIR ) 10 MG tablet Take 1 tablet (10 mg total) by mouth at bedtime as needed (allergies). 90 tablet 3   triamcinolone  cream (KENALOG ) 0.1 % Apply 1 Application topically 2 (two) times daily as needed (moderate eczema). Do not use on the face, neck, armpits or groin area. Do not use more than 3 weeks in a row. 45 g 3   No current facility-administered medications for this visit.   No results found.  Review of Systems:   A ROS was performed including pertinent positives and negatives as  documented in the HPI.  Physical Exam :   Constitutional: NAD and appears stated age Neurological: Alert and oriented Psych: Appropriate  affect and cooperative There were no vitals taken for this visit.   Comprehensive Musculoskeletal Exam:    Exam of the left knee demonstrates active range of motion from 0 to 100 degrees.  Presence of a mild to moderate joint effusion without overlying erythema or warmth.  Patient is ambulating with antalgic gait.  Tenderness over the lateral joint line.  Imaging:   Xray (left knee 4 views): Moderate tricompartmental osteoarthritis with diffuse osteophytes.  There is significant medial joint space narrowing within the medial compartment as well as lateral subluxation of the tibia in relation to the femur.   I personally reviewed and interpreted the radiographs.      Assessment & Plan Left knee osteoarthritis with acute effusion and pain   Left knee osteoarthritis with acute effusion and pain following a fall. Persistent pain during ambulation. X-rays reveal arthritis with joint space narrowing and bone spurs without fracture. Ultrasound confirms effusion.  Aspiration and injection discussed with patient who is agreeable to proceed.  Aspiration successful for 25 mL of bloody fluid followed by cortisone injection. Advise to return if symptoms persist or worsen.     Procedure Note  Patient: Hannah Thompson             Date of Birth: 1970-12-29           MRN: [id number]             Visit Date: 10/01/2023  Procedures: Visit Diagnoses:  1. Chronic pain of left knee     Large Joint Inj: L knee on 10/01/2023 5:31 PM Indications: pain Details: 18 G 1.5 in needle, superolateral approach Medications: 6 mL lidocaine  1 %; 2 mL triamcinolone  acetonide 40 MG/ML Aspirate: 25 mL bloody Outcome: tolerated well, no immediate complications Procedure, treatment alternatives, risks and benefits explained, specific risks discussed. Consent was given by the  patient. Immediately prior to procedure a time out was called to verify the correct patient, procedure, equipment, support staff and site/side marked as required. Patient was prepped and draped in the usual sterile fashion.        I personally saw and evaluated the patient, and participated in the management and treatment plan.  Leonce Reveal, PA-C Orthopedics

## 2023-11-05 ENCOUNTER — Encounter: Payer: Self-pay | Admitting: Radiology

## 2023-11-14 DIAGNOSIS — Z419 Encounter for procedure for purposes other than remedying health state, unspecified: Secondary | ICD-10-CM | POA: Diagnosis not present

## 2023-11-19 ENCOUNTER — Inpatient Hospital Stay
Admission: RE | Admit: 2023-11-19 | Discharge: 2023-11-19 | Disposition: A | Discharge status: Home / Self Care | Source: Ambulatory Visit | Attending: Internal Medicine | Admitting: Internal Medicine

## 2023-11-19 ENCOUNTER — Other Ambulatory Visit: Payer: Self-pay | Admitting: Internal Medicine

## 2023-11-19 DIAGNOSIS — Z1231 Encounter for screening mammogram for malignant neoplasm of breast: Secondary | ICD-10-CM

## 2023-11-20 ENCOUNTER — Ambulatory Visit: Admitting: Internal Medicine

## 2023-11-20 VITALS — BP 120/70 | HR 55 | Temp 98.1°F | Ht 67.75 in | Wt 264.0 lb

## 2023-11-20 DIAGNOSIS — I10 Essential (primary) hypertension: Secondary | ICD-10-CM

## 2023-11-20 DIAGNOSIS — L2089 Other atopic dermatitis: Secondary | ICD-10-CM

## 2023-11-20 DIAGNOSIS — H1010 Acute atopic conjunctivitis, unspecified eye: Secondary | ICD-10-CM | POA: Diagnosis not present

## 2023-11-20 DIAGNOSIS — J3089 Other allergic rhinitis: Secondary | ICD-10-CM | POA: Diagnosis not present

## 2023-11-20 DIAGNOSIS — E66813 Obesity, class 3: Secondary | ICD-10-CM | POA: Diagnosis not present

## 2023-11-20 DIAGNOSIS — E559 Vitamin D deficiency, unspecified: Secondary | ICD-10-CM

## 2023-11-20 DIAGNOSIS — R21 Rash and other nonspecific skin eruption: Secondary | ICD-10-CM | POA: Diagnosis not present

## 2023-11-20 DIAGNOSIS — J302 Other seasonal allergic rhinitis: Secondary | ICD-10-CM | POA: Diagnosis not present

## 2023-11-20 DIAGNOSIS — Z6841 Body Mass Index (BMI) 40.0 and over, adult: Secondary | ICD-10-CM

## 2023-11-20 DIAGNOSIS — J4520 Mild intermittent asthma, uncomplicated: Secondary | ICD-10-CM | POA: Diagnosis not present

## 2023-11-20 MED ORDER — PROMETHAZINE-DM 6.25-15 MG/5ML PO SYRP: 5.0000 mL | ORAL_SOLUTION | Freq: Four times a day (QID) | ORAL | 0 refills | Status: AC | PRN

## 2023-11-20 MED ORDER — PREDNISONE 10 MG PO TABS: ORAL_TABLET | ORAL | 0 refills | Status: AC

## 2023-11-20 MED ORDER — PHENTERMINE HCL 30 MG PO CAPS: 30.0000 mg | ORAL_CAPSULE | ORAL | 1 refills | Status: AC

## 2023-11-20 MED ORDER — METHYLPREDNISOLONE ACETATE 80 MG/ML IJ SUSP
80.0000 mg | Freq: Once | INTRAMUSCULAR | Status: AC
  Administered 2023-11-20: 80 mg via INTRAMUSCULAR

## 2023-11-20 MED ORDER — TRIAMCINOLONE ACETONIDE 0.1 % EX CREA: 1.0000 | TOPICAL_CREAM | Freq: Two times a day (BID) | CUTANEOUS | 1 refills | Status: AC

## 2023-11-20 MED ORDER — FEXOFENADINE HCL 180 MG PO TABS: 180.0000 mg | ORAL_TABLET | Freq: Every day | ORAL | 2 refills | Status: AC

## 2023-11-20 MED ORDER — AZELASTINE HCL 0.1 % NA SOLN: 1.0000 | Freq: Two times a day (BID) | NASAL | 12 refills | Status: AC

## 2023-11-20 NOTE — Assessment & Plan Note (Signed)
 Uncontrolled with severe flare - for depomedrol 80 mg IM, prednisone  taper, and start allegra  180 every day prn, and astelin nasal spray, consider allergy referral

## 2023-11-20 NOTE — Assessment & Plan Note (Signed)
O/w stable, cont inhaler prn

## 2023-11-20 NOTE — Patient Instructions (Signed)
 You had the steroid shot today  Please take all new medication as prescribed - the prednisone , topical steroid cream as needed, allegra  180 mg per day, and Astelin nasal spray  Please take all new medication as prescribed- the phentermine  for wt loss  Please continue all other medications as before, and refills have been done if requested.  Please have the pharmacy call with any other refills you may need.  Please keep your appointments with your specialists as you may have planned

## 2023-11-20 NOTE — Assessment & Plan Note (Signed)
 Uncontrolled, also for triam cr prn asd

## 2023-11-20 NOTE — Assessment & Plan Note (Signed)
 Pt ok for phentermine  30 mg every day limited rx

## 2023-11-20 NOTE — Progress Notes (Signed)
 Patient ID: Hannah Thompson, female   DOB: Dec 24, 1970, 53 y.o.   MRN: [id number]        Chief Complaint: follow up allergies, facial rash, obesity, low vit d, htn       HPI:  Hannah Thompson is a 53 y.o. female here with c/o 1 wk onset persistent sinus congestion with post nasal gtt and sometimes severe cough with vomiting, no nausea.  Pt denies chest pain, increased sob or doe, wheezing, orthopnea, PND, increased LE swelling, palpitations, dizziness or syncope. . Pt denies polydipsia, polyuria, or new focal neuro s/s.  But does has also dermatitis non tender large area indurated rash to left max sinus area. Pt unable to lose wt with diet, exercise.         Wt Readings from Last 3 Encounters:  11/20/23 264 lb (119.7 kg)  06/04/23 262 lb (118.8 kg)  05/30/23 260 lb (117.9 kg)   BP Readings from Last 3 Encounters:  11/20/23 120/70  06/04/23 122/76  05/30/23 122/76         Past Medical History:  Diagnosis Date   ALLERGIC RHINITIS 03/17/2009   Anemia    ASTHMA 03/17/2009   DVT (deep venous thrombosis) (HCC)    ECZEMA 03/17/2009   Headache(784.0) 06/21/2009   recurrent   HYPERLIPIDEMIA 03/17/2009   KELOID 06/21/2009   Pulmonary emboli (HCC)    VITAMIN B12 DEFICIENCY 03/18/2009   Past Surgical History:  Procedure Laterality Date   KELOID EXCISION  mid 1990's   s/p keloid removal-laser     reports that she quit smoking about 15 years ago. Her smoking use included cigarettes. She started smoking about 19 years ago. She has a 1 pack-year smoking history. She has never been exposed to tobacco smoke. She has never used smokeless tobacco. She reports current alcohol use. She reports that she does not use drugs. family history includes Alcohol abuse in an other family member; Allergies in her sister; Angioedema in her sister; Cancer in her father, paternal aunt, and another family member; Deep vein thrombosis in her sister; Diabetes in her mother and another family member; Eczema in her sister; Heart  disease in an other family member; Hypertension in her father and mother. Allergies  Allergen Reactions   Avocado Nausea And Vomiting    Projectile N/V   Fish Allergy Anaphylaxis and Swelling    Pollock.Swelling of lips, throat and face.   Oxaprozin     angioedema   Sulfonamide Derivatives     REACTION: hives   Latex Rash   Nsaids Rash and Dermatitis   Orange Juice [Orange Oil] Hives    Orange juice   Tomato Hives    tomatoes   Zyrtec  [Cetirizine ] Hives and Rash   Current Outpatient Medications on File Prior to Visit  Medication Sig Dispense Refill   cromolyn  (OPTICROM ) 4 % ophthalmic solution Place 1 drop into both eyes 4 (four) times daily as needed (itchy/watery eyes). 10 mL 5   cyclobenzaprine  (FLEXERIL ) 5 MG tablet Take 1 tablet (5 mg total) by mouth 3 (three) times daily as needed. 40 tablet 1   dabigatran  (PRADAXA ) 150 MG CAPS capsule TAKE 1 CAPSULE BY MOUTH EVERY 12 HOURS 60 capsule 6   desonide  (DESOWEN ) 0.05 % cream Apply topically 2 (two) times daily as needed (mild rash flare). okay to use on the face, neck, groin area. Do not use more than 1 week at a time. 30 g 3   EPINEPHrine  0.3 mg/0.3 mL IJ SOAJ injection Inject  0.3 mg into the muscle as needed for anaphylaxis. 2 each 1   fluticasone  (FLONASE ) 50 MCG/ACT nasal spray Place 1-2 sprays into both nostrils daily as needed (nasal congestion). 16 g 5   ibuprofen  (ADVIL ) 800 MG tablet Take 1 tablet (800 mg total) by mouth every 8 (eight) hours as needed (pain). Take with food. 30 tablet 0   levocetirizine (XYZAL ) 5 MG tablet Take 1 tablet (5 mg total) by mouth 2 (two) times daily as needed for allergies (Can take an extra dose during flare ups.). 90 tablet 3   methocarbamol  (ROBAXIN ) 500 MG tablet Take 1 tablet (500 mg total) by mouth every 8 (eight) hours as needed for muscle spasms. 30 tablet 0   montelukast  (SINGULAIR ) 10 MG tablet Take 1 tablet (10 mg total) by mouth at bedtime as needed (allergies). 90 tablet 3   No  current facility-administered medications on file prior to visit.        ROS:  All others reviewed and negative.  Objective        PE:  BP 120/70 (BP Location: Right Arm, Patient Position: Sitting, Cuff Size: Normal)   Pulse (!) 55   Temp 98.1 F (36.7 C) (Oral)   Ht 5' 7.75 (1.721 m)   Wt 264 lb (119.7 kg)   LMP  (LMP Unknown)   SpO2 97%   BMI 40.44 kg/m                 Constitutional: Pt appears fatigued, non toxic               HENT: Head: NCAT.                Right Ear: External ear normal.                 Left Ear: External ear normal. Bilat tm's with mild erythema.  Max sinus areas mild tender.  Pharynx with mild erythema, no exudate               Eyes: . Pupils are equal, round, and reactive to light. Conjunctivae and EOM are normal               Nose: without d/c or deformity               Neck: Neck supple. Gross normal ROM               Cardiovascular: Normal rate and regular rhythm.                 Pulmonary/Chest: Effort normal and breath sounds without rales or wheezing.                Abd:  Soft, NT, ND, + BS, no organomegaly               Neurological: Pt is alert. At baseline orientation, motor grossly intact               Skin: Skin is warm. Left maxillary sinus area rash nontender erythema somewhat indurated, no other new lesions, LE edema - none               Psychiatric: Pt behavior is normal without agitation   Micro: none  Cardiac tracings I have personally interpreted today:  none  Pertinent Radiological findings (summarize): none   Lab Results  Component Value Date   WBC 4.5 03/09/2023   HGB 11.2 (L) 03/09/2023   HCT 34.2 (L) 03/09/2023  PLT 150 03/09/2023   GLUCOSE 93 03/09/2023   CHOL 178 06/22/2022   TRIG 173.0 (H) 06/22/2022   HDL 40.20 06/22/2022   LDLCALC 104 (H) 06/22/2022   ALT 13 01/25/2023   AST 14 (L) 01/25/2023   NA 137 03/09/2023   K 3.6 03/09/2023   CL 105 03/09/2023   CREATININE 0.68 03/09/2023   BUN 9 03/09/2023   CO2 27  03/09/2023   TSH 1.83 06/22/2022   INR 1.01 12/09/2017   HGBA1C 6.2 (A) 12/21/2022   Assessment/Plan:  Hannah Thompson is a 53 y.o. Black or African American [2] female with  has a past medical history of ALLERGIC RHINITIS (03/17/2009), Anemia, ASTHMA (03/17/2009), DVT (deep venous thrombosis) (HCC), ECZEMA (03/17/2009), Headache(784.0) (06/21/2009), HYPERLIPIDEMIA (03/17/2009), KELOID (06/21/2009), Pulmonary emboli (HCC), and VITAMIN B12 DEFICIENCY (03/18/2009).  Vitamin D  deficiency Last vitamin D  Lab Results  Component Value Date   VD25OH 9.00 (L) 06/22/2022   Low, to start oral replacement   Seasonal and perennial allergic rhinoconjunctivitis Uncontrolled with severe flare - for depomedrol 80 mg IM, prednisone  taper, and start allegra  180 every day prn, and astelin nasal spray, consider allergy referral  Other atopic dermatitis Uncontrolled, also for triam cr prn asd  Hypertension BP Readings from Last 3 Encounters:  11/20/23 120/70  06/04/23 122/76  05/30/23 122/76   Stable, pt to continue medical treatment  - diet ,wt control   Asthma O/w stable, cont inhaler prn  Obesity Pt ok for phentermine  30 mg every day limited rx  Followup: Return if symptoms worsen or fail to improve.  Lynwood Rush, MD 11/20/2023 6:48 PM [facility]

## 2023-11-20 NOTE — Assessment & Plan Note (Signed)
 BP Readings from Last 3 Encounters:  11/20/23 120/70  06/04/23 122/76  05/30/23 122/76   Stable, pt to continue medical treatment  - diet ,wt control

## 2023-11-20 NOTE — Assessment & Plan Note (Signed)
Last vitamin D Lab Results  Component Value Date   VD25OH 9.00 (L) 06/22/2022   Low, to start oral replacement

## 2023-11-26 ENCOUNTER — Other Ambulatory Visit: Payer: Self-pay | Admitting: Internal Medicine

## 2023-11-26 DIAGNOSIS — R928 Other abnormal and inconclusive findings on diagnostic imaging of breast: Secondary | ICD-10-CM

## 2023-12-03 ENCOUNTER — Other Ambulatory Visit: Payer: Self-pay | Admitting: Family

## 2023-12-03 ENCOUNTER — Ambulatory Visit: Payer: PRIVATE HEALTH INSURANCE | Admitting: Internal Medicine

## 2023-12-03 DIAGNOSIS — I2692 Saddle embolus of pulmonary artery without acute cor pulmonale: Secondary | ICD-10-CM

## 2023-12-03 DIAGNOSIS — I82491 Acute embolism and thrombosis of other specified deep vein of right lower extremity: Secondary | ICD-10-CM

## 2023-12-06 ENCOUNTER — Ambulatory Visit
Admission: RE | Admit: 2023-12-06 | Discharge: 2023-12-06 | Disposition: A | Source: Ambulatory Visit | Attending: Internal Medicine | Admitting: Internal Medicine

## 2023-12-06 DIAGNOSIS — R928 Other abnormal and inconclusive findings on diagnostic imaging of breast: Secondary | ICD-10-CM
